# Patient Record
Sex: Female | Born: 1935 | Race: Black or African American | Hispanic: No | Smoking: Never smoker
Health system: Southern US, Community
[De-identification: ages and names within clinical notes are randomized; demographics above are authoritative.]

## PROBLEM LIST (undated history)

## (undated) DIAGNOSIS — E119 Type 2 diabetes mellitus without complications: Secondary | ICD-10-CM

## (undated) DIAGNOSIS — Z9109 Other allergy status, other than to drugs and biological substances: Secondary | ICD-10-CM

## (undated) DIAGNOSIS — I10 Essential (primary) hypertension: Secondary | ICD-10-CM

## (undated) DIAGNOSIS — J45909 Unspecified asthma, uncomplicated: Secondary | ICD-10-CM

## (undated) HISTORY — DX: Unspecified asthma, uncomplicated: J45.909

## (undated) HISTORY — DX: Type 2 diabetes mellitus without complications: E11.9

## (undated) HISTORY — PX: APPENDECTOMY: SHX54

## (undated) HISTORY — PX: TUBAL LIGATION: SHX77

## (undated) HISTORY — DX: Essential (primary) hypertension: I10

## (undated) HISTORY — DX: Other allergy status, other than to drugs and biological substances: Z91.09

---

## 2004-09-16 ENCOUNTER — Ambulatory Visit: Payer: Self-pay | Admitting: Internal Medicine

## 2004-10-09 ENCOUNTER — Ambulatory Visit: Payer: Self-pay | Admitting: Internal Medicine

## 2004-11-09 ENCOUNTER — Ambulatory Visit: Payer: Self-pay | Admitting: Internal Medicine

## 2005-01-21 ENCOUNTER — Ambulatory Visit: Payer: Self-pay | Admitting: Internal Medicine

## 2005-04-16 ENCOUNTER — Ambulatory Visit: Payer: Self-pay | Admitting: Internal Medicine

## 2005-05-11 ENCOUNTER — Ambulatory Visit: Payer: Self-pay | Admitting: Internal Medicine

## 2005-06-11 ENCOUNTER — Ambulatory Visit: Payer: Self-pay | Admitting: Internal Medicine

## 2005-07-11 ENCOUNTER — Ambulatory Visit: Payer: Self-pay | Admitting: Internal Medicine

## 2005-08-11 ENCOUNTER — Ambulatory Visit: Payer: Self-pay | Admitting: Internal Medicine

## 2005-09-11 ENCOUNTER — Ambulatory Visit: Payer: Self-pay | Admitting: Internal Medicine

## 2005-10-09 ENCOUNTER — Ambulatory Visit: Payer: Self-pay | Admitting: Internal Medicine

## 2005-11-09 ENCOUNTER — Ambulatory Visit: Payer: Self-pay | Admitting: Internal Medicine

## 2006-01-21 ENCOUNTER — Ambulatory Visit: Payer: Self-pay | Admitting: Internal Medicine

## 2006-04-22 ENCOUNTER — Ambulatory Visit: Payer: Self-pay | Admitting: Internal Medicine

## 2006-12-15 ENCOUNTER — Ambulatory Visit: Payer: Self-pay | Admitting: Internal Medicine

## 2007-01-27 ENCOUNTER — Ambulatory Visit: Payer: Self-pay | Admitting: Ophthalmology

## 2007-02-02 ENCOUNTER — Ambulatory Visit: Payer: Self-pay | Admitting: Ophthalmology

## 2007-06-24 ENCOUNTER — Ambulatory Visit: Payer: Self-pay | Admitting: Internal Medicine

## 2007-10-13 ENCOUNTER — Other Ambulatory Visit: Payer: Self-pay

## 2007-10-13 ENCOUNTER — Inpatient Hospital Stay: Payer: Self-pay | Admitting: Orthopaedic Surgery

## 2007-10-18 ENCOUNTER — Encounter: Payer: Self-pay | Admitting: Internal Medicine

## 2007-11-10 ENCOUNTER — Encounter: Payer: Self-pay | Admitting: Internal Medicine

## 2008-02-17 ENCOUNTER — Encounter: Payer: Self-pay | Admitting: Internal Medicine

## 2008-03-06 ENCOUNTER — Ambulatory Visit: Payer: Self-pay | Admitting: Vascular Surgery

## 2008-03-11 ENCOUNTER — Encounter: Payer: Self-pay | Admitting: Internal Medicine

## 2008-04-11 ENCOUNTER — Encounter: Payer: Self-pay | Admitting: Internal Medicine

## 2008-07-04 ENCOUNTER — Ambulatory Visit: Payer: Self-pay | Admitting: Internal Medicine

## 2008-10-09 ENCOUNTER — Ambulatory Visit: Payer: Self-pay | Admitting: Internal Medicine

## 2008-11-03 ENCOUNTER — Ambulatory Visit: Payer: Self-pay | Admitting: Internal Medicine

## 2008-11-09 ENCOUNTER — Ambulatory Visit: Payer: Self-pay | Admitting: Internal Medicine

## 2009-08-08 ENCOUNTER — Ambulatory Visit: Payer: Self-pay | Admitting: Internal Medicine

## 2010-03-21 ENCOUNTER — Emergency Department: Payer: Self-pay | Admitting: Emergency Medicine

## 2010-05-07 ENCOUNTER — Ambulatory Visit: Payer: Self-pay | Admitting: Internal Medicine

## 2010-09-06 ENCOUNTER — Ambulatory Visit: Payer: Self-pay | Admitting: Internal Medicine

## 2011-06-17 ENCOUNTER — Ambulatory Visit: Payer: Self-pay | Admitting: Internal Medicine

## 2011-10-23 ENCOUNTER — Ambulatory Visit: Payer: Self-pay | Admitting: Internal Medicine

## 2012-06-11 ENCOUNTER — Ambulatory Visit (INDEPENDENT_AMBULATORY_CARE_PROVIDER_SITE_OTHER): Payer: Medicare Other | Admitting: Internal Medicine

## 2012-06-11 ENCOUNTER — Encounter: Payer: Self-pay | Admitting: Internal Medicine

## 2012-06-11 VITALS — BP 101/62 | HR 68 | Temp 98.1°F | Ht <= 58 in | Wt 106.0 lb

## 2012-06-11 DIAGNOSIS — M79609 Pain in unspecified limb: Secondary | ICD-10-CM

## 2012-06-11 DIAGNOSIS — R269 Unspecified abnormalities of gait and mobility: Secondary | ICD-10-CM

## 2012-06-11 DIAGNOSIS — M79606 Pain in leg, unspecified: Secondary | ICD-10-CM

## 2012-06-11 DIAGNOSIS — R2681 Unsteadiness on feet: Secondary | ICD-10-CM

## 2012-06-11 DIAGNOSIS — E538 Deficiency of other specified B group vitamins: Secondary | ICD-10-CM

## 2012-06-11 DIAGNOSIS — I10 Essential (primary) hypertension: Secondary | ICD-10-CM | POA: Insufficient documentation

## 2012-06-11 DIAGNOSIS — D649 Anemia, unspecified: Secondary | ICD-10-CM

## 2012-06-11 DIAGNOSIS — E039 Hypothyroidism, unspecified: Secondary | ICD-10-CM

## 2012-06-11 DIAGNOSIS — E7800 Pure hypercholesterolemia, unspecified: Secondary | ICD-10-CM | POA: Insufficient documentation

## 2012-06-11 DIAGNOSIS — E1151 Type 2 diabetes mellitus with diabetic peripheral angiopathy without gangrene: Secondary | ICD-10-CM | POA: Insufficient documentation

## 2012-06-11 DIAGNOSIS — E119 Type 2 diabetes mellitus without complications: Secondary | ICD-10-CM

## 2012-06-11 LAB — CBC WITH DIFFERENTIAL/PLATELET
Basophils Absolute: 0 K/uL (ref 0.0–0.1)
Basophils Relative: 0.4 % (ref 0.0–3.0)
Eosinophils Absolute: 0.1 K/uL (ref 0.0–0.7)
Eosinophils Relative: 3.8 % (ref 0.0–5.0)
HCT: 33.8 % — ABNORMAL LOW (ref 36.0–46.0)
Hemoglobin: 10.7 g/dL — ABNORMAL LOW (ref 12.0–15.0)
Lymphocytes Relative: 30.9 % (ref 12.0–46.0)
Lymphs Abs: 1.2 K/uL (ref 0.7–4.0)
MCHC: 31.8 g/dL (ref 30.0–36.0)
MCV: 96.2 fl (ref 78.0–100.0)
Monocytes Absolute: 0.4 K/uL (ref 0.1–1.0)
Monocytes Relative: 9.4 % (ref 3.0–12.0)
Neutro Abs: 2.1 K/uL (ref 1.4–7.7)
Neutrophils Relative %: 55.5 % (ref 43.0–77.0)
Platelets: 186 K/uL (ref 150.0–400.0)
RBC: 3.51 Mil/uL — ABNORMAL LOW (ref 3.87–5.11)
RDW: 15.9 % — ABNORMAL HIGH (ref 11.5–14.6)
WBC: 3.9 K/uL — ABNORMAL LOW (ref 4.5–10.5)

## 2012-06-11 LAB — HEPATIC FUNCTION PANEL
Bilirubin, Direct: 0.1 mg/dL (ref 0.0–0.3)
Total Bilirubin: 0.4 mg/dL (ref 0.3–1.2)

## 2012-06-11 LAB — LIPID PANEL
HDL: 77.1 mg/dL (ref >39.00)
LDL Cholesterol: 104 mg/dL — ABNORMAL HIGH (ref 0–99)
Total CHOL/HDL Ratio: 3
Triglycerides: 97 mg/dL (ref 0.0–149.0)
VLDL: 19.4 mg/dL (ref 0.0–40.0)

## 2012-06-11 LAB — BASIC METABOLIC PANEL
BUN: 26 mg/dL — ABNORMAL HIGH (ref 6–23)
Creatinine, Ser: 0.9 mg/dL (ref 0.4–1.2)
GFR: 80.39 mL/min (ref >60.00)

## 2012-06-11 NOTE — Patient Instructions (Signed)
It was nice seeing you today.  I am going to schedule an appt for you to see Dr Lucky Cowboy and to go to physical therapy.  We will also check labs.  We will notify you of the results once they become available.

## 2012-06-12 ENCOUNTER — Other Ambulatory Visit: Payer: Self-pay | Admitting: Internal Medicine

## 2012-06-12 ENCOUNTER — Encounter: Payer: Self-pay | Admitting: Internal Medicine

## 2012-06-12 DIAGNOSIS — D72819 Decreased white blood cell count, unspecified: Secondary | ICD-10-CM

## 2012-06-12 DIAGNOSIS — D649 Anemia, unspecified: Secondary | ICD-10-CM

## 2012-06-12 NOTE — Progress Notes (Signed)
  Subjective:    Patient ID: Tara [name], female    DOB: [birth year], 78 y.o.   MRN: [id number]  HPI 78 year old female with past history of hypertension, diabetes and anemia who comes in today for a scheduled follow up.  She states she has been doing relatively well.  She has been having problems with increased fatigue in her legs.  Legs feel tired/weak - if on them for a while.  No numbness or tingling.  Also reports an unsteady gait.  Feels her breathing is stable.  No chest pain or tightness. No nausea or vomiting.  Sugars doing well.    Past Medical History  Diagnosis Date  . Asthma   . Diabetes mellitus   . Environmental allergies   . Hypertension     Review of Systems Patient denies any headache, lightheadedness or dizziness.  No increased sinus or allergy symptoms.   No chest pain, tightness or palpitations.  No increased shortness of breath, cough or congestion.  No nausea or vomiting.  No abdominal pain or cramping.  No bowel change, such as diarrhea, constipation, BRBPR or melana.  No urine change.        Objective:   Physical Exam Filed Vitals:   06/11/12 0819  BP: 101/62  Pulse: 68  Temp: 98.1 F (36.7 C)   Blood pressure recheck:  55/29  78 year old female in no acute distress.   HEENT:  Nares - clear.  OP- without lesions or erythema.  NECK:  Supple, nontender.  No audible bruit.   HEART:  Appears to be regular. LUNGS:  Without crackles or wheezing audible.  Respirations even and unlabored.   RADIAL PULSE:  Equal bilaterally.  ABDOMEN:  Soft, nontender.  No audible abdominal bruit.   EXTREMITIES:  No increased edema to be present.  Decreased sensation to pinprick feet (bilaterally) extending up to the mid lower leg.                     Assessment & Plan:  UNSTEADY GAIT.  Probably multifactorial.  With decreased sensation to pinprick c/w neuropathy.  Will refer to physical therapy for evaluation and treatment.

## 2012-06-12 NOTE — Assessment & Plan Note (Signed)
Blood pressure has been under good control.  Same meds.  Check met b.

## 2012-06-12 NOTE — Assessment & Plan Note (Signed)
Has been worked up by hematology.  Desires not to return to the [facility].  Check cbc today to confirm stable.

## 2012-06-12 NOTE — Assessment & Plan Note (Signed)
Low cholesterol diet.  Check lipid profile today.

## 2012-06-12 NOTE — Assessment & Plan Note (Signed)
No problems with lows.  Same meds.  Check met b and a1c.

## 2012-06-12 NOTE — Assessment & Plan Note (Signed)
B12 injection given today.  Continue monthly B12 injections.

## 2012-06-12 NOTE — Assessment & Plan Note (Signed)
Continue thyroid replacement.  Check tsh.

## 2012-06-14 ENCOUNTER — Encounter: Payer: Self-pay | Admitting: *Deleted

## 2012-06-14 NOTE — Progress Notes (Signed)
Result letter mailed to patient

## 2012-07-21 ENCOUNTER — Ambulatory Visit: Payer: Medicare Other

## 2012-07-27 ENCOUNTER — Telehealth: Payer: Self-pay | Admitting: Internal Medicine

## 2012-07-28 NOTE — Telephone Encounter (Signed)
Schedule for B12 

## 2012-08-16 ENCOUNTER — Ambulatory Visit (INDEPENDENT_AMBULATORY_CARE_PROVIDER_SITE_OTHER): Payer: Medicare Other | Admitting: Internal Medicine

## 2012-08-16 DIAGNOSIS — E538 Deficiency of other specified B group vitamins: Secondary | ICD-10-CM

## 2012-08-16 MED ORDER — CYANOCOBALAMIN 1000 MCG/ML IJ SOLN
1000.0000 ug | Freq: Once | INTRAMUSCULAR | Status: AC
  Administered 2012-08-16: 1000 ug via INTRAMUSCULAR

## 2012-08-18 NOTE — Progress Notes (Signed)
  Subjective:    Patient ID: Tara [name], female    DOB: 08-30-1917, 79 y.o.   MRN: [id number]  HPI Pt here for b12 only - was not seen for provider visit.   Review of Systems     Objective:   Physical Exam        Assessment & Plan:

## 2012-08-27 ENCOUNTER — Ambulatory Visit (INDEPENDENT_AMBULATORY_CARE_PROVIDER_SITE_OTHER): Payer: Medicare Other | Admitting: Internal Medicine

## 2012-08-27 ENCOUNTER — Encounter: Payer: Self-pay | Admitting: Internal Medicine

## 2012-08-27 VITALS — BP 162/60 | HR 72 | Temp 98.3°F | Ht <= 58 in | Wt 105.2 lb

## 2012-08-27 DIAGNOSIS — D649 Anemia, unspecified: Secondary | ICD-10-CM

## 2012-08-27 DIAGNOSIS — E119 Type 2 diabetes mellitus without complications: Secondary | ICD-10-CM

## 2012-08-27 DIAGNOSIS — I739 Peripheral vascular disease, unspecified: Secondary | ICD-10-CM

## 2012-08-27 DIAGNOSIS — E538 Deficiency of other specified B group vitamins: Secondary | ICD-10-CM

## 2012-08-27 DIAGNOSIS — I10 Essential (primary) hypertension: Secondary | ICD-10-CM

## 2012-08-27 DIAGNOSIS — E039 Hypothyroidism, unspecified: Secondary | ICD-10-CM

## 2012-08-27 DIAGNOSIS — E7800 Pure hypercholesterolemia, unspecified: Secondary | ICD-10-CM

## 2012-08-27 MED ORDER — METOPROLOL SUCCINATE ER 25 MG PO TB24: 25.0000 mg | ORAL_TABLET | Freq: Every day | ORAL | Status: DC

## 2012-08-27 MED ORDER — FLUTICASONE PROPIONATE 50 MCG/ACT NA SUSP: 2.0000 | Freq: Every day | NASAL | Status: DC

## 2012-08-29 ENCOUNTER — Encounter: Payer: Self-pay | Admitting: Internal Medicine

## 2012-08-29 DIAGNOSIS — I739 Peripheral vascular disease, unspecified: Secondary | ICD-10-CM | POA: Insufficient documentation

## 2012-08-29 NOTE — Assessment & Plan Note (Signed)
Low cholesterol diet.  Check lipid panel.

## 2012-08-29 NOTE — Assessment & Plan Note (Signed)
On synthroid

## 2012-08-29 NOTE — Assessment & Plan Note (Signed)
Blood pressure as outlined.  Same meds.  Follow metabolic panel.

## 2012-08-29 NOTE — Assessment & Plan Note (Signed)
Follow cbc.  Has been stable.

## 2012-08-29 NOTE — Assessment & Plan Note (Signed)
Sugars as outlined.  Check met b and a1c.  Follow.

## 2012-08-29 NOTE — Assessment & Plan Note (Signed)
Continue B12 injections.   

## 2012-08-29 NOTE — Progress Notes (Signed)
Subjective:    Patient ID: Tara [name], female    DOB: 08/26/1917, 79 y.o.   MRN: [id number]  HPI 79 year old female with past history of hypertension, hypercholesterolemia, diabetes mellitus and anemia. She comes in today for a scheduled follow up.  Dong relatively well.  Still having issues with her legs.  Feels as if they are going to give way.  Some discomfort.  Seeing vascular surgery.  Planning for follow up 09/02/12.  States her blood sugar has been averaging 100-130.  Breathing stable.  No significant allergy or sinus symptoms.  Bowels stable.    Past Medical History  Diagnosis Date  . Asthma   . Diabetes mellitus   . Environmental allergies   . Hypertension     Current Outpatient Prescriptions on File Prior to Visit  Medication Sig Dispense Refill  . acetaminophen (TYLENOL) 325 MG tablet Take 650 mg by mouth every 6 (six) hours as needed.      Marland Kitchen amLODipine (NORVASC) 5 MG tablet Take 5 mg by mouth daily.      Marland Kitchen aspirin 81 MG chewable tablet Chew 81 mg by mouth daily.      . Calcium Carbonate-Vitamin D (CALTRATE 600+D PO) Take by mouth.      . Cholecalciferol (D3 SUPER STRENGTH) 2000 UNITS CAPS Take by mouth.      . fexofenadine (ALLEGRA) 180 MG tablet Take 180 mg by mouth daily.      . Fluticasone-Salmeterol (ADVAIR DISKUS) 250-50 MCG/DOSE AEPB Inhale 1 puff into the lungs every 12 (twelve) hours.      . insulin glargine (LANTUS) 100 UNIT/ML injection Inject 6 Units into the skin daily.      Marland Kitchen levothyroxine (SYNTHROID, LEVOTHROID) 50 MCG tablet Take 50 mcg by mouth daily.      . metoprolol succinate (TOPROL-XL) 25 MG 24 hr tablet Take 1 tablet (25 mg total) by mouth daily.  30 tablet  5  . pioglitazone (ACTOS) 45 MG tablet Take 45 mg by mouth daily.      . fluticasone (FLONASE) 50 MCG/ACT nasal spray Place 2 sprays into the nose daily.  16 g  4    Review of Systems Patient denies any headache, lightheadedness or dizziness.  No significant sinus or allergy symptoms.  No chest  pain, tightness or palpitations.  No increased shortness of breath, cough or congestion.  Breathing stable.  Using her inhalers regularly.  No nausea or vomiting.  No abdominal pain or cramping.  No bowel change, such as diarrhea, constipation, BRBPR or melana.  No urine change.   Leg symptoms as outlined.  We discussed using a cane or walker.       Objective:   Physical Exam Filed Vitals:   08/27/12 1103  BP: 162/60  Pulse: 72  Temp: 98.3 F (36.8 C)   Blood pressure recheck:  52/2  79 year old female in no acute distress.   HEENT:  Nares - clear.  OP- without lesions or erythema.  NECK:  Supple, nontender.  No audible bruit.   HEART:  Appears to be regular. LUNGS:  Without crackles or wheezing audible.  Respirations even and unlabored.   RADIAL PULSE:  Equal bilaterally.  ABDOMEN:  Soft, nontender.  No audible abdominal bruit.   EXTREMITIES:  No increased edema to be present.  Diminished/absent pulse - DP.                  Assessment & Plan:  HEALTH MAINTENANCE.  Obtain records from outside.  Review.  Keep up to date.

## 2012-08-29 NOTE — Assessment & Plan Note (Signed)
Planning to see vascular surgery in follow up on 09/02/12.

## 2012-09-14 ENCOUNTER — Other Ambulatory Visit: Payer: Self-pay | Admitting: *Deleted

## 2012-09-14 MED ORDER — LEVOTHYROXINE SODIUM 50 MCG PO TABS: 50.0000 ug | ORAL_TABLET | Freq: Every day | ORAL | Status: DC

## 2012-10-11 ENCOUNTER — Ambulatory Visit: Payer: Medicare Other | Admitting: Internal Medicine

## 2012-10-15 ENCOUNTER — Ambulatory Visit: Payer: Medicare Other | Admitting: Internal Medicine

## 2012-10-19 ENCOUNTER — Encounter: Payer: Self-pay | Admitting: Internal Medicine

## 2012-10-19 ENCOUNTER — Ambulatory Visit (INDEPENDENT_AMBULATORY_CARE_PROVIDER_SITE_OTHER): Payer: Medicare Other | Admitting: Internal Medicine

## 2012-10-19 VITALS — BP 140/60 | HR 74 | Temp 97.9°F | Ht <= 58 in | Wt 106.5 lb

## 2012-10-19 DIAGNOSIS — I10 Essential (primary) hypertension: Secondary | ICD-10-CM

## 2012-10-19 DIAGNOSIS — E538 Deficiency of other specified B group vitamins: Secondary | ICD-10-CM

## 2012-10-19 DIAGNOSIS — E039 Hypothyroidism, unspecified: Secondary | ICD-10-CM

## 2012-10-19 DIAGNOSIS — E7800 Pure hypercholesterolemia, unspecified: Secondary | ICD-10-CM

## 2012-10-19 LAB — BASIC METABOLIC PANEL
BUN: 26 mg/dL — ABNORMAL HIGH (ref 6–23)
CO2: 27 mEq/L (ref 19–32)
Chloride: 106 mEq/L (ref 96–112)
Creatinine, Ser: 0.9 mg/dL (ref 0.4–1.2)
Glucose, Bld: 119 mg/dL — ABNORMAL HIGH (ref 70–99)

## 2012-10-19 LAB — FERRITIN: Ferritin: 100.9 ng/mL (ref 10.0–291.0)

## 2012-10-19 LAB — CBC WITH DIFFERENTIAL/PLATELET
Basophils Absolute: 0 10*3/uL (ref 0.0–0.1)
Eosinophils Absolute: 0.2 10*3/uL (ref 0.0–0.7)
Lymphocytes Relative: 42.2 % (ref 12.0–46.0)
MCHC: 33.3 g/dL (ref 30.0–36.0)
Neutrophils Relative %: 44.7 % (ref 43.0–77.0)
RDW: 16.4 % — ABNORMAL HIGH (ref 11.5–14.6)

## 2012-10-19 LAB — HEPATIC FUNCTION PANEL
ALT: 16 U/L (ref 0–35)
Bilirubin, Direct: 0 mg/dL (ref 0.0–0.3)
Total Protein: 7.1 g/dL (ref 6.0–8.3)

## 2012-10-19 LAB — LIPID PANEL
Cholesterol: 205 mg/dL — ABNORMAL HIGH (ref 0–200)
Total CHOL/HDL Ratio: 2

## 2012-10-19 LAB — HEMOGLOBIN A1C: Hgb A1c MFr Bld: 7.6 % — ABNORMAL HIGH (ref 4.6–6.5)

## 2012-10-19 LAB — LDL CHOLESTEROL, DIRECT: Direct LDL: 87.3 mg/dL

## 2012-11-03 ENCOUNTER — Ambulatory Visit (INDEPENDENT_AMBULATORY_CARE_PROVIDER_SITE_OTHER): Payer: Medicare Other | Admitting: *Deleted

## 2012-11-03 DIAGNOSIS — D649 Anemia, unspecified: Secondary | ICD-10-CM

## 2012-11-03 MED ORDER — CYANOCOBALAMIN 1000 MCG/ML IJ SOLN
1000.0000 ug | Freq: Once | INTRAMUSCULAR | Status: AC
  Administered 2012-11-03: 1000 ug via INTRAMUSCULAR

## 2012-11-15 ENCOUNTER — Other Ambulatory Visit: Payer: Self-pay | Admitting: *Deleted

## 2012-11-16 MED ORDER — PIOGLITAZONE HCL 45 MG PO TABS: 45.0000 mg | ORAL_TABLET | Freq: Every day | ORAL | Status: DC

## 2012-11-16 NOTE — Telephone Encounter (Signed)
Rx sent in to pharmacy. 

## 2012-11-23 ENCOUNTER — Other Ambulatory Visit: Payer: Self-pay | Admitting: *Deleted

## 2012-11-23 MED ORDER — INSULIN GLARGINE 100 UNIT/ML ~~LOC~~ SOLN: 6.0000 [IU] | Freq: Every day | SUBCUTANEOUS | Status: DC

## 2012-11-23 MED ORDER — AMLODIPINE BESYLATE 5 MG PO TABS: 5.0000 mg | ORAL_TABLET | Freq: Every day | ORAL | Status: DC

## 2012-11-26 ENCOUNTER — Other Ambulatory Visit: Payer: Self-pay | Admitting: Internal Medicine

## 2012-11-26 MED ORDER — AMLODIPINE BESYLATE 5 MG PO TABS: 5.0000 mg | ORAL_TABLET | Freq: Two times a day (BID) | ORAL | Status: DC

## 2012-11-26 NOTE — Progress Notes (Signed)
Sent in rx for clarification on amlodipine 5mg  bid #60 with 5 refills.

## 2012-11-28 ENCOUNTER — Encounter: Payer: Self-pay | Admitting: Internal Medicine

## 2012-11-28 NOTE — Assessment & Plan Note (Signed)
Follow cbc.  Has been stable.  Check iron studies.  She declines any further w/up or evaluation.

## 2012-11-28 NOTE — Assessment & Plan Note (Signed)
On synthroid.  Follow tsh.   

## 2012-11-28 NOTE — Progress Notes (Signed)
  Subjective:    Patient ID: Tara [name], female    DOB: 23-Sep-1917, 79 y.o.   MRN: [id number]  HPI 79 year old female with past history of hypertension, hypercholesterolemia, diabetes mellitus and anemia. She comes in today for a scheduled follow up.  Dong relatively well.  Seeing vascular surgery for her legs.  Stable.   States her blood sugar has been averaging 100-130 in the am.  Does not check in the pm.  Breathing stable.  No significant allergy or sinus symptoms.  Bowels stable.     Past Medical History  Diagnosis Date  . Asthma   . Diabetes mellitus   . Environmental allergies   . Hypertension     Current Outpatient Prescriptions on File Prior to Visit  Medication Sig Dispense Refill  . acetaminophen (TYLENOL) 325 MG tablet Take 650 mg by mouth every 6 (six) hours as needed.      Marland Kitchen aspirin 81 MG chewable tablet Chew 81 mg by mouth daily.      . Calcium Carbonate-Vitamin D (CALTRATE 600+D PO) Take by mouth.      . Cholecalciferol (D3 SUPER STRENGTH) 2000 UNITS CAPS Take by mouth.      . fexofenadine (ALLEGRA) 180 MG tablet Take 180 mg by mouth daily.      . fluticasone (FLONASE) 50 MCG/ACT nasal spray Place 2 sprays into the nose daily.  16 g  4  . Fluticasone-Salmeterol (ADVAIR DISKUS) 250-50 MCG/DOSE AEPB Inhale 1 puff into the lungs every 12 (twelve) hours.      Marland Kitchen levothyroxine (SYNTHROID, LEVOTHROID) 50 MCG tablet Take 1 tablet (50 mcg total) by mouth daily.  30 tablet  5  . metoprolol succinate (TOPROL-XL) 25 MG 24 hr tablet Take 1 tablet (25 mg total) by mouth daily.  30 tablet  5   No current facility-administered medications on file prior to visit.    Review of Systems Patient denies any headache, lightheadedness or dizziness.  No significant sinus or allergy symptoms.  No chest pain, tightness or palpitations.  No increased shortness of breath, cough or congestion.  Breathing stable.  Using her inhalers regularly.  No nausea or vomiting.  No abdominal pain or cramping.   No bowel change, such as diarrhea, constipation, BRBPR or melana.  No urine change.          Objective:   Physical Exam  Filed Vitals:   10/19/12 0922  BP: 140/60  Pulse: 74  Temp: 97.9 F (27.10 C)   79 year old female in no acute distress.   HEENT:  Nares - clear.  OP- without lesions or erythema.  NECK:  Supple, nontender.  No audible bruit.   HEART:  Appears to be regular. LUNGS:  Without crackles or wheezing audible.  Respirations even and unlabored.   RADIAL PULSE:  Equal bilaterally.  ABDOMEN:  Soft, nontender.  No audible abdominal bruit.   EXTREMITIES:  No increased edema to be present.  Diminished/absent pulse - DP.                  Assessment & Plan:  HEALTH MAINTENANCE.  Obtain records from outside.  Review.  Keep up to date.  Schedule a physical next visit.

## 2012-11-28 NOTE — Assessment & Plan Note (Signed)
Low cholesterol diet.  Check lipid panel.

## 2012-11-28 NOTE — Assessment & Plan Note (Signed)
Blood pressure as outlined.  Same meds.  Follow metabolic panel.

## 2012-11-28 NOTE — Assessment & Plan Note (Signed)
Sugars as outlined.  Check met b and a1c.  Follow.

## 2012-11-28 NOTE — Assessment & Plan Note (Signed)
Being followed by vascular surgery.  Stable.

## 2012-11-28 NOTE — Assessment & Plan Note (Signed)
Continue B12 injections.   

## 2012-12-06 ENCOUNTER — Other Ambulatory Visit: Payer: Self-pay | Admitting: Internal Medicine

## 2012-12-06 ENCOUNTER — Encounter: Payer: Self-pay | Admitting: Internal Medicine

## 2012-12-06 MED ORDER — GLUCOSE BLOOD VI STRP: ORAL_STRIP | Status: DC

## 2012-12-06 NOTE — Progress Notes (Signed)
Opened in error

## 2012-12-06 NOTE — Progress Notes (Signed)
Refilled glucose test strips #50 with 12 refills.

## 2012-12-07 ENCOUNTER — Telehealth: Payer: Self-pay | Admitting: *Deleted

## 2012-12-07 NOTE — Telephone Encounter (Signed)
RX refill was sent via fax this morning for Accu-check test strips

## 2012-12-07 NOTE — Telephone Encounter (Signed)
Refill Request  Accu-Chek Aviva Plus Tes  #50   As directed

## 2012-12-08 ENCOUNTER — Ambulatory Visit (INDEPENDENT_AMBULATORY_CARE_PROVIDER_SITE_OTHER): Payer: Medicare Other | Admitting: *Deleted

## 2012-12-08 DIAGNOSIS — E538 Deficiency of other specified B group vitamins: Secondary | ICD-10-CM

## 2012-12-08 MED ORDER — CYANOCOBALAMIN 1000 MCG/ML IJ SOLN
1000.0000 ug | Freq: Once | INTRAMUSCULAR | Status: AC
  Administered 2012-12-08: 1000 ug via INTRAMUSCULAR

## 2012-12-30 ENCOUNTER — Ambulatory Visit (INDEPENDENT_AMBULATORY_CARE_PROVIDER_SITE_OTHER): Payer: Medicare Other | Admitting: *Deleted

## 2012-12-30 DIAGNOSIS — E538 Deficiency of other specified B group vitamins: Secondary | ICD-10-CM

## 2012-12-30 MED ORDER — CYANOCOBALAMIN 1000 MCG/ML IJ SOLN
1000.0000 ug | Freq: Once | INTRAMUSCULAR | Status: AC
  Administered 2012-12-30: 1000 ug via INTRAMUSCULAR

## 2013-01-25 ENCOUNTER — Encounter: Payer: Self-pay | Admitting: Internal Medicine

## 2013-01-25 ENCOUNTER — Ambulatory Visit (INDEPENDENT_AMBULATORY_CARE_PROVIDER_SITE_OTHER): Payer: Medicare Other | Admitting: Internal Medicine

## 2013-01-25 VITALS — BP 110/50 | HR 68 | Temp 98.3°F | Ht <= 58 in | Wt 101.0 lb

## 2013-01-25 DIAGNOSIS — D649 Anemia, unspecified: Secondary | ICD-10-CM

## 2013-01-25 DIAGNOSIS — I739 Peripheral vascular disease, unspecified: Secondary | ICD-10-CM

## 2013-01-25 DIAGNOSIS — E119 Type 2 diabetes mellitus without complications: Secondary | ICD-10-CM

## 2013-01-25 DIAGNOSIS — R238 Other skin changes: Secondary | ICD-10-CM

## 2013-01-25 DIAGNOSIS — I10 Essential (primary) hypertension: Secondary | ICD-10-CM

## 2013-01-25 LAB — CBC WITH DIFFERENTIAL/PLATELET
Basophils Relative: 0.3 % (ref 0.0–3.0)
Eosinophils Relative: 2.9 % (ref 0.0–5.0)
Lymphocytes Relative: 27.8 % (ref 12.0–46.0)
MCV: 95.3 fl (ref 78.0–100.0)
Monocytes Absolute: 0.3 10*3/uL (ref 0.1–1.0)
Monocytes Relative: 7.1 % (ref 3.0–12.0)
Neutrophils Relative %: 61.9 % (ref 43.0–77.0)
Platelets: 172 10*3/uL (ref 150.0–400.0)
RBC: 3.49 Mil/uL — ABNORMAL LOW (ref 3.87–5.11)
WBC: 4.4 10*3/uL — ABNORMAL LOW (ref 4.5–10.5)

## 2013-01-25 NOTE — Assessment & Plan Note (Signed)
Blood pressure as outlined.  Same meds.  Follow metabolic panel.

## 2013-01-25 NOTE — Assessment & Plan Note (Signed)
Being followed by vascular surgery.  Stable.

## 2013-01-25 NOTE — Assessment & Plan Note (Signed)
Recheck cbc.  With the easing bruising, will check cbc, PT and PTT.

## 2013-01-25 NOTE — Progress Notes (Signed)
Subjective:    Patient ID: Tara [name], female    DOB: 05-31-1918, 79 y.o.   MRN: [id number]  Arm Injury   79 year old female with past history of hypertension, hypercholesterolemia, diabetes mellitus and anemia. She comes in today as a work in with concerns regarding some nodules and bruising she noted on her arms.   States she noticed (on her right arm) some isolated areas of fullness and then bruising with residual nodule.  No pain.  No know injury or trauma.  She noticed this am, an isolated raised area on her left forearm.  Seeing vascular surgery for her legs.  Stable.   States her blood sugar has been doing well.   Does not check in the pm.  Breathing stable.  No significant allergy or sinus symptoms.    Past Medical History  Diagnosis Date  . Asthma   . Diabetes mellitus   . Environmental allergies   . Hypertension     Current Outpatient Prescriptions on File Prior to Visit  Medication Sig Dispense Refill  . acetaminophen (TYLENOL) 325 MG tablet Take 650 mg by mouth every 6 (six) hours as needed.      Marland Kitchen amLODipine (NORVASC) 5 MG tablet Take 1 tablet (5 mg total) by mouth 2 (two) times daily.  60 tablet  5  . aspirin 81 MG chewable tablet Chew 81 mg by mouth daily.      . Calcium Carbonate-Vitamin D (CALTRATE 600+D PO) Take by mouth.      . Cholecalciferol (D3 SUPER STRENGTH) 2000 UNITS CAPS Take by mouth.      . fexofenadine (ALLEGRA) 180 MG tablet Take 180 mg by mouth daily.      . fluticasone (FLONASE) 50 MCG/ACT nasal spray Place 2 sprays into the nose daily.  16 g  4  . Fluticasone-Salmeterol (ADVAIR DISKUS) 250-50 MCG/DOSE AEPB Inhale 1 puff into the lungs every 12 (twelve) hours.      Marland Kitchen glucose blood test strip ACCU-CHEK AVIVA PLUS Check blood sugar q day Dx 250.02  50 each  12  . insulin glargine (LANTUS) 100 UNIT/ML injection Inject 0.06 mLs (6 Units total) into the skin daily.  10 mL  5  . levothyroxine (SYNTHROID, LEVOTHROID) 50 MCG tablet Take 1 tablet (50 mcg total)  by mouth daily.  30 tablet  5  . metoprolol succinate (TOPROL-XL) 25 MG 24 hr tablet Take 1 tablet (25 mg total) by mouth daily.  30 tablet  5  . pioglitazone (ACTOS) 45 MG tablet Take 1 tablet (45 mg total) by mouth daily.  30 tablet  5   No current facility-administered medications on file prior to visit.    Review of Systems Patient denies any headache, lightheadedness or dizziness.  No significant sinus or allergy symptoms.  No chest pain, tightness or palpitations.  No increased shortness of breath, cough or congestion.  Breathing stable.  No known injury or trauma to her arms.  No pain.  States the lesions on her right arm looked like the raised ?varicosity on the left - before the bruising.        Objective:   Physical Exam  Filed Vitals:   01/25/13 1043  BP: 110/50  Pulse: 68  Temp: 98.3 F (42.72 C)   79 year old female in no acute distress.   NECK:  Supple, nontender.     HEART:  Appears to be regular. LUNGS:  Without crackles or wheezing audible.  Respirations even and unlabored.   RADIAL  PULSE:  Equal bilaterally.   EXTREMITIES:  Right forearm -  Two raised bruised lesions (question of hematoma).  Question of varicosity - left fore arm.  Non tender.  No bruising - left.                  Assessment & Plan:  QUESTION OF VARICOSITY.  Exam as outlined.  No pain.  Will have vascular surgery evaluate.

## 2013-01-25 NOTE — Assessment & Plan Note (Signed)
Sugars as outlined.  Follow met b and a1c.  

## 2013-02-07 ENCOUNTER — Ambulatory Visit (INDEPENDENT_AMBULATORY_CARE_PROVIDER_SITE_OTHER): Payer: Medicare Other | Admitting: *Deleted

## 2013-02-07 DIAGNOSIS — E538 Deficiency of other specified B group vitamins: Secondary | ICD-10-CM

## 2013-02-07 MED ORDER — CYANOCOBALAMIN 1000 MCG/ML IJ SOLN
1000.0000 ug | Freq: Once | INTRAMUSCULAR | Status: AC
  Administered 2013-02-07: 1000 ug via INTRAMUSCULAR

## 2013-03-08 ENCOUNTER — Ambulatory Visit (INDEPENDENT_AMBULATORY_CARE_PROVIDER_SITE_OTHER): Payer: Medicare Other | Admitting: *Deleted

## 2013-03-08 DIAGNOSIS — E538 Deficiency of other specified B group vitamins: Secondary | ICD-10-CM

## 2013-03-08 MED ORDER — CYANOCOBALAMIN 1000 MCG/ML IJ SOLN
1000.0000 ug | Freq: Once | INTRAMUSCULAR | Status: AC
  Administered 2013-03-08: 1000 ug via INTRAMUSCULAR

## 2013-03-21 ENCOUNTER — Other Ambulatory Visit: Payer: Self-pay | Admitting: *Deleted

## 2013-03-21 MED ORDER — LEVOTHYROXINE SODIUM 50 MCG PO TABS: 50.0000 ug | ORAL_TABLET | Freq: Every day | ORAL | Status: DC

## 2013-03-23 ENCOUNTER — Telehealth: Payer: Self-pay | Admitting: *Deleted

## 2013-03-23 MED ORDER — GLUCOSE BLOOD VI STRP: ORAL_STRIP | Status: DC

## 2013-03-23 NOTE — Telephone Encounter (Signed)
done

## 2013-03-23 NOTE — Telephone Encounter (Signed)
Refill request  Accu-check Aviva plus test  Use daily as directed

## 2013-03-29 ENCOUNTER — Ambulatory Visit (INDEPENDENT_AMBULATORY_CARE_PROVIDER_SITE_OTHER): Payer: Medicare Other | Admitting: Internal Medicine

## 2013-03-29 ENCOUNTER — Encounter: Payer: Self-pay | Admitting: Internal Medicine

## 2013-03-29 VITALS — BP 110/70 | HR 68 | Temp 97.7°F | Ht <= 58 in | Wt 105.5 lb

## 2013-03-29 DIAGNOSIS — E039 Hypothyroidism, unspecified: Secondary | ICD-10-CM

## 2013-03-29 DIAGNOSIS — E119 Type 2 diabetes mellitus without complications: Secondary | ICD-10-CM

## 2013-03-29 DIAGNOSIS — D649 Anemia, unspecified: Secondary | ICD-10-CM

## 2013-03-29 DIAGNOSIS — E538 Deficiency of other specified B group vitamins: Secondary | ICD-10-CM

## 2013-03-29 DIAGNOSIS — E7800 Pure hypercholesterolemia, unspecified: Secondary | ICD-10-CM

## 2013-03-29 DIAGNOSIS — I739 Peripheral vascular disease, unspecified: Secondary | ICD-10-CM

## 2013-03-29 DIAGNOSIS — I10 Essential (primary) hypertension: Secondary | ICD-10-CM

## 2013-03-31 ENCOUNTER — Encounter: Payer: Self-pay | Admitting: Internal Medicine

## 2013-03-31 NOTE — Assessment & Plan Note (Signed)
Follow cbc.  

## 2013-03-31 NOTE — Assessment & Plan Note (Signed)
On synthroid.  Follow tsh.   

## 2013-03-31 NOTE — Progress Notes (Signed)
Subjective:    Patient ID: Tara [name], female    DOB: 04-21-1918, 79 y.o.   MRN: [id number]  HPI 79 year old female with past history of hypertension, hypercholesterolemia, diabetes mellitus and anemia. She comes in today to follow up on these issues as well as for a complete physical exam. Doing relatively well.  Seeing vascular surgery for her legs.  We discussed the possibility of another procedure.  States her blood sugar has been averaging 80s-130s in the am.  Does not check in the pm.  Breathing stable.  No significant allergy or sinus symptoms.  Bowels stable.     Past Medical History  Diagnosis Date  . Asthma   . Diabetes mellitus   . Environmental allergies   . Hypertension     Current Outpatient Prescriptions on File Prior to Visit  Medication Sig Dispense Refill  . acetaminophen (TYLENOL) 325 MG tablet Take 650 mg by mouth every 6 (six) hours as needed.      Marland Kitchen amLODipine (NORVASC) 5 MG tablet Take 1 tablet (5 mg total) by mouth 2 (two) times daily.  60 tablet  5  . aspirin 81 MG chewable tablet Chew 81 mg by mouth daily.      . Calcium Carbonate-Vitamin D (CALTRATE 600+D PO) Take by mouth.      . Cholecalciferol (D3 SUPER STRENGTH) 2000 UNITS CAPS Take by mouth.      . fexofenadine (ALLEGRA) 180 MG tablet Take 180 mg by mouth daily.      . fluticasone (FLONASE) 50 MCG/ACT nasal spray Place 2 sprays into the nose daily.  16 g  4  . glucose blood test strip ACCU-CHEK AVIVA PLUS-Check blood sugar q day (Dx 250.02)  50 each  12  . insulin glargine (LANTUS) 100 UNIT/ML injection Inject 0.06 mLs (6 Units total) into the skin daily.  10 mL  5  . levothyroxine (SYNTHROID, LEVOTHROID) 50 MCG tablet Take 1 tablet (50 mcg total) by mouth daily.  30 tablet  5  . metoprolol succinate (TOPROL-XL) 25 MG 24 hr tablet Take 1 tablet (25 mg total) by mouth daily.  30 tablet  5  . pioglitazone (ACTOS) 45 MG tablet Take 1 tablet (45 mg total) by mouth daily.  30 tablet  5   No current  facility-administered medications on file prior to visit.    Review of Systems Patient denies any headache, lightheadedness or dizziness.  No significant sinus or allergy symptoms.  No chest pain, tightness or palpitations.  No increased shortness of breath, cough or congestion.  Breathing stable.  Using her inhalers now as needed.  No nausea or vomiting.  No abdominal pain or cramping.  No bowel change, such as diarrhea, constipation, BRBPR or melana.  No urine change.   Sugars attached.         Objective:   Physical Exam  Filed Vitals:   03/29/13 1614  BP: 110/70  Pulse: 68  Temp: 97.7 F (36.5 C)   Blood pressure recheck:  31/63  79 year old female in no acute distress.   HEENT:  Nares- clear.  Oropharynx - without lesions. NECK:  Supple.  Nontender.  No audible bruit.  HEART:  Appears to be regular. LUNGS:  No crackles or wheezing audible.  Respirations even and unlabored.  RADIAL PULSE:  Equal bilaterally.    BREASTS:  No nipple discharge or nipple retraction present.  Could not appreciate any distinct nodules or axillary adenopathy.  ABDOMEN:  Soft, nontender.  Bowel sounds  present and normal.  No audible abdominal bruit.  GU: not performed.    EXTREMITIES:  No increased edema present.  DP pulses palpable and equal bilaterally.   FEET:  Without lesions.                  Assessment & Plan:  HEALTH MAINTENANCE.  Physical today.  Declines mammograms.  Desires no further testing.

## 2013-03-31 NOTE — Assessment & Plan Note (Signed)
Blood pressure as outlined.  Same meds.  Follow metabolic panel.

## 2013-03-31 NOTE — Assessment & Plan Note (Signed)
Continue B12 injections.   

## 2013-03-31 NOTE — Assessment & Plan Note (Signed)
Being followed by vascular surgery.  Discussed possibility of upcoming surgery/procedure.  Vascular is wanting to do another intervention.  Follow.  She will let me know what she decides.

## 2013-03-31 NOTE — Assessment & Plan Note (Signed)
Low cholesterol diet.  Follow lipid panel.    

## 2013-03-31 NOTE — Assessment & Plan Note (Signed)
Sugars as outlined.  Follow met b and a1c.  

## 2013-04-12 ENCOUNTER — Ambulatory Visit (INDEPENDENT_AMBULATORY_CARE_PROVIDER_SITE_OTHER): Payer: Medicare Other | Admitting: *Deleted

## 2013-04-12 DIAGNOSIS — E538 Deficiency of other specified B group vitamins: Secondary | ICD-10-CM

## 2013-04-12 MED ORDER — CYANOCOBALAMIN 1000 MCG/ML IJ SOLN
1000.0000 ug | Freq: Once | INTRAMUSCULAR | Status: AC
  Administered 2013-04-12: 1000 ug via INTRAMUSCULAR

## 2013-05-02 ENCOUNTER — Emergency Department: Payer: Self-pay | Admitting: Emergency Medicine

## 2013-05-05 ENCOUNTER — Telehealth: Payer: Self-pay | Admitting: *Deleted

## 2013-05-05 NOTE — Telephone Encounter (Signed)
If pt has had a hip fracture, I do want her to be followed by ortho (even if no surgery warranted).  Did they give her anything for pain in the ER.  I would recommend tylenol ES 2 tablets tid for now.  If agreeable for referral, let me know and I will place order.

## 2013-05-05 NOTE — Telephone Encounter (Signed)
Pt called to report that she fell recently and broke her hip (Records requested). She wanted to know if she could get some medication for pain. She was told at [facility] that she just needs to stay off of it & rest.

## 2013-05-06 ENCOUNTER — Telehealth: Payer: Self-pay | Admitting: Internal Medicine

## 2013-05-06 NOTE — Telephone Encounter (Signed)
Pt called back to let me know that the ER did not give her any pain meds. I instructed her to Tylenol ES 2 tab TID. Pt also willing to proceed with Ortho referral. (Copy of records placed in Ambers basket)

## 2013-05-06 NOTE — Telephone Encounter (Signed)
Fractured pelvis, ER visit Monday.  Asking if she can get any pain medication.  States was not prescribed any medication from [facility].

## 2013-05-06 NOTE — Telephone Encounter (Signed)
Patient daughter and patient are aware of the apt.

## 2013-05-06 NOTE — Telephone Encounter (Signed)
Patient has an apt with [facility] on 05/09/13 @ 130 pm. Arriving @ 115.

## 2013-05-06 NOTE — Telephone Encounter (Signed)
See other message

## 2013-05-07 ENCOUNTER — Emergency Department: Payer: Self-pay | Admitting: Emergency Medicine

## 2013-05-07 LAB — CBC
HCT: 34.1 % — ABNORMAL LOW (ref 35.0–47.0)
HGB: 11.8 g/dL — ABNORMAL LOW (ref 12.0–16.0)
MCH: 32.2 pg (ref 26.0–34.0)
MCHC: 34.5 g/dL (ref 32.0–36.0)
MCV: 93 fL (ref 80–100)
RDW: 15.9 % — ABNORMAL HIGH (ref 11.5–14.5)

## 2013-05-07 LAB — COMPREHENSIVE METABOLIC PANEL
Albumin: 3.2 g/dL — ABNORMAL LOW (ref 3.4–5.0)
Alkaline Phosphatase: 45 U/L — ABNORMAL LOW (ref 50–136)
BUN: 35 mg/dL — ABNORMAL HIGH (ref 7–18)
Bilirubin,Total: 0.2 mg/dL (ref 0.2–1.0)
Calcium, Total: 9.8 mg/dL (ref 8.5–10.1)
Chloride: 108 mmol/L — ABNORMAL HIGH (ref 98–107)
Co2: 27 mmol/L (ref 21–32)
EGFR (African American): 52 — ABNORMAL LOW
Glucose: 140 mg/dL — ABNORMAL HIGH (ref 65–99)
Osmolality: 290 (ref 275–301)
SGOT(AST): 25 U/L (ref 15–37)
SGPT (ALT): 19 U/L (ref 12–78)
Total Protein: 7.8 g/dL (ref 6.4–8.2)

## 2013-05-12 ENCOUNTER — Ambulatory Visit: Payer: Self-pay | Admitting: Internal Medicine

## 2013-05-12 ENCOUNTER — Ambulatory Visit (INDEPENDENT_AMBULATORY_CARE_PROVIDER_SITE_OTHER): Payer: Medicare Other | Admitting: Internal Medicine

## 2013-05-12 ENCOUNTER — Encounter: Payer: Self-pay | Admitting: Internal Medicine

## 2013-05-12 ENCOUNTER — Telehealth: Payer: Self-pay | Admitting: *Deleted

## 2013-05-12 ENCOUNTER — Ambulatory Visit (INDEPENDENT_AMBULATORY_CARE_PROVIDER_SITE_OTHER): Payer: Medicare Other | Admitting: *Deleted

## 2013-05-12 VITALS — BP 110/70 | HR 72 | Temp 97.8°F | Ht <= 58 in | Wt 106.0 lb

## 2013-05-12 DIAGNOSIS — Z23 Encounter for immunization: Secondary | ICD-10-CM

## 2013-05-12 DIAGNOSIS — I739 Peripheral vascular disease, unspecified: Secondary | ICD-10-CM

## 2013-05-12 DIAGNOSIS — E119 Type 2 diabetes mellitus without complications: Secondary | ICD-10-CM

## 2013-05-12 DIAGNOSIS — E538 Deficiency of other specified B group vitamins: Secondary | ICD-10-CM

## 2013-05-12 DIAGNOSIS — R600 Localized edema: Secondary | ICD-10-CM

## 2013-05-12 DIAGNOSIS — R609 Edema, unspecified: Secondary | ICD-10-CM

## 2013-05-12 DIAGNOSIS — I10 Essential (primary) hypertension: Secondary | ICD-10-CM

## 2013-05-12 MED ORDER — CYANOCOBALAMIN 1000 MCG/ML IJ SOLN
1000.0000 ug | Freq: Once | INTRAMUSCULAR | Status: AC
  Administered 2013-05-12: 1000 ug via INTRAMUSCULAR

## 2013-05-12 NOTE — Telephone Encounter (Signed)
Pt's daughter notified of ultrasound negative.  Elevate legs and compression hose as discussed at appt.

## 2013-05-12 NOTE — Telephone Encounter (Signed)
Ultrasound: Negative for DVT of the left leg (pt was released)

## 2013-05-15 ENCOUNTER — Encounter: Payer: Self-pay | Admitting: Internal Medicine

## 2013-05-15 DIAGNOSIS — R600 Localized edema: Secondary | ICD-10-CM | POA: Insufficient documentation

## 2013-05-15 NOTE — Progress Notes (Signed)
Subjective:    Patient ID: Tara [name], female    DOB: Mar 22, 1918, 79 y.o.   MRN: [id number]  HPI 79 year old female with past history of hypertension, hypercholesterolemia, diabetes mellitus and anemia. She comes in today as a work in.   States that approximately 10 days ago she fell.  Hit her head and subsequently found to have a pelvic fracture.  Had head scan - negative.  Has seen ortho.  Using a walker.  Is some better.  Seeing vascular surgery for her legs.   Brought in recorded sugar readings and am sugars averaging 85-130.  Does not check in the pm.  Breathing stable.  No significant allergy or sinus symptoms.  Bowels stable.  She does report some lower extremity swelling.  Left leg worse than right.     Past Medical History  Diagnosis Date  . Asthma   . Diabetes mellitus   . Environmental allergies   . Hypertension     Current Outpatient Prescriptions on File Prior to Visit  Medication Sig Dispense Refill  . acetaminophen (TYLENOL) 325 MG tablet Take 650 mg by mouth every 6 (six) hours as needed.      Marland Kitchen amLODipine (NORVASC) 5 MG tablet Take 1 tablet (5 mg total) by mouth 2 (two) times daily.  60 tablet  5  . aspirin 81 MG chewable tablet Chew 81 mg by mouth daily.      . Calcium Carbonate-Vitamin D (CALTRATE 600+D PO) Take by mouth.      . Cholecalciferol (D3 SUPER STRENGTH) 2000 UNITS CAPS Take by mouth.      . fexofenadine (ALLEGRA) 180 MG tablet Take 180 mg by mouth daily.      . fluticasone (FLONASE) 50 MCG/ACT nasal spray Place 2 sprays into the nose daily.  16 g  4  . glucose blood test strip ACCU-CHEK AVIVA PLUS-Check blood sugar q day (Dx 250.02)  50 each  12  . insulin glargine (LANTUS) 100 UNIT/ML injection Inject 0.06 mLs (6 Units total) into the skin daily.  10 mL  5  . levothyroxine (SYNTHROID, LEVOTHROID) 50 MCG tablet Take 1 tablet (50 mcg total) by mouth daily.  30 tablet  5  . metoprolol succinate (TOPROL-XL) 25 MG 24 hr tablet Take 1 tablet (25 mg total) by  mouth daily.  30 tablet  5  . pioglitazone (ACTOS) 45 MG tablet Take 1 tablet (45 mg total) by mouth daily.  30 tablet  5   No current facility-administered medications on file prior to visit.    Review of Systems Patient denies any headache, lightheadedness or dizziness.  No significant sinus or allergy symptoms.  No chest pain, tightness or palpitations.  No increased shortness of breath, cough or congestion.  Breathing stable.  Using her inhalers now as needed.  No nausea or vomiting.  No abdominal pain or cramping.  No bowel change, such as diarrhea, constipation, BRBPR or melana.  No urine change.   Sugars attached.         Objective:   Physical Exam  Filed Vitals:   05/12/13 1442  BP: 110/70  Pulse: 72  Temp: 97.8 F (38.72 C)   79 year old female in no acute distress.  NECK:  Supple.  Nontender.  No audible bruit.  HEART:  Appears to be regular. LUNGS:  No crackles or wheezing audible.  Respirations even and unlabored.  RADIAL PULSE:  Equal bilaterally.   ABDOMEN:  Soft, nontender.  Bowel sounds present and normal.  No audible abdominal bruit.    EXTREMITIES:  Increased pedal and lower extremity swelling - left greater than right.   No increased erythema and warmth.    FEET:  Without lesions.                  Assessment & Plan:  HEALTH MAINTENANCE.  Physical last visit.  Declines mammograms.  Desires no further testing.

## 2013-05-15 NOTE — Assessment & Plan Note (Signed)
Being followed by vascular surgery.  Follow.

## 2013-05-15 NOTE — Assessment & Plan Note (Signed)
Lower extremity edema.  Left greater than right.  Check lower extremity ultrasound.  Confirm no DVT.  If negative, will need leg elevation and compression hose.  Follow.

## 2013-05-15 NOTE — Assessment & Plan Note (Signed)
Blood pressure as outlined.  Same meds.  Follow metabolic panel.

## 2013-05-15 NOTE — Assessment & Plan Note (Signed)
Sugars as outlined.  Follow met b and a1c.  

## 2013-05-17 ENCOUNTER — Other Ambulatory Visit: Payer: Self-pay | Admitting: *Deleted

## 2013-05-17 MED ORDER — METOPROLOL SUCCINATE ER 25 MG PO TB24: 25.0000 mg | ORAL_TABLET | Freq: Every day | ORAL | Status: DC

## 2013-05-23 ENCOUNTER — Other Ambulatory Visit: Payer: Self-pay | Admitting: *Deleted

## 2013-05-23 MED ORDER — AMLODIPINE BESYLATE 5 MG PO TABS: 5.0000 mg | ORAL_TABLET | Freq: Two times a day (BID) | ORAL | Status: DC

## 2013-05-29 ENCOUNTER — Other Ambulatory Visit: Payer: Self-pay | Admitting: Internal Medicine

## 2013-05-29 MED ORDER — AMLODIPINE BESYLATE 5 MG PO TABS: 5.0000 mg | ORAL_TABLET | Freq: Two times a day (BID) | ORAL | Status: DC

## 2013-05-29 NOTE — Progress Notes (Signed)
Refilled amlodipine #60 with 5 refills.

## 2013-06-15 ENCOUNTER — Ambulatory Visit: Payer: Medicare Other

## 2013-06-27 ENCOUNTER — Ambulatory Visit (INDEPENDENT_AMBULATORY_CARE_PROVIDER_SITE_OTHER): Payer: Medicare Other | Admitting: *Deleted

## 2013-06-27 DIAGNOSIS — E538 Deficiency of other specified B group vitamins: Secondary | ICD-10-CM

## 2013-06-27 MED ORDER — CYANOCOBALAMIN 1000 MCG/ML IJ SOLN
1000.0000 ug | Freq: Once | INTRAMUSCULAR | Status: AC
  Administered 2013-06-27: 1000 ug via INTRAMUSCULAR

## 2013-07-27 ENCOUNTER — Telehealth: Payer: Self-pay | Admitting: Internal Medicine

## 2013-07-27 ENCOUNTER — Telehealth: Payer: Self-pay | Admitting: *Deleted

## 2013-07-27 ENCOUNTER — Other Ambulatory Visit: Payer: Self-pay | Admitting: Internal Medicine

## 2013-07-27 DIAGNOSIS — M79673 Pain in unspecified foot: Secondary | ICD-10-CM

## 2013-07-27 NOTE — Telephone Encounter (Signed)
To avoid two visits, since increased pain and injury - I would recommend podiatry evaluation.  If agreeable, let me know and I will see if amber can get her an appt today.

## 2013-07-27 NOTE — Telephone Encounter (Signed)
Pt injured foot.  Increased pain.  Concern over fracture.  Would like to see if podiatry can see her today.  Daughter can bring her today.  Will place order for referral.

## 2013-07-27 NOTE — Progress Notes (Signed)
Order placed for podiatry referral.   

## 2013-07-27 NOTE — Telephone Encounter (Signed)
Pt seeing [place] @ 215

## 2013-07-27 NOTE — Telephone Encounter (Signed)
Injured her foot on Monday after she fell. In a lot of pain. Wants to be sure that she did not break her toe. Granddaughter states that she has an appt on Friday but wanted to know if she could be soon earlier? She did not go to the ER & she has not seen anyone about her toe. Please advise

## 2013-07-27 NOTE — Telephone Encounter (Signed)
Noted  

## 2013-07-27 NOTE — Telephone Encounter (Signed)
Pt would like to proceed with podiatry referral & hopefully be seen today

## 2013-07-27 NOTE — Telephone Encounter (Signed)
Spoke with granddaughter & informed her that she needs to see Podiatry for evaluation. She will let Kayley know & call us back to let us know if she would like to proceed.

## 2013-07-29 ENCOUNTER — Encounter: Payer: Self-pay | Admitting: Internal Medicine

## 2013-07-29 ENCOUNTER — Ambulatory Visit (INDEPENDENT_AMBULATORY_CARE_PROVIDER_SITE_OTHER)
Admission: RE | Admit: 2013-07-29 | Discharge: 2013-07-29 | Disposition: A | Discharge status: Home / Self Care | Payer: Medicare Other | Source: Ambulatory Visit | Attending: Internal Medicine | Admitting: Internal Medicine

## 2013-07-29 ENCOUNTER — Ambulatory Visit (INDEPENDENT_AMBULATORY_CARE_PROVIDER_SITE_OTHER): Payer: Medicare Other | Admitting: Internal Medicine

## 2013-07-29 VITALS — BP 130/60 | HR 88 | Temp 99.1°F | Ht <= 58 in

## 2013-07-29 DIAGNOSIS — R269 Unspecified abnormalities of gait and mobility: Secondary | ICD-10-CM

## 2013-07-29 DIAGNOSIS — M79672 Pain in left foot: Secondary | ICD-10-CM

## 2013-07-29 DIAGNOSIS — I739 Peripheral vascular disease, unspecified: Secondary | ICD-10-CM

## 2013-07-29 DIAGNOSIS — E119 Type 2 diabetes mellitus without complications: Secondary | ICD-10-CM

## 2013-07-29 DIAGNOSIS — I10 Essential (primary) hypertension: Secondary | ICD-10-CM

## 2013-07-29 DIAGNOSIS — E039 Hypothyroidism, unspecified: Secondary | ICD-10-CM

## 2013-07-29 DIAGNOSIS — D649 Anemia, unspecified: Secondary | ICD-10-CM

## 2013-07-29 DIAGNOSIS — R2681 Unsteadiness on feet: Secondary | ICD-10-CM

## 2013-07-29 DIAGNOSIS — M549 Dorsalgia, unspecified: Secondary | ICD-10-CM

## 2013-07-29 DIAGNOSIS — R609 Edema, unspecified: Secondary | ICD-10-CM

## 2013-07-29 DIAGNOSIS — E538 Deficiency of other specified B group vitamins: Secondary | ICD-10-CM

## 2013-07-29 DIAGNOSIS — R600 Localized edema: Secondary | ICD-10-CM

## 2013-07-29 DIAGNOSIS — M79609 Pain in unspecified limb: Secondary | ICD-10-CM

## 2013-07-29 DIAGNOSIS — E7800 Pure hypercholesterolemia, unspecified: Secondary | ICD-10-CM

## 2013-07-29 LAB — URINALYSIS, ROUTINE W REFLEX MICROSCOPIC
Bilirubin Urine: NEGATIVE
Ketones, ur: NEGATIVE
Nitrite: NEGATIVE
Specific Gravity, Urine: 1.025 (ref 1.000–1.030)
Total Protein, Urine: 100 — AB
Urobilinogen, UA: 0.2 (ref 0.0–1.0)
pH: 5.5 (ref 5.0–8.0)

## 2013-07-29 LAB — HEPATIC FUNCTION PANEL
AST: 24 U/L (ref 0–37)
Albumin: 3.6 g/dL (ref 3.5–5.2)
Alkaline Phosphatase: 40 U/L (ref 39–117)
Total Protein: 6.7 g/dL (ref 6.0–8.3)

## 2013-07-29 LAB — BASIC METABOLIC PANEL
BUN: 25 mg/dL — ABNORMAL HIGH (ref 6–23)
CO2: 27 mEq/L (ref 19–32)
Calcium: 8.9 mg/dL (ref 8.4–10.5)
Creatinine, Ser: 0.8 mg/dL (ref 0.4–1.2)
GFR: 84.78 mL/min (ref >60.00)
Glucose, Bld: 255 mg/dL — ABNORMAL HIGH (ref 70–99)

## 2013-07-29 LAB — CBC WITH DIFFERENTIAL/PLATELET
Basophils Absolute: 0 10*3/uL (ref 0.0–0.1)
Basophils Relative: 0.4 % (ref 0.0–3.0)
Eosinophils Relative: 0.9 % (ref 0.0–5.0)
HCT: 32.8 % — ABNORMAL LOW (ref 36.0–46.0)
Hemoglobin: 11 g/dL — ABNORMAL LOW (ref 12.0–15.0)
Lymphs Abs: 1.6 10*3/uL (ref 0.7–4.0)
MCV: 92.9 fl (ref 78.0–100.0)
Monocytes Absolute: 0.5 10*3/uL (ref 0.1–1.0)
Monocytes Relative: 6.1 % (ref 3.0–12.0)
RBC: 3.53 Mil/uL — ABNORMAL LOW (ref 3.87–5.11)
WBC: 7.7 10*3/uL (ref 4.5–10.5)

## 2013-07-29 LAB — LIPID PANEL
HDL: 95.8 mg/dL (ref >39.00)
Total CHOL/HDL Ratio: 2

## 2013-07-29 LAB — MICROALBUMIN / CREATININE URINE RATIO: Microalb, Ur: 115 mg/dL — ABNORMAL HIGH (ref 0.0–1.9)

## 2013-07-29 LAB — LDL CHOLESTEROL, DIRECT: Direct LDL: 107.1 mg/dL

## 2013-07-29 MED ORDER — CYANOCOBALAMIN 1000 MCG/ML IJ SOLN
1000.0000 ug | Freq: Once | INTRAMUSCULAR | Status: AC
  Administered 2013-07-29: 1000 ug via INTRAMUSCULAR

## 2013-07-29 NOTE — Progress Notes (Signed)
Pre-visit discussion using our clinic review tool. No additional management support is needed unless otherwise documented below in the visit note.  

## 2013-07-31 ENCOUNTER — Encounter: Payer: Self-pay | Admitting: Internal Medicine

## 2013-07-31 DIAGNOSIS — M79672 Pain in left foot: Secondary | ICD-10-CM | POA: Insufficient documentation

## 2013-07-31 DIAGNOSIS — R2681 Unsteadiness on feet: Secondary | ICD-10-CM | POA: Insufficient documentation

## 2013-07-31 LAB — CULTURE, URINE COMPREHENSIVE
Colony Count: NO GROWTH
Organism ID, Bacteria: NO GROWTH

## 2013-07-31 NOTE — Assessment & Plan Note (Signed)
On synthroid.  Follow tsh.   

## 2013-07-31 NOTE — Assessment & Plan Note (Signed)
Follow met b and a1c.  

## 2013-07-31 NOTE — Progress Notes (Signed)
Subjective:    Patient ID: Tara [name], female    DOB: [birth year], 79 y.o.   MRN: [id number]  HPI 79 year old female with past history of hypertension, hypercholesterolemia, diabetes mellitus and anemia. She comes in today for a scheduled follow up.  Recently diagnosed with a pelvic fracture.  Saw ortho.  Using a walker.  Is better.  Seeing vascular surgery for her legs.   Brought in recorded sugar readings.  AM sugars have been controlled.  Does not check in the pm.  Breathing stable.  No significant allergy or sinus symptoms.  Bowels stable.  She does report some lower extremity swelling.  Left leg worse than right.  She has injured her left foot.  Saw podiatry two days ago.  Xray negative.  Swelling has improved.  Wearing a post op shoe.  She is having problems with her legs.  Feel as if they are going to give way at times.  Gait is unsteady.     Past Medical History  Diagnosis Date  . Asthma   . Diabetes mellitus   . Environmental allergies   . Hypertension     Current Outpatient Prescriptions on File Prior to Visit  Medication Sig Dispense Refill  . acetaminophen (TYLENOL) 325 MG tablet Take 650 mg by mouth every 6 (six) hours as needed.      Marland Kitchen amLODipine (NORVASC) 5 MG tablet Take 1 tablet (5 mg total) by mouth 2 (two) times daily.  60 tablet  5  . aspirin 81 MG chewable tablet Chew 81 mg by mouth daily.      . Calcium Carbonate-Vitamin D (CALTRATE 600+D PO) Take by mouth.      . Cholecalciferol (D3 SUPER STRENGTH) 2000 UNITS CAPS Take by mouth.      . famciclovir (FAMVIR) 500 MG tablet Take 500 mg by mouth daily.      . fexofenadine (ALLEGRA) 180 MG tablet Take 180 mg by mouth daily.      . fluticasone (FLONASE) 50 MCG/ACT nasal spray Place 2 sprays into the nose daily.  16 g  4  . glucose blood test strip ACCU-CHEK AVIVA PLUS-Check blood sugar q day (Dx 250.02)  50 each  12  . HYDROcodone-acetaminophen (NORCO/VICODIN) 5-325 MG per tablet Take 1 tablet by mouth every 6 (six) hours  as needed for pain.      Marland Kitchen insulin glargine (LANTUS) 100 UNIT/ML injection Inject 0.06 mLs (6 Units total) into the skin daily.  10 mL  5  . levothyroxine (SYNTHROID, LEVOTHROID) 50 MCG tablet Take 1 tablet (50 mcg total) by mouth daily.  30 tablet  5  . metoprolol succinate (TOPROL-XL) 25 MG 24 hr tablet Take 1 tablet (25 mg total) by mouth daily.  30 tablet  5  . pioglitazone (ACTOS) 45 MG tablet Take 1 tablet (45 mg total) by mouth daily.  30 tablet  5   No current facility-administered medications on file prior to visit.    Review of Systems Patient denies any headache, lightheadedness or dizziness.  No significant sinus or allergy symptoms.  No chest pain, tightness or palpitations.  No increased shortness of breath, cough or congestion.  Breathing stable.  Using her inhalers now as needed.  No nausea or vomiting.  No abdominal pain or cramping.  No bowel change, such as diarrhea, constipation, BRBPR or melana.  No urine change.  Gait as outlined.  Foot pain.  Saw podiatry.  Swelling improved from yesterday.  Still swollen.  Objective:   Physical Exam  Filed Vitals:   07/29/13 1408  BP: 130/60  Pulse: 88  Temp: 99.1 F (63.11 C)   79 year old female in no acute distress.  HEENT:  Nares clear.  Oropharynx - without lesions.   NECK:  Supple.  Nontender.  No audible bruit.  HEART:  Appears to be regular. LUNGS:  No crackles or wheezing audible.  Respirations even and unlabored.  RADIAL PULSE:  Equal bilaterally.   ABDOMEN:  Soft, nontender.  Bowel sounds present and normal.  No audible abdominal bruit.    EXTREMITIES:  Increased pedal and lower extremity swelling - left greater than right.   No increased erythema and warmth.    FEET:  Increased pain left foot.                   Assessment & Plan:  HEALTH MAINTENANCE.  Declines mammograms.  Desires no further testing.

## 2013-07-31 NOTE — Assessment & Plan Note (Signed)
Low cholesterol diet.  Follow lipid panel.    

## 2013-07-31 NOTE — Assessment & Plan Note (Addendum)
Difficulty ambulating at times.  Has a walker.  Unsteady gait.  Refer to physical therapy for gait training and strengthening (core strengthening and leg strengthening).  Check back xray.

## 2013-07-31 NOTE — Assessment & Plan Note (Signed)
Being followed by vascular surgery.  Follow.

## 2013-07-31 NOTE — Assessment & Plan Note (Signed)
Pain s/p injury.  Saw podiatry.  Had xray.  No fracture.  Wearing post op shoe.  Follow.

## 2013-07-31 NOTE — Assessment & Plan Note (Signed)
Continue B12 injections.   

## 2013-07-31 NOTE — Assessment & Plan Note (Signed)
Lower extremity edema.  Left greater than right.  Continues to follow up with vascular surgery.

## 2013-07-31 NOTE — Assessment & Plan Note (Signed)
Follow cbc.  

## 2013-07-31 NOTE — Assessment & Plan Note (Signed)
Blood pressure as outlined.  Same meds.  Follow metabolic panel.

## 2013-08-31 ENCOUNTER — Ambulatory Visit (INDEPENDENT_AMBULATORY_CARE_PROVIDER_SITE_OTHER): Payer: Medicare Other | Admitting: *Deleted

## 2013-08-31 DIAGNOSIS — E538 Deficiency of other specified B group vitamins: Secondary | ICD-10-CM

## 2013-08-31 MED ORDER — CYANOCOBALAMIN 1000 MCG/ML IJ SOLN
1000.0000 ug | Freq: Once | INTRAMUSCULAR | Status: AC
  Administered 2013-08-31: 1000 ug via INTRAMUSCULAR

## 2013-09-19 ENCOUNTER — Other Ambulatory Visit: Payer: Self-pay | Admitting: Internal Medicine

## 2013-09-20 ENCOUNTER — Encounter: Payer: Self-pay | Admitting: Internal Medicine

## 2013-09-20 ENCOUNTER — Ambulatory Visit (INDEPENDENT_AMBULATORY_CARE_PROVIDER_SITE_OTHER): Payer: Medicare Other | Admitting: Internal Medicine

## 2013-09-20 VITALS — BP 110/70 | HR 70 | Temp 97.5°F | Ht <= 58 in | Wt 110.0 lb

## 2013-09-20 DIAGNOSIS — M79672 Pain in left foot: Secondary | ICD-10-CM

## 2013-09-20 DIAGNOSIS — R609 Edema, unspecified: Secondary | ICD-10-CM

## 2013-09-20 DIAGNOSIS — I739 Peripheral vascular disease, unspecified: Secondary | ICD-10-CM

## 2013-09-20 DIAGNOSIS — E538 Deficiency of other specified B group vitamins: Secondary | ICD-10-CM

## 2013-09-20 DIAGNOSIS — E119 Type 2 diabetes mellitus without complications: Secondary | ICD-10-CM

## 2013-09-20 DIAGNOSIS — E039 Hypothyroidism, unspecified: Secondary | ICD-10-CM

## 2013-09-20 DIAGNOSIS — D649 Anemia, unspecified: Secondary | ICD-10-CM

## 2013-09-20 DIAGNOSIS — R600 Localized edema: Secondary | ICD-10-CM

## 2013-09-20 DIAGNOSIS — E7800 Pure hypercholesterolemia, unspecified: Secondary | ICD-10-CM

## 2013-09-20 DIAGNOSIS — I10 Essential (primary) hypertension: Secondary | ICD-10-CM

## 2013-09-20 DIAGNOSIS — M79609 Pain in unspecified limb: Secondary | ICD-10-CM

## 2013-09-20 MED ORDER — PIOGLITAZONE HCL 30 MG PO TABS: 30.0000 mg | ORAL_TABLET | Freq: Every day | ORAL | Status: DC

## 2013-09-20 NOTE — Progress Notes (Signed)
Pre-visit discussion using our clinic review tool. No additional management support is needed unless otherwise documented below in the visit note.  

## 2013-09-20 NOTE — Progress Notes (Signed)
Subjective:    Patient ID: Tara [name], female    DOB: Mar 10, 1918, 80 y.o.   MRN: [id number]  HPI 80 year old female with past history of hypertension, hypercholesterolemia, diabetes mellitus and anemia. She comes in today for a scheduled follow up.  Recently diagnosed with a pelvic fracture.  Saw ortho.  Was using a walker.  Is better now.  Seeing vascular surgery for her legs.   Brought in recorded sugar readings.  Sugars appear to be under reasonable control.   Breathing stable.  No significant allergy or sinus symptoms.  Bowels stable.   Swelling has improved.  She is having problems with her legs.  Stable.      Past Medical History  Diagnosis Date  . Asthma   . Diabetes mellitus   . Environmental allergies   . Hypertension     Current Outpatient Prescriptions on File Prior to Visit  Medication Sig Dispense Refill  . acetaminophen (TYLENOL) 325 MG tablet Take 650 mg by mouth every 6 (six) hours as needed.      Marland Kitchen amLODipine (NORVASC) 5 MG tablet Take 1 tablet (5 mg total) by mouth 2 (two) times daily.  60 tablet  5  . aspirin 81 MG chewable tablet Chew 81 mg by mouth daily.      . Calcium Carbonate-Vitamin D (CALTRATE 600+D PO) Take by mouth.      . Cholecalciferol (D3 SUPER STRENGTH) 2000 UNITS CAPS Take by mouth.      . famciclovir (FAMVIR) 500 MG tablet Take 500 mg by mouth daily.      . fexofenadine (ALLEGRA) 180 MG tablet Take 180 mg by mouth daily.      . fluticasone (FLONASE) 50 MCG/ACT nasal spray Place 2 sprays into the nose daily.  16 g  4  . glucose blood test strip ACCU-CHEK AVIVA PLUS-Check blood sugar q day (Dx 250.02)  50 each  12  . HYDROcodone-acetaminophen (NORCO/VICODIN) 5-325 MG per tablet Take 1 tablet by mouth every 6 (six) hours as needed for pain.      Marland Kitchen insulin glargine (LANTUS) 100 UNIT/ML injection Inject 0.06 mLs (6 Units total) into the skin daily.  10 mL  5  . levothyroxine (SYNTHROID, LEVOTHROID) 50 MCG tablet Take 1 tablet (50 mcg total) by mouth daily.   30 tablet  5  . metoprolol succinate (TOPROL-XL) 25 MG 24 hr tablet Take 1 tablet (25 mg total) by mouth daily.  30 tablet  5  . pioglitazone (ACTOS) 45 MG tablet TAKE ONE (1) TABLET EACH DAY  30 tablet  5   No current facility-administered medications on file prior to visit.    Review of Systems Patient denies any headache, lightheadedness or dizziness.  No significant sinus or allergy symptoms.  No chest pain, tightness or palpitations.  No increased shortness of breath, cough or congestion.  Breathing stable.  Using her inhalers now as needed.  No nausea or vomiting.  No abdominal pain or cramping.  No bowel change, such as diarrhea, constipation, BRBPR or melana.  No urine change.  Gait as outlined.  Foot pain.  Saw podiatry.  Swelling improved from yesterday.  Still swollen some but better.           Objective:   Physical Exam  Filed Vitals:   09/20/13 1405  BP: 110/70  Pulse: 70  Temp: 97.5 F (35.32 C)   80 year old female in no acute distress.  HEENT:  Nares clear.  Oropharynx - without lesions.  NECK:  Supple.  Nontender.  No audible bruit.  HEART:  Appears to be regular. LUNGS:  No crackles or wheezing audible.  Respirations even and unlabored.  RADIAL PULSE:  Equal bilaterally.   ABDOMEN:  Soft, nontender.  Bowel sounds present and normal.  No audible abdominal bruit.    EXTREMITIES:  Increased pedal and lower extremity swelling - left greater than right.   No increased erythema and warmth.    FEET:  Minimal pedal edema.                   Assessment & Plan:  HEALTH MAINTENANCE.  Declines mammograms.  Desires no further testing.

## 2013-09-23 ENCOUNTER — Other Ambulatory Visit: Payer: Self-pay | Admitting: Internal Medicine

## 2013-09-25 ENCOUNTER — Encounter: Payer: Self-pay | Admitting: Internal Medicine

## 2013-09-25 NOTE — Assessment & Plan Note (Signed)
Being followed by vascular surgery.  Follow.

## 2013-09-25 NOTE — Assessment & Plan Note (Signed)
Follow cbc.  

## 2013-09-25 NOTE — Assessment & Plan Note (Signed)
Follow met b and a1c.  Decrease Actos to 64m q day.  Follow.  Urinalysis 07/29/13 - no red blood cells.

## 2013-09-25 NOTE — Assessment & Plan Note (Signed)
On synthroid.  Follow tsh.   

## 2013-09-25 NOTE — Assessment & Plan Note (Signed)
Lower extremity edema.  Left greater than right.  Continues to follow up with vascular surgery.  Stable.

## 2013-09-25 NOTE — Assessment & Plan Note (Signed)
Blood pressure as outlined.  Same meds.  Follow metabolic panel.

## 2013-09-25 NOTE — Assessment & Plan Note (Signed)
Continue B12 injections.   

## 2013-09-25 NOTE — Assessment & Plan Note (Signed)
Low cholesterol diet.  Follow lipid panel.    

## 2013-09-25 NOTE — Assessment & Plan Note (Signed)
Pain s/p injury.  Saw podiatry.  Had xray.  No fracture.  Better.

## 2013-10-12 ENCOUNTER — Telehealth: Payer: Self-pay | Admitting: Internal Medicine

## 2013-10-12 ENCOUNTER — Emergency Department: Payer: Self-pay | Admitting: Emergency Medicine

## 2013-10-12 LAB — URINALYSIS, COMPLETE
Bacteria: NONE SEEN
Bilirubin,UR: NEGATIVE
Blood: NEGATIVE
Glucose,UR: NEGATIVE mg/dL (ref 0–75)
Ketone: NEGATIVE
Nitrite: NEGATIVE
Ph: 5 (ref 4.5–8.0)
Protein: 30
Specific Gravity: 1.014 (ref 1.003–1.030)
Squamous Epithelial: 1
WBC UR: 22 /HPF (ref 0–5)

## 2013-10-12 NOTE — Telephone Encounter (Signed)
With frequent urination and back pain - needs appt asap.  I am not here this pm - can someone see her?

## 2013-10-12 NOTE — Telephone Encounter (Signed)
Noted.   Please request records.  Also let her know to keep [place] posted.  Thanks.

## 2013-10-12 NOTE — Telephone Encounter (Signed)
There were no appointments with anyone today

## 2013-10-12 NOTE — Telephone Encounter (Signed)
Records will be requested tomorrow once they are ready

## 2013-10-12 NOTE — Telephone Encounter (Signed)
Please advise if you can work her in somewhere

## 2013-10-12 NOTE — Telephone Encounter (Signed)
Pt notified to let us know if her sx's do not resolve with treatment

## 2013-10-12 NOTE — Telephone Encounter (Signed)
The patient is having frequent urination and back pain . She is wanting an appointment with Dr. Nicki Reaper.

## 2013-10-12 NOTE — Telephone Encounter (Signed)
Pt was seen and treated in the ER this morning

## 2013-10-13 ENCOUNTER — Ambulatory Visit: Payer: Medicare Other | Admitting: Internal Medicine

## 2013-10-14 LAB — URINE CULTURE

## 2013-10-17 ENCOUNTER — Telehealth: Payer: Self-pay | Admitting: Internal Medicine

## 2013-10-17 NOTE — Telephone Encounter (Signed)
Please advise 

## 2013-10-17 NOTE — Telephone Encounter (Signed)
Pt.notified

## 2013-10-17 NOTE — Telephone Encounter (Signed)
The patient went to the ER on 3.4.15 for her back . They told her that she had a kidney infection. She is wanting an appointment with Dr. Nicki Reaper ,because he back continues to hurt.

## 2013-10-17 NOTE — Telephone Encounter (Signed)
Please have her come in tomorrow at 11:00 (block 30 minutes).

## 2013-10-18 ENCOUNTER — Ambulatory Visit (INDEPENDENT_AMBULATORY_CARE_PROVIDER_SITE_OTHER): Payer: Medicare Other | Admitting: Internal Medicine

## 2013-10-18 ENCOUNTER — Encounter: Payer: Self-pay | Admitting: Internal Medicine

## 2013-10-18 VITALS — BP 140/60 | HR 68 | Temp 98.8°F | Ht <= 58 in | Wt 105.8 lb

## 2013-10-18 DIAGNOSIS — E119 Type 2 diabetes mellitus without complications: Secondary | ICD-10-CM

## 2013-10-18 DIAGNOSIS — M549 Dorsalgia, unspecified: Secondary | ICD-10-CM

## 2013-10-18 DIAGNOSIS — D649 Anemia, unspecified: Secondary | ICD-10-CM

## 2013-10-18 DIAGNOSIS — I10 Essential (primary) hypertension: Secondary | ICD-10-CM

## 2013-10-18 DIAGNOSIS — M79605 Pain in left leg: Secondary | ICD-10-CM

## 2013-10-18 DIAGNOSIS — I739 Peripheral vascular disease, unspecified: Secondary | ICD-10-CM

## 2013-10-18 DIAGNOSIS — R109 Unspecified abdominal pain: Secondary | ICD-10-CM

## 2013-10-18 DIAGNOSIS — R1032 Left lower quadrant pain: Secondary | ICD-10-CM

## 2013-10-18 DIAGNOSIS — M79609 Pain in unspecified limb: Secondary | ICD-10-CM

## 2013-10-18 LAB — BASIC METABOLIC PANEL
BUN: 21 mg/dL (ref 6–23)
CO2: 29 meq/L (ref 19–32)
CREATININE: 0.9 mg/dL (ref 0.4–1.2)
Calcium: 9.6 mg/dL (ref 8.4–10.5)
Chloride: 105 mEq/L (ref 96–112)
GFR: 80.15 mL/min (ref >60.00)
Glucose, Bld: 197 mg/dL — ABNORMAL HIGH (ref 70–99)
Potassium: 4.2 mEq/L (ref 3.5–5.1)
Sodium: 141 mEq/L (ref 135–145)

## 2013-10-18 LAB — HEMOGLOBIN A1C: HEMOGLOBIN A1C: 7.4 % — AB (ref 4.6–6.5)

## 2013-10-18 LAB — HEPATIC FUNCTION PANEL
ALBUMIN: 3.6 g/dL (ref 3.5–5.2)
ALK PHOS: 38 U/L — AB (ref 39–117)
ALT: 18 U/L (ref 0–35)
AST: 23 U/L (ref 0–37)
Bilirubin, Direct: 0.1 mg/dL (ref 0.0–0.3)
TOTAL PROTEIN: 7.3 g/dL (ref 6.0–8.3)
Total Bilirubin: 0.4 mg/dL (ref 0.3–1.2)

## 2013-10-18 LAB — CBC WITH DIFFERENTIAL/PLATELET
Basophils Absolute: 0 10*3/uL (ref 0.0–0.1)
Basophils Relative: 0 % (ref 0.0–3.0)
EOS PCT: 4.2 % (ref 0.0–5.0)
Eosinophils Absolute: 0.2 10*3/uL (ref 0.0–0.7)
HEMATOCRIT: 35.4 % — AB (ref 36.0–46.0)
HEMOGLOBIN: 11.6 g/dL — AB (ref 12.0–15.0)
LYMPHS ABS: 1.7 10*3/uL (ref 0.7–4.0)
Lymphocytes Relative: 30.4 % (ref 12.0–46.0)
MCHC: 32.8 g/dL (ref 30.0–36.0)
MCV: 93.3 fl (ref 78.0–100.0)
Monocytes Absolute: 0.6 10*3/uL (ref 0.1–1.0)
Monocytes Relative: 11.3 % (ref 3.0–12.0)
NEUTROS ABS: 3 10*3/uL (ref 1.4–7.7)
Neutrophils Relative %: 54.1 % (ref 43.0–77.0)
Platelets: 195 10*3/uL (ref 150.0–400.0)
RBC: 3.79 Mil/uL — AB (ref 3.87–5.11)
RDW: 16.7 % — ABNORMAL HIGH (ref 11.5–14.6)
WBC: 5.5 10*3/uL (ref 4.5–10.5)

## 2013-10-18 LAB — FERRITIN: Ferritin: 122.1 ng/mL (ref 10.0–291.0)

## 2013-10-18 NOTE — Progress Notes (Signed)
Pre-visit discussion using our clinic review tool. No additional management support is needed unless otherwise documented below in the visit note.  

## 2013-10-19 ENCOUNTER — Encounter: Payer: Self-pay | Admitting: *Deleted

## 2013-10-19 ENCOUNTER — Emergency Department: Payer: Self-pay | Admitting: Emergency Medicine

## 2013-10-19 ENCOUNTER — Encounter: Payer: Self-pay | Admitting: Internal Medicine

## 2013-10-19 DIAGNOSIS — M549 Dorsalgia, unspecified: Secondary | ICD-10-CM | POA: Insufficient documentation

## 2013-10-19 DIAGNOSIS — R109 Unspecified abdominal pain: Secondary | ICD-10-CM | POA: Insufficient documentation

## 2013-10-19 LAB — COMPREHENSIVE METABOLIC PANEL
AST: 22 U/L (ref 15–37)
Albumin: 3.1 g/dL — ABNORMAL LOW (ref 3.4–5.0)
Alkaline Phosphatase: 40 U/L — ABNORMAL LOW
Anion Gap: 6 — ABNORMAL LOW (ref 7–16)
BILIRUBIN TOTAL: 0.3 mg/dL (ref 0.2–1.0)
BUN: 24 mg/dL — AB (ref 7–18)
CALCIUM: 9.5 mg/dL (ref 8.5–10.1)
Chloride: 107 mmol/L (ref 98–107)
Co2: 29 mmol/L (ref 21–32)
Creatinine: 0.8 mg/dL (ref 0.60–1.30)
EGFR (African American): >60
EGFR (Non-African Amer.): >60
Glucose: 120 mg/dL — ABNORMAL HIGH (ref 65–99)
Osmolality: 288 (ref 275–301)
Potassium: 3.6 mmol/L (ref 3.5–5.1)
SGPT (ALT): 21 U/L (ref 12–78)
Sodium: 142 mmol/L (ref 136–145)
Total Protein: 7.3 g/dL (ref 6.4–8.2)

## 2013-10-19 LAB — URINALYSIS, COMPLETE
BACTERIA: NONE SEEN
Bilirubin,UR: NEGATIVE
GLUCOSE, UR: NEGATIVE mg/dL (ref 0–75)
KETONE: NEGATIVE
Nitrite: NEGATIVE
Ph: 5 (ref 4.5–8.0)
Protein: 100
RBC,UR: 2 /HPF (ref 0–5)
Specific Gravity: 1.018 (ref 1.003–1.030)
Squamous Epithelial: <1

## 2013-10-19 LAB — CBC WITH DIFFERENTIAL/PLATELET
BASOS PCT: 2.2 %
Basophil #: 0.1 10*3/uL (ref 0.0–0.1)
EOS PCT: 4 %
Eosinophil #: 0.2 10*3/uL (ref 0.0–0.7)
HCT: 34.9 % — AB (ref 35.0–47.0)
HGB: 11.6 g/dL — AB (ref 12.0–16.0)
LYMPHS PCT: 42.3 %
Lymphocyte #: 1.9 10*3/uL (ref 1.0–3.6)
MCH: 31.1 pg (ref 26.0–34.0)
MCHC: 33.2 g/dL (ref 32.0–36.0)
MCV: 94 fL (ref 80–100)
MONOS PCT: 8.9 %
Monocyte #: 0.4 x10 3/mm (ref 0.2–0.9)
NEUTROS ABS: 1.9 10*3/uL (ref 1.4–6.5)
NEUTROS PCT: 42.6 %
PLATELETS: 190 10*3/uL (ref 150–440)
RBC: 3.72 10*6/uL — ABNORMAL LOW (ref 3.80–5.20)
RDW: 15.8 % — ABNORMAL HIGH (ref 11.5–14.5)
WBC: 4.5 10*3/uL (ref 3.6–11.0)

## 2013-10-19 NOTE — Assessment & Plan Note (Signed)
LLQ pain.  Increased.  Tenderness on exam.  Obtain CT abdomen/pelvis.  Further w/up pending.

## 2013-10-19 NOTE — Assessment & Plan Note (Signed)
Being followed by vascular surgery.  Follow.

## 2013-10-19 NOTE — Assessment & Plan Note (Signed)
Persistent.  Some pain radiating down left leg.  Previous xray with lumbar degenerating changes.  Given this acute increase in pain and symptoms and exam as outlined, will check MRI L-Spine.  Take tylenol scheduled.  Tylenol works for her.  Follow.

## 2013-10-19 NOTE — Assessment & Plan Note (Signed)
Blood pressure as outlined.  Same meds.  Follow metabolic panel.

## 2013-10-19 NOTE — Progress Notes (Signed)
Subjective:    Patient ID: Tara [name], female    DOB: [birth year], 80 y.o.   MRN: [id number]  Back Pain Associated symptoms include leg pain.  Leg Pain   Urinary Frequency  Associated symptoms include frequency.  80 year old female with past history of hypertension, hypercholesterolemia, diabetes mellitus and anemia. She comes in today as a work in with concerns regarding increased lower back pain.  She also reports some increased LLQ pain.  Has had some persistent back pain.  Worsened recently.  No known injury or trauma.  No nausea or vomiting.   Recently diagnosed with a pelvic fracture.  Saw ortho.  Was using a walker.  Is better now.  Seeing vascular surgery for her legs.   Pain increased.  Went to the ER 6 days ago.  Diagnosed with UTI.  Preliminary culture negative.  Was treated with cipro. Has not helped.  Increased lower back pain.  Some pain down into the left leg.  She also has the left lower quadrant pain. No bowel change.  No nausea now or vomiting.  Taking pain medication.  Limits herself.  Tylenol helps.  Takes infrequently.      Past Medical History  Diagnosis Date  . Asthma   . Diabetes mellitus   . Environmental allergies   . Hypertension     Current Outpatient Prescriptions on File Prior to Visit  Medication Sig Dispense Refill  . acetaminophen (TYLENOL) 325 MG tablet Take 650 mg by mouth every 6 (six) hours as needed.      Marland Kitchen amLODipine (NORVASC) 5 MG tablet Take 1 tablet (5 mg total) by mouth 2 (two) times daily.  60 tablet  5  . aspirin 81 MG chewable tablet Chew 81 mg by mouth daily.      . Calcium Carbonate-Vitamin D (CALTRATE 600+D PO) Take by mouth.      . Cholecalciferol (D3 SUPER STRENGTH) 2000 UNITS CAPS Take by mouth.      . fexofenadine (ALLEGRA) 180 MG tablet Take 180 mg by mouth daily.      . fluticasone (FLONASE) 50 MCG/ACT nasal spray Place 2 sprays into the nose daily.  16 g  4  . glucose blood test strip ACCU-CHEK AVIVA PLUS-Check blood sugar q day  (Dx 250.02)  50 each  12  . insulin glargine (LANTUS) 100 UNIT/ML injection Inject 0.06 mLs (6 Units total) into the skin daily.  10 mL  5  . metoprolol succinate (TOPROL-XL) 25 MG 24 hr tablet Take 1 tablet (25 mg total) by mouth daily.  30 tablet  5  . pioglitazone (ACTOS) 30 MG tablet Take 1 tablet (30 mg total) by mouth daily.  30 tablet  5  . SYNTHROID 50 MCG tablet TAKE ONE (1) TABLET EACH DAY  30 tablet  11  . HYDROcodone-acetaminophen (NORCO/VICODIN) 5-325 MG per tablet Take 1 tablet by mouth every 6 (six) hours as needed for pain.       No current facility-administered medications on file prior to visit.    Review of Systems  Genitourinary: Positive for frequency.  Musculoskeletal: Positive for back pain.  Patient denies any headache, lightheadedness or dizziness.  No significant sinus or allergy symptoms.  No chest pain, tightness or palpitations.  No increased shortness of breath, cough or congestion.  Breathing stable.  Using her inhalers now as needed.  No nausea or vomiting.  LLQ pain as outlined.  No bowel change, such as diarrhea, constipation, BRBPR or melana.  Left lower back pain  as outlined.  Pain going down the left leg.  No injury or trauma.           Objective:   Physical Exam  Filed Vitals:   10/18/13 1104  BP: 140/60  Pulse: 68  Temp: 98.8 F (65.39 C)   80 year old female in no acute distress.    NECK:  Supple.  Nontender.  No audible bruit.  HEART:  Appears to be regular. LUNGS:  No crackles or wheezing audible.  Respirations even and unlabored.  RADIAL PULSE:  Equal bilaterally.   ABDOMEN:  Soft.  Increased tenderness to palpation LLQ.  No rebound or guarding.   Bowel sounds present and normal.  No audible abdominal bruit.    EXTREMITIES:  Increased pedal and lower extremity swelling - left greater than right.   No increased erythema and warmth.    FEET:  Minimal pedal edema.   MSK:  Increased pain lower back with going from seated to standing position.   Some increased pain with walking.  No increased pain with lying.  Minimal lower back discomfort with straight leg raise.                   Assessment & Plan:  HEALTH MAINTENANCE.  Declines mammograms.  Desires no further testing.

## 2013-10-21 ENCOUNTER — Other Ambulatory Visit: Payer: Self-pay | Admitting: *Deleted

## 2013-10-21 NOTE — Telephone Encounter (Signed)
Please advise on Actos dosage

## 2013-10-21 NOTE — Telephone Encounter (Signed)
Pharmacy Note:  Pt said this medication was being changed to a lower dose

## 2013-10-22 MED ORDER — PIOGLITAZONE HCL 30 MG PO TABS: 30.0000 mg | ORAL_TABLET | Freq: Every day | ORAL | Status: DC

## 2013-10-22 NOTE — Telephone Encounter (Signed)
Refilled actos 30mg  #30 with 5 refills.  (decreased actos from 45mg  to 30mg )

## 2013-10-27 ENCOUNTER — Telehealth: Payer: Self-pay | Admitting: Internal Medicine

## 2013-10-27 NOTE — Telephone Encounter (Signed)
Per Mickel Baas @ [facility]-she spoke with the PA and she states that she is okay to proceed with MRI tomorrow.

## 2013-10-27 NOTE — Telephone Encounter (Signed)
Noted  

## 2013-10-27 NOTE — Telephone Encounter (Signed)
LMTCB on nurses line

## 2013-10-27 NOTE — Telephone Encounter (Signed)
Please call [facility] (Dr Lucky Cowboy or Dr Delana Meyer) - they have seen her and did lower extremity procedure on her artery.  I need to make sure with them that she is ok for MRI.  No contraindication.  She is scheduled tomorrow for MRI.   Thanks.  Let me know either way.

## 2013-10-28 ENCOUNTER — Ambulatory Visit: Payer: Self-pay | Admitting: Internal Medicine

## 2013-10-31 ENCOUNTER — Telehealth: Payer: Self-pay | Admitting: *Deleted

## 2013-10-31 NOTE — Telephone Encounter (Signed)
Pt is still having a lot of back pain & Tylenol is not relieving the pain. Would like to know what else she can do or can we call something in to [employer]. She also had a MRI done on Friday. I have requested the report & will place on your desk.

## 2013-11-01 ENCOUNTER — Telehealth: Payer: Self-pay | Admitting: Internal Medicine

## 2013-11-01 ENCOUNTER — Other Ambulatory Visit: Payer: Self-pay | Admitting: Internal Medicine

## 2013-11-01 DIAGNOSIS — M549 Dorsalgia, unspecified: Secondary | ICD-10-CM

## 2013-11-01 DIAGNOSIS — M79606 Pain in leg, unspecified: Secondary | ICD-10-CM

## 2013-11-01 NOTE — Telephone Encounter (Signed)
Called pt 10/31/13 (1720). Discussed her mri results.  Discussed my desire for referral to Dr Sharlet Salina for further pain control and question of need for possible injection.  She was in agreement.  Will place order for referral.  Will continue tylenol for now.

## 2013-11-01 NOTE — Telephone Encounter (Signed)
MRI revealed lumbar spondylosis and degenerative disc disease, causing moderate impingement at L3-L4 and L5-S1 and mild impingement at L1-2 and L4-5.  Marrow heterogeneity is present.  Lumbar scoliosis.  Pt notified of results.  Agreed to referral to Dr Sharlet Salina for pain control and question of need for injection.  See other phone message.

## 2013-11-04 ENCOUNTER — Other Ambulatory Visit: Payer: Self-pay | Admitting: Internal Medicine

## 2013-11-08 ENCOUNTER — Ambulatory Visit (INDEPENDENT_AMBULATORY_CARE_PROVIDER_SITE_OTHER): Payer: Medicare Other | Admitting: *Deleted

## 2013-11-08 ENCOUNTER — Telehealth: Payer: Self-pay | Admitting: Internal Medicine

## 2013-11-08 DIAGNOSIS — M25552 Pain in left hip: Secondary | ICD-10-CM

## 2013-11-08 DIAGNOSIS — E538 Deficiency of other specified B group vitamins: Secondary | ICD-10-CM

## 2013-11-08 DIAGNOSIS — M549 Dorsalgia, unspecified: Secondary | ICD-10-CM

## 2013-11-08 DIAGNOSIS — M79605 Pain in left leg: Secondary | ICD-10-CM

## 2013-11-08 MED ORDER — CYANOCOBALAMIN 1000 MCG/ML IJ SOLN
1000.0000 ug | Freq: Once | INTRAMUSCULAR | Status: AC
  Administered 2013-11-08: 1000 ug via INTRAMUSCULAR

## 2013-11-08 NOTE — Telephone Encounter (Signed)
Pt seeing ortho on Friday

## 2013-11-08 NOTE — Telephone Encounter (Signed)
Noted  

## 2013-11-08 NOTE — Telephone Encounter (Signed)
Spoke to pt at length regarding her current situation and pain.  We again discussed her MRI results.  She has an appointment with Dr Sharlet Salina on 11/28/13.  I also reviewed her recent ER records including her CT scan.  Informed pt of results - including left femoral head AVN.  Needs referral to ortho.  She agrees.  Order placed for referral.  Will need further w/up for the dilated pancreatic duct.  Need to get pain under better control first.  Eating and drinking well.  No nausea or vomiting.  Bowels better.  Discussed pain medication.  Discussed starting hydrocodone.  She feels tylenol is doing better now and wants to hold on further medication at this time.

## 2013-11-22 ENCOUNTER — Encounter: Payer: Self-pay | Admitting: Internal Medicine

## 2013-12-09 ENCOUNTER — Ambulatory Visit (INDEPENDENT_AMBULATORY_CARE_PROVIDER_SITE_OTHER): Payer: Medicare Other | Admitting: *Deleted

## 2013-12-09 DIAGNOSIS — E538 Deficiency of other specified B group vitamins: Secondary | ICD-10-CM

## 2013-12-09 MED ORDER — CYANOCOBALAMIN 1000 MCG/ML IJ SOLN
1000.0000 ug | Freq: Once | INTRAMUSCULAR | Status: AC
  Administered 2013-12-09: 1000 ug via INTRAMUSCULAR

## 2013-12-22 ENCOUNTER — Other Ambulatory Visit (INDEPENDENT_AMBULATORY_CARE_PROVIDER_SITE_OTHER): Payer: Medicare Other

## 2013-12-22 DIAGNOSIS — E7800 Pure hypercholesterolemia, unspecified: Secondary | ICD-10-CM

## 2013-12-22 LAB — LIPID PANEL
CHOLESTEROL: 239 mg/dL — AB (ref 0–200)
HDL: 99.8 mg/dL (ref >39.00)
LDL Cholesterol: 117 mg/dL — ABNORMAL HIGH (ref 0–99)
Total CHOL/HDL Ratio: 2
Triglycerides: 111 mg/dL (ref 0.0–149.0)
VLDL: 22.2 mg/dL (ref 0.0–40.0)

## 2013-12-23 ENCOUNTER — Other Ambulatory Visit: Payer: Self-pay | Admitting: Internal Medicine

## 2013-12-27 ENCOUNTER — Ambulatory Visit: Payer: Medicare Other | Admitting: Internal Medicine

## 2013-12-29 ENCOUNTER — Encounter: Payer: Self-pay | Admitting: Internal Medicine

## 2013-12-29 ENCOUNTER — Ambulatory Visit: Payer: Self-pay | Admitting: Internal Medicine

## 2013-12-29 ENCOUNTER — Ambulatory Visit (INDEPENDENT_AMBULATORY_CARE_PROVIDER_SITE_OTHER): Payer: Medicare Other | Admitting: Internal Medicine

## 2013-12-29 VITALS — BP 142/52 | HR 67 | Temp 98.8°F | Resp 16 | Ht <= 58 in | Wt 106.6 lb

## 2013-12-29 DIAGNOSIS — R609 Edema, unspecified: Secondary | ICD-10-CM

## 2013-12-29 DIAGNOSIS — E039 Hypothyroidism, unspecified: Secondary | ICD-10-CM

## 2013-12-29 DIAGNOSIS — M549 Dorsalgia, unspecified: Secondary | ICD-10-CM

## 2013-12-29 DIAGNOSIS — R600 Localized edema: Secondary | ICD-10-CM

## 2013-12-29 DIAGNOSIS — I739 Peripheral vascular disease, unspecified: Secondary | ICD-10-CM

## 2013-12-29 DIAGNOSIS — E7800 Pure hypercholesterolemia, unspecified: Secondary | ICD-10-CM

## 2013-12-29 DIAGNOSIS — E538 Deficiency of other specified B group vitamins: Secondary | ICD-10-CM

## 2013-12-29 DIAGNOSIS — D649 Anemia, unspecified: Secondary | ICD-10-CM

## 2013-12-29 DIAGNOSIS — I10 Essential (primary) hypertension: Secondary | ICD-10-CM

## 2013-12-29 DIAGNOSIS — M79605 Pain in left leg: Secondary | ICD-10-CM

## 2013-12-29 DIAGNOSIS — E119 Type 2 diabetes mellitus without complications: Secondary | ICD-10-CM

## 2013-12-29 DIAGNOSIS — M79609 Pain in unspecified limb: Secondary | ICD-10-CM

## 2013-12-29 NOTE — Progress Notes (Signed)
Pre visit review using our clinic review tool, if applicable. No additional management support is needed unless otherwise documented below in the visit note. 

## 2013-12-30 ENCOUNTER — Telehealth: Payer: Self-pay | Admitting: *Deleted

## 2013-12-30 ENCOUNTER — Other Ambulatory Visit: Payer: Self-pay | Admitting: Internal Medicine

## 2013-12-30 ENCOUNTER — Ambulatory Visit: Payer: Self-pay | Admitting: Internal Medicine

## 2013-12-30 DIAGNOSIS — M7989 Other specified soft tissue disorders: Secondary | ICD-10-CM

## 2013-12-30 NOTE — Progress Notes (Signed)
Order placed for lower extremity ultrasound.

## 2013-12-30 NOTE — Telephone Encounter (Signed)
Please notify pt that her ultrasound revealed no clot.  Does appear to have hematoma - knee (we discussed this at appt).  This will take time to reabsorb.  Let us know if any problems.

## 2013-12-30 NOTE — Telephone Encounter (Signed)
Tara [name] called with a call report: Negative for DVT-? Hematoma on knee. Pt was released

## 2013-12-30 NOTE — Telephone Encounter (Signed)
Pt.notified

## 2014-01-02 ENCOUNTER — Encounter: Payer: Self-pay | Admitting: Internal Medicine

## 2014-01-02 DIAGNOSIS — M79605 Pain in left leg: Secondary | ICD-10-CM | POA: Insufficient documentation

## 2014-01-02 NOTE — Assessment & Plan Note (Signed)
Low cholesterol diet.  Follow lipid panel.    

## 2014-01-02 NOTE — Assessment & Plan Note (Signed)
Follow met b and a1c.  Sugars as outlined.  Urinalysis 07/29/13 - no red blood cells.

## 2014-01-02 NOTE — Assessment & Plan Note (Signed)
Continue B12 injections.   

## 2014-01-02 NOTE — Assessment & Plan Note (Signed)
Follow cbc.  

## 2014-01-02 NOTE — Assessment & Plan Note (Signed)
Increased pain s/p fall.  Will check xray.  Obtain lower extremity duplex as outlined.  Have vascular surgery evaluate.

## 2014-01-02 NOTE — Assessment & Plan Note (Signed)
On synthroid.  Follow tsh.   

## 2014-01-02 NOTE — Progress Notes (Signed)
Subjective:    Patient ID: Tara [name], female    DOB: 10/06/1917, 80 y.o.   MRN: [id number]  HPI 80 year old female with past history of hypertension, hypercholesterolemia, diabetes mellitus and anemia. She comes in today for a scheduled follow up.  Recently diagnosed with a pelvic fracture.  Saw ortho.  Was using a walker.  Better.  then developed increased back, hip and leg pain.  See previous note for details.  Had MRI L-S spine and CT abdomen/pelvis.  See reports for details.  MRI revealed significant lumbar spondylosis and DDD.  Saw ortho.  Had f/u xrays hip.  No fracture or other acute abnormality noted per report.  Seeing Dr Mack Guise.  Seeing vascular surgery for her legs.  She comes in today stating that she fell several days ago.  Left leg got caught under her.  Has had increased pain and swelling in her legs - left worse - since the fall.  Increased pain with bending of the left leg.  Taking tramadol.  Helps.  Sugars have been doing well.  Brought in no recorded sugar readings.  States am sugars averaging 90-130s.   Breathing stable.  No significant allergy or sinus symptoms.  Bowels stable.       Past Medical History  Diagnosis Date  . Asthma   . Diabetes mellitus   . Environmental allergies   . Hypertension     Current Outpatient Prescriptions on File Prior to Visit  Medication Sig Dispense Refill  . acetaminophen (TYLENOL) 325 MG tablet Take 650 mg by mouth every 6 (six) hours as needed.      Marland Kitchen amLODipine (NORVASC) 5 MG tablet Take 1 tablet (5 mg total) by mouth 2 (two) times daily.  60 tablet  5  . aspirin 81 MG chewable tablet Chew 81 mg by mouth daily.      . Calcium Carbonate-Vitamin D (CALTRATE 600+D PO) Take by mouth.      . Cholecalciferol (D3 SUPER STRENGTH) 2000 UNITS CAPS Take by mouth.      . fexofenadine (ALLEGRA) 180 MG tablet Take 180 mg by mouth daily.      . fluticasone (FLONASE) 50 MCG/ACT nasal spray Place 2 sprays into both nostrils daily.  16 g  5  .  glucose blood test strip ACCU-CHEK AVIVA PLUS-Check blood sugar q day (Dx 250.02)  50 each  12  . HYDROcodone-acetaminophen (NORCO/VICODIN) 5-325 MG per tablet Take 1 tablet by mouth every 6 (six) hours as needed for pain.      Marland Kitchen LANTUS 100 UNIT/ML injection INJECT 6 UNITS INTO SKIN DAILY  10 mL  3  . metoprolol succinate (TOPROL-XL) 25 MG 24 hr tablet TAKE ONE (1) TABLET EACH DAY  30 tablet  5  . pioglitazone (ACTOS) 30 MG tablet Take 1 tablet (30 mg total) by mouth daily.  30 tablet  5  . SYNTHROID 50 MCG tablet TAKE ONE (1) TABLET EACH DAY  30 tablet  11   No current facility-administered medications on file prior to visit.    Review of Systems Patient denies any headache, lightheadedness or dizziness.  No significant sinus or allergy symptoms.  No chest pain, tightness or palpitations.  No increased shortness of breath, cough or congestion.  Breathing stable.  Using her inhalers now as needed.  No nausea or vomiting.  No abdominal pain or cramping.  No bowel change, such as diarrhea, constipation, BRBPR or melana.  No urine change.  Increased pain and swelling left leg -  s/p fall - as outlined.            Objective:   Physical Exam  Filed Vitals:   12/29/13 1036  BP: 142/52  Pulse: 67  Temp: 98.8 F (37.1 C)  Resp: 28   80 year old female in no acute distress.   NECK:  Supple.  Nontender.  No audible bruit.  HEART:  Appears to be regular. LUNGS:  No crackles or wheezing audible.  Respirations even and unlabored.  RADIAL PULSE:  Equal bilaterally.   ABDOMEN:  Soft, nontender.  Bowel sounds present and normal.  No audible abdominal bruit.    EXTREMITIES:  Increased pedal and lower extremity swelling - left greater than right.   No increased erythema and warmth.  Hematoma - posterior knee.  Increased pain and fullness with flexion of the left knee.  Increased pain with weight bearing.   FEET:  Minimal pedal edema.                   Assessment & Plan:  HEALTH MAINTENANCE.   Declines mammograms.  Desires no further testing.

## 2014-01-02 NOTE — Assessment & Plan Note (Signed)
Lower extremity edema.  Left greater than right.  Has worsened since the fall.  Has the hematoma - posterior knee.  Will check lower extremity duplex.  Also have vascular surgery evaluate.

## 2014-01-02 NOTE — Assessment & Plan Note (Signed)
Blood pressure as outlined.  Same meds.  Follow metabolic panel.

## 2014-01-02 NOTE — Assessment & Plan Note (Signed)
Being followed by vascular surgery.  Given the increased pain and swelling, will have vascular surgery evaluate.

## 2014-01-02 NOTE — Assessment & Plan Note (Signed)
Improved.  S/p injection.  Seeing Dr Sharlet Salina.  Has tramadol.

## 2014-01-27 ENCOUNTER — Encounter: Payer: Self-pay | Admitting: Internal Medicine

## 2014-02-06 ENCOUNTER — Other Ambulatory Visit: Payer: Self-pay | Admitting: Internal Medicine

## 2014-02-08 ENCOUNTER — Ambulatory Visit (INDEPENDENT_AMBULATORY_CARE_PROVIDER_SITE_OTHER): Payer: Medicare Other | Admitting: *Deleted

## 2014-02-08 DIAGNOSIS — E538 Deficiency of other specified B group vitamins: Secondary | ICD-10-CM

## 2014-02-08 MED ORDER — CYANOCOBALAMIN 1000 MCG/ML IJ SOLN
1000.0000 ug | Freq: Once | INTRAMUSCULAR | Status: AC
  Administered 2014-02-08: 1000 ug via INTRAMUSCULAR

## 2014-02-17 ENCOUNTER — Encounter: Payer: Self-pay | Admitting: Internal Medicine

## 2014-03-17 ENCOUNTER — Ambulatory Visit (INDEPENDENT_AMBULATORY_CARE_PROVIDER_SITE_OTHER): Payer: Medicare Other | Admitting: Internal Medicine

## 2014-03-17 ENCOUNTER — Encounter: Payer: Self-pay | Admitting: Internal Medicine

## 2014-03-17 VITALS — BP 138/60 | HR 59 | Temp 97.8°F | Resp 16 | Ht <= 58 in | Wt 107.8 lb

## 2014-03-17 DIAGNOSIS — E1159 Type 2 diabetes mellitus with other circulatory complications: Secondary | ICD-10-CM

## 2014-03-17 DIAGNOSIS — E7800 Pure hypercholesterolemia, unspecified: Secondary | ICD-10-CM

## 2014-03-17 DIAGNOSIS — R609 Edema, unspecified: Secondary | ICD-10-CM

## 2014-03-17 DIAGNOSIS — M545 Low back pain, unspecified: Secondary | ICD-10-CM

## 2014-03-17 DIAGNOSIS — I10 Essential (primary) hypertension: Secondary | ICD-10-CM

## 2014-03-17 DIAGNOSIS — R600 Localized edema: Secondary | ICD-10-CM

## 2014-03-17 DIAGNOSIS — I739 Peripheral vascular disease, unspecified: Secondary | ICD-10-CM

## 2014-03-17 DIAGNOSIS — E039 Hypothyroidism, unspecified: Secondary | ICD-10-CM

## 2014-03-17 DIAGNOSIS — D649 Anemia, unspecified: Secondary | ICD-10-CM

## 2014-03-17 DIAGNOSIS — E538 Deficiency of other specified B group vitamins: Secondary | ICD-10-CM

## 2014-03-17 MED ORDER — PIOGLITAZONE HCL 15 MG PO TABS: 15.0000 mg | ORAL_TABLET | Freq: Every day | ORAL | Status: DC

## 2014-03-17 MED ORDER — CYANOCOBALAMIN 1000 MCG/ML IJ SOLN
1000.0000 ug | Freq: Once | INTRAMUSCULAR | Status: AC
  Administered 2014-03-17: 1000 ug via INTRAMUSCULAR

## 2014-03-17 NOTE — Progress Notes (Signed)
Pre-visit discussion using our clinic review tool. No additional management support is needed unless otherwise documented below in the visit note.  

## 2014-03-19 ENCOUNTER — Encounter: Payer: Self-pay | Admitting: Internal Medicine

## 2014-03-19 NOTE — Assessment & Plan Note (Signed)
Blood pressure as outlined.  Same meds.  Follow metabolic panel.

## 2014-03-19 NOTE — Assessment & Plan Note (Signed)
Being followed by vascular surgery.

## 2014-03-19 NOTE — Assessment & Plan Note (Signed)
On synthroid.  Follow tsh.   

## 2014-03-19 NOTE — Assessment & Plan Note (Signed)
Low cholesterol diet.  Follow lipid panel.    

## 2014-03-19 NOTE — Assessment & Plan Note (Signed)
Follow met b and a1c.  Sugars as outlined.  Urinalysis 07/29/13 - no red blood cells.  Will decrease actos to 41m q day.  May help swelling.

## 2014-03-19 NOTE — Assessment & Plan Note (Signed)
Lower extremity edema.  Left greater than right. Has seen vascular surgery.  S/p intervention.  Discussed support hose and leg elevation.  Decrease actos.  Explained if persistent, would need referral back to vascular surgery.

## 2014-03-19 NOTE — Assessment & Plan Note (Signed)
Continue B12 injections.   

## 2014-03-19 NOTE — Assessment & Plan Note (Signed)
Follow cbc.  

## 2014-03-19 NOTE — Assessment & Plan Note (Signed)
Improved.  S/p injection.  Seeing Dr Sharlet Salina.  Has tramadol.

## 2014-03-19 NOTE — Progress Notes (Signed)
Subjective:    Patient ID: Tara [name], female    DOB: 08-13-1917, 80 y.o.   MRN: [id number]  Diabetes  80 year old female with past history of hypertension, hypercholesterolemia, diabetes mellitus and anemia. She comes in today for a scheduled follow up.  Recently diagnosed with a pelvic fracture.  Saw ortho.  Was using a walker.  Better.  Then developed increased back, hip and leg pain.  See previous note for details.  Had MRI L-S spine and CT abdomen/pelvis.  See reports for details.  MRI revealed significant lumbar spondylosis and DDD.  Saw ortho.  Had f/u xrays hip.  No fracture or other acute abnormality noted per report.  Seeing Dr Mack Guise.  Seeing vascular surgery for her legs.  Taking tramadol.  Helps.  Sugars have been doing well.  Sugar readings attached.  AM sugars averaging 80-120.    Breathing stable.  No significant allergy or sinus symptoms.  Bowels stable.  Still with some leg pain and swelling.  Overall stable.       Past Medical History  Diagnosis Date  . Asthma   . Diabetes mellitus   . Environmental allergies   . Hypertension     Current Outpatient Prescriptions on File Prior to Visit  Medication Sig Dispense Refill  . acetaminophen (TYLENOL) 325 MG tablet Take 650 mg by mouth every 6 (six) hours as needed.      Marland Kitchen amLODipine (NORVASC) 5 MG tablet Take 1 tablet (5 mg total) by mouth 2 (two) times daily.  60 tablet  5  . aspirin 81 MG chewable tablet Chew 81 mg by mouth daily.      . Calcium Carbonate-Vitamin D (CALTRATE 600+D PO) Take by mouth.      . Cholecalciferol (D3 SUPER STRENGTH) 2000 UNITS CAPS Take by mouth.      . fexofenadine (ALLEGRA) 180 MG tablet Take 180 mg by mouth daily.      . fluticasone (FLONASE) 50 MCG/ACT nasal spray Place 2 sprays into both nostrils daily.  16 g  5  . glucose blood test strip ACCU-CHEK AVIVA PLUS-Check blood sugar q day (Dx 250.02)  50 each  12  . HYDROcodone-acetaminophen (NORCO/VICODIN) 5-325 MG per tablet Take 1 tablet by  mouth every 6 (six) hours as needed for pain.      Marland Kitchen LANTUS 100 UNIT/ML injection INJECT 6 UNITS INTO THE SKIN DAILY  10 mL  3  . metoprolol succinate (TOPROL-XL) 25 MG 24 hr tablet TAKE ONE (1) TABLET EACH DAY  30 tablet  5  . SYNTHROID 50 MCG tablet TAKE ONE (1) TABLET EACH DAY  30 tablet  11   No current facility-administered medications on file prior to visit.    Review of Systems Patient denies any headache, lightheadedness or dizziness.  No significant sinus or allergy symptoms.  No chest pain, tightness or palpitations.  No increased shortness of breath, cough or congestion.  Breathing stable.  Using her inhalers now as needed.  No nausea or vomiting.  No abdominal pain or cramping.  No bowel change, such as diarrhea, constipation, BRBPR or melana.  No urine change.  Increased pain and swelling in legs.             Objective:   Physical Exam  Filed Vitals:   03/17/14 1523  BP: 138/60  Pulse: 59  Temp: 97.8 F (36.6 C)  Resp: 67   80 year old female in no acute distress.   NECK:  Supple.  Nontender.  No audible bruit.  HEART:  Appears to be regular. LUNGS:  No crackles or wheezing audible.  Respirations even and unlabored.  RADIAL PULSE:  Equal bilaterally.   ABDOMEN:  Soft, nontender.  Bowel sounds present and normal.  No audible abdominal bruit.    EXTREMITIES:  Increased pedal and lower extremity swelling.  Could not appreciate DP pulses.  No increased erythema.  FEET:  Minimal pedal edema.                   Assessment & Plan:  HEALTH MAINTENANCE.  Declines mammograms.  Desires no further testing.

## 2014-04-11 ENCOUNTER — Other Ambulatory Visit: Payer: Self-pay | Admitting: Internal Medicine

## 2014-04-11 ENCOUNTER — Telehealth: Payer: Self-pay | Admitting: *Deleted

## 2014-04-11 MED ORDER — GLUCOSE BLOOD VI STRP: ORAL_STRIP | Status: DC

## 2014-04-11 NOTE — Telephone Encounter (Signed)
Fax received from pharmacy, needing diagnosis code and testing frequency with Rx. Rx sent to pharmacy by escript

## 2014-04-26 ENCOUNTER — Ambulatory Visit (INDEPENDENT_AMBULATORY_CARE_PROVIDER_SITE_OTHER): Payer: Medicare Other | Admitting: *Deleted

## 2014-04-26 DIAGNOSIS — E538 Deficiency of other specified B group vitamins: Secondary | ICD-10-CM

## 2014-04-26 DIAGNOSIS — Z23 Encounter for immunization: Secondary | ICD-10-CM

## 2014-04-26 MED ORDER — CYANOCOBALAMIN 1000 MCG/ML IJ SOLN
1000.0000 ug | Freq: Once | INTRAMUSCULAR | Status: AC
  Administered 2014-04-26: 1000 ug via INTRAMUSCULAR

## 2014-05-12 ENCOUNTER — Other Ambulatory Visit: Payer: Self-pay | Admitting: Internal Medicine

## 2014-05-30 ENCOUNTER — Ambulatory Visit (INDEPENDENT_AMBULATORY_CARE_PROVIDER_SITE_OTHER): Payer: Medicare Other | Admitting: *Deleted

## 2014-05-30 DIAGNOSIS — E538 Deficiency of other specified B group vitamins: Secondary | ICD-10-CM

## 2014-05-30 MED ORDER — CYANOCOBALAMIN 1000 MCG/ML IJ SOLN
1000.0000 ug | Freq: Once | INTRAMUSCULAR | Status: AC
  Administered 2014-05-30: 1000 ug via INTRAMUSCULAR

## 2014-06-13 ENCOUNTER — Other Ambulatory Visit: Payer: Self-pay | Admitting: Internal Medicine

## 2014-06-21 ENCOUNTER — Other Ambulatory Visit (INDEPENDENT_AMBULATORY_CARE_PROVIDER_SITE_OTHER): Payer: Medicare Other

## 2014-06-21 DIAGNOSIS — D649 Anemia, unspecified: Secondary | ICD-10-CM

## 2014-06-21 DIAGNOSIS — E1159 Type 2 diabetes mellitus with other circulatory complications: Secondary | ICD-10-CM

## 2014-06-21 LAB — CBC WITH DIFFERENTIAL/PLATELET
BASOS ABS: 0 10*3/uL (ref 0.0–0.1)
Basophils Relative: 0.4 % (ref 0.0–3.0)
EOS ABS: 0.2 10*3/uL (ref 0.0–0.7)
Eosinophils Relative: 3.1 % (ref 0.0–5.0)
HCT: 33.5 % — ABNORMAL LOW (ref 36.0–46.0)
HEMOGLOBIN: 11.1 g/dL — AB (ref 12.0–15.0)
LYMPHS PCT: 43.8 % (ref 12.0–46.0)
Lymphs Abs: 2.3 10*3/uL (ref 0.7–4.0)
MCHC: 33.1 g/dL (ref 30.0–36.0)
MCV: 92.8 fl (ref 78.0–100.0)
Monocytes Absolute: 0.3 10*3/uL (ref 0.1–1.0)
Monocytes Relative: 6.3 % (ref 3.0–12.0)
NEUTROS ABS: 2.4 10*3/uL (ref 1.4–7.7)
NEUTROS PCT: 46.4 % (ref 43.0–77.0)
Platelets: 202 10*3/uL (ref 150.0–400.0)
RBC: 3.61 Mil/uL — AB (ref 3.87–5.11)
RDW: 16.1 % — AB (ref 11.5–15.5)
WBC: 5.2 10*3/uL (ref 4.0–10.5)

## 2014-06-21 LAB — LIPID PANEL
CHOLESTEROL: 209 mg/dL — AB (ref 0–200)
HDL: 77 mg/dL (ref >39.00)
LDL Cholesterol: 111 mg/dL — ABNORMAL HIGH (ref 0–99)
NonHDL: 132
TRIGLYCERIDES: 105 mg/dL (ref 0.0–149.0)
Total CHOL/HDL Ratio: 3
VLDL: 21 mg/dL (ref 0.0–40.0)

## 2014-06-21 LAB — HEMOGLOBIN A1C: Hgb A1c MFr Bld: 7.2 % — ABNORMAL HIGH (ref 4.6–6.5)

## 2014-06-21 LAB — BASIC METABOLIC PANEL
BUN: 27 mg/dL — AB (ref 6–23)
CO2: 33 mEq/L — ABNORMAL HIGH (ref 19–32)
Calcium: 9.1 mg/dL (ref 8.4–10.5)
Chloride: 108 mEq/L (ref 96–112)
Creatinine, Ser: 0.8 mg/dL (ref 0.4–1.2)
GFR: 87.09 mL/min (ref >60.00)
GLUCOSE: 107 mg/dL — AB (ref 70–99)
Potassium: 3.7 mEq/L (ref 3.5–5.1)
Sodium: 141 mEq/L (ref 135–145)

## 2014-06-21 LAB — HEPATIC FUNCTION PANEL
ALT: 16 U/L (ref 0–35)
AST: 25 U/L (ref 0–37)
Albumin: 2.8 g/dL — ABNORMAL LOW (ref 3.5–5.2)
Alkaline Phosphatase: 32 U/L — ABNORMAL LOW (ref 39–117)
BILIRUBIN TOTAL: 0.4 mg/dL (ref 0.2–1.2)
Bilirubin, Direct: 0 mg/dL (ref 0.0–0.3)
TOTAL PROTEIN: 6.6 g/dL (ref 6.0–8.3)

## 2014-06-21 LAB — FERRITIN: Ferritin: 116.9 ng/mL (ref 10.0–291.0)

## 2014-06-23 ENCOUNTER — Ambulatory Visit (INDEPENDENT_AMBULATORY_CARE_PROVIDER_SITE_OTHER): Payer: Medicare Other | Admitting: Internal Medicine

## 2014-06-23 ENCOUNTER — Encounter: Payer: Self-pay | Admitting: Internal Medicine

## 2014-06-23 VITALS — BP 158/68 | HR 63 | Temp 97.8°F | Ht <= 58 in | Wt 105.8 lb

## 2014-06-23 DIAGNOSIS — M79605 Pain in left leg: Secondary | ICD-10-CM

## 2014-06-23 DIAGNOSIS — I10 Essential (primary) hypertension: Secondary | ICD-10-CM

## 2014-06-23 DIAGNOSIS — E039 Hypothyroidism, unspecified: Secondary | ICD-10-CM

## 2014-06-23 DIAGNOSIS — M545 Low back pain: Secondary | ICD-10-CM

## 2014-06-23 DIAGNOSIS — I739 Peripheral vascular disease, unspecified: Secondary | ICD-10-CM

## 2014-06-23 DIAGNOSIS — E538 Deficiency of other specified B group vitamins: Secondary | ICD-10-CM

## 2014-06-23 DIAGNOSIS — E7800 Pure hypercholesterolemia, unspecified: Secondary | ICD-10-CM

## 2014-06-23 DIAGNOSIS — R29898 Other symptoms and signs involving the musculoskeletal system: Secondary | ICD-10-CM

## 2014-06-23 DIAGNOSIS — D649 Anemia, unspecified: Secondary | ICD-10-CM

## 2014-06-23 DIAGNOSIS — E1159 Type 2 diabetes mellitus with other circulatory complications: Secondary | ICD-10-CM

## 2014-06-23 NOTE — Progress Notes (Signed)
Pre visit review using our clinic review tool, if applicable. No additional management support is needed unless otherwise documented below in the visit note. 

## 2014-06-25 ENCOUNTER — Encounter: Payer: Self-pay | Admitting: Internal Medicine

## 2014-06-25 DIAGNOSIS — R29898 Other symptoms and signs involving the musculoskeletal system: Secondary | ICD-10-CM | POA: Insufficient documentation

## 2014-06-25 NOTE — Progress Notes (Signed)
Subjective:    Patient ID: Tara [name], female    DOB: 1917/10/29, 80 y.o.   MRN: [id number]  HPI 80 year old female with past history of hypertension, hypercholesterolemia, diabetes mellitus and anemia. She comes in today for a scheduled follow up.  She is accompanied by her daughter.  History obtained from both of them.  Has been having increased back, hip and leg pain.  See previous note for details.  Had MRI L-S spine and CT abdomen/pelvis.  See reports for details.  MRI revealed significant lumbar spondylosis and DDD.  Saw ortho.  Had f/u xrays hip.  No fracture or other acute abnormality noted per report.  Saw Dr Mack Guise.  Seeing vascular surgery for her legs.  Most recently has been seeing Dr Sharlet Salina.  Had a previous injection.  Helped some.  Now with increased pain and difficulty getting up and ambulating.  Weakness in her legs.  We discussed therapy (home physical therapy).   Sugars have been doing well.  Brought in no recorded sugar readings.  Breathing stable.  No significant allergy or sinus symptoms.  Bowels stable.       Past Medical History  Diagnosis Date  . Asthma   . Diabetes mellitus   . Environmental allergies   . Hypertension     Current Outpatient Prescriptions on File Prior to Visit  Medication Sig Dispense Refill  . acetaminophen (TYLENOL) 325 MG tablet Take 650 mg by mouth every 6 (six) hours as needed.    Marland Kitchen amLODipine (NORVASC) 5 MG tablet TAKE ONE TABLET TWICE DAILY 60 tablet 5  . aspirin 81 MG chewable tablet Chew 81 mg by mouth daily.    . Calcium Carbonate-Vitamin D (CALTRATE 600+D PO) Take by mouth.    . Cholecalciferol (D3 SUPER STRENGTH) 2000 UNITS CAPS Take by mouth.    . fexofenadine (ALLEGRA) 180 MG tablet Take 180 mg by mouth daily.    . fluticasone (FLONASE) 50 MCG/ACT nasal spray Place 2 sprays into both nostrils daily. 16 g 5  . glucose blood (ACCU-CHEK AVIVA PLUS) test strip Check sugar daily Dx. 250.00 100 each 2  . HYDROcodone-acetaminophen  (NORCO/VICODIN) 5-325 MG per tablet Take 1 tablet by mouth every 6 (six) hours as needed for pain.    Marland Kitchen LANTUS 100 UNIT/ML injection INJECT 6 UNITS INTO THE SKIN DAILY 10 mL 3  . metoprolol succinate (TOPROL-XL) 25 MG 24 hr tablet TAKE ONE (1) TABLET EACH DAY 30 tablet 5  . pioglitazone (ACTOS) 15 MG tablet Take 1 tablet (15 mg total) by mouth daily. 30 tablet 3  . SYNTHROID 50 MCG tablet TAKE ONE (1) TABLET EACH DAY 30 tablet 11   No current facility-administered medications on file prior to visit.    Review of Systems Patient denies any headache, lightheadedness or dizziness.  No significant sinus or allergy symptoms.  No chest pain, tightness or palpitations.  No increased shortness of breath, cough or congestion.  Breathing stable.  Using her inhalers now as needed.  No nausea or vomiting.  No abdominal pain or cramping.  No bowel change, such as diarrhea, constipation, BRBPR or melana.  No urine change.  Increased pain in her back and legs.  See above.  Weakness.              Objective:   Physical Exam  Filed Vitals:   06/23/14 1400  BP: 158/68  Pulse: 63  Temp: 97.8 F (36.6 C)   Blood pressure recheck:  158/62  80  year old female in no acute distress.  HEENT:  Nares clear.  Oropharynx without lesions.    NECK:  Supple.  Nontender.  No audible bruit.  HEART:  Appears to be regular. LUNGS:  No crackles or wheezing audible.  Respirations even and unlabored.  RADIAL PULSE:  Equal bilaterally.   ABDOMEN:  Soft, nontender.  Bowel sounds present and normal.  No audible abdominal bruit.    EXTREMITIES:  No increased edema present.     FEET:  No lesions.                    Assessment & Plan:  1. Essential hypertension Blood pressure as outlined.    2. Peripheral vascular disease Seeing vascular surgery.  Stable.    3. B12 deficiency Continue B12 injections.    4. Type 2 diabetes mellitus with other circulatory complications Sugars doing well.  Low carb diet.  Follow.   Lab Results  Component Value Date   HGBA1C 7.2* 06/21/2014   5. Hypothyroidism, unspecified hypothyroidism type On synthroid.  Follow tsh.   6. Hypercholesteremia Low cholesterol diet and exercise.  Follow.  Lab Results  Component Value Date   CHOL 209* 06/21/2014   HDL 77.00 06/21/2014   LDLCALC 111* 06/21/2014   LDLDIRECT 107.1 07/29/2013   TRIG 105.0 06/21/2014   CHOLHDL 3 06/21/2014   7. Anemia, unspecified anemia type Follow cbc.  Continue B12 injections.    8. Low back pain without sciatica, unspecified back pain laterality Increased pain recently.  Work up as outlined above.  MRI as outlined.  Refer back to Dr Sharlet Salina for evaluation and possible repeat injection.    9. Leg pain, left See above.    10. Weakness of both lower extremities Have home health physical therapy evaluate and treat, given more difficulty walking and getting up out of a chair, etc.    HEALTH MAINTENANCE.  Declines mammograms.  Desires no further testing.

## 2014-07-06 ENCOUNTER — Emergency Department: Payer: Self-pay | Admitting: Emergency Medicine

## 2014-07-06 LAB — COMPREHENSIVE METABOLIC PANEL
ALT: 23 U/L
Albumin: 3.2 g/dL — ABNORMAL LOW (ref 3.4–5.0)
Alkaline Phosphatase: 39 U/L — ABNORMAL LOW
Anion Gap: 5 — ABNORMAL LOW (ref 7–16)
BUN: 27 mg/dL — AB (ref 7–18)
Bilirubin,Total: 0.3 mg/dL (ref 0.2–1.0)
Calcium, Total: 9.1 mg/dL (ref 8.5–10.1)
Chloride: 107 mmol/L (ref 98–107)
Co2: 29 mmol/L (ref 21–32)
Creatinine: 0.79 mg/dL (ref 0.60–1.30)
EGFR (Non-African Amer.): >60
Glucose: 126 mg/dL — ABNORMAL HIGH (ref 65–99)
OSMOLALITY: 288 (ref 275–301)
POTASSIUM: 3.7 mmol/L (ref 3.5–5.1)
SGOT(AST): 24 U/L (ref 15–37)
Sodium: 141 mmol/L (ref 136–145)
Total Protein: 6.9 g/dL (ref 6.4–8.2)

## 2014-07-06 LAB — CBC WITH DIFFERENTIAL/PLATELET
Basophil #: 0.1 10*3/uL (ref 0.0–0.1)
Basophil %: 2 %
Eosinophil #: 0.3 10*3/uL (ref 0.0–0.7)
Eosinophil %: 5.2 %
HCT: 35.7 % (ref 35.0–47.0)
HGB: 11.6 g/dL — ABNORMAL LOW (ref 12.0–16.0)
Lymphocyte #: 1.4 10*3/uL (ref 1.0–3.6)
Lymphocyte %: 27 %
MCH: 31 pg (ref 26.0–34.0)
MCHC: 32.6 g/dL (ref 32.0–36.0)
MCV: 95 fL (ref 80–100)
Monocyte #: 0.4 x10 3/mm (ref 0.2–0.9)
Monocyte %: 8.1 %
Neutrophil #: 3.1 10*3/uL (ref 1.4–6.5)
Neutrophil %: 57.7 %
PLATELETS: 201 10*3/uL (ref 150–440)
RBC: 3.75 10*6/uL — ABNORMAL LOW (ref 3.80–5.20)
RDW: 15.4 % — ABNORMAL HIGH (ref 11.5–14.5)
WBC: 5.3 10*3/uL (ref 3.6–11.0)

## 2014-07-06 LAB — URINALYSIS, COMPLETE
BLOOD: NEGATIVE
Bacteria: NONE SEEN
Bilirubin,UR: NEGATIVE
GLUCOSE, UR: NEGATIVE mg/dL (ref 0–75)
KETONE: NEGATIVE
Leukocyte Esterase: NEGATIVE
NITRITE: NEGATIVE
Ph: 6 (ref 4.5–8.0)
Protein: 30
RBC,UR: 1 /HPF (ref 0–5)
SPECIFIC GRAVITY: 1.009 (ref 1.003–1.030)
SQUAMOUS EPITHELIAL: NONE SEEN

## 2014-07-06 LAB — TROPONIN I
Troponin-I: 0.05 ng/mL
Troponin-I: 0.06 ng/mL — ABNORMAL HIGH

## 2014-07-28 DIAGNOSIS — J449 Chronic obstructive pulmonary disease, unspecified: Secondary | ICD-10-CM

## 2014-07-28 DIAGNOSIS — M4306 Spondylolysis, lumbar region: Secondary | ICD-10-CM

## 2014-07-28 DIAGNOSIS — M545 Low back pain: Secondary | ICD-10-CM

## 2014-07-28 DIAGNOSIS — M79605 Pain in left leg: Secondary | ICD-10-CM

## 2014-08-01 ENCOUNTER — Ambulatory Visit (INDEPENDENT_AMBULATORY_CARE_PROVIDER_SITE_OTHER): Payer: Medicare Other | Admitting: Internal Medicine

## 2014-08-01 ENCOUNTER — Encounter: Payer: Self-pay | Admitting: Internal Medicine

## 2014-08-01 VITALS — BP 128/60 | HR 62 | Temp 98.5°F | Wt 104.5 lb

## 2014-08-01 DIAGNOSIS — R42 Dizziness and giddiness: Secondary | ICD-10-CM

## 2014-08-01 DIAGNOSIS — I739 Peripheral vascular disease, unspecified: Secondary | ICD-10-CM

## 2014-08-01 DIAGNOSIS — Z0000 Encounter for general adult medical examination without abnormal findings: Secondary | ICD-10-CM

## 2014-08-01 DIAGNOSIS — I10 Essential (primary) hypertension: Secondary | ICD-10-CM

## 2014-08-01 DIAGNOSIS — D649 Anemia, unspecified: Secondary | ICD-10-CM

## 2014-08-01 DIAGNOSIS — E538 Deficiency of other specified B group vitamins: Secondary | ICD-10-CM

## 2014-08-01 DIAGNOSIS — E1159 Type 2 diabetes mellitus with other circulatory complications: Secondary | ICD-10-CM

## 2014-08-01 DIAGNOSIS — E7800 Pure hypercholesterolemia, unspecified: Secondary | ICD-10-CM

## 2014-08-01 MED ORDER — CYANOCOBALAMIN 1000 MCG/ML IJ SOLN
1000.0000 ug | Freq: Once | INTRAMUSCULAR | Status: AC
  Administered 2014-08-01: 1000 ug via INTRAMUSCULAR

## 2014-08-01 NOTE — Progress Notes (Signed)
Pre visit review using our clinic review tool, if applicable. No additional management support is needed unless otherwise documented below in the visit note. 

## 2014-08-07 ENCOUNTER — Encounter: Payer: Self-pay | Admitting: Internal Medicine

## 2014-08-07 DIAGNOSIS — R42 Dizziness and giddiness: Secondary | ICD-10-CM | POA: Insufficient documentation

## 2014-08-07 NOTE — Progress Notes (Signed)
Subjective:    Patient ID: Tara [name], female    DOB: 11-23-1917, 80 y.o.   MRN: [id number]  HPI The patient is here for annual Medicare wellness examination and management of other chronic and acute problems.   The risk factors are reflected in the social history.  She denies any tobacco use.  No alcohol or illicit drug use.    The roster of [name] physicians providing medical care to patient -  Dr Sharlet Salina Dr [facility]  Activities of daily living:  The patient is 100% independent in [name] ADLs: dressing, toileting, feeding as well as independent mobility.  Uses a shower chair to shower.  Does report some problems climbing a flight of stairs.    Home safety : The patient has smoke detectors in the home. She wears seatbelt.  There are no firearms at home. There is no violence in the home.   There is no risks for hepatitis, STDs or HIV. There is no   history of blood transfusion. They have no travel history to infectious disease endemic areas of the world.  She sees an eye doctor.  She admits to slight hearing difficulty with regard to whispered voices.  Has to ask people to speak up or repeat themselves.   She does not  have excessive sun exposure. Discussed the need for sun protection: hats, long sleeves and use of sunscreen if there is significant sun exposure.    Diet: the importance of a healthy diet is discussed. She tries to watch what she eats.    The benefits of regular aerobic exercise were discussed. She tries to stay active.    Depression screen: there are no signs or vegative symptoms of depression- irritability, change in appetite, anhedonia, sadness/tearfullness.  States she gets frustrated when she is not able to do things that she used to be able to do.    Cognitive assessment: the patient manages [name] her financial and personal affairs and is actively engaged. She could relate day,date,year and events; recalled 2/3 objects at 3 minutes; performed clock-face test  normally.  Unable to calculate backwards.   The following portions of the patient's history were reviewed and updated as appropriate: allergies, current medications, past family history, past medical history,  past surgical history, past social history  and problem list.  Visual acuity was not assessed per patient preference since she has regular follow up with her ophthalmologist. Hearing and body mass index were reviewed.   During the course of the visit the patient was educated and counseled about appropriate screening and preventive services including : fall prevention , diabetes screening, nutrition counseling, colorectal cancer screening, and recommended immunizations.     Review of Systems Patient reports that prior to Thanksgiving, she had problems with dizziness.  On Thanksgiving day, she was in the ER with dizziness and elevated blood pressure.  In ER, diagnosed with inner ear (?)/vertigo.  Was given (what sounds like) meclizine.  Was referred to Dr Chancy Milroy.  Had stress test that was ok.  Amlodipine was stopped and she was placed on enalapril.  Daughter reports blood pressure has been better.  States dizziness has resolved.  Feels better.  Prior to this episode, she had seen ENT and found to have fluid in left ear.  Had tube placed.   No chest pain, tightness or palpatations. No increased shortness of breath, cough or congestion.  No nausea or vomiting.  No abdominal pain or cramping.  No bowel change, such as diarrhea, constipation,  BRBPR or melana.  No urine change.   States sugars doing ok.       Objective:   Physical Exam Filed Vitals:   08/01/14 1222  BP: 128/60  Pulse: 62  Temp: 98.5 F (36.9 C)   Blood pressure recheck:  40-95/1  80 year old female in no acute distress.   HEENT:  Nares- clear.  Oropharynx - without lesions.  TMs without erythema.   NECK:  Supple.  Nontender.  No audible bruit.  HEART:  Appears to be regular. LUNGS:  No crackles or wheezing audible.   Respirations even and unlabored.  RADIAL PULSE:  Equal bilaterally. ABDOMEN:  Soft, nontender.  Bowel sounds present and normal.  No audible abdominal bruit.  EXTREMITIES:  No increased edema present.           Assessment & Plan:  This is a routine wellness examination for this patient.  I reviewed [name] health maintenance protocols including mammography, colonoscopy, bone density.  Needed referrals placed. Age and diagnosis appropriate screening labs were ordered. She declines mammogram.   Her immunization history was reviewed.  Her current medications and allergies were reviewed and refills of her chronic medications were ordered if needed.  The plan for yearly health maintenance was discussed.     MEDICARE ATTESTATION I have personally reviewed:  The patient's medical and social history. The use of alcohol, tobacco and illicit drugs. The current medications and supplements. The patient's function ability including ADLs, fall risks, home safety risks, cognitive, and hearing and visual impairment.   Diet and physical activities. Evaluation for depression and mood disorders.    The patient's weight, height, BMI have been recorded in the chart.  I have made referrals, counseled and provided education to the patient based on review of the above.   Other medical problems addressed:   1. B12 deficiency - cyanocobalamin ((VITAMIN B-12)) injection 1,000 mcg; Inject 1 mL (1,000 mcg total) into the muscle once.  2. Essential hypertension Blood pressure elevated recently and on my check today.  Amlodipine was stopped.  She is now on enalapril.  Did not have reaction when started on enalapril recently.  We had listed as a possible reaction.  (was taken along with amoxicillin).  Continue same medication regimen.  Follow pressures.  Follow met b.  Has f/u with Dr Chancy Milroy.    3. Peripheral vascular disease Followed by vascular surgery.   4. Type 2 diabetes mellitus with other circulatory  complications States sugars doing relatively well.  Follow.  Follow met b and a1c.  Lab Results  Component Value Date   HGBA1C 7.2* 06/21/2014   5. Hypercholesteremia Low cholesterol diet.  Follow lipid panel.   Lab Results  Component Value Date   CHOL 209* 06/21/2014   HDL 77.00 06/21/2014   LDLCALC 111* 06/21/2014   LDLDIRECT 107.1 07/29/2013   TRIG 105.0 06/21/2014   CHOLHDL 3 06/21/2014   6. Anemia, unspecified anemia type Hgb just checked 06/21/14 - 11.1.  Follow.  Stable.    7. Dizziness Acute episode as outlined.  Unclear etiology.  Stress test negative.  On meclizine.  Still taking twice a day.  Better.  Will decrease meclizine.  Had the previous problems with fluid - ear - requiring tube.  Better now.  Discussed further w/up.  Will follow.  Will notify me or be reevaluated if symptoms persist.

## 2014-08-16 ENCOUNTER — Telehealth: Payer: Self-pay | Admitting: Internal Medicine

## 2014-08-16 DIAGNOSIS — R42 Dizziness and giddiness: Secondary | ICD-10-CM

## 2014-08-16 NOTE — Telephone Encounter (Signed)
[name] notified and  verbalized understanding. Advised [facility] would call with appointment. States pt has seen Dr. Richardson Landry before, added to referral note

## 2014-08-16 NOTE — Telephone Encounter (Signed)
Order placed for ENT referral.   Also, yes I would like for her to continue her enalapril.  (she had the dizziness episode last time before starting enalapril).   I do not think the blood pressure medication is causing the dizziness.

## 2014-08-16 NOTE — Telephone Encounter (Signed)
Tara [name] stopped by to inform Dr. Nicki Reaper that Tara [name] is having dizzy episodes again. Please advise/msn

## 2014-08-16 NOTE — Telephone Encounter (Signed)
If these episodes are the same as she had previously, then I would recommend referral to ENT for evaluation.  (was recently seen in ER for dizziness an dx with vertigo.  Had negative cardiac w/up including stress test.  By the time I saw her dizziness had resolve and she desired no further w/up).  So, if this is similar and movement worsens, etc, I recommend ENT evaluation.  Also confirm blood pressure ok.  Let me know and I will place order for urgent referral (if agreeable).

## 2014-08-16 NOTE — Telephone Encounter (Signed)
See note

## 2014-08-16 NOTE — Telephone Encounter (Signed)
Spoke to Tara [name],states dizziness is the same as previous, states worse with movement.  Pt was afraid to take her enalapril last night because she was told it may cause dizziness. BP this morning was 136/70. BP now is 174/84 HR 67. Notified on ENT referral if still advised. Pt wants to know if it os ok for her to take her BP medication today.

## 2014-08-21 DIAGNOSIS — R42 Dizziness and giddiness: Secondary | ICD-10-CM | POA: Diagnosis not present

## 2014-08-21 DIAGNOSIS — H6982 Other specified disorders of Eustachian tube, left ear: Secondary | ICD-10-CM | POA: Diagnosis not present

## 2014-08-24 DIAGNOSIS — I340 Nonrheumatic mitral (valve) insufficiency: Secondary | ICD-10-CM | POA: Diagnosis not present

## 2014-08-24 DIAGNOSIS — E785 Hyperlipidemia, unspecified: Secondary | ICD-10-CM | POA: Diagnosis not present

## 2014-08-24 DIAGNOSIS — I10 Essential (primary) hypertension: Secondary | ICD-10-CM | POA: Diagnosis not present

## 2014-09-12 ENCOUNTER — Encounter: Payer: Self-pay | Admitting: Internal Medicine

## 2014-09-12 ENCOUNTER — Ambulatory Visit (INDEPENDENT_AMBULATORY_CARE_PROVIDER_SITE_OTHER): Payer: Medicare Other | Admitting: Internal Medicine

## 2014-09-12 VITALS — BP 140/60 | HR 78 | Temp 98.3°F | Ht <= 58 in | Wt 101.8 lb

## 2014-09-12 DIAGNOSIS — D649 Anemia, unspecified: Secondary | ICD-10-CM | POA: Diagnosis not present

## 2014-09-12 DIAGNOSIS — E1159 Type 2 diabetes mellitus with other circulatory complications: Secondary | ICD-10-CM

## 2014-09-12 DIAGNOSIS — I10 Essential (primary) hypertension: Secondary | ICD-10-CM

## 2014-09-12 DIAGNOSIS — E7800 Pure hypercholesterolemia, unspecified: Secondary | ICD-10-CM

## 2014-09-12 DIAGNOSIS — R42 Dizziness and giddiness: Secondary | ICD-10-CM | POA: Diagnosis not present

## 2014-09-12 DIAGNOSIS — I739 Peripheral vascular disease, unspecified: Secondary | ICD-10-CM | POA: Diagnosis not present

## 2014-09-12 DIAGNOSIS — Z23 Encounter for immunization: Secondary | ICD-10-CM

## 2014-09-12 DIAGNOSIS — E039 Hypothyroidism, unspecified: Secondary | ICD-10-CM

## 2014-09-12 DIAGNOSIS — R2681 Unsteadiness on feet: Secondary | ICD-10-CM

## 2014-09-12 LAB — BASIC METABOLIC PANEL
BUN: 29 mg/dL — AB (ref 6–23)
CHLORIDE: 107 meq/L (ref 96–112)
CO2: 31 mEq/L (ref 19–32)
CREATININE: 0.91 mg/dL (ref 0.40–1.20)
Calcium: 9.5 mg/dL (ref 8.4–10.5)
GFR: 73.94 mL/min (ref >60.00)
Glucose, Bld: 177 mg/dL — ABNORMAL HIGH (ref 70–99)
Potassium: 4.6 mEq/L (ref 3.5–5.1)
SODIUM: 141 meq/L (ref 135–145)

## 2014-09-12 LAB — CBC WITH DIFFERENTIAL/PLATELET
BASOS PCT: 0.8 % (ref 0.0–3.0)
Basophils Absolute: 0 10*3/uL (ref 0.0–0.1)
EOS ABS: 0.1 10*3/uL (ref 0.0–0.7)
EOS PCT: 2.9 % (ref 0.0–5.0)
HCT: 35 % — ABNORMAL LOW (ref 36.0–46.0)
Hemoglobin: 11.8 g/dL — ABNORMAL LOW (ref 12.0–15.0)
LYMPHS PCT: 49.9 % — AB (ref 12.0–46.0)
Lymphs Abs: 2.5 10*3/uL (ref 0.7–4.0)
MCHC: 33.6 g/dL (ref 30.0–36.0)
MCV: 91.7 fl (ref 78.0–100.0)
MONOS PCT: 8.4 % (ref 3.0–12.0)
Monocytes Absolute: 0.4 10*3/uL (ref 0.1–1.0)
Neutro Abs: 1.9 10*3/uL (ref 1.4–7.7)
Neutrophils Relative %: 38 % — ABNORMAL LOW (ref 43.0–77.0)
Platelets: 187 10*3/uL (ref 150.0–400.0)
RBC: 3.81 Mil/uL — AB (ref 3.87–5.11)
RDW: 15.5 % (ref 11.5–15.5)
WBC: 5 10*3/uL (ref 4.0–10.5)

## 2014-09-12 LAB — FERRITIN: Ferritin: 119 ng/mL (ref 10.0–291.0)

## 2014-09-12 NOTE — Progress Notes (Signed)
Pre visit review using our clinic review tool, if applicable. No additional management support is needed unless otherwise documented below in the visit note. 

## 2014-09-12 NOTE — Progress Notes (Signed)
Patient ID: Tara [name], female   DOB: 1918/01/09, 81 y.o.   MRN: [id number]   Subjective:    Patient ID: Tara [name], female    DOB: Dec 26, 1917, 81 y.o.   MRN: [id number]  HPI  Patient here for a scheduled follow up.  Has a history of hypertension and diabetes.  Has been having issues recently with dizziness.  She notices this in the morning.  Occurs when she has been lying down and then sits up.  She feels she is going to go "over".  Has to sit for a period and then symptoms improve.  Once she is moving around, appears to be better.  Still has a funny sensation in her head.  No increased sinus congestion.  No vision change.  No chest pain or tightness.  Just saw cardiology.  He changed her bp medication.  On hydralazine now.  States blood pressure has been averaging no more than [id number] systolic readings.   Saw ENT  Tube doing well.  Did not feel dizziness of ENT origin.     Past Medical History  Diagnosis Date  . Asthma   . Diabetes mellitus   . Environmental allergies   . Hypertension     Current Outpatient Prescriptions on File Prior to Visit  Medication Sig Dispense Refill  . acetaminophen (TYLENOL) 325 MG tablet Take 650 mg by mouth every 6 (six) hours as needed.    Marland Kitchen aspirin 81 MG chewable tablet Chew 81 mg by mouth daily.    . Calcium Carbonate-Vitamin D (CALTRATE 600+D PO) Take by mouth.    . Cholecalciferol (D3 SUPER STRENGTH) 2000 UNITS CAPS Take by mouth.    . fexofenadine (ALLEGRA) 180 MG tablet Take 180 mg by mouth daily.    . fluticasone (FLONASE) 50 MCG/ACT nasal spray Place 2 sprays into both nostrils daily. 16 g 5  . glucose blood (ACCU-CHEK AVIVA PLUS) test strip Check sugar daily Dx. 250.00 100 each 2  . LANTUS 100 UNIT/ML injection INJECT 6 UNITS INTO THE SKIN DAILY 10 mL 3  . meclizine (ANTIVERT) 12.5 MG tablet Take 12.5 mg by mouth 2 (two) times daily as needed for dizziness.    . metoprolol succinate (TOPROL-XL) 25 MG 24 hr tablet TAKE ONE (1) TABLET EACH DAY 30  tablet 5  . pioglitazone (ACTOS) 15 MG tablet Take 1 tablet (15 mg total) by mouth daily. 30 tablet 3  . SYNTHROID 50 MCG tablet TAKE ONE (1) TABLET EACH DAY 30 tablet 11   No current facility-administered medications on file prior to visit.    Review of Systems  Constitutional: Positive for fatigue. Negative for appetite change and unexpected weight change.  HENT: Negative for congestion, ear pain and sinus pressure.   Eyes: Negative for visual disturbance.  Respiratory: Negative for cough, chest tightness and shortness of breath.   Cardiovascular: Negative for chest pain, palpitations and leg swelling.  Gastrointestinal: Negative for nausea, vomiting, abdominal pain and diarrhea.  Musculoskeletal: Positive for gait problem (some unstediness with gait.  ). Negative for joint swelling.  Skin: Negative for color change and rash.  Neurological: Positive for dizziness (in am when awakens and sits up. ) and light-headedness. Negative for syncope and headaches (head feels funny. ).  Psychiatric/Behavioral: Negative for confusion. The patient is not nervous/anxious.        Objective:    Physical Exam  Constitutional: She is oriented to person, place, and time. She appears well-developed and well-nourished. No distress.  HENT:  Nose:  Nose normal.  Mouth/Throat: Oropharynx is clear and moist.  Neck: Neck supple. No thyromegaly present.  Cardiovascular: Normal rate and regular rhythm.   Pulmonary/Chest: Breath sounds normal. No respiratory distress. She has no wheezes.  Abdominal: Soft. Bowel sounds are normal. There is no tenderness.  Musculoskeletal: She exhibits no edema or tenderness.  Lymphadenopathy:    She has no cervical adenopathy.  Neurological: She is alert and oriented to person, place, and time.  Skin: No rash noted. No erythema.    BP 140/60 mmHg  Pulse 78  Temp(Src) 98.3 F (36.8 C) (Oral)  Ht 4\' 10"  (1.473 m)  Wt 101 lb 12 oz (46.153 kg)  BMI 21.27 kg/m2  SpO2  97% Wt Readings from Last 3 Encounters:  09/12/14 101 lb 12 oz (46.153 kg)  08/01/14 104 lb 8 oz (47.401 kg)  06/23/14 105 lb 12 oz (47.968 kg)     Lab Results  Component Value Date   WBC 5.0 09/12/2014   HGB 11.8* 09/12/2014   HCT 35.0* 09/12/2014   PLT 187.0 09/12/2014   GLUCOSE 177* 09/12/2014   CHOL 209* 06/21/2014   TRIG 105.0 06/21/2014   HDL 77.00 06/21/2014   LDLDIRECT 107.1 07/29/2013   LDLCALC 111* 06/21/2014   ALT 16 06/21/2014   AST 25 06/21/2014   NA 141 09/12/2014   K 4.6 09/12/2014   CL 107 09/12/2014   CREATININE 0.91 09/12/2014   BUN 29* 09/12/2014   CO2 31 09/12/2014   TSH 1.81 07/29/2013   INR 1.0 01/25/2013   HGBA1C 7.2* 06/21/2014   MICROALBUR 115.0 Repeated and verified X2.* 07/29/2013       Assessment & Plan:   Problem List Items Addressed This Visit    Anemia    Recheck cbc today to confirm stable.        Relevant Orders   CBC with Differential/Platelet (Completed)   Ferritin (Completed)   Diabetes mellitus    Sugars have been doing well.  Follow.        Dizziness - Primary    Persistent - as outlined.  Has seen ENT.  Felt no evidence of BPPV on hallpike exam.  Tube in ear looked good.  Cardiology w/up unrevealing.  Blood pressure medication changed.  On hydralazine now.  Still seeing cardiology.  Not orthostatic on exam.  Appears to be worse in am (occurs in am when first gets up).  Head feels funny.  Given persistent symptoms, refer to ENT for further evaluation and treatment.        Relevant Orders   Ambulatory referral to Neurology   Hypercholesteremia    Low cholesterol diet and exercise.  Follow cholesterol panel.        Relevant Medications   hydrALAZINE (APRESOLINE) 50 MG tablet   Hypertension    Blood pressure as outlined.  On hydralazine now.  Follow.  Continues to follow with cardiology.        Relevant Medications   hydrALAZINE (APRESOLINE) 50 MG tablet   Other Relevant Orders   Basic metabolic panel (Completed)    Hypothyroidism    Follow tsh.        Peripheral vascular disease    Being followed by vascular surgery.  Stable.        Relevant Medications   hydrALAZINE (APRESOLINE) 50 MG tablet   Unsteady gait    Has a walker.  Has been to physical therapy.  Refer to neurology for further evaluation.        Relevant Orders  Ambulatory referral to Neurology    Other Visit Diagnoses    Need for prophylactic vaccination against Streptococcus pneumoniae (pneumococcus)        Relevant Orders    Pneumococcal conjugate vaccine 13-valent (Completed)      I spent 25 minutes with the patient and more than 50% of the time was spent in consultation regarding the above.     Einar Pheasant, MD

## 2014-09-13 ENCOUNTER — Encounter: Payer: Self-pay | Admitting: *Deleted

## 2014-09-16 ENCOUNTER — Encounter: Payer: Self-pay | Admitting: Internal Medicine

## 2014-09-16 NOTE — Assessment & Plan Note (Signed)
Blood pressure as outlined.  On hydralazine now.  Follow.  Continues to follow with cardiology.

## 2014-09-16 NOTE — Assessment & Plan Note (Signed)
Low cholesterol diet and exercise.  Follow cholesterol panel.

## 2014-09-16 NOTE — Assessment & Plan Note (Signed)
Persistent - as outlined.  Has seen ENT.  Felt no evidence of BPPV on hallpike exam.  Tube in ear looked good.  Cardiology w/up unrevealing.  Blood pressure medication changed.  On hydralazine now.  Still seeing cardiology.  Not orthostatic on exam.  Appears to be worse in am (occurs in am when first gets up).  Head feels funny.  Given persistent symptoms, refer to ENT for further evaluation and treatment.

## 2014-09-16 NOTE — Assessment & Plan Note (Signed)
Sugars have been doing well.  Follow.   

## 2014-09-16 NOTE — Assessment & Plan Note (Signed)
Follow tsh.  

## 2014-09-16 NOTE — Assessment & Plan Note (Signed)
Recheck cbc today to confirm stable.  

## 2014-09-16 NOTE — Assessment & Plan Note (Signed)
Has a walker.  Has been to physical therapy.  Refer to neurology for further evaluation.

## 2014-09-16 NOTE — Assessment & Plan Note (Signed)
Being followed by vascular surgery.  Stable.

## 2014-09-21 ENCOUNTER — Other Ambulatory Visit: Payer: Self-pay | Admitting: Internal Medicine

## 2014-09-26 ENCOUNTER — Other Ambulatory Visit: Payer: Self-pay | Admitting: Internal Medicine

## 2014-09-29 DIAGNOSIS — R42 Dizziness and giddiness: Secondary | ICD-10-CM | POA: Diagnosis not present

## 2014-09-29 DIAGNOSIS — R2689 Other abnormalities of gait and mobility: Secondary | ICD-10-CM | POA: Diagnosis not present

## 2014-10-06 ENCOUNTER — Encounter: Payer: Self-pay | Admitting: Neurology

## 2014-10-06 DIAGNOSIS — R42 Dizziness and giddiness: Secondary | ICD-10-CM | POA: Diagnosis not present

## 2014-10-10 ENCOUNTER — Encounter
Admit: 2014-10-10 | Disposition: A | Discharge status: Home / Self Care | Payer: Self-pay | Attending: Neurology | Admitting: Neurology

## 2014-10-18 DIAGNOSIS — R262 Difficulty in walking, not elsewhere classified: Secondary | ICD-10-CM | POA: Diagnosis not present

## 2014-10-18 DIAGNOSIS — R2681 Unsteadiness on feet: Secondary | ICD-10-CM | POA: Diagnosis not present

## 2014-10-18 DIAGNOSIS — M6281 Muscle weakness (generalized): Secondary | ICD-10-CM | POA: Diagnosis not present

## 2014-10-23 DIAGNOSIS — R262 Difficulty in walking, not elsewhere classified: Secondary | ICD-10-CM | POA: Diagnosis not present

## 2014-10-23 DIAGNOSIS — M6281 Muscle weakness (generalized): Secondary | ICD-10-CM | POA: Diagnosis not present

## 2014-10-23 DIAGNOSIS — R2681 Unsteadiness on feet: Secondary | ICD-10-CM | POA: Diagnosis not present

## 2014-10-24 ENCOUNTER — Other Ambulatory Visit: Payer: Self-pay | Admitting: Internal Medicine

## 2014-10-25 DIAGNOSIS — R2681 Unsteadiness on feet: Secondary | ICD-10-CM | POA: Diagnosis not present

## 2014-10-25 DIAGNOSIS — M6281 Muscle weakness (generalized): Secondary | ICD-10-CM | POA: Diagnosis not present

## 2014-10-25 DIAGNOSIS — R262 Difficulty in walking, not elsewhere classified: Secondary | ICD-10-CM | POA: Diagnosis not present

## 2014-10-30 DIAGNOSIS — R262 Difficulty in walking, not elsewhere classified: Secondary | ICD-10-CM | POA: Diagnosis not present

## 2014-10-30 DIAGNOSIS — M6281 Muscle weakness (generalized): Secondary | ICD-10-CM | POA: Diagnosis not present

## 2014-10-30 DIAGNOSIS — R2681 Unsteadiness on feet: Secondary | ICD-10-CM | POA: Diagnosis not present

## 2014-11-01 DIAGNOSIS — M6281 Muscle weakness (generalized): Secondary | ICD-10-CM | POA: Diagnosis not present

## 2014-11-01 DIAGNOSIS — R262 Difficulty in walking, not elsewhere classified: Secondary | ICD-10-CM | POA: Diagnosis not present

## 2014-11-01 DIAGNOSIS — R2681 Unsteadiness on feet: Secondary | ICD-10-CM | POA: Diagnosis not present

## 2014-11-06 DIAGNOSIS — M6281 Muscle weakness (generalized): Secondary | ICD-10-CM | POA: Diagnosis not present

## 2014-11-06 DIAGNOSIS — R262 Difficulty in walking, not elsewhere classified: Secondary | ICD-10-CM | POA: Diagnosis not present

## 2014-11-06 DIAGNOSIS — R2681 Unsteadiness on feet: Secondary | ICD-10-CM | POA: Diagnosis not present

## 2014-11-07 ENCOUNTER — Ambulatory Visit (INDEPENDENT_AMBULATORY_CARE_PROVIDER_SITE_OTHER): Payer: Medicare Other | Admitting: Internal Medicine

## 2014-11-07 ENCOUNTER — Telehealth: Payer: Self-pay | Admitting: Internal Medicine

## 2014-11-07 ENCOUNTER — Encounter: Payer: Self-pay | Admitting: Internal Medicine

## 2014-11-07 VITALS — BP 170/60 | HR 68 | Temp 98.3°F | Ht <= 58 in | Wt 104.2 lb

## 2014-11-07 DIAGNOSIS — R600 Localized edema: Secondary | ICD-10-CM | POA: Diagnosis not present

## 2014-11-07 DIAGNOSIS — R42 Dizziness and giddiness: Secondary | ICD-10-CM

## 2014-11-07 DIAGNOSIS — E538 Deficiency of other specified B group vitamins: Secondary | ICD-10-CM | POA: Diagnosis not present

## 2014-11-07 DIAGNOSIS — I10 Essential (primary) hypertension: Secondary | ICD-10-CM

## 2014-11-07 MED ORDER — AMLODIPINE BESYLATE 5 MG PO TABS: 5.0000 mg | ORAL_TABLET | Freq: Every day | ORAL | Status: DC

## 2014-11-07 MED ORDER — CYANOCOBALAMIN 1000 MCG/ML IJ SOLN
1000.0000 ug | Freq: Once | INTRAMUSCULAR | Status: AC
  Administered 2014-11-07: 1000 ug via INTRAMUSCULAR

## 2014-11-07 NOTE — Telephone Encounter (Signed)
Please call pt.  Since pressure over 200, is she having any other acute symptoms.  Have they rechecked her pressure.  Has she seen cardiology recently and have they made any changes recently in her blood pressure medication.  If any acute symptoms, let me know otherwise have her come on in to be worked in between pts for her high blood pressure.

## 2014-11-07 NOTE — Telephone Encounter (Signed)
Records received

## 2014-11-07 NOTE — Telephone Encounter (Signed)
Tara [name] - Please let me know if there is any additional information I need to know about her. Thanks

## 2014-11-07 NOTE — Progress Notes (Signed)
Pre visit review using our clinic review tool, if applicable. No additional management support is needed unless otherwise documented below in the visit note. 

## 2014-11-07 NOTE — Telephone Encounter (Signed)
Spoke with patient & she is going to get here as soon as her daughter get pick her up. She hasn't checked her BP anymore since she spoke with Triage. And pt reported that she felt swimmy headed yesterday but not today. Denies dizziness, headache, blurred vision, etc. Records also requested from Dr. Humphrey Rolls (Cardiology).

## 2014-11-07 NOTE — Telephone Encounter (Signed)
Thanks.  Can close unless you wanted to hold until records received.

## 2014-11-07 NOTE — Telephone Encounter (Signed)
[facility] Patient Name: Tara [name] DOB: August 05, 1918 Initial Comment Caller states her BP is 203/71. 200/80. Nurse Assessment Nurse: Erlene Quan, RN, Manuela Schwartz Date/Time Eilene Ghazi Time): 11/07/2014 11:04:42 AM Confirm and document reason for call. If symptomatic, describe symptoms. ---Caller states her BP is 203/71 in L arm and 200/80 in the R arm - states she has had some weakness but nothing that is not normal for her - states she went for her therapy and they would not see her because her BP was high - she is due to go back tomorrow and is afraid if she does not get it down they will not see her again or at least talk to Dr Nicki Reaper about it Has the patient traveled out of the country within the last 30 days? ---Not Applicable Does the patient require triage? ---Yes Related visit to physician within the last 2 weeks? ---No Does the PT have any chronic conditions? (i.e. diabetes, asthma, etc.) ---Yes List chronic conditions. ---high blood pressure diabetic Guidelines Guideline Title Affirmed Question Affirmed Notes High Blood Pressure BP # 160/100 Final Disposition User See PCP When Office is Open (within 3 days) Erlene Quan, RN, Manuela Schwartz Comments appointment scheduled with Ronette Deter at 3:00 pm this afternoon - advised pt I knew she wanted to see Dr Nicki Reaper but she had no opening and we really needed to get her seen before her next therapy - she agreed - states she has an appointment with Dr Nicki Reaper on April 11th - that was checked and advised her that her appointment is on April 14th not the 11th and that she needs to keep that appointment - she verbalized understanding - her dgt will bring her in at 3 today

## 2014-11-07 NOTE — Telephone Encounter (Signed)
FYI: Seeing Dr. Gilford Rile today a 3pm

## 2014-11-08 ENCOUNTER — Encounter: Payer: Self-pay | Admitting: Internal Medicine

## 2014-11-08 LAB — BASIC METABOLIC PANEL
BUN: 32 mg/dL — AB (ref 6–23)
CO2: 31 mEq/L (ref 19–32)
Calcium: 9.7 mg/dL (ref 8.4–10.5)
Chloride: 105 mEq/L (ref 96–112)
Creatinine, Ser: 1.01 mg/dL (ref 0.40–1.20)
GFR: 65.54 mL/min (ref >60.00)
GLUCOSE: 137 mg/dL — AB (ref 70–99)
Potassium: 4.3 mEq/L (ref 3.5–5.1)
Sodium: 140 mEq/L (ref 135–145)

## 2014-11-08 NOTE — Assessment & Plan Note (Signed)
B12 injection given today. 

## 2014-11-08 NOTE — Progress Notes (Signed)
Patient ID: Tara [name], female   DOB: [birth year], 81 y.o.   MRN: [id number]   Subjective:    Patient ID: Tara [name], female    DOB: [birth year], 81 y.o.   MRN: [id number]  HPI  Patient here as a work in with concerns regarding elevated blood pressure.  States her blood pressure has been elevated recently.  States some readings are averaging 99991111 systolic readings.  Was not able to do therapy today secondary to elevated blood pressure.  States systolic bp this am was [id number].  Had some mild light headedness yesterday.  No dizziness today.  Actually states that her dizziness/light headedness is better.  No headache.  No chest pain or tightness.  No sob.  Breathing stable.    Past Medical History  Diagnosis Date  . Asthma   . Diabetes mellitus   . Environmental allergies   . Hypertension     Current Outpatient Prescriptions on File Prior to Visit  Medication Sig Dispense Refill  . acetaminophen (TYLENOL) 325 MG tablet Take 650 mg by mouth every 6 (six) hours as needed.    . Calcium Carbonate-Vitamin D (CALTRATE 600+D PO) Take by mouth.    . Cholecalciferol (D3 SUPER STRENGTH) 2000 UNITS CAPS Take by mouth.    . fexofenadine (ALLEGRA) 180 MG tablet Take 180 mg by mouth daily.    . fluticasone (FLONASE) 50 MCG/ACT nasal spray Place 2 sprays into both nostrils daily. 16 g 5  . glucose blood (ACCU-CHEK AVIVA PLUS) test strip Check sugar daily Dx. 250.00 100 each 2  . hydrALAZINE (APRESOLINE) 50 MG tablet Take 50 mg by mouth 2 (two) times daily.    Marland Kitchen LANTUS 100 UNIT/ML injection INJECT 6 UNITS INTO THE SKIN DAILY 10 mL 3  . LANTUS SOLOSTAR 100 UNIT/ML Solostar Pen INJECT 6 UNITS UNDER THE SKIN ONCE DAILY 3 mL 5  . metoprolol succinate (TOPROL-XL) 25 MG 24 hr tablet TAKE ONE (1) TABLET EACH DAY 30 tablet 5  . pioglitazone (ACTOS) 15 MG tablet TAKE ONE (1) TABLET EACH DAY 30 tablet 5  . SYNTHROID 50 MCG tablet TAKE ONE (1) TABLET BY MOUTH EVERY DAY 30 tablet 0   No current facility-administered  medications on file prior to visit.    Review of Systems  Constitutional: Negative for appetite change and unexpected weight change.  HENT: Negative for congestion and sinus pressure.        Minimal allergy symptoms.   Eyes: Negative for visual disturbance.  Respiratory: Negative for cough, chest tightness and shortness of breath.   Cardiovascular: Negative for chest pain, palpitations and leg swelling.  Gastrointestinal: Negative for nausea, vomiting and abdominal pain.  Neurological: Positive for light-headedness (none now.  noticed some mild light headedness yesterday.  ). Negative for dizziness (dizziness better.  ) and headaches.       Objective:     Blood pressure recheck:  172/68  Physical Exam  Constitutional: She appears well-developed and well-nourished. No distress.  HENT:  Nose: Nose normal.  Mouth/Throat: Oropharynx is clear and moist.  Neck: Neck supple. No thyromegaly present.  Cardiovascular: Normal rate and regular rhythm.   Pulmonary/Chest: Breath sounds normal. No respiratory distress. She has no wheezes.  Abdominal: Soft. Bowel sounds are normal. There is no tenderness.  Musculoskeletal: She exhibits no edema or tenderness.  Lymphadenopathy:    She has no cervical adenopathy.  Skin: No rash noted. No erythema.    BP 170/60 mmHg  Pulse 68  Temp(Src) 98.3 F (36.8 C) (Oral)  Ht 4\' 10"  (1.473 m)  Wt 104 lb 4 oz (47.287 kg)  BMI 21.79 kg/m2  SpO2 97% Wt Readings from Last 3 Encounters:  11/07/14 104 lb 4 oz (47.287 kg)  09/12/14 101 lb 12 oz (46.153 kg)  08/01/14 104 lb 8 oz (47.401 kg)     Lab Results  Component Value Date   WBC 5.0 09/12/2014   HGB 11.8* 09/12/2014   HCT 35.0* 09/12/2014   PLT 187.0 09/12/2014   GLUCOSE 177* 09/12/2014   CHOL 209* 06/21/2014   TRIG 105.0 06/21/2014   HDL 77.00 06/21/2014   LDLDIRECT 107.1 07/29/2013   LDLCALC 111* 06/21/2014   ALT 16 06/21/2014   AST 25 06/21/2014   NA 141 09/12/2014   K 4.6 09/12/2014    CL 107 09/12/2014   CREATININE 0.91 09/12/2014   BUN 29* 09/12/2014   CO2 31 09/12/2014   TSH 1.81 07/29/2013   INR 1.0 01/25/2013   HGBA1C 7.2* 06/21/2014   MICROALBUR 115.0 Repeated and verified X2.* 07/29/2013       Assessment & Plan:   Problem List Items Addressed This Visit    B12 deficiency    B12 injection given today.       Relevant Medications   cyanocobalamin ((VITAMIN B-12)) injection 1,000 mcg (Completed)   Dizziness    Had some minimal dizziness yesterday.  Overall she feels this has improved.  Desires no further testing at this point.  Follow.  Treat blood pressure.  Get her back in soon to reassess.        Hypertension - Primary    Increased blood pressure.  Elevated more recently.  Currently with no headache or dizziness.  Discussed further w/up.  Will start amlodipine 5mg  q day.  Has previously taken this (for years) and tolerated.  Continue hydralazine.  Avoid diuretics.  Check metabolic panel.  Get her back in soon to reassess.  Have her monitor her blood pressure.        Relevant Medications   aspirin 81 MG tablet   amLODIpine (NORVASC) tablet   Other Relevant Orders   Basic metabolic panel   Lower extremity edema    Resolved.          I spent 25 minutes with the patient and more than 50% of the time was spent in consultation regarding the above.     Einar Pheasant, MD

## 2014-11-08 NOTE — Assessment & Plan Note (Signed)
Increased blood pressure.  Elevated more recently.  Currently with no headache or dizziness.  Discussed further w/up.  Will start amlodipine 5mg  q day.  Has previously taken this (for years) and tolerated.  Continue hydralazine.  Avoid diuretics.  Check metabolic panel.  Get her back in soon to reassess.  Have her monitor her blood pressure.

## 2014-11-08 NOTE — Assessment & Plan Note (Addendum)
Had some minimal dizziness yesterday.  Overall she feels this has improved.  Desires no further testing at this point.  Follow.  Treat blood pressure.  Get her back in soon to reassess.

## 2014-11-08 NOTE — Assessment & Plan Note (Signed)
Resolved

## 2014-11-09 ENCOUNTER — Encounter: Payer: Self-pay | Admitting: *Deleted

## 2014-11-10 ENCOUNTER — Encounter
Admit: 2014-11-10 | Disposition: A | Discharge status: Home / Self Care | Payer: Self-pay | Attending: Neurology | Admitting: Neurology

## 2014-11-13 DIAGNOSIS — R262 Difficulty in walking, not elsewhere classified: Secondary | ICD-10-CM | POA: Diagnosis not present

## 2014-11-13 DIAGNOSIS — M6281 Muscle weakness (generalized): Secondary | ICD-10-CM | POA: Diagnosis not present

## 2014-11-13 DIAGNOSIS — R42 Dizziness and giddiness: Secondary | ICD-10-CM | POA: Diagnosis not present

## 2014-11-13 DIAGNOSIS — R2681 Unsteadiness on feet: Secondary | ICD-10-CM | POA: Diagnosis not present

## 2014-11-15 DIAGNOSIS — R42 Dizziness and giddiness: Secondary | ICD-10-CM | POA: Diagnosis not present

## 2014-11-15 DIAGNOSIS — M6281 Muscle weakness (generalized): Secondary | ICD-10-CM | POA: Diagnosis not present

## 2014-11-15 DIAGNOSIS — R2681 Unsteadiness on feet: Secondary | ICD-10-CM | POA: Diagnosis not present

## 2014-11-15 DIAGNOSIS — R262 Difficulty in walking, not elsewhere classified: Secondary | ICD-10-CM | POA: Diagnosis not present

## 2014-11-16 ENCOUNTER — Other Ambulatory Visit: Payer: Self-pay | Admitting: Internal Medicine

## 2014-11-20 DIAGNOSIS — M6281 Muscle weakness (generalized): Secondary | ICD-10-CM | POA: Diagnosis not present

## 2014-11-20 DIAGNOSIS — R42 Dizziness and giddiness: Secondary | ICD-10-CM | POA: Diagnosis not present

## 2014-11-20 DIAGNOSIS — R262 Difficulty in walking, not elsewhere classified: Secondary | ICD-10-CM | POA: Diagnosis not present

## 2014-11-20 DIAGNOSIS — R2681 Unsteadiness on feet: Secondary | ICD-10-CM | POA: Diagnosis not present

## 2014-11-22 DIAGNOSIS — M6281 Muscle weakness (generalized): Secondary | ICD-10-CM | POA: Diagnosis not present

## 2014-11-22 DIAGNOSIS — R2681 Unsteadiness on feet: Secondary | ICD-10-CM | POA: Diagnosis not present

## 2014-11-22 DIAGNOSIS — R262 Difficulty in walking, not elsewhere classified: Secondary | ICD-10-CM | POA: Diagnosis not present

## 2014-11-22 DIAGNOSIS — R42 Dizziness and giddiness: Secondary | ICD-10-CM | POA: Diagnosis not present

## 2014-11-23 ENCOUNTER — Ambulatory Visit (INDEPENDENT_AMBULATORY_CARE_PROVIDER_SITE_OTHER): Payer: Medicare Other | Admitting: Internal Medicine

## 2014-11-23 ENCOUNTER — Encounter: Payer: Self-pay | Admitting: Internal Medicine

## 2014-11-23 VITALS — BP 150/60 | HR 66 | Temp 98.5°F | Ht <= 58 in | Wt 102.4 lb

## 2014-11-23 DIAGNOSIS — E7800 Pure hypercholesterolemia, unspecified: Secondary | ICD-10-CM

## 2014-11-23 DIAGNOSIS — I10 Essential (primary) hypertension: Secondary | ICD-10-CM | POA: Diagnosis not present

## 2014-11-23 DIAGNOSIS — R600 Localized edema: Secondary | ICD-10-CM

## 2014-11-23 DIAGNOSIS — R42 Dizziness and giddiness: Secondary | ICD-10-CM

## 2014-11-23 DIAGNOSIS — D649 Anemia, unspecified: Secondary | ICD-10-CM

## 2014-11-23 DIAGNOSIS — E1159 Type 2 diabetes mellitus with other circulatory complications: Secondary | ICD-10-CM

## 2014-11-23 DIAGNOSIS — R2681 Unsteadiness on feet: Secondary | ICD-10-CM

## 2014-11-23 NOTE — Progress Notes (Signed)
Patient ID: Tara [name], female   DOB: 15-Aug-1917, 81 y.o.   MRN: [id number]   Subjective:    Patient ID: Tara [name], female    DOB: April 01, 1918, 81 y.o.   MRN: [id number]  HPI  Patient here for a scheduled follow up.  Here to f/u on her blood pressure.  Appears to be doing better.  Taking her medication and tolerating.  Dizziness and light headedness is better.  Does not feel off balance.  No headache.  No chest pain or tightness.  Breathing stable.     Past Medical History  Diagnosis Date  . Asthma   . Diabetes mellitus   . Environmental allergies   . Hypertension     Current Outpatient Prescriptions on File Prior to Visit  Medication Sig Dispense Refill  . acetaminophen (TYLENOL) 325 MG tablet Take 650 mg by mouth every 6 (six) hours as needed.    Marland Kitchen amLODipine (NORVASC) 5 MG tablet Take 1 tablet (5 mg total) by mouth daily. 30 tablet 1  . aspirin 81 MG tablet Take 81 mg by mouth daily.    . Calcium Carbonate-Vitamin D (CALTRATE 600+D PO) Take by mouth.    . Cholecalciferol (D3 SUPER STRENGTH) 2000 UNITS CAPS Take by mouth.    . fexofenadine (ALLEGRA) 180 MG tablet Take 180 mg by mouth daily.    . fluticasone (FLONASE) 50 MCG/ACT nasal spray Place 2 sprays into both nostrils daily. 16 g 5  . glucose blood (ACCU-CHEK AVIVA PLUS) test strip Check sugar daily Dx. 250.00 100 each 2  . hydrALAZINE (APRESOLINE) 50 MG tablet Take 50 mg by mouth 2 (two) times daily.    Marland Kitchen LANTUS 100 UNIT/ML injection INJECT 6 UNITS INTO THE SKIN DAILY 10 mL 3  . LANTUS SOLOSTAR 100 UNIT/ML Solostar Pen INJECT 6 UNITS UNDER THE SKIN ONCE DAILY 3 mL 5  . metoprolol succinate (TOPROL-XL) 25 MG 24 hr tablet TAKE ONE (1) TABLET EACH DAY 30 tablet 5  . pioglitazone (ACTOS) 15 MG tablet TAKE ONE (1) TABLET EACH DAY 30 tablet 5   No current facility-administered medications on file prior to visit.    Review of Systems  Constitutional: Negative for appetite change and unexpected weight change.  HENT:  Negative for congestion and sinus pressure.   Respiratory: Negative for cough, chest tightness and shortness of breath.   Cardiovascular: Negative for chest pain, palpitations and leg swelling.  Gastrointestinal: Negative for nausea, vomiting, abdominal pain and diarrhea.  Neurological: Negative for dizziness, light-headedness (improved. ) and headaches.       Does not feel unsteady now.    Psychiatric/Behavioral: Negative for dysphoric mood and agitation.       Objective:    Physical Exam  BP 150/60 mmHg  Pulse 66  Temp(Src) 98.5 F (36.9 C) (Oral)  Ht 4\' 10"  (1.473 m)  Wt 102 lb 6 oz (46.437 kg)  BMI 21.40 kg/m2  SpO2 97% Wt Readings from Last 3 Encounters:  11/23/14 102 lb 6 oz (46.437 kg)  11/07/14 104 lb 4 oz (47.287 kg)  09/12/14 101 lb 12 oz (46.153 kg)     Lab Results  Component Value Date   WBC 5.0 09/12/2014   HGB 11.8* 09/12/2014   HCT 35.0* 09/12/2014   PLT 187.0 09/12/2014   GLUCOSE 137* 11/07/2014   CHOL 209* 06/21/2014   TRIG 105.0 06/21/2014   HDL 77.00 06/21/2014   LDLDIRECT 107.1 07/29/2013   LDLCALC 111* 06/21/2014   ALT 16 06/21/2014  AST 25 06/21/2014   NA 140 11/07/2014   K 4.3 11/07/2014   CL 105 11/07/2014   CREATININE 1.01 11/07/2014   BUN 32* 11/07/2014   CO2 31 11/07/2014   TSH 1.81 07/29/2013   INR 1.0 01/25/2013   HGBA1C 7.2* 06/21/2014   MICROALBUR 115.0 Repeated and verified X2.* 07/29/2013       Assessment & Plan:   Problem List Items Addressed This Visit    Anemia    Hgb stable.  Follow cbc.        Diabetes mellitus    Sugars have been ok.  Follow sugars.  Want to avoid low blood sugars.        Dizziness    Not an issue now.  Follow.       Hypercholesteremia    Low cholesterol diet.  Will follow.       Hypertension - Primary    Blood pressure as outlined.  Doing better.  Same medication regimen.  Follow metabolic panel.        Lower extremity edema    No significant swelling noted.        Unsteady  gait    Improved.          I spent 25 minutes with the patient and more than 50% of the time was spent in consultation regarding the above.     Einar Pheasant, MD

## 2014-11-23 NOTE — Progress Notes (Signed)
Pre visit review using our clinic review tool, if applicable. No additional management support is needed unless otherwise documented below in the visit note. 

## 2014-11-24 ENCOUNTER — Other Ambulatory Visit: Payer: Self-pay | Admitting: Internal Medicine

## 2014-11-27 DIAGNOSIS — R2681 Unsteadiness on feet: Secondary | ICD-10-CM | POA: Diagnosis not present

## 2014-11-27 DIAGNOSIS — M6281 Muscle weakness (generalized): Secondary | ICD-10-CM | POA: Diagnosis not present

## 2014-11-27 DIAGNOSIS — R42 Dizziness and giddiness: Secondary | ICD-10-CM | POA: Diagnosis not present

## 2014-11-27 DIAGNOSIS — R262 Difficulty in walking, not elsewhere classified: Secondary | ICD-10-CM | POA: Diagnosis not present

## 2014-11-29 DIAGNOSIS — M6281 Muscle weakness (generalized): Secondary | ICD-10-CM | POA: Diagnosis not present

## 2014-11-29 DIAGNOSIS — R262 Difficulty in walking, not elsewhere classified: Secondary | ICD-10-CM | POA: Diagnosis not present

## 2014-11-29 DIAGNOSIS — R42 Dizziness and giddiness: Secondary | ICD-10-CM | POA: Diagnosis not present

## 2014-11-29 DIAGNOSIS — R2681 Unsteadiness on feet: Secondary | ICD-10-CM | POA: Diagnosis not present

## 2014-11-30 ENCOUNTER — Encounter: Payer: Self-pay | Admitting: Internal Medicine

## 2014-11-30 NOTE — Assessment & Plan Note (Signed)
Hgb stable.  Follow cbc.

## 2014-11-30 NOTE — Assessment & Plan Note (Signed)
Not an issue now.  Follow.    

## 2014-11-30 NOTE — Assessment & Plan Note (Signed)
Blood pressure as outlined.  Doing better.  Same medication regimen.  Follow metabolic panel.

## 2014-11-30 NOTE — Assessment & Plan Note (Signed)
Low cholesterol diet.  Will follow.

## 2014-11-30 NOTE — Assessment & Plan Note (Signed)
Sugars have been ok.  Follow sugars.  Want to avoid low blood sugars.

## 2014-11-30 NOTE — Assessment & Plan Note (Signed)
No significant swelling noted.

## 2014-11-30 NOTE — Assessment & Plan Note (Signed)
Improved

## 2015-01-24 ENCOUNTER — Encounter: Payer: Self-pay | Admitting: *Deleted

## 2015-01-24 DIAGNOSIS — H35373 Puckering of macula, bilateral: Secondary | ICD-10-CM | POA: Diagnosis not present

## 2015-01-24 LAB — HM DIABETES EYE EXAM

## 2015-02-02 ENCOUNTER — Ambulatory Visit (INDEPENDENT_AMBULATORY_CARE_PROVIDER_SITE_OTHER)
Admission: RE | Admit: 2015-02-02 | Discharge: 2015-02-02 | Disposition: A | Discharge status: Home / Self Care | Payer: Medicare Other | Source: Ambulatory Visit | Attending: Family Medicine | Admitting: Family Medicine

## 2015-02-02 ENCOUNTER — Encounter: Payer: Self-pay | Admitting: Family Medicine

## 2015-02-02 ENCOUNTER — Ambulatory Visit (INDEPENDENT_AMBULATORY_CARE_PROVIDER_SITE_OTHER): Payer: Medicare Other | Admitting: Family Medicine

## 2015-02-02 VITALS — BP 180/69 | HR 90 | Temp 98.7°F | Wt 99.2 lb

## 2015-02-02 DIAGNOSIS — M542 Cervicalgia: Secondary | ICD-10-CM

## 2015-02-02 DIAGNOSIS — M5032 Other cervical disc degeneration, mid-cervical region: Secondary | ICD-10-CM | POA: Diagnosis not present

## 2015-02-02 MED ORDER — CYCLOBENZAPRINE HCL 5 MG PO TABS: 5.0000 mg | ORAL_TABLET | Freq: Two times a day (BID) | ORAL | Status: DC | PRN

## 2015-02-02 MED ORDER — MELOXICAM 15 MG PO TABS: 15.0000 mg | ORAL_TABLET | Freq: Every day | ORAL | Status: DC

## 2015-02-02 NOTE — Progress Notes (Signed)
   Subjective:    Patient ID: Tara [name], female    DOB: 10-Dec-1917, 81 y.o.   MRN: [id number]  HPI   81 year old female pt of Dr. Bary Leriche with history of HTN, DM, Anemia, low thyroid, PVD presents with new onset pain in left neck and left upper shoulder. Constant pain. Pain worse  with moving head. No pain radiating to hand, no numbness, no weakness. Pain started 2 days ago.  No new fall. Only proceeding symptoms cold symptoms, runny nose.     No history of neck issues, no hx of neck or back surgeries.  She does have some chronic low back pain off and on.  No osteoporosis, no bone density seen in chart though. On calcium  Sleeps in recliner during day per daughter with neck sloped to side.  Used tylenol for pain and arthritis patch.     Review of Systems  Constitutional: Negative for fever and fatigue.  HENT: Negative for ear pain.   Eyes: Negative for pain.  Respiratory: Negative for chest tightness and shortness of breath.   Cardiovascular: Negative for chest pain, palpitations and leg swelling.  Gastrointestinal: Negative for abdominal pain.  Genitourinary: Negative for dysuria.       Objective:   Physical Exam  Constitutional: Vital signs are normal. She appears well-developed and well-nourished. She is cooperative.  Non-toxic appearance. She does not appear ill. No distress.  Elderly female in NAD  HENT:  Head: Normocephalic.  Right Ear: Hearing, tympanic membrane, external ear and ear canal normal. Tympanic membrane is not erythematous, not retracted and not bulging.  Left Ear: Hearing, tympanic membrane, external ear and ear canal normal. Tympanic membrane is not erythematous, not retracted and not bulging.  Nose: No mucosal edema or rhinorrhea. Right sinus exhibits no maxillary sinus tenderness and no frontal sinus tenderness. Left sinus exhibits no maxillary sinus tenderness and no frontal sinus tenderness.  Mouth/Throat: Uvula is midline, oropharynx is clear and  moist and mucous membranes are normal.  Eyes: Conjunctivae, EOM and lids are normal. Pupils are equal, round, and reactive to light. Lids are everted and swept, no foreign bodies found.  Neck: Trachea normal. Spinous process tenderness and muscular tenderness present. Carotid bruit is not present. No rigidity. Decreased range of motion present. No thyroid mass and no thyromegaly present.  Neg spurling's  Cardiovascular: Normal rate, regular rhythm, S1 normal, S2 normal, normal heart sounds, intact distal pulses and normal pulses.  Exam reveals no gallop and no friction rub.   No murmur heard. Pulmonary/Chest: Effort normal and breath sounds normal. No tachypnea. No respiratory distress. She has no decreased breath sounds. She has no wheezes. She has no rhonchi. She has no rales.  Abdominal: Soft. Normal appearance and bowel sounds are normal. There is no tenderness.  Musculoskeletal:       Cervical back: She exhibits decreased range of motion, tenderness and bony tenderness. She exhibits no swelling.  Neurological: She is alert. She has normal strength. Gait normal.  Skin: Skin is warm, dry and intact. No rash noted.  Psychiatric: Her speech is normal and behavior is normal. Judgment and thought content normal. Her mood appears not anxious. Cognition and memory are normal. She does not exhibit a depressed mood.          Assessment & Plan:

## 2015-02-02 NOTE — Assessment & Plan Note (Signed)
Given age, focal ttp and ? Osteoporosis, will eval with X-ray for fracture.  Most likely MSK strain and torticollis.  No clear sign of radiculopathy or shoulder pathology.  Will treat with NSAID and low dose muscle relaxant.  Start heat, massage and home stretching. Follow up if not improving in 2 weeks.

## 2015-02-02 NOTE — Patient Instructions (Addendum)
Start meloxicam daily for pain and inflammation.  Can use muscle relaxant at night.  Apply heat to neck.  Gentle massage.  Start gentle range of motion exercises.  We will call with X-ray results.  Follow up if not improving in 2 weeks.

## 2015-02-02 NOTE — Progress Notes (Signed)
Pre visit review using our clinic review tool, if applicable. No additional management support is needed unless otherwise documented below in the visit note. 

## 2015-03-05 ENCOUNTER — Encounter: Payer: Self-pay | Admitting: Internal Medicine

## 2015-03-05 ENCOUNTER — Ambulatory Visit (INDEPENDENT_AMBULATORY_CARE_PROVIDER_SITE_OTHER): Payer: Medicare Other | Admitting: Internal Medicine

## 2015-03-05 VITALS — BP 158/60 | HR 67 | Temp 98.5°F | Resp 14 | Ht <= 58 in | Wt 97.2 lb

## 2015-03-05 DIAGNOSIS — E538 Deficiency of other specified B group vitamins: Secondary | ICD-10-CM

## 2015-03-05 DIAGNOSIS — D649 Anemia, unspecified: Secondary | ICD-10-CM

## 2015-03-05 DIAGNOSIS — I10 Essential (primary) hypertension: Secondary | ICD-10-CM

## 2015-03-05 DIAGNOSIS — E1159 Type 2 diabetes mellitus with other circulatory complications: Secondary | ICD-10-CM

## 2015-03-05 DIAGNOSIS — R634 Abnormal weight loss: Secondary | ICD-10-CM | POA: Diagnosis not present

## 2015-03-05 DIAGNOSIS — E039 Hypothyroidism, unspecified: Secondary | ICD-10-CM

## 2015-03-05 DIAGNOSIS — E7800 Pure hypercholesterolemia, unspecified: Secondary | ICD-10-CM

## 2015-03-05 DIAGNOSIS — M542 Cervicalgia: Secondary | ICD-10-CM

## 2015-03-05 DIAGNOSIS — I739 Peripheral vascular disease, unspecified: Secondary | ICD-10-CM

## 2015-03-05 NOTE — Progress Notes (Signed)
Patient ID: Tara [name], female   DOB: [birth year], 81 y.o.   MRN: [id number]   Subjective:    Patient ID: Tara [name], female    DOB: 20-Apr-1918, 81 y.o.   MRN: [id number]  HPI  Patient here for a scheduled follow up.  She is accompanied by her daughter.  History obtained from both of them.  She has noticed some neck discomfort.  When she looks to the left - pulling sensation - posterior neck.  She was evaluated at [facility].  See their note for details.  Was given flexeril.  States this medication made her feel funny.  Off not.  Neck did get better.  Has flared up again recently.  Daughter informs me that she would fall asleep in a chair and have her neck in a strained position.  They just got her a lift chair.  Better posture with this.  Tylenol helps when she takes it.  Rarely takes.  Reports some decreased appetite.  Blood sugar doing well.  114 this am.  No low blood sugars.  Breathing stable.  She if off the hydralazine, secondary to confusion with refills.     Past Medical History  Diagnosis Date  . Asthma   . Diabetes mellitus   . Environmental allergies   . Hypertension     Outpatient Encounter Prescriptions as of 03/05/2015  Medication Sig  . acetaminophen (TYLENOL) 325 MG tablet Take 650 mg by mouth every 6 (six) hours as needed.  Marland Kitchen amLODipine (NORVASC) 5 MG tablet Take 1 tablet (5 mg total) by mouth daily.  Marland Kitchen aspirin 81 MG tablet Take 81 mg by mouth daily.  . Calcium Carbonate-Vitamin D (CALTRATE 600+D PO) Take by mouth.  . Cholecalciferol (D3 SUPER STRENGTH) 2000 UNITS CAPS Take by mouth.  . cyclobenzaprine (FLEXERIL) 5 MG tablet Take 1 tablet (5 mg total) by mouth 3 times/day as needed-between meals & bedtime for muscle spasms.  . fexofenadine (ALLEGRA) 180 MG tablet Take 180 mg by mouth daily.  . fluticasone (FLONASE) 50 MCG/ACT nasal spray Place 2 sprays into both nostrils daily.  Marland Kitchen glucose blood (ACCU-CHEK AVIVA PLUS) test strip Check sugar daily Dx. 250.00  .  hydrALAZINE (APRESOLINE) 50 MG tablet Take 50 mg by mouth 2 (two) times daily.  Marland Kitchen LANTUS 100 UNIT/ML injection INJECT 6 UNITS INTO THE SKIN DAILY  . LANTUS SOLOSTAR 100 UNIT/ML Solostar Pen INJECT 6 UNITS UNDER THE SKIN ONCE DAILY  . meloxicam (MOBIC) 15 MG tablet Take 1 tablet (15 mg total) by mouth daily.  . metoprolol succinate (TOPROL-XL) 25 MG 24 hr tablet TAKE ONE (1) TABLET EACH DAY  . pioglitazone (ACTOS) 15 MG tablet TAKE ONE (1) TABLET EACH DAY  . SYNTHROID 50 MCG tablet TAKE ONE (1) TABLET EACH DAY   No facility-administered encounter medications on file as of 03/05/2015.    Review of Systems  Constitutional: Positive for appetite change (some decreased appetite.  ).       Some weight loss.   HENT: Negative for congestion and sinus pressure.   Respiratory: Negative for cough, chest tightness and shortness of breath.   Cardiovascular: Negative for chest pain, palpitations and leg swelling.  Gastrointestinal: Negative for nausea, vomiting, abdominal pain and diarrhea.  Musculoskeletal: Positive for neck pain (neck discomfort as outlined.  ).  Skin: Negative for color change and rash.  Neurological: Negative for dizziness, light-headedness and headaches.  Psychiatric/Behavioral: Negative for dysphoric mood and agitation.       Objective:  Blood pressure recheck:  152/68  Physical Exam  Constitutional: No distress.  HENT:  Nose: Nose normal.  Mouth/Throat: Oropharynx is clear and moist.  Neck: Neck supple. No thyromegaly present.  Cardiovascular: Normal rate and regular rhythm.   Pulmonary/Chest: Breath sounds normal. No respiratory distress. She has no wheezes.  Abdominal: Soft. Bowel sounds are normal. There is no tenderness.  Musculoskeletal: She exhibits no edema or tenderness.  Increased discomfort with rotation of her head to the left.   Lymphadenopathy:    She has no cervical adenopathy.  Skin: No rash noted. No erythema.  Psychiatric: She has a normal mood  and affect. Her behavior is normal.    BP 158/60 mmHg  Pulse 67  Temp(Src) 98.5 F (36.9 C) (Oral)  Resp 14  Ht 4\' 10"  (1.473 m)  Wt 97 lb 3.2 oz (44.09 kg)  BMI 20.32 kg/m2  SpO2 97% Wt Readings from Last 3 Encounters:  03/05/15 97 lb 3.2 oz (44.09 kg)  02/02/15 99 lb 4 oz (45.02 kg)  11/23/14 102 lb 6 oz (46.437 kg)     Lab Results  Component Value Date   WBC 6.2 03/05/2015   HGB 10.3* 03/05/2015   HCT 30.9* 03/05/2015   PLT 261.0 03/05/2015   GLUCOSE 77 03/05/2015   CHOL 209* 06/21/2014   TRIG 105.0 06/21/2014   HDL 77.00 06/21/2014   LDLDIRECT 107.1 07/29/2013   LDLCALC 111* 06/21/2014   ALT 12 03/05/2015   AST 20 03/05/2015   NA 145 03/05/2015   K 4.0 03/05/2015   CL 106 03/05/2015   CREATININE 0.82 03/05/2015   BUN 24* 03/05/2015   CO2 29 03/05/2015   TSH 1.13 03/05/2015   INR 1.0 01/25/2013   HGBA1C 6.6* 03/05/2015   MICROALBUR 115.0 Repeated and verified X2.* 07/29/2013    Dg Cervical Spine Complete  02/02/2015   CLINICAL DATA:  Acute neck pain without reported injury.  EXAM: CERVICAL SPINE  4+ VIEWS  COMPARISON:  None.  FINDINGS: Only the first 6 cervical vertebra are completely visualized on this study. Swimmer's projection could not be obtained and therefore C7 is incompletely visualized. Grade 1 anterolisthesis of C4-5 is noted most likely due to posterior facet joint degenerative change. Degenerative disc disease is also noted at C3-4, C5-6 C6-7 with anterior osteophyte formation.  IMPRESSION: Grade 1 anterolisthesis of C4-5 is noted most likely due to posterior facet joint hypertrophy. No definite fracture is noted, although C7 is incompletely visualized as a swimmer's projection could not be obtained. If there is concern for traumatic injury, CT scan of the cervical spine would be recommended for further evaluation.   Electronically Signed   By: Marijo Conception, M.D.   On: 02/02/2015 12:45       Assessment & Plan:   Problem List Items Addressed This  Visit    Anemia    hgb has been stable.  Follow cbc.       Relevant Orders   CBC with Differential/Platelet (Completed)   Ferritin (Completed)   B12 deficiency    Continue B12 injections.       Diabetes mellitus    Sugars dong ok per her report.  No low blood sugars.  Follow metabolic panel and [id number].       Relevant Orders   Hemoglobin A1c (Completed)   Hypercholesteremia    Follow lipid profile.        Hypertension - Primary    Blood pressure is elevated today.  She is off hydralazine.  On amlodipine.  Spoke to Dr Marella Bile office.  Her refills have been sent in for hydralazine.  Pt notified.  Will restart today.  Follow metabolic panel.        Relevant Orders   Basic metabolic panel (Completed)   Hypothyroidism    On thyroid replacement.  Follow tsh.       Loss of weight    Check cbc, metabolic panel and thyroid test - to confirm no metabolic etiology.  Encouraged increased po intake.        Relevant Orders   TSH (Completed)   Hepatic function panel (Completed)   Neck pain    Neck pain as outlined.  Instructed to take tylenol as directed and scheduled.  Follow.        Peripheral vascular disease    Followed by vascular surgery.         I spent 25 minutes with the patient and more than 50% of the time was spent in consultation regarding the above.     Einar Pheasant, MD

## 2015-03-05 NOTE — Progress Notes (Signed)
Pre visit review using our clinic review tool, if applicable. No additional management support is needed unless otherwise documented below in the visit note. 

## 2015-03-06 LAB — HEPATIC FUNCTION PANEL
ALBUMIN: 3.4 g/dL — AB (ref 3.5–5.2)
ALK PHOS: 35 U/L — AB (ref 39–117)
ALT: 12 U/L (ref 0–35)
AST: 20 U/L (ref 0–37)
BILIRUBIN TOTAL: 0.3 mg/dL (ref 0.2–1.2)
Bilirubin, Direct: 0.1 mg/dL (ref 0.0–0.3)
Total Protein: 6.9 g/dL (ref 6.0–8.3)

## 2015-03-06 LAB — CBC WITH DIFFERENTIAL/PLATELET
BASOS ABS: 0 10*3/uL (ref 0.0–0.1)
Basophils Relative: 0 % (ref 0.0–3.0)
EOS PCT: 1.4 % (ref 0.0–5.0)
Eosinophils Absolute: 0.1 10*3/uL (ref 0.0–0.7)
HCT: 30.9 % — ABNORMAL LOW (ref 36.0–46.0)
HEMOGLOBIN: 10.3 g/dL — AB (ref 12.0–15.0)
LYMPHS ABS: 1.7 10*3/uL (ref 0.7–4.0)
Lymphocytes Relative: 27.6 % (ref 12.0–46.0)
MCHC: 33.4 g/dL (ref 30.0–36.0)
MCV: 90.8 fl (ref 78.0–100.0)
MONO ABS: 1.1 10*3/uL — AB (ref 0.1–1.0)
Monocytes Relative: 18.2 % — ABNORMAL HIGH (ref 3.0–12.0)
Neutro Abs: 3.3 10*3/uL (ref 1.4–7.7)
Neutrophils Relative %: 52.8 % (ref 43.0–77.0)
PLATELETS: 261 10*3/uL (ref 150.0–400.0)
RBC: 3.4 Mil/uL — ABNORMAL LOW (ref 3.87–5.11)
RDW: 16.3 % — ABNORMAL HIGH (ref 11.5–15.5)
WBC: 6.2 10*3/uL (ref 4.0–10.5)

## 2015-03-06 LAB — BASIC METABOLIC PANEL
BUN: 24 mg/dL — AB (ref 6–23)
CO2: 29 meq/L (ref 19–32)
Calcium: 9.6 mg/dL (ref 8.4–10.5)
Chloride: 106 mEq/L (ref 96–112)
Creatinine, Ser: 0.82 mg/dL (ref 0.40–1.20)
GFR: 83.3 mL/min (ref >60.00)
GLUCOSE: 77 mg/dL (ref 70–99)
Potassium: 4 mEq/L (ref 3.5–5.1)
SODIUM: 145 meq/L (ref 135–145)

## 2015-03-06 LAB — FERRITIN: Ferritin: 140.5 ng/mL (ref 10.0–291.0)

## 2015-03-06 LAB — HEMOGLOBIN A1C: HEMOGLOBIN A1C: 6.6 % — AB (ref 4.6–6.5)

## 2015-03-06 LAB — TSH: TSH: 1.13 u[IU]/mL (ref 0.35–4.50)

## 2015-03-07 ENCOUNTER — Encounter: Payer: Self-pay | Admitting: Internal Medicine

## 2015-03-07 DIAGNOSIS — M542 Cervicalgia: Secondary | ICD-10-CM | POA: Insufficient documentation

## 2015-03-07 NOTE — Assessment & Plan Note (Signed)
On thyroid replacement.  Follow tsh.  

## 2015-03-07 NOTE — Assessment & Plan Note (Signed)
Sugars dong ok per her report.  No low blood sugars.  Follow metabolic panel and [id number].

## 2015-03-07 NOTE — Assessment & Plan Note (Signed)
Follow lipid profile.

## 2015-03-07 NOTE — Assessment & Plan Note (Signed)
Neck pain as outlined.  Instructed to take tylenol as directed and scheduled.  Follow.

## 2015-03-07 NOTE — Assessment & Plan Note (Signed)
Blood pressure is elevated today.  She is off hydralazine.  On amlodipine.  Spoke to Dr Marella Bile office.  Her refills have been sent in for hydralazine.  Pt notified.  Will restart today.  Follow metabolic panel.

## 2015-03-07 NOTE — Assessment & Plan Note (Signed)
Check cbc, metabolic panel and thyroid test - to confirm no metabolic etiology.  Encouraged increased po intake.

## 2015-03-07 NOTE — Assessment & Plan Note (Signed)
hgb has been stable.  Follow cbc.  

## 2015-03-07 NOTE — Assessment & Plan Note (Signed)
Continue B12 injections.   

## 2015-03-07 NOTE — Assessment & Plan Note (Signed)
Followed by vascular surgery. 

## 2015-03-08 ENCOUNTER — Other Ambulatory Visit: Payer: Self-pay | Admitting: Internal Medicine

## 2015-03-08 DIAGNOSIS — D649 Anemia, unspecified: Secondary | ICD-10-CM

## 2015-03-08 NOTE — Progress Notes (Signed)
Orders placed for f/u labs.  

## 2015-03-15 ENCOUNTER — Other Ambulatory Visit: Payer: Self-pay

## 2015-03-15 ENCOUNTER — Telehealth: Payer: Self-pay | Admitting: *Deleted

## 2015-03-15 MED ORDER — INSULIN GLARGINE 100 UNIT/ML SOLOSTAR PEN: PEN_INJECTOR | SUBCUTANEOUS | Status: DC

## 2015-03-15 NOTE — Telephone Encounter (Signed)
Completed.

## 2015-03-15 NOTE — Telephone Encounter (Signed)
Patient needs a refill of her Sure comfort pen needles. Please advise MD

## 2015-03-16 NOTE — Telephone Encounter (Signed)
Tara [name] called again and hasn't received the RX for her Sure comfort pen needles yet. They are requesting to resend the Rx again. Thanks

## 2015-03-18 ENCOUNTER — Other Ambulatory Visit: Payer: Self-pay | Admitting: Internal Medicine

## 2015-03-22 ENCOUNTER — Other Ambulatory Visit (INDEPENDENT_AMBULATORY_CARE_PROVIDER_SITE_OTHER): Payer: Medicare Other

## 2015-03-22 DIAGNOSIS — E538 Deficiency of other specified B group vitamins: Secondary | ICD-10-CM | POA: Diagnosis not present

## 2015-03-22 DIAGNOSIS — D649 Anemia, unspecified: Secondary | ICD-10-CM

## 2015-03-22 LAB — IBC PANEL
Iron: 69 ug/dL (ref 42–145)
SATURATION RATIOS: 24.2 % (ref 20.0–50.0)
TRANSFERRIN: 204 mg/dL — AB (ref 212.0–360.0)

## 2015-03-22 LAB — VITAMIN B12: VITAMIN B 12: 399 pg/mL (ref 211–911)

## 2015-03-23 LAB — CBC WITH DIFFERENTIAL/PLATELET
BASOS PCT: 0 % (ref 0.0–3.0)
Basophils Absolute: 0 10*3/uL (ref 0.0–0.1)
EOS PCT: 5 % (ref 0.0–5.0)
Eosinophils Absolute: 0.2 10*3/uL (ref 0.0–0.7)
HEMATOCRIT: 28.2 % — AB (ref 36.0–46.0)
HEMOGLOBIN: 9.4 g/dL — AB (ref 12.0–15.0)
LYMPHS ABS: 2.2 10*3/uL (ref 0.7–4.0)
LYMPHS PCT: 44.4 % (ref 12.0–46.0)
MCHC: 33.5 g/dL (ref 30.0–36.0)
MCV: 91.2 fl (ref 78.0–100.0)
MONOS PCT: 11.7 % (ref 3.0–12.0)
Monocytes Absolute: 0.6 10*3/uL (ref 0.1–1.0)
NEUTROS ABS: 1.9 10*3/uL (ref 1.4–7.7)
Neutrophils Relative %: 38.9 % — ABNORMAL LOW (ref 43.0–77.0)
Platelets: 235 10*3/uL (ref 150.0–400.0)
RBC: 3.09 Mil/uL — ABNORMAL LOW (ref 3.87–5.11)
RDW: 17.6 % — ABNORMAL HIGH (ref 11.5–15.5)
WBC: 4.9 10*3/uL (ref 4.0–10.5)

## 2015-03-29 ENCOUNTER — Other Ambulatory Visit: Payer: Self-pay | Admitting: Internal Medicine

## 2015-04-05 ENCOUNTER — Telehealth: Payer: Self-pay | Admitting: *Deleted

## 2015-04-05 ENCOUNTER — Other Ambulatory Visit (INDEPENDENT_AMBULATORY_CARE_PROVIDER_SITE_OTHER): Payer: Medicare Other

## 2015-04-05 DIAGNOSIS — D649 Anemia, unspecified: Secondary | ICD-10-CM

## 2015-04-05 LAB — CBC WITH DIFFERENTIAL/PLATELET
BASOS PCT: 0.2 % (ref 0.0–3.0)
Basophils Absolute: 0 10*3/uL (ref 0.0–0.1)
EOS ABS: 0.3 10*3/uL (ref 0.0–0.7)
EOS PCT: 5.5 % — AB (ref 0.0–5.0)
HEMATOCRIT: 30.1 % — AB (ref 36.0–46.0)
HEMOGLOBIN: 9.8 g/dL — AB (ref 12.0–15.0)
LYMPHS PCT: 35.2 % (ref 12.0–46.0)
Lymphs Abs: 1.7 10*3/uL (ref 0.7–4.0)
MCHC: 32.6 g/dL (ref 30.0–36.0)
MCV: 92.8 fl (ref 78.0–100.0)
MONO ABS: 0.3 10*3/uL (ref 0.1–1.0)
Monocytes Relative: 7.1 % (ref 3.0–12.0)
Neutro Abs: 2.6 10*3/uL (ref 1.4–7.7)
Neutrophils Relative %: 52 % (ref 43.0–77.0)
Platelets: 183 10*3/uL (ref 150.0–400.0)
RBC: 3.25 Mil/uL — AB (ref 3.87–5.11)
RDW: 18.9 % — ABNORMAL HIGH (ref 11.5–15.5)
WBC: 4.9 10*3/uL (ref 4.0–10.5)

## 2015-04-05 NOTE — Telephone Encounter (Signed)
Labs and dx?  

## 2015-04-05 NOTE — Telephone Encounter (Signed)
Order placed for lab 

## 2015-04-06 ENCOUNTER — Encounter: Payer: Self-pay | Admitting: *Deleted

## 2015-04-09 ENCOUNTER — Other Ambulatory Visit: Payer: Self-pay | Admitting: Internal Medicine

## 2015-04-10 ENCOUNTER — Ambulatory Visit (INDEPENDENT_AMBULATORY_CARE_PROVIDER_SITE_OTHER): Payer: Medicare Other | Admitting: Internal Medicine

## 2015-04-10 ENCOUNTER — Encounter: Payer: Self-pay | Admitting: Internal Medicine

## 2015-04-10 VITALS — BP 110/60 | HR 67 | Temp 98.6°F | Ht <= 58 in | Wt 97.2 lb

## 2015-04-10 DIAGNOSIS — I739 Peripheral vascular disease, unspecified: Secondary | ICD-10-CM

## 2015-04-10 DIAGNOSIS — Z23 Encounter for immunization: Secondary | ICD-10-CM

## 2015-04-10 DIAGNOSIS — R634 Abnormal weight loss: Secondary | ICD-10-CM

## 2015-04-10 DIAGNOSIS — E538 Deficiency of other specified B group vitamins: Secondary | ICD-10-CM | POA: Diagnosis not present

## 2015-04-10 DIAGNOSIS — E7800 Pure hypercholesterolemia, unspecified: Secondary | ICD-10-CM

## 2015-04-10 DIAGNOSIS — E1159 Type 2 diabetes mellitus with other circulatory complications: Secondary | ICD-10-CM | POA: Diagnosis not present

## 2015-04-10 DIAGNOSIS — E039 Hypothyroidism, unspecified: Secondary | ICD-10-CM

## 2015-04-10 DIAGNOSIS — I10 Essential (primary) hypertension: Secondary | ICD-10-CM | POA: Diagnosis not present

## 2015-04-10 DIAGNOSIS — D649 Anemia, unspecified: Secondary | ICD-10-CM

## 2015-04-10 NOTE — Progress Notes (Signed)
Patient ID: Tara [name], female   DOB: May 16, 1918, 81 y.o.   MRN: [id number]   Subjective:    Patient ID: Tara [name], female    DOB: 1918-02-23, 81 y.o.   MRN: [id number]  HPI  Patient here for a scheduled follow up.  She is accompanied by her daughter.  History obtained from both of them.  She is eating.  No nausea or vomiting.  Bowels stable.  Here to f/u regarding her blood pressure.  No headache reported.  No dizziness.  No chest pain or tightness.  Breathing stable.  Overall feels things are stable.    Past Medical History  Diagnosis Date  . Asthma   . Diabetes mellitus   . Environmental allergies   . Hypertension    Past Surgical History  Procedure Laterality Date  . Appendectomy    . Tubal ligation     Family History  Problem Relation Age of Onset  . Hypertension Mother   . Cancer Brother     lung (question)  . Cancer Sister     questionable type   Social History   Social History  . Marital Status: Widowed    Spouse Name: N/A  . Number of Children: N/A  . Years of Education: N/A   Social History Main Topics  . Smoking status: Never Smoker   . Smokeless tobacco: Never Used  . Alcohol Use: No  . Drug Use: No  . Sexual Activity: Not Asked   Other Topics Concern  . None   Social History Narrative    Outpatient Encounter Prescriptions as of 04/10/2015  Medication Sig  . ACCU-CHEK AVIVA PLUS test strip CHECK SUGAR ONCE DAILY  . acetaminophen (TYLENOL) 325 MG tablet Take 650 mg by mouth every 6 (six) hours as needed.  Marland Kitchen amLODipine (NORVASC) 5 MG tablet Take 1 tablet (5 mg total) by mouth daily.  Marland Kitchen aspirin 81 MG tablet Take 81 mg by mouth daily.  . Calcium Carbonate-Vitamin D (CALTRATE 600+D PO) Take by mouth.  . Cholecalciferol (D3 SUPER STRENGTH) 2000 UNITS CAPS Take by mouth.  . fexofenadine (ALLEGRA) 180 MG tablet Take 180 mg by mouth daily.  . fluticasone (FLONASE) 50 MCG/ACT nasal spray Place 2 sprays into both nostrils daily.  . hydrALAZINE  (APRESOLINE) 50 MG tablet Take 50 mg by mouth 2 (two) times daily.  . Insulin Glargine (LANTUS SOLOSTAR) 100 UNIT/ML Solostar Pen INJECT 6 UNITS UNDER THE SKIN ONCE DAILY  . LANTUS 100 UNIT/ML injection INJECT 6 UNITS INTO THE SKIN DAILY  . metoprolol succinate (TOPROL-XL) 25 MG 24 hr tablet TAKE ONE (1) TABLET EACH DAY  . Multiple Vitamin (MULTIVITAMIN) tablet Take 1 tablet by mouth daily.  . pioglitazone (ACTOS) 15 MG tablet TAKE ONE (1) TABLET EACH DAY  . SURE COMFORT PEN NEEDLES 31G X 8 MM MISC AS DIRECTED  . SYNTHROID 50 MCG tablet TAKE ONE (1) TABLET EACH DAY  . [DISCONTINUED] cyclobenzaprine (FLEXERIL) 5 MG tablet Take 1 tablet (5 mg total) by mouth 3 times/day as needed-between meals & bedtime for muscle spasms.  . [DISCONTINUED] meloxicam (MOBIC) 15 MG tablet Take 1 tablet (15 mg total) by mouth daily.   No facility-administered encounter medications on file as of 04/10/2015.    Review of Systems  Constitutional: Negative for appetite change.       Weight stable from last check.   HENT: Negative for congestion and sinus pressure.   Eyes: Negative for pain and discharge.  Respiratory: Negative for cough, chest  tightness and shortness of breath.   Cardiovascular: Negative for chest pain, palpitations and leg swelling.  Gastrointestinal: Negative for nausea, vomiting, abdominal pain and diarrhea.  Genitourinary: Negative for dysuria and difficulty urinating.  Skin: Negative for color change and rash.  Neurological: Negative for dizziness, light-headedness and headaches.  Psychiatric/Behavioral: Negative for dysphoric mood and agitation.       Objective:     Blood pressure rechecked by me:  144/68  Physical Exam  Constitutional: She appears well-developed and well-nourished. No distress.  HENT:  Nose: Nose normal.  Mouth/Throat: Oropharynx is clear and moist.  Eyes: Conjunctivae are normal. Right eye exhibits no discharge. Left eye exhibits no discharge.  Neck: Neck supple.  No thyromegaly present.  Cardiovascular: Normal rate and regular rhythm.   Pulmonary/Chest: Breath sounds normal. No respiratory distress. She has no wheezes.  Abdominal: Soft. Bowel sounds are normal. There is no tenderness.  Musculoskeletal: She exhibits no edema or tenderness.  Lymphadenopathy:    She has no cervical adenopathy.  Skin: No rash noted. No erythema.  Psychiatric: She has a normal mood and affect. Her behavior is normal.    BP 110/60 mmHg  Pulse 67  Temp(Src) 98.6 F (37 C) (Oral)  Ht _0  (1.473 m)  Wt 97 lb 4 oz (44.112 kg)  BMI 20.33 kg/m2  SpO2 96% Wt Readings from Last 3 Encounters:  04/10/15 97 lb 4 oz (44.112 kg)  03/05/15 97 lb 3.2 oz (44.09 kg)  02/02/15 99 lb 4 oz (45.02 kg)     Lab Results  Component Value Date   WBC 4.9 04/05/2015   HGB 9.8* 04/05/2015   HCT 30.1* 04/05/2015   PLT 183.0 04/05/2015   GLUCOSE 77 03/05/2015   CHOL 209* 06/21/2014   TRIG 105.0 06/21/2014   HDL 77.00 06/21/2014   LDLDIRECT 107.1 07/29/2013   LDLCALC 111* 06/21/2014   ALT 12 03/05/2015   AST 20 03/05/2015   NA 145 03/05/2015   K 4.0 03/05/2015   CL 106 03/05/2015   CREATININE 0.82 03/05/2015   BUN 24* 03/05/2015   CO2 29 03/05/2015   TSH 1.13 03/05/2015   INR 1.0 01/25/2013   HGBA1C 6.6* 03/05/2015   MICROALBUR 115.0 Repeated and verified X2.* 07/29/2013    Dg Cervical Spine Complete  02/02/2015   CLINICAL DATA:  Acute neck pain without reported injury.  EXAM: CERVICAL SPINE  4+ VIEWS  COMPARISON:  None.  FINDINGS: Only the first 6 cervical vertebra are completely visualized on this study. Swimmer's projection could not be obtained and therefore C7 is incompletely visualized. Grade 1 anterolisthesis of C4-5 is noted most likely due to posterior facet joint degenerative change. Degenerative disc disease is also noted at C3-4, C5-6 C6-7 with anterior osteophyte formation.  IMPRESSION: Grade 1 anterolisthesis of C4-5 is noted most likely due to posterior facet  joint hypertrophy. No definite fracture is noted, although C7 is incompletely visualized as a swimmer's projection could not be obtained. If there is concern for traumatic injury, CT scan of the cervical spine would be recommended for further evaluation.   Electronically Signed   By: Marijo Conception, M.D.   On: 02/02/2015 12:45       Assessment & Plan:   Problem List Items Addressed This Visit    Anemia    hgb improved slightly recently.  On multivitamin with iron.  Follow cbc and ferritin.       B12 deficiency    Continue B12 injections.  Diabetes mellitus    Sugars are under good control per her report.  No low blood sugars.  Follow met b and a1c.   Lab Results  Component Value Date   HGBA1C 6.6* 03/05/2015        Hypercholesteremia    Follow lipid panel.        Hypertension    Back on hydralazine.  Taking amlodipine.  Pressures better.  Follow.        Hypothyroidism    On thyroid replacement.  Follow tsh.       Loss of weight    Weight has stabilized from the last check.  Follow.       Peripheral vascular disease    Followed by vascular surgery.         Other Visit Diagnoses    Encounter for immunization    -  Primary        Einar Pheasant, MD

## 2015-04-10 NOTE — Progress Notes (Signed)
Pre-visit discussion using our clinic review tool. No additional management support is needed unless otherwise documented below in the visit note.  

## 2015-04-16 ENCOUNTER — Encounter: Payer: Self-pay | Admitting: Internal Medicine

## 2015-04-16 NOTE — Assessment & Plan Note (Signed)
Followed by vascular surgery. 

## 2015-04-16 NOTE — Assessment & Plan Note (Signed)
hgb improved slightly recently.  On multivitamin with iron.  Follow cbc and ferritin.

## 2015-04-16 NOTE — Assessment & Plan Note (Signed)
Continue B12 injections.   

## 2015-04-16 NOTE — Assessment & Plan Note (Signed)
Sugars are under good control per her report.  No low blood sugars.  Follow met b and a1c.   Lab Results  Component Value Date   HGBA1C 6.6* 03/05/2015

## 2015-04-16 NOTE — Assessment & Plan Note (Signed)
Back on hydralazine.  Taking amlodipine.  Pressures better.  Follow.

## 2015-04-16 NOTE — Assessment & Plan Note (Signed)
On thyroid replacement.  Follow tsh.  

## 2015-04-16 NOTE — Assessment & Plan Note (Signed)
Follow lipid panel.   

## 2015-04-16 NOTE — Assessment & Plan Note (Signed)
Weight has stabilized from the last check.  Follow.

## 2015-05-14 ENCOUNTER — Other Ambulatory Visit: Payer: Self-pay | Admitting: Internal Medicine

## 2015-05-15 ENCOUNTER — Other Ambulatory Visit: Payer: Self-pay | Admitting: *Deleted

## 2015-05-15 MED ORDER — INSULIN PEN NEEDLE 31G X 8 MM MISC: Status: DC

## 2015-05-23 ENCOUNTER — Ambulatory Visit (INDEPENDENT_AMBULATORY_CARE_PROVIDER_SITE_OTHER): Payer: Medicare Other | Admitting: Internal Medicine

## 2015-05-23 ENCOUNTER — Encounter: Payer: Self-pay | Admitting: Internal Medicine

## 2015-05-23 VITALS — BP 126/50 | HR 66 | Temp 98.3°F | Resp 17 | Ht <= 58 in | Wt 101.2 lb

## 2015-05-23 DIAGNOSIS — M545 Low back pain: Secondary | ICD-10-CM

## 2015-05-23 DIAGNOSIS — E7800 Pure hypercholesterolemia, unspecified: Secondary | ICD-10-CM

## 2015-05-23 DIAGNOSIS — E538 Deficiency of other specified B group vitamins: Secondary | ICD-10-CM

## 2015-05-23 DIAGNOSIS — R2681 Unsteadiness on feet: Secondary | ICD-10-CM

## 2015-05-23 DIAGNOSIS — I10 Essential (primary) hypertension: Secondary | ICD-10-CM | POA: Diagnosis not present

## 2015-05-23 DIAGNOSIS — R350 Frequency of micturition: Secondary | ICD-10-CM

## 2015-05-23 DIAGNOSIS — M542 Cervicalgia: Secondary | ICD-10-CM

## 2015-05-23 DIAGNOSIS — I739 Peripheral vascular disease, unspecified: Secondary | ICD-10-CM | POA: Diagnosis not present

## 2015-05-23 DIAGNOSIS — Z794 Long term (current) use of insulin: Secondary | ICD-10-CM

## 2015-05-23 DIAGNOSIS — E039 Hypothyroidism, unspecified: Secondary | ICD-10-CM

## 2015-05-23 DIAGNOSIS — D649 Anemia, unspecified: Secondary | ICD-10-CM | POA: Diagnosis not present

## 2015-05-23 DIAGNOSIS — E1159 Type 2 diabetes mellitus with other circulatory complications: Secondary | ICD-10-CM

## 2015-05-23 DIAGNOSIS — R634 Abnormal weight loss: Secondary | ICD-10-CM

## 2015-05-23 LAB — CBC WITH DIFFERENTIAL/PLATELET
BASOS ABS: 0 10*3/uL (ref 0.0–0.1)
Basophils Relative: 0 % (ref 0.0–3.0)
Eosinophils Absolute: 0.1 10*3/uL (ref 0.0–0.7)
Eosinophils Relative: 2.4 % (ref 0.0–5.0)
HCT: 30.5 % — ABNORMAL LOW (ref 36.0–46.0)
Hemoglobin: 10 g/dL — ABNORMAL LOW (ref 12.0–15.0)
LYMPHS ABS: 1.9 10*3/uL (ref 0.7–4.0)
LYMPHS PCT: 33 % (ref 12.0–46.0)
MCHC: 32.6 g/dL (ref 30.0–36.0)
MCV: 94.3 fl (ref 78.0–100.0)
MONOS PCT: 9.2 % (ref 3.0–12.0)
Monocytes Absolute: 0.5 10*3/uL (ref 0.1–1.0)
Neutro Abs: 3.1 10*3/uL (ref 1.4–7.7)
Neutrophils Relative %: 55.4 % (ref 43.0–77.0)
Platelets: 183 10*3/uL (ref 150.0–400.0)
RBC: 3.24 Mil/uL — AB (ref 3.87–5.11)
RDW: 16.8 % — ABNORMAL HIGH (ref 11.5–15.5)
WBC: 5.6 10*3/uL (ref 4.0–10.5)

## 2015-05-23 LAB — HEPATIC FUNCTION PANEL
ALT: 13 U/L (ref 0–35)
AST: 24 U/L (ref 0–37)
Albumin: 3.4 g/dL — ABNORMAL LOW (ref 3.5–5.2)
Alkaline Phosphatase: 31 U/L — ABNORMAL LOW (ref 39–117)
BILIRUBIN DIRECT: 0 mg/dL (ref 0.0–0.3)
BILIRUBIN TOTAL: 0.2 mg/dL (ref 0.2–1.2)
Total Protein: 7 g/dL (ref 6.0–8.3)

## 2015-05-23 LAB — BASIC METABOLIC PANEL
BUN: 27 mg/dL — ABNORMAL HIGH (ref 6–23)
CHLORIDE: 107 meq/L (ref 96–112)
CO2: 30 meq/L (ref 19–32)
CREATININE: 1.01 mg/dL (ref 0.40–1.20)
Calcium: 9.6 mg/dL (ref 8.4–10.5)
GFR: 65.46 mL/min (ref >60.00)
Glucose, Bld: 80 mg/dL (ref 70–99)
Potassium: 4 mEq/L (ref 3.5–5.1)
Sodium: 142 mEq/L (ref 135–145)

## 2015-05-23 LAB — FERRITIN: Ferritin: 90.1 ng/mL (ref 10.0–291.0)

## 2015-05-23 NOTE — Progress Notes (Signed)
Pre-visit discussion using our clinic review tool. No additional management support is needed unless otherwise documented below in the visit note.  

## 2015-05-23 NOTE — Progress Notes (Signed)
Patient ID: Tara [name], female   DOB: 1917-10-31, 81 y.o.   MRN: [id number]   Subjective:    Patient ID: Tara [name], female    DOB: January 29, 1918, 81 y.o.   MRN: [id number]  HPI  Patient with past history of diabetes, hypertension, hypercholesterolemia and PVD who comes in today to follow up on these issues.  She is accompanied by her daughter.  History obtained from both of them.  She is eating well.  Weight is up some.  No acid reflux.  No nausea or vomiting.  No bowel change.  No abdominal pain or cramping.  No low sugars.  Brought in no recorded readings.  Some low back and leg discomfort.  Has seen Dr Sharlet Salina and vascular surgery.  Stable.     Past Medical History  Diagnosis Date  . Asthma   . Diabetes mellitus ([place])   . Environmental allergies   . Hypertension    Past Surgical History  Procedure Laterality Date  . Appendectomy    . Tubal ligation     Family History  Problem Relation Age of Onset  . Hypertension Mother   . Cancer Brother     lung (question)  . Cancer Sister     questionable type   Social History   Social History  . Marital Status: Widowed    Spouse Name: N/A  . Number of Children: N/A  . Years of Education: N/A   Social History Main Topics  . Smoking status: Never Smoker   . Smokeless tobacco: Never Used  . Alcohol Use: No  . Drug Use: No  . Sexual Activity: Not Asked   Other Topics Concern  . None   Social History Narrative    Outpatient Encounter Prescriptions as of 05/23/2015  Medication Sig  . ACCU-CHEK AVIVA PLUS test strip CHECK SUGAR ONCE DAILY  . acetaminophen (TYLENOL) 325 MG tablet Take 650 mg by mouth every 6 (six) hours as needed.  Marland Kitchen amLODipine (NORVASC) 5 MG tablet Take 1 tablet (5 mg total) by mouth daily.  Marland Kitchen aspirin 81 MG tablet Take 81 mg by mouth daily.  . Calcium Carbonate-Vitamin D (CALTRATE 600+D PO) Take by mouth.  . Cholecalciferol (D3 SUPER STRENGTH) 2000 UNITS CAPS Take by mouth.  . fexofenadine (ALLEGRA) 180 MG  tablet Take 180 mg by mouth daily.  . fluticasone (FLONASE) 50 MCG/ACT nasal spray Place 2 sprays into both nostrils daily.  . hydrALAZINE (APRESOLINE) 50 MG tablet Take 50 mg by mouth 2 (two) times daily.  . Insulin Glargine (LANTUS SOLOSTAR) 100 UNIT/ML Solostar Pen INJECT 6 UNITS UNDER THE SKIN ONCE DAILY  . Insulin Pen Needle (SURE COMFORT PEN NEEDLES) 31G X 8 MM MISC AS DIRECTED  . LANTUS 100 UNIT/ML injection INJECT 6 UNITS INTO THE SKIN DAILY  . metoprolol succinate (TOPROL-XL) 25 MG 24 hr tablet TAKE ONE (1) TABLET BY MOUTH EVERY DAY  . Multiple Vitamin (MULTIVITAMIN) tablet Take 1 tablet by mouth daily.  . pioglitazone (ACTOS) 15 MG tablet TAKE ONE (1) TABLET EACH DAY  . SYNTHROID 50 MCG tablet TAKE ONE (1) TABLET EACH DAY   No facility-administered encounter medications on file as of 05/23/2015.    Review of Systems  Constitutional: Negative for appetite change.       Weight is up several pounds from the last check.   HENT: Negative for congestion and sinus pressure.   Eyes: Negative for discharge and visual disturbance.  Respiratory: Negative for cough, chest tightness and shortness of  breath.   Cardiovascular: Negative for chest pain and palpitations.  Gastrointestinal: Negative for nausea, vomiting, abdominal pain and diarrhea.  Genitourinary: Negative for dysuria and difficulty urinating.  Musculoskeletal: Positive for back pain.       Leg pain as outlined.   Skin: Negative for color change and rash.  Neurological: Negative for dizziness, light-headedness and headaches.  Psychiatric/Behavioral: Negative for dysphoric mood and agitation.       Objective:    Physical Exam  Constitutional: She appears well-developed and well-nourished. No distress.  HENT:  Nose: Nose normal.  Mouth/Throat: Oropharynx is clear and moist.  Eyes: Conjunctivae are normal. Right eye exhibits no discharge. Left eye exhibits no discharge.  Neck: Neck supple. No thyromegaly present.    Cardiovascular: Normal rate and regular rhythm.   Pulmonary/Chest: Breath sounds normal. No respiratory distress. She has no wheezes.  Abdominal: Soft. Bowel sounds are normal. There is no tenderness.  Musculoskeletal: She exhibits no edema or tenderness.  Lymphadenopathy:    She has no cervical adenopathy.  Skin: No rash noted. No erythema.  Psychiatric: She has a normal mood and affect. Her behavior is normal.    BP 126/50 mmHg  Pulse 66  Temp(Src) 98.3 F (36.8 C) (Oral)  Resp 17  Ht 4' 10"  (1.473 m)  Wt 101 lb 4 oz (45.927 kg)  BMI 21.17 kg/m2  SpO2 97% Wt Readings from Last 3 Encounters:  05/23/15 101 lb 4 oz (45.927 kg)  04/10/15 97 lb 4 oz (44.112 kg)  03/05/15 97 lb 3.2 oz (44.09 kg)     Lab Results  Component Value Date   WBC 5.6 05/23/2015   HGB 10.0* 05/23/2015   HCT 30.5* 05/23/2015   PLT 183.0 05/23/2015   GLUCOSE 80 05/23/2015   CHOL 209* 06/21/2014   TRIG 105.0 06/21/2014   HDL 77.00 06/21/2014   LDLDIRECT 107.1 07/29/2013   LDLCALC 111* 06/21/2014   ALT 13 05/23/2015   AST 24 05/23/2015   NA 142 05/23/2015   K 4.0 05/23/2015   CL 107 05/23/2015   CREATININE 1.01 05/23/2015   BUN 27* 05/23/2015   CO2 30 05/23/2015   TSH 1.13 03/05/2015   INR 1.0 01/25/2013   HGBA1C 6.6* 03/05/2015   MICROALBUR 115.0 Repeated and verified X2.* 07/29/2013    Dg Cervical Spine Complete  02/02/2015  CLINICAL DATA:  Acute neck pain without reported injury. EXAM: CERVICAL SPINE  4+ VIEWS COMPARISON:  None. FINDINGS: Only the first 6 cervical vertebra are completely visualized on this study. Swimmer's projection could not be obtained and therefore C7 is incompletely visualized. Grade 1 anterolisthesis of C4-5 is noted most likely due to posterior facet joint degenerative change. Degenerative disc disease is also noted at C3-4, C5-6 C6-7 with anterior osteophyte formation. IMPRESSION: Grade 1 anterolisthesis of C4-5 is noted most likely due to posterior facet joint  hypertrophy. No definite fracture is noted, although C7 is incompletely visualized as a swimmer's projection could not be obtained. If there is concern for traumatic injury, CT scan of the cervical spine would be recommended for further evaluation. Electronically Signed   By: Marijo Conception, M.D.   On: 02/02/2015 12:45       Assessment & Plan:   Problem List Items Addressed This Visit    Anemia - Primary    Follow cbc and ferritin.        Relevant Orders   CBC with Differential/Platelet (Completed)   Ferritin (Completed)   B12 deficiency    Continue B12  injections.        Back pain    Some low back pain and pain down leg intermittently.  Has seen Dr Sharlet Salina.  S/p injection.  Helped.  Follow.  Stable.        Diabetes mellitus ([place])    Sugars have been under reasonable control.  No low sugars.  Brought in no recorded sugar readings.  Follow met b and a1c.   Lab Results  Component Value Date   HGBA1C 6.6* 03/05/2015        Hypercholesteremia    Low cholesterol diet and exercise.  Follow lipid panel.       Hypertension    Blood pressure under reasonable control.  On amlodipine and hydralazine.  Pressures better.  Follow.  Follow metabolic panel.        Relevant Orders   Hepatic function panel (Completed)   Basic metabolic panel (Completed)   Hypothyroidism    On thyroid replacement.  Follow tsh.        Loss of weight    States eating well.  Weight up several pounds from last check.  Follow.        Neck pain    No significant pain reported today.  Follow.        Peripheral vascular disease ([place])    Followed by vascular surgery.        Unsteady gait    Instructed to use her walker.  Stable.  Follow.        Urinary frequency   Relevant Orders   Urinalysis, Routine w reflex microscopic (not at [facility])   CULTURE, URINE COMPREHENSIVE (Completed)       Einar Pheasant, MD

## 2015-05-24 ENCOUNTER — Encounter: Payer: Self-pay | Admitting: *Deleted

## 2015-05-25 LAB — CULTURE, URINE COMPREHENSIVE
Colony Count: NO GROWTH
Organism ID, Bacteria: NO GROWTH

## 2015-05-27 ENCOUNTER — Encounter: Payer: Self-pay | Admitting: Internal Medicine

## 2015-05-27 NOTE — Assessment & Plan Note (Signed)
Follow cbc and ferritin.  

## 2015-05-27 NOTE — Assessment & Plan Note (Signed)
No significant pain reported today.  Follow.

## 2015-05-27 NOTE — Assessment & Plan Note (Signed)
Low cholesterol diet and exercise.  Follow lipid panel.   

## 2015-05-27 NOTE — Assessment & Plan Note (Signed)
States eating well.  Weight up several pounds from last check.  Follow.

## 2015-05-27 NOTE — Assessment & Plan Note (Signed)
Instructed to use her walker.  Stable.  Follow.

## 2015-05-27 NOTE — Assessment & Plan Note (Signed)
On thyroid replacement.  Follow tsh.  

## 2015-05-27 NOTE — Assessment & Plan Note (Signed)
Blood pressure under reasonable control.  On amlodipine and hydralazine.  Pressures better.  Follow.  Follow metabolic panel.

## 2015-05-27 NOTE — Assessment & Plan Note (Signed)
Followed by vascular surgery. 

## 2015-05-27 NOTE — Assessment & Plan Note (Signed)
Some low back pain and pain down leg intermittently.  Has seen Dr Sharlet Salina.  S/p injection.  Helped.  Follow.  Stable.

## 2015-05-27 NOTE — Assessment & Plan Note (Signed)
Sugars have been under reasonable control.  No low sugars.  Brought in no recorded sugar readings.  Follow met b and a1c.   Lab Results  Component Value Date   HGBA1C 6.6* 03/05/2015

## 2015-05-27 NOTE — Assessment & Plan Note (Signed)
Continue B12 injections.   

## 2015-05-30 ENCOUNTER — Other Ambulatory Visit: Payer: Self-pay | Admitting: Internal Medicine

## 2015-06-06 ENCOUNTER — Encounter: Payer: Self-pay | Admitting: Internal Medicine

## 2015-06-11 ENCOUNTER — Ambulatory Visit: Payer: Self-pay | Admitting: Internal Medicine

## 2015-06-12 ENCOUNTER — Other Ambulatory Visit: Payer: Self-pay | Admitting: Internal Medicine

## 2015-07-02 ENCOUNTER — Other Ambulatory Visit: Payer: Self-pay | Admitting: Internal Medicine

## 2015-08-21 ENCOUNTER — Ambulatory Visit: Payer: Self-pay | Admitting: Internal Medicine

## 2015-09-03 ENCOUNTER — Other Ambulatory Visit: Payer: Self-pay | Admitting: Internal Medicine

## 2015-09-24 ENCOUNTER — Encounter: Payer: Self-pay | Admitting: Internal Medicine

## 2015-09-24 ENCOUNTER — Ambulatory Visit (INDEPENDENT_AMBULATORY_CARE_PROVIDER_SITE_OTHER): Payer: Medicare Other | Admitting: Internal Medicine

## 2015-09-24 VITALS — BP 128/70 | HR 73 | Temp 98.2°F | Resp 18 | Ht <= 58 in | Wt 104.0 lb

## 2015-09-24 DIAGNOSIS — E538 Deficiency of other specified B group vitamins: Secondary | ICD-10-CM

## 2015-09-24 DIAGNOSIS — I10 Essential (primary) hypertension: Secondary | ICD-10-CM

## 2015-09-24 DIAGNOSIS — E039 Hypothyroidism, unspecified: Secondary | ICD-10-CM | POA: Diagnosis not present

## 2015-09-24 DIAGNOSIS — I739 Peripheral vascular disease, unspecified: Secondary | ICD-10-CM

## 2015-09-24 DIAGNOSIS — E1151 Type 2 diabetes mellitus with diabetic peripheral angiopathy without gangrene: Secondary | ICD-10-CM | POA: Diagnosis not present

## 2015-09-24 DIAGNOSIS — D649 Anemia, unspecified: Secondary | ICD-10-CM | POA: Diagnosis not present

## 2015-09-24 DIAGNOSIS — E7800 Pure hypercholesterolemia, unspecified: Secondary | ICD-10-CM | POA: Diagnosis not present

## 2015-09-24 LAB — HEPATIC FUNCTION PANEL
ALBUMIN: 3.6 g/dL (ref 3.5–5.2)
ALK PHOS: 33 U/L — AB (ref 39–117)
ALT: 12 U/L (ref 0–35)
AST: 25 U/L (ref 0–37)
Bilirubin, Direct: 0.1 mg/dL (ref 0.0–0.3)
TOTAL PROTEIN: 7 g/dL (ref 6.0–8.3)
Total Bilirubin: 0.3 mg/dL (ref 0.2–1.2)

## 2015-09-24 LAB — BASIC METABOLIC PANEL
BUN: 26 mg/dL — AB (ref 6–23)
CALCIUM: 9.3 mg/dL (ref 8.4–10.5)
CO2: 30 mEq/L (ref 19–32)
CREATININE: 0.8 mg/dL (ref 0.40–1.20)
Chloride: 105 mEq/L (ref 96–112)
GFR: 85.6 mL/min (ref >60.00)
Glucose, Bld: 121 mg/dL — ABNORMAL HIGH (ref 70–99)
Potassium: 3.9 mEq/L (ref 3.5–5.1)
Sodium: 143 mEq/L (ref 135–145)

## 2015-09-24 LAB — CBC WITH DIFFERENTIAL/PLATELET
HCT: 34.9 % — ABNORMAL LOW (ref 36.0–46.0)
Hemoglobin: 11.1 g/dL — ABNORMAL LOW (ref 12.0–15.0)
MCHC: 31.8 g/dL (ref 30.0–36.0)
MCV: 94.1 fl (ref 78.0–100.0)
Platelets: 220 10*3/uL (ref 150.0–400.0)
RBC: 3.71 Mil/uL — ABNORMAL LOW (ref 3.87–5.11)
RDW: 16.8 % — ABNORMAL HIGH (ref 11.5–15.5)
WBC: 6 10*3/uL (ref 4.0–10.5)

## 2015-09-24 LAB — FERRITIN: FERRITIN: 82 ng/mL (ref 10.0–291.0)

## 2015-09-24 LAB — HEMOGLOBIN A1C: HEMOGLOBIN A1C: 6.3 % (ref 4.6–6.5)

## 2015-09-24 MED ORDER — INSULIN GLARGINE 100 UNIT/ML ~~LOC~~ SOLN: SUBCUTANEOUS | Status: DC

## 2015-09-24 MED ORDER — GLUCOSE BLOOD VI STRP: ORAL_STRIP | Status: DC

## 2015-09-24 MED ORDER — METOPROLOL SUCCINATE ER 25 MG PO TB24: ORAL_TABLET | ORAL | Status: DC

## 2015-09-24 MED ORDER — INSULIN PEN NEEDLE 31G X 8 MM MISC: Status: DC

## 2015-09-24 MED ORDER — SYNTHROID 50 MCG PO TABS: ORAL_TABLET | ORAL | Status: DC

## 2015-09-24 NOTE — Progress Notes (Signed)
Pre-visit discussion using our clinic review tool. No additional management support is needed unless otherwise documented below in the visit note.  

## 2015-09-24 NOTE — Progress Notes (Signed)
Patient ID: Tara [name], female   DOB: [birth year], 82 y.o.   MRN: [id number]   Subjective:    Patient ID: Tara [name], female    DOB: Aug 05, 1918, 82 y.o.   MRN: [id number]  HPI  Patient with past history of PVD, diabetes, hypertension, hypercholesterolemia and hypothyroidism.  She comes in today to follow up on these issues.  She is accompanied by her daughter.  History obtained from both of them.  Her weight is up some.  States she is eating well.  No nausea or vomiting.  No bowel change.  No chest pain or tightness.  Breathing stable.  Bowels stable.  Able to get around her house without difficulty.     Past Medical History  Diagnosis Date  . Asthma   . Diabetes mellitus ([place])   . Environmental allergies   . Hypertension    Past Surgical History  Procedure Laterality Date  . Appendectomy    . Tubal ligation     Family History  Problem Relation Age of Onset  . Hypertension Mother   . Cancer Brother     lung (question)  . Cancer Sister     questionable type   Social History   Social History  . Marital Status: Widowed    Spouse Name: N/A  . Number of Children: N/A  . Years of Education: N/A   Social History Main Topics  . Smoking status: Never Smoker   . Smokeless tobacco: Never Used  . Alcohol Use: No  . Drug Use: No  . Sexual Activity: Not Asked   Other Topics Concern  . None   Social History Narrative    Outpatient Encounter Prescriptions as of 09/24/2015  Medication Sig  . acetaminophen (TYLENOL) 325 MG tablet Take 650 mg by mouth every 6 (six) hours as needed.  Marland Kitchen amLODipine (NORVASC) 5 MG tablet TAKE ONE TABLET TWICE DAILY  . aspirin 81 MG tablet Take 81 mg by mouth daily.  . Calcium Carbonate-Vitamin D (CALTRATE 600+D PO) Take by mouth.  . Cholecalciferol (D3 SUPER STRENGTH) 2000 UNITS CAPS Take by mouth.  . fexofenadine (ALLEGRA) 180 MG tablet Take 180 mg by mouth daily.  . fluticasone (FLONASE) 50 MCG/ACT nasal spray Place 2 sprays into both nostrils  daily.  Marland Kitchen glucose blood (ACCU-CHEK AVIVA PLUS) test strip CHECK SUGAR ONCE DAILY  . hydrALAZINE (APRESOLINE) 50 MG tablet Take 50 mg by mouth 2 (two) times daily.  . insulin glargine (LANTUS) 100 UNIT/ML injection INJECT 6 UNITS INTO THE SKIN DAILY  . Insulin Pen Needle (SURE COMFORT PEN NEEDLES) 31G X 8 MM MISC AS DIRECTED  . metoprolol succinate (TOPROL-XL) 25 MG 24 hr tablet TAKE ONE (1) TABLET BY MOUTH EVERY DAY  . Multiple Vitamin (MULTIVITAMIN) tablet Take 1 tablet by mouth daily.  . pioglitazone (ACTOS) 15 MG tablet TAKE ONE (1) TABLET BY MOUTH EVERY DAY  . SYNTHROID 50 MCG tablet TAKE ONE (1) TABLET EACH DAY  . [DISCONTINUED] ACCU-CHEK AVIVA PLUS test strip CHECK SUGAR ONCE DAILY  . [DISCONTINUED] Insulin Glargine (LANTUS SOLOSTAR) 100 UNIT/ML Solostar Pen INJECT 6 UNITS UNDER THE SKIN ONCE DAILY  . [DISCONTINUED] Insulin Pen Needle (SURE COMFORT PEN NEEDLES) 31G X 8 MM MISC AS DIRECTED  . [DISCONTINUED] LANTUS 100 UNIT/ML injection INJECT 6 UNITS INTO THE SKIN DAILY  . [DISCONTINUED] metoprolol succinate (TOPROL-XL) 25 MG 24 hr tablet TAKE ONE (1) TABLET BY MOUTH EVERY DAY  . [DISCONTINUED] SYNTHROID 50 MCG tablet TAKE ONE (1) TABLET EACH DAY  No facility-administered encounter medications on file as of 09/24/2015.    Review of Systems  Constitutional: Negative for appetite change.       Has gained some weight.  Eating well.   HENT: Negative for congestion and sinus pressure.   Respiratory: Negative for cough, chest tightness and shortness of breath.   Cardiovascular: Negative for chest pain, palpitations and leg swelling.  Gastrointestinal: Negative for nausea, vomiting, abdominal pain and diarrhea.  Genitourinary: Negative for dysuria and difficulty urinating.  Musculoskeletal: Negative for myalgias and joint swelling.  Skin: Negative for color change and rash.  Neurological: Negative for dizziness, light-headedness and headaches.  Psychiatric/Behavioral: Negative for  dysphoric mood and agitation.       Objective:    Physical Exam  Constitutional: She appears well-developed and well-nourished. No distress.  HENT:  Nose: Nose normal.  Mouth/Throat: Oropharynx is clear and moist.  Eyes: Conjunctivae are normal. Right eye exhibits no discharge. Left eye exhibits no discharge.  Neck: Neck supple. No thyromegaly present.  Cardiovascular: Normal rate and regular rhythm.   Pulmonary/Chest: Breath sounds normal. No respiratory distress. She has no wheezes.  Abdominal: Soft. Bowel sounds are normal. There is no tenderness.  Musculoskeletal: She exhibits no edema or tenderness.  Lymphadenopathy:    She has no cervical adenopathy.  Skin: No rash noted. No erythema.  Psychiatric: She has a normal mood and affect. Her behavior is normal.    BP 128/70 mmHg  Pulse 73  Temp(Src) 98.2 F (36.8 C) (Oral)  Resp 18  Ht 4\' 10"  (1.473 m)  Wt 104 lb (47.174 kg)  BMI 21.74 kg/m2  SpO2 97% Wt Readings from Last 3 Encounters:  09/24/15 104 lb (47.174 kg)  05/23/15 101 lb 4 oz (45.927 kg)  04/10/15 97 lb 4 oz (44.112 kg)     Lab Results  Component Value Date   WBC 6.0 09/24/2015   HGB 11.1* 09/24/2015   HCT 34.9* 09/24/2015   PLT 220.0 09/24/2015   GLUCOSE 121* 09/24/2015   CHOL 209* 06/21/2014   TRIG 105.0 06/21/2014   HDL 77.00 06/21/2014   LDLDIRECT 107.1 07/29/2013   LDLCALC 111* 06/21/2014   ALT 12 09/24/2015   AST 25 09/24/2015   NA 143 09/24/2015   K 3.9 09/24/2015   CL 105 09/24/2015   CREATININE 0.80 09/24/2015   BUN 26* 09/24/2015   CO2 30 09/24/2015   TSH 1.13 03/05/2015   INR 1.0 01/25/2013   HGBA1C 6.3 09/24/2015   MICROALBUR 115.0 Repeated and verified X2.* 07/29/2013    Dg Cervical Spine Complete  02/02/2015  CLINICAL DATA:  Acute neck pain without reported injury. EXAM: CERVICAL SPINE  4+ VIEWS COMPARISON:  None. FINDINGS: Only the first 6 cervical vertebra are completely visualized on this study. Swimmer's projection could  not be obtained and therefore C7 is incompletely visualized. Grade 1 anterolisthesis of C4-5 is noted most likely due to posterior facet joint degenerative change. Degenerative disc disease is also noted at C3-4, C5-6 C6-7 with anterior osteophyte formation. IMPRESSION: Grade 1 anterolisthesis of C4-5 is noted most likely due to posterior facet joint hypertrophy. No definite fracture is noted, although C7 is incompletely visualized as a swimmer's projection could not be obtained. If there is concern for traumatic injury, CT scan of the cervical spine would be recommended for further evaluation. Electronically Signed   By: Marijo Conception, M.D.   On: 02/02/2015 12:45       Assessment & Plan:   Problem List Items Addressed This  Visit    Anemia    Last hgb 10.  Recheck cbc and iron studies today.  Continue B12 injections.      Relevant Orders   CBC with Differential/Platelet (Completed)   Ferritin (Completed)   B12 deficiency    Continue B12 injections.        Diabetes mellitus with peripheral vascular disease ([place])    Sugars have been doing well.  Will stop actos.  Follow metabolic panel and [id number].        Relevant Medications   insulin glargine (LANTUS) 100 UNIT/ML injection   metoprolol succinate (TOPROL-XL) 25 MG 24 hr tablet   Other Relevant Orders   Hemoglobin A1c (Completed)   Hypercholesteremia    Low cholesterol diet and exercise.  Follow lipid panel.       Relevant Medications   metoprolol succinate (TOPROL-XL) 25 MG 24 hr tablet   Other Relevant Orders   Lipid panel   Hepatic function panel (Completed)   Hypertension - Primary    Blood pressure has been under reasonable control.  Continue same medication regimen.  Follow pressures.  Follow metabolic panel.        Relevant Medications   metoprolol succinate (TOPROL-XL) 25 MG 24 hr tablet   Other Relevant Orders   Basic metabolic panel (Completed)   Hypothyroidism    On thyroid replacement.  Follow tsh.         Relevant Medications   metoprolol succinate (TOPROL-XL) 25 MG 24 hr tablet   SYNTHROID 50 MCG tablet   Peripheral vascular disease ([facility])    Followed by vascular surgery.  Stable.        Relevant Medications   metoprolol succinate (TOPROL-XL) 25 MG 24 hr tablet       Einar Pheasant, MD

## 2015-09-25 ENCOUNTER — Other Ambulatory Visit: Payer: Self-pay

## 2015-09-25 MED ORDER — INSULIN GLARGINE 100 UNIT/ML SOLOSTAR PEN: PEN_INJECTOR | SUBCUTANEOUS | Status: DC

## 2015-10-03 ENCOUNTER — Telehealth: Payer: Self-pay

## 2015-10-03 DIAGNOSIS — R531 Weakness: Secondary | ICD-10-CM

## 2015-10-03 NOTE — Telephone Encounter (Signed)
Yes , i apologize. Its for diabetic shoes

## 2015-10-03 NOTE — Telephone Encounter (Signed)
The supply store states they don't have a form for you to fill out they need a script and an office note describing pt's last foot exam.

## 2015-10-03 NOTE — Telephone Encounter (Signed)
Pt came in and stated that she needs a DME order for a walker with a seat and orthopedic shoes. The company that the DME Order should be sent to is [employer] [facility] [place], [place] [address] phone : [phone], fax: [phone]. Please advise, thanks

## 2015-10-03 NOTE — Telephone Encounter (Signed)
I wrote the rx for the walker and placed in Toya's box at her desk.  Regarding orthopedic shoes, does she mean diabetic shoes?  Does she see podiatry.  There is usually a form to complete for the shoes.  Medical supply store can forward order form to [place].

## 2015-10-03 NOTE — Telephone Encounter (Signed)
Is this for diabetic shoes?

## 2015-10-08 ENCOUNTER — Encounter: Payer: Self-pay | Admitting: Internal Medicine

## 2015-10-08 NOTE — Assessment & Plan Note (Signed)
Continue B12 injections.   

## 2015-10-08 NOTE — Assessment & Plan Note (Signed)
On thyroid replacement.  Follow tsh.  

## 2015-10-08 NOTE — Assessment & Plan Note (Signed)
Followed by vascular surgery. Stable.   

## 2015-10-08 NOTE — Assessment & Plan Note (Signed)
Low cholesterol diet and exercise.  Follow lipid panel.   

## 2015-10-08 NOTE — Assessment & Plan Note (Signed)
Blood pressure has been under reasonable control.  Continue same medication regimen.  Follow pressures.  Follow metabolic panel.  

## 2015-10-08 NOTE — Assessment & Plan Note (Signed)
Sugars have been doing well.  Will stop actos.  Follow metabolic panel and [id number].

## 2015-10-08 NOTE — Assessment & Plan Note (Signed)
Last hgb 10.  Recheck cbc and iron studies today.  Continue B12 injections.

## 2015-10-11 NOTE — Telephone Encounter (Signed)
I received a Rx for the walker with seat. Please advise on the diabetic shoes. Pt wants them both mailed if possible

## 2015-10-11 NOTE — Telephone Encounter (Signed)
Can evaluate for diabetic shoes at next appt.  There are specific things that have to be documented.

## 2015-10-12 NOTE — Telephone Encounter (Signed)
Left voicemail to notify patient

## 2015-10-19 ENCOUNTER — Ambulatory Visit: Payer: Self-pay | Admitting: Internal Medicine

## 2015-10-19 DIAGNOSIS — R2681 Unsteadiness on feet: Secondary | ICD-10-CM | POA: Diagnosis not present

## 2015-10-19 NOTE — Telephone Encounter (Addendum)
Pt's family came by office today & requested that we fax a Rx to [facility] for a "walker with seat & brakes" (please include a diagnoses). Fax to: [phone]. I have created & Rx & included Dx for leg weakness & unsteady gait

## 2015-10-19 NOTE — Telephone Encounter (Signed)
rx for rolling walker printed.  Please fax.

## 2015-10-19 NOTE — Addendum Note (Signed)
Addended by: Alisa Graff on: 10/19/2015 03:35 PM   Modules accepted: Orders

## 2015-10-19 NOTE — Addendum Note (Signed)
Addended by: Leeanne Rio on: 10/19/2015 03:15 PM   Modules accepted: Orders

## 2015-12-10 DIAGNOSIS — B351 Tinea unguium: Secondary | ICD-10-CM | POA: Diagnosis not present

## 2015-12-10 DIAGNOSIS — E119 Type 2 diabetes mellitus without complications: Secondary | ICD-10-CM | POA: Diagnosis not present

## 2015-12-17 DIAGNOSIS — H26491 Other secondary cataract, right eye: Secondary | ICD-10-CM | POA: Diagnosis not present

## 2015-12-17 LAB — HM DIABETES EYE EXAM

## 2015-12-20 ENCOUNTER — Encounter: Payer: Self-pay | Admitting: Internal Medicine

## 2015-12-24 ENCOUNTER — Ambulatory Visit (INDEPENDENT_AMBULATORY_CARE_PROVIDER_SITE_OTHER): Payer: Medicare Other | Admitting: Internal Medicine

## 2015-12-24 ENCOUNTER — Encounter: Payer: Self-pay | Admitting: Internal Medicine

## 2015-12-24 VITALS — BP 154/72 | HR 73 | Temp 98.5°F | Ht <= 58 in | Wt 97.4 lb

## 2015-12-24 DIAGNOSIS — E039 Hypothyroidism, unspecified: Secondary | ICD-10-CM

## 2015-12-24 DIAGNOSIS — E538 Deficiency of other specified B group vitamins: Secondary | ICD-10-CM

## 2015-12-24 DIAGNOSIS — E7800 Pure hypercholesterolemia, unspecified: Secondary | ICD-10-CM

## 2015-12-24 DIAGNOSIS — D649 Anemia, unspecified: Secondary | ICD-10-CM

## 2015-12-24 DIAGNOSIS — H938X2 Other specified disorders of left ear: Secondary | ICD-10-CM

## 2015-12-24 DIAGNOSIS — E1151 Type 2 diabetes mellitus with diabetic peripheral angiopathy without gangrene: Secondary | ICD-10-CM

## 2015-12-24 DIAGNOSIS — I10 Essential (primary) hypertension: Secondary | ICD-10-CM

## 2015-12-24 DIAGNOSIS — I739 Peripheral vascular disease, unspecified: Secondary | ICD-10-CM

## 2015-12-24 NOTE — Assessment & Plan Note (Signed)
On thyroid replacement.  Follow tsh.  

## 2015-12-24 NOTE — Patient Instructions (Signed)
Increase amlodipine to 5mg twice a day.  

## 2015-12-24 NOTE — Assessment & Plan Note (Signed)
Sugars have been doing well.  actos stopped last visit.  a1c checked 09/2015 - 6.3.  Follow.

## 2015-12-24 NOTE — Assessment & Plan Note (Signed)
Last hgb 11.1.  Follow.

## 2015-12-24 NOTE — Progress Notes (Signed)
Patient ID: Tara [name], female   DOB: [birth year], 82 y.o.   MRN: [id number]   Subjective:    Patient ID: Tara [name], female    DOB: [birth year], 82 y.o.   MRN: [id number]  HPI  Patient here for a scheduled follow up.  She is accompanied by her daughter.  History obtained from both of them.  Weight is up.  States she is eating well.  She does report some concern regarding some left ear fullness.  Had tube placed previously.  Request referral back to ENT.  No pain.  Allergies controlled.  Breathing stable.  No chest pain. No acid reflux.  No abdominal pain or cramping.  Bowels stable.     Past Medical History  Diagnosis Date  . Asthma   . Diabetes mellitus ([place])   . Environmental allergies   . Hypertension    Past Surgical History  Procedure Laterality Date  . Appendectomy    . Tubal ligation     Family History  Problem Relation Age of Onset  . Hypertension Mother   . Cancer Brother     lung (question)  . Cancer Sister     questionable type   Social History   Social History  . Marital Status: Widowed    Spouse Name: N/A  . Number of Children: N/A  . Years of Education: N/A   Social History Main Topics  . Smoking status: Never Smoker   . Smokeless tobacco: Never Used  . Alcohol Use: No  . Drug Use: No  . Sexual Activity: Not Asked   Other Topics Concern  . None   Social History Narrative    Outpatient Encounter Prescriptions as of 12/24/2015  Medication Sig  . acetaminophen (TYLENOL) 325 MG tablet Take 650 mg by mouth every 6 (six) hours as needed.  Marland Kitchen amLODipine (NORVASC) 5 MG tablet TAKE ONE TABLET TWICE DAILY  . aspirin 81 MG tablet Take 81 mg by mouth daily.  . Calcium Carbonate-Vitamin D (CALTRATE 600+D PO) Take by mouth.  . Cholecalciferol (D3 SUPER STRENGTH) 2000 UNITS CAPS Take by mouth.  . fexofenadine (ALLEGRA) 180 MG tablet Take 180 mg by mouth daily.  . fluticasone (FLONASE) 50 MCG/ACT nasal spray Place 2 sprays into both nostrils daily.  Marland Kitchen  glucose blood (ACCU-CHEK AVIVA PLUS) test strip CHECK SUGAR ONCE DAILY  . hydrALAZINE (APRESOLINE) 50 MG tablet Take 50 mg by mouth 2 (two) times daily.  . insulin glargine (LANTUS) 100 UNIT/ML injection INJECT 6 UNITS INTO THE SKIN DAILY  . Insulin Pen Needle (SURE COMFORT PEN NEEDLES) 31G X 8 MM MISC AS DIRECTED  . metoprolol succinate (TOPROL-XL) 25 MG 24 hr tablet TAKE ONE (1) TABLET BY MOUTH EVERY DAY  . SYNTHROID 50 MCG tablet TAKE ONE (1) TABLET EACH DAY  . Insulin Glargine (LANTUS SOLOSTAR) 100 UNIT/ML Solostar Pen INJECT 6 UNITS UNDER THE SKIN ONCE DAILY  . Multiple Vitamin (MULTIVITAMIN) tablet Take 1 tablet by mouth daily. Reported on 12/24/2015  . [DISCONTINUED] pioglitazone (ACTOS) 15 MG tablet TAKE ONE (1) TABLET BY MOUTH EVERY DAY (Patient not taking: Reported on 12/24/2015)   No facility-administered encounter medications on file as of 12/24/2015.    Review of Systems  Constitutional: Negative for appetite change and unexpected weight change.  HENT: Negative for congestion and sinus pressure.        Some left ear fullness.   Respiratory: Negative for cough, chest tightness and shortness of breath.   Cardiovascular: Negative for chest pain, palpitations  and leg swelling.  Gastrointestinal: Negative for nausea, vomiting, abdominal pain and diarrhea.  Genitourinary: Negative for dysuria and difficulty urinating.  Musculoskeletal: Negative for myalgias and joint swelling.  Skin: Negative for color change and rash.  Neurological: Negative for dizziness, light-headedness and headaches.  Psychiatric/Behavioral: Negative for dysphoric mood and agitation.       Objective:    Physical Exam  Constitutional: She appears well-developed and well-nourished. No distress.  HENT:  Nose: Nose normal.  Mouth/Throat: Oropharynx is clear and moist.  Tube appears to be in place left ear.  No erythema.   Neck: Neck supple. No thyromegaly present.  Cardiovascular: Normal rate and regular  rhythm.   Pulmonary/Chest: Breath sounds normal. No respiratory distress. She has no wheezes.  Abdominal: Soft. Bowel sounds are normal. There is no tenderness.  Musculoskeletal: She exhibits no edema or tenderness.  Lymphadenopathy:    She has no cervical adenopathy.  Skin: No rash noted. No erythema.  Psychiatric: She has a normal mood and affect. Her behavior is normal.    BP 154/72 mmHg  Pulse 73  Temp(Src) 98.5 F (36.9 C) (Oral)  Ht 4\' 10"  (1.473 m)  Wt 97 lb 6.4 oz (44.18 kg)  BMI 20.36 kg/m2  SpO2 98% Wt Readings from Last 3 Encounters:  12/24/15 97 lb 6.4 oz (44.18 kg)  09/24/15 104 lb (47.174 kg)  05/23/15 101 lb 4 oz (45.927 kg)     Lab Results  Component Value Date   WBC 6.0 09/24/2015   HGB 11.1* 09/24/2015   HCT 34.9* 09/24/2015   PLT 220.0 09/24/2015   GLUCOSE 121* 09/24/2015   CHOL 209* 06/21/2014   TRIG 105.0 06/21/2014   HDL 77.00 06/21/2014   LDLDIRECT 107.1 07/29/2013   LDLCALC 111* 06/21/2014   ALT 12 09/24/2015   AST 25 09/24/2015   NA 143 09/24/2015   K 3.9 09/24/2015   CL 105 09/24/2015   CREATININE 0.80 09/24/2015   BUN 26* 09/24/2015   CO2 30 09/24/2015   TSH 1.13 03/05/2015   INR 1.0 01/25/2013   HGBA1C 6.3 09/24/2015   MICROALBUR 115.0 Repeated and verified X2.* 07/29/2013    Dg Cervical Spine Complete  02/02/2015  CLINICAL DATA:  Acute neck pain without reported injury. EXAM: CERVICAL SPINE  4+ VIEWS COMPARISON:  None. FINDINGS: Only the first 6 cervical vertebra are completely visualized on this study. Swimmer's projection could not be obtained and therefore C7 is incompletely visualized. Grade 1 anterolisthesis of C4-5 is noted most likely due to posterior facet joint degenerative change. Degenerative disc disease is also noted at C3-4, C5-6 C6-7 with anterior osteophyte formation. IMPRESSION: Grade 1 anterolisthesis of C4-5 is noted most likely due to posterior facet joint hypertrophy. No definite fracture is noted, although C7 is  incompletely visualized as a swimmer's projection could not be obtained. If there is concern for traumatic injury, CT scan of the cervical spine would be recommended for further evaluation. Electronically Signed   By: Marijo Conception, M.D.   On: 02/02/2015 12:45       Assessment & Plan:   Problem List Items Addressed This Visit    Anemia    Last hgb 11.1.  Follow.       B12 deficiency    Continue B12 injections.        Diabetes mellitus with peripheral vascular disease ([place])    Sugars have been doing well.  actos stopped last visit.  a1c checked 09/2015 - 6.3.  Follow.  Hypercholesteremia    Follow lipid panel.  Lipid panel not checked with latest labs.  Not fasting.  Follow.       Hypertension - Primary    Blood pressure as outlined.  Increased on recheck.  She has not been taking the amlodipine twice a day.  Will increased back up to twice a day. Follow for swelling.  Follow pressures.  Follow metabolic panel.        Hypothyroidism    On thyroid replacement.  Follow tsh.       Peripheral vascular disease ([place])    Followed by vascular surgery.        Sensation of fullness in left ear    Saw ENT previously.  Is s/p left tube placement.  Given persistent symptoms, refer back to ENT.       Relevant Orders   Ambulatory referral to ENT       Einar Pheasant, MD

## 2015-12-24 NOTE — Assessment & Plan Note (Signed)
Continue B12 injections.   

## 2015-12-24 NOTE — Assessment & Plan Note (Signed)
Follow lipid panel.  Lipid panel not checked with latest labs.  Not fasting.  Follow.

## 2015-12-24 NOTE — Progress Notes (Signed)
Pre visit review using our clinic review tool, if applicable. No additional management support is needed unless otherwise documented below in the visit note. 

## 2015-12-24 NOTE — Assessment & Plan Note (Signed)
Followed by vascular surgery. 

## 2016-01-06 ENCOUNTER — Encounter: Payer: Self-pay | Admitting: Internal Medicine

## 2016-01-06 DIAGNOSIS — H938X2 Other specified disorders of left ear: Secondary | ICD-10-CM | POA: Insufficient documentation

## 2016-01-06 NOTE — Assessment & Plan Note (Signed)
Saw ENT previously.  Is s/p left tube placement.  Given persistent symptoms, refer back to ENT.

## 2016-01-06 NOTE — Assessment & Plan Note (Signed)
Blood pressure as outlined.  Increased on recheck.  She has not been taking the amlodipine twice a day.  Will increased back up to twice a day. Follow for swelling.  Follow pressures.  Follow metabolic panel.

## 2016-02-01 DIAGNOSIS — H6982 Other specified disorders of Eustachian tube, left ear: Secondary | ICD-10-CM | POA: Diagnosis not present

## 2016-02-15 DIAGNOSIS — H6982 Other specified disorders of Eustachian tube, left ear: Secondary | ICD-10-CM | POA: Diagnosis not present

## 2016-02-26 ENCOUNTER — Other Ambulatory Visit: Payer: Self-pay | Admitting: Internal Medicine

## 2016-02-26 NOTE — Telephone Encounter (Signed)
This is a historical drug, please advise for refill, thanks

## 2016-02-26 NOTE — Telephone Encounter (Signed)
Pt daughter called needing a refill for hydrALAZINE (APRESOLINE) 50 MG tablet.  Pharmacy is [facility], [place]  Call pt daughter @ [phone]. Thank you!

## 2016-02-27 MED ORDER — HYDRALAZINE HCL 50 MG PO TABS: 50.0000 mg | ORAL_TABLET | Freq: Two times a day (BID) | ORAL | Status: DC

## 2016-02-27 NOTE — Telephone Encounter (Signed)
Sent to pharmacy, spoke with patient, she is seeing cardiology, but not all the time, so I refilled per your recommendations. thanks

## 2016-02-27 NOTE — Telephone Encounter (Signed)
This has been prescribed by the cardiologist.  If still seeing, the cardiologist needs to refill.  If not and she is still taking regularly, then ok to refill x 3.

## 2016-03-13 ENCOUNTER — Ambulatory Visit (INDEPENDENT_AMBULATORY_CARE_PROVIDER_SITE_OTHER): Payer: Medicare Other | Admitting: Internal Medicine

## 2016-03-13 ENCOUNTER — Encounter: Payer: Self-pay | Admitting: Internal Medicine

## 2016-03-13 VITALS — BP 100/52 | HR 72 | Temp 98.4°F | Resp 17 | Ht <= 58 in | Wt 98.2 lb

## 2016-03-13 DIAGNOSIS — E1151 Type 2 diabetes mellitus with diabetic peripheral angiopathy without gangrene: Secondary | ICD-10-CM

## 2016-03-13 DIAGNOSIS — D649 Anemia, unspecified: Secondary | ICD-10-CM | POA: Diagnosis not present

## 2016-03-13 DIAGNOSIS — R29898 Other symptoms and signs involving the musculoskeletal system: Secondary | ICD-10-CM

## 2016-03-13 DIAGNOSIS — R634 Abnormal weight loss: Secondary | ICD-10-CM

## 2016-03-13 DIAGNOSIS — I10 Essential (primary) hypertension: Secondary | ICD-10-CM

## 2016-03-13 DIAGNOSIS — E039 Hypothyroidism, unspecified: Secondary | ICD-10-CM | POA: Diagnosis not present

## 2016-03-13 DIAGNOSIS — E7800 Pure hypercholesterolemia, unspecified: Secondary | ICD-10-CM

## 2016-03-13 DIAGNOSIS — I739 Peripheral vascular disease, unspecified: Secondary | ICD-10-CM

## 2016-03-13 DIAGNOSIS — M545 Low back pain: Secondary | ICD-10-CM

## 2016-03-13 DIAGNOSIS — E538 Deficiency of other specified B group vitamins: Secondary | ICD-10-CM

## 2016-03-13 DIAGNOSIS — R2681 Unsteadiness on feet: Secondary | ICD-10-CM

## 2016-03-13 LAB — CBC WITH DIFFERENTIAL/PLATELET
BASOS ABS: 0 10*3/uL (ref 0.0–0.1)
Basophils Relative: 0 % (ref 0.0–3.0)
Eosinophils Absolute: 0.3 10*3/uL (ref 0.0–0.7)
Eosinophils Relative: 5.3 % — ABNORMAL HIGH (ref 0.0–5.0)
HCT: 31.9 % — ABNORMAL LOW (ref 36.0–46.0)
Hemoglobin: 10.6 g/dL — ABNORMAL LOW (ref 12.0–15.0)
LYMPHS PCT: 30.8 % (ref 12.0–46.0)
Lymphs Abs: 1.7 10*3/uL (ref 0.7–4.0)
MCHC: 33.2 g/dL (ref 30.0–36.0)
MCV: 92 fl (ref 78.0–100.0)
MONOS PCT: 5.1 % (ref 3.0–12.0)
Monocytes Absolute: 0.3 10*3/uL (ref 0.1–1.0)
NEUTROS ABS: 3.2 10*3/uL (ref 1.4–7.7)
NEUTROS PCT: 58.8 % (ref 43.0–77.0)
PLATELETS: 226 10*3/uL (ref 150.0–400.0)
RBC: 3.47 Mil/uL — ABNORMAL LOW (ref 3.87–5.11)
RDW: 16.2 % — ABNORMAL HIGH (ref 11.5–15.5)
WBC: 5.4 10*3/uL (ref 4.0–10.5)

## 2016-03-13 LAB — BASIC METABOLIC PANEL
BUN: 30 mg/dL — AB (ref 6–23)
CALCIUM: 9.4 mg/dL (ref 8.4–10.5)
CO2: 29 meq/L (ref 19–32)
Chloride: 109 mEq/L (ref 96–112)
Creatinine, Ser: 0.88 mg/dL (ref 0.40–1.20)
GFR: 76.61 mL/min (ref >60.00)
GLUCOSE: 133 mg/dL — AB (ref 70–99)
POTASSIUM: 4.3 meq/L (ref 3.5–5.1)
SODIUM: 144 meq/L (ref 135–145)

## 2016-03-13 LAB — HEPATIC FUNCTION PANEL
ALK PHOS: 51 U/L (ref 39–117)
ALT: 15 U/L (ref 0–35)
AST: 24 U/L (ref 0–37)
Albumin: 3.5 g/dL (ref 3.5–5.2)
BILIRUBIN DIRECT: 0.1 mg/dL (ref 0.0–0.3)
BILIRUBIN TOTAL: 0.3 mg/dL (ref 0.2–1.2)
TOTAL PROTEIN: 7.4 g/dL (ref 6.0–8.3)

## 2016-03-13 LAB — TSH: TSH: 2.61 u[IU]/mL (ref 0.35–4.50)

## 2016-03-13 LAB — HEMOGLOBIN A1C: Hgb A1c MFr Bld: 6 % (ref 4.6–6.5)

## 2016-03-13 LAB — FERRITIN: Ferritin: 101.3 ng/mL (ref 10.0–291.0)

## 2016-03-13 NOTE — Progress Notes (Signed)
Patient ID: Tara [name], female   DOB: [birth year], 82 y.o.   MRN: [id number]   Subjective:    Patient ID: Tara [name], female    DOB: 1917/12/14, 82 y.o.   MRN: [id number]  HPI  Patient here for a scheduled follow up.  She states she has fallen 2x since her last visit here.  Sates both times she was walking and just felt her legs gave out.  States legs don't feel as strong.  Uses her arms to push herself up out of her chair.  She did not hurt herself when she fell.  She landed on her knee.  No head injury.  States her back feels weak.  No light headedness.  No dizziness.  No chest pain or sob.  No acid reflux.  No abdominal pain or cramping.  Bowels stable.  Sugars doing well.  No low blood sugars.     Past Medical History:  Diagnosis Date  . Asthma   . Diabetes mellitus ([place])   . Environmental allergies   . Hypertension    Past Surgical History:  Procedure Laterality Date  . APPENDECTOMY    . TUBAL LIGATION     Family History  Problem Relation Age of Onset  . Hypertension Mother   . Cancer Brother     lung (question)  . Cancer Sister     questionable type   Social History   Social History  . Marital status: Widowed    Spouse name: N/A  . Number of children: N/A  . Years of education: N/A   Social History Main Topics  . Smoking status: Never Smoker  . Smokeless tobacco: Never Used  . Alcohol use No  . Drug use: No  . Sexual activity: Not Asked   Other Topics Concern  . None   Social History Narrative  . None    Outpatient Encounter Prescriptions as of 03/13/2016  Medication Sig  . acetaminophen (TYLENOL) 325 MG tablet Take 650 mg by mouth every 6 (six) hours as needed.  Marland Kitchen amLODipine (NORVASC) 5 MG tablet TAKE ONE TABLET TWICE DAILY  . aspirin 81 MG tablet Take 81 mg by mouth daily.  . Calcium Carbonate-Vitamin D (CALTRATE 600+D PO) Take by mouth.  . Cholecalciferol (D3 SUPER STRENGTH) 2000 UNITS CAPS Take by mouth.  . fexofenadine (ALLEGRA) 180 MG tablet  Take 180 mg by mouth daily.  . fluticasone (FLONASE) 50 MCG/ACT nasal spray Place 2 sprays into both nostrils daily.  Marland Kitchen glucose blood (ACCU-CHEK AVIVA PLUS) test strip CHECK SUGAR ONCE DAILY  . hydrALAZINE (APRESOLINE) 50 MG tablet Take 1 tablet (50 mg total) by mouth 2 (two) times daily.  . Insulin Glargine (LANTUS SOLOSTAR) 100 UNIT/ML Solostar Pen INJECT 6 UNITS UNDER THE SKIN ONCE DAILY  . insulin glargine (LANTUS) 100 UNIT/ML injection INJECT 6 UNITS INTO THE SKIN DAILY  . Insulin Pen Needle (SURE COMFORT PEN NEEDLES) 31G X 8 MM MISC AS DIRECTED  . metoprolol succinate (TOPROL-XL) 25 MG 24 hr tablet TAKE ONE (1) TABLET BY MOUTH EVERY DAY  . Multiple Vitamin (MULTIVITAMIN) tablet Take 1 tablet by mouth daily. Reported on 12/24/2015  . SYNTHROID 50 MCG tablet TAKE ONE (1) TABLET EACH DAY   No facility-administered encounter medications on file as of 03/13/2016.     Review of Systems  Constitutional: Negative for appetite change.       Weight stable from previous check.  Down from check prior.   HENT: Negative for congestion and sinus pressure.  Respiratory: Negative for cough, chest tightness and shortness of breath.   Cardiovascular: Negative for chest pain, palpitations and leg swelling.  Gastrointestinal: Negative for abdominal pain, diarrhea, nausea and vomiting.  Genitourinary: Negative for difficulty urinating and dysuria.  Musculoskeletal:       Back and legs weaker.  No significant pain in her back.    Skin: Negative for color change and rash.  Neurological: Negative for dizziness, light-headedness and headaches.  Psychiatric/Behavioral: Negative for agitation and dysphoric mood.       Objective:    Physical Exam  Constitutional: She appears well-developed and well-nourished. No distress.  HENT:  Nose: Nose normal.  Mouth/Throat: Oropharynx is clear and moist.  Neck: Neck supple. No thyromegaly present.  Cardiovascular: Normal rate and regular rhythm.     Pulmonary/Chest: Breath sounds normal. No respiratory distress. She has no wheezes.  Abdominal: Soft. Bowel sounds are normal. There is no tenderness.  Musculoskeletal: She exhibits no edema or tenderness.  Lymphadenopathy:    She has no cervical adenopathy.  Skin: No rash noted. No erythema.  Psychiatric: She has a normal mood and affect. Her behavior is normal.    BP (!) 100/52 (BP Location: Right Arm, Patient Position: Sitting, Cuff Size: Normal)   Pulse 72   Temp 98.4 F (36.9 C) (Oral)   Resp 17   Ht 4\' 10"  (1.473 m)   Wt 98 lb 4 oz (44.6 kg)   SpO2 95%   BMI 20.53 kg/m  Wt Readings from Last 3 Encounters:  03/13/16 98 lb 4 oz (44.6 kg)  12/24/15 97 lb 6.4 oz (44.2 kg)  09/24/15 104 lb (47.2 kg)     Lab Results  Component Value Date   WBC 5.4 03/13/2016   HGB 10.6 (L) 03/13/2016   HCT 31.9 (L) 03/13/2016   PLT 226.0 03/13/2016   GLUCOSE 133 (H) 03/13/2016   CHOL 209 (H) 06/21/2014   TRIG 105.0 06/21/2014   HDL 77.00 06/21/2014   LDLDIRECT 107.1 07/29/2013   LDLCALC 111 (H) 06/21/2014   ALT 15 03/13/2016   AST 24 03/13/2016   NA 144 03/13/2016   K 4.3 03/13/2016   CL 109 03/13/2016   CREATININE 0.88 03/13/2016   BUN 30 (H) 03/13/2016   CO2 29 03/13/2016   TSH 2.61 03/13/2016   INR 1.0 01/25/2013   HGBA1C 6.0 03/13/2016   MICROALBUR 115.0 Repeated and verified X2. (H) 07/29/2013       Assessment & Plan:   Problem List Items Addressed This Visit    Anemia    Hgb slightly decreased.  Recheck cbc today.        Relevant Orders   CBC with Differential/Platelet (Completed)   Ferritin (Completed)   B12 deficiency    Continue B12 injections.       Back pain    No significant pain reported today.  Reports more leg and back weakness.  Discussed options.  Will set up home health - physical therapy.        Diabetes mellitus with peripheral vascular disease ([place])    Sugars doing well.  Decreased insulin to 4 units per day with plans to taper off.  Check  metabolic panel and [id number].       Relevant Orders   Hemoglobin A1c (Completed)   Hypercholesteremia   Relevant Orders   Hepatic function panel (Completed)   Hypertension - Primary    Blood pressure on recheck with systolic reading approximately 140.  Continue same medication regimen.  Follow pressures.  Follow metabolic panel.       Relevant Orders   Basic metabolic panel (Completed)   Hypothyroidism    On thyroid replacement.  Follow tsh.       Relevant Orders   TSH (Completed)   Leg weakness    See above.  Has fallen two times recently.  Feels as if legs and back are not as strong.  Refer to home health for physical therapy for strengthening and gait instability.        Relevant Orders   Ambulatory referral to Home Health   Loss of weight    She is eating.  Weight stable from previous check.  Follow.       Peripheral vascular disease ([place])    Followed by vascular surgery.  No calf pain with ambulation.        Unsteady gait    Has a walker.  Has fallen two times since her last visit.  Feeling like her back and legs are not as strong.  Arrange home health physical therapy.       Relevant Orders   Ambulatory referral to [facility]    Other Visit Diagnoses   None.      Einar Pheasant, MD

## 2016-03-13 NOTE — Progress Notes (Signed)
Pre-visit discussion using our clinic review tool. No additional management support is needed unless otherwise documented below in the visit note.  

## 2016-03-16 ENCOUNTER — Encounter: Payer: Self-pay | Admitting: Internal Medicine

## 2016-03-16 NOTE — Assessment & Plan Note (Signed)
She is eating.  Weight stable from previous check.  Follow.

## 2016-03-16 NOTE — Assessment & Plan Note (Signed)
Followed by vascular surgery.  No calf pain with ambulation.

## 2016-03-16 NOTE — Assessment & Plan Note (Signed)
Hgb slightly decreased.  Recheck cbc today.

## 2016-03-16 NOTE — Assessment & Plan Note (Signed)
Continue B12 injections.   

## 2016-03-16 NOTE — Assessment & Plan Note (Signed)
See above.  Has fallen two times recently.  Feels as if legs and back are not as strong.  Refer to home health for physical therapy for strengthening and gait instability.

## 2016-03-16 NOTE — Assessment & Plan Note (Signed)
Blood pressure on recheck with systolic reading approximately 140.  Continue same medication regimen.  Follow pressures.  Follow metabolic panel.

## 2016-03-16 NOTE — Assessment & Plan Note (Signed)
No significant pain reported today.  Reports more leg and back weakness.  Discussed options.  Will set up home health - physical therapy.

## 2016-03-16 NOTE — Assessment & Plan Note (Signed)
Has a walker.  Has fallen two times since her last visit.  Feeling like her back and legs are not as strong.  Arrange home health physical therapy.

## 2016-03-16 NOTE — Assessment & Plan Note (Signed)
Sugars doing well.  Decreased insulin to 4 units per day with plans to taper off.  Check metabolic panel and A999333.

## 2016-03-16 NOTE — Assessment & Plan Note (Signed)
On thyroid replacement.  Follow tsh.  

## 2016-03-25 DIAGNOSIS — R296 Repeated falls: Secondary | ICD-10-CM

## 2016-03-25 DIAGNOSIS — J45909 Unspecified asthma, uncomplicated: Secondary | ICD-10-CM

## 2016-03-25 DIAGNOSIS — R29898 Other symptoms and signs involving the musculoskeletal system: Secondary | ICD-10-CM | POA: Diagnosis not present

## 2016-03-25 DIAGNOSIS — Z9181 History of falling: Secondary | ICD-10-CM

## 2016-03-25 DIAGNOSIS — Z794 Long term (current) use of insulin: Secondary | ICD-10-CM

## 2016-03-25 DIAGNOSIS — I10 Essential (primary) hypertension: Secondary | ICD-10-CM | POA: Diagnosis not present

## 2016-03-25 DIAGNOSIS — R2689 Other abnormalities of gait and mobility: Secondary | ICD-10-CM | POA: Diagnosis not present

## 2016-03-25 DIAGNOSIS — E1151 Type 2 diabetes mellitus with diabetic peripheral angiopathy without gangrene: Secondary | ICD-10-CM | POA: Diagnosis not present

## 2016-03-25 DIAGNOSIS — E538 Deficiency of other specified B group vitamins: Secondary | ICD-10-CM

## 2016-03-25 DIAGNOSIS — R2681 Unsteadiness on feet: Secondary | ICD-10-CM | POA: Diagnosis not present

## 2016-03-25 DIAGNOSIS — D649 Anemia, unspecified: Secondary | ICD-10-CM

## 2016-03-25 DIAGNOSIS — R531 Weakness: Secondary | ICD-10-CM | POA: Diagnosis not present

## 2016-03-27 ENCOUNTER — Other Ambulatory Visit: Payer: Self-pay | Admitting: Internal Medicine

## 2016-03-28 DIAGNOSIS — J45909 Unspecified asthma, uncomplicated: Secondary | ICD-10-CM | POA: Diagnosis not present

## 2016-03-28 DIAGNOSIS — R2689 Other abnormalities of gait and mobility: Secondary | ICD-10-CM | POA: Diagnosis not present

## 2016-03-28 DIAGNOSIS — Z794 Long term (current) use of insulin: Secondary | ICD-10-CM | POA: Diagnosis not present

## 2016-03-28 DIAGNOSIS — D649 Anemia, unspecified: Secondary | ICD-10-CM | POA: Diagnosis not present

## 2016-03-28 DIAGNOSIS — E1151 Type 2 diabetes mellitus with diabetic peripheral angiopathy without gangrene: Secondary | ICD-10-CM | POA: Diagnosis not present

## 2016-03-28 DIAGNOSIS — Z9181 History of falling: Secondary | ICD-10-CM | POA: Diagnosis not present

## 2016-03-28 DIAGNOSIS — I10 Essential (primary) hypertension: Secondary | ICD-10-CM | POA: Diagnosis not present

## 2016-03-28 DIAGNOSIS — E538 Deficiency of other specified B group vitamins: Secondary | ICD-10-CM | POA: Diagnosis not present

## 2016-03-28 DIAGNOSIS — R296 Repeated falls: Secondary | ICD-10-CM | POA: Diagnosis not present

## 2016-03-28 DIAGNOSIS — R531 Weakness: Secondary | ICD-10-CM | POA: Diagnosis not present

## 2016-04-02 DIAGNOSIS — D649 Anemia, unspecified: Secondary | ICD-10-CM | POA: Diagnosis not present

## 2016-04-02 DIAGNOSIS — R2689 Other abnormalities of gait and mobility: Secondary | ICD-10-CM | POA: Diagnosis not present

## 2016-04-02 DIAGNOSIS — I10 Essential (primary) hypertension: Secondary | ICD-10-CM | POA: Diagnosis not present

## 2016-04-02 DIAGNOSIS — E1151 Type 2 diabetes mellitus with diabetic peripheral angiopathy without gangrene: Secondary | ICD-10-CM | POA: Diagnosis not present

## 2016-04-02 DIAGNOSIS — Z794 Long term (current) use of insulin: Secondary | ICD-10-CM | POA: Diagnosis not present

## 2016-04-02 DIAGNOSIS — J45909 Unspecified asthma, uncomplicated: Secondary | ICD-10-CM | POA: Diagnosis not present

## 2016-04-02 DIAGNOSIS — R531 Weakness: Secondary | ICD-10-CM | POA: Diagnosis not present

## 2016-04-02 DIAGNOSIS — Z9181 History of falling: Secondary | ICD-10-CM | POA: Diagnosis not present

## 2016-04-02 DIAGNOSIS — E538 Deficiency of other specified B group vitamins: Secondary | ICD-10-CM | POA: Diagnosis not present

## 2016-04-02 DIAGNOSIS — R296 Repeated falls: Secondary | ICD-10-CM | POA: Diagnosis not present

## 2016-04-04 DIAGNOSIS — D649 Anemia, unspecified: Secondary | ICD-10-CM | POA: Diagnosis not present

## 2016-04-04 DIAGNOSIS — E538 Deficiency of other specified B group vitamins: Secondary | ICD-10-CM | POA: Diagnosis not present

## 2016-04-04 DIAGNOSIS — Z9181 History of falling: Secondary | ICD-10-CM | POA: Diagnosis not present

## 2016-04-04 DIAGNOSIS — R2689 Other abnormalities of gait and mobility: Secondary | ICD-10-CM | POA: Diagnosis not present

## 2016-04-04 DIAGNOSIS — E1151 Type 2 diabetes mellitus with diabetic peripheral angiopathy without gangrene: Secondary | ICD-10-CM | POA: Diagnosis not present

## 2016-04-04 DIAGNOSIS — Z794 Long term (current) use of insulin: Secondary | ICD-10-CM | POA: Diagnosis not present

## 2016-04-04 DIAGNOSIS — J45909 Unspecified asthma, uncomplicated: Secondary | ICD-10-CM | POA: Diagnosis not present

## 2016-04-04 DIAGNOSIS — R531 Weakness: Secondary | ICD-10-CM | POA: Diagnosis not present

## 2016-04-04 DIAGNOSIS — I10 Essential (primary) hypertension: Secondary | ICD-10-CM | POA: Diagnosis not present

## 2016-04-04 DIAGNOSIS — R296 Repeated falls: Secondary | ICD-10-CM | POA: Diagnosis not present

## 2016-04-08 DIAGNOSIS — E538 Deficiency of other specified B group vitamins: Secondary | ICD-10-CM | POA: Diagnosis not present

## 2016-04-08 DIAGNOSIS — R2689 Other abnormalities of gait and mobility: Secondary | ICD-10-CM | POA: Diagnosis not present

## 2016-04-08 DIAGNOSIS — E1151 Type 2 diabetes mellitus with diabetic peripheral angiopathy without gangrene: Secondary | ICD-10-CM | POA: Diagnosis not present

## 2016-04-08 DIAGNOSIS — R531 Weakness: Secondary | ICD-10-CM | POA: Diagnosis not present

## 2016-04-08 DIAGNOSIS — J45909 Unspecified asthma, uncomplicated: Secondary | ICD-10-CM | POA: Diagnosis not present

## 2016-04-08 DIAGNOSIS — D649 Anemia, unspecified: Secondary | ICD-10-CM | POA: Diagnosis not present

## 2016-04-08 DIAGNOSIS — Z9181 History of falling: Secondary | ICD-10-CM | POA: Diagnosis not present

## 2016-04-08 DIAGNOSIS — Z794 Long term (current) use of insulin: Secondary | ICD-10-CM | POA: Diagnosis not present

## 2016-04-08 DIAGNOSIS — R296 Repeated falls: Secondary | ICD-10-CM | POA: Diagnosis not present

## 2016-04-08 DIAGNOSIS — I10 Essential (primary) hypertension: Secondary | ICD-10-CM | POA: Diagnosis not present

## 2016-04-10 DIAGNOSIS — Z794 Long term (current) use of insulin: Secondary | ICD-10-CM | POA: Diagnosis not present

## 2016-04-10 DIAGNOSIS — I10 Essential (primary) hypertension: Secondary | ICD-10-CM | POA: Diagnosis not present

## 2016-04-10 DIAGNOSIS — R531 Weakness: Secondary | ICD-10-CM | POA: Diagnosis not present

## 2016-04-10 DIAGNOSIS — J45909 Unspecified asthma, uncomplicated: Secondary | ICD-10-CM | POA: Diagnosis not present

## 2016-04-10 DIAGNOSIS — E538 Deficiency of other specified B group vitamins: Secondary | ICD-10-CM | POA: Diagnosis not present

## 2016-04-10 DIAGNOSIS — D649 Anemia, unspecified: Secondary | ICD-10-CM | POA: Diagnosis not present

## 2016-04-10 DIAGNOSIS — R296 Repeated falls: Secondary | ICD-10-CM | POA: Diagnosis not present

## 2016-04-10 DIAGNOSIS — E1151 Type 2 diabetes mellitus with diabetic peripheral angiopathy without gangrene: Secondary | ICD-10-CM | POA: Diagnosis not present

## 2016-04-10 DIAGNOSIS — Z9181 History of falling: Secondary | ICD-10-CM | POA: Diagnosis not present

## 2016-04-10 DIAGNOSIS — R2689 Other abnormalities of gait and mobility: Secondary | ICD-10-CM | POA: Diagnosis not present

## 2016-04-16 DIAGNOSIS — J45909 Unspecified asthma, uncomplicated: Secondary | ICD-10-CM | POA: Diagnosis not present

## 2016-04-16 DIAGNOSIS — Z9181 History of falling: Secondary | ICD-10-CM | POA: Diagnosis not present

## 2016-04-16 DIAGNOSIS — R296 Repeated falls: Secondary | ICD-10-CM | POA: Diagnosis not present

## 2016-04-16 DIAGNOSIS — D649 Anemia, unspecified: Secondary | ICD-10-CM | POA: Diagnosis not present

## 2016-04-16 DIAGNOSIS — Z794 Long term (current) use of insulin: Secondary | ICD-10-CM | POA: Diagnosis not present

## 2016-04-16 DIAGNOSIS — E1151 Type 2 diabetes mellitus with diabetic peripheral angiopathy without gangrene: Secondary | ICD-10-CM | POA: Diagnosis not present

## 2016-04-16 DIAGNOSIS — R2689 Other abnormalities of gait and mobility: Secondary | ICD-10-CM | POA: Diagnosis not present

## 2016-04-16 DIAGNOSIS — E538 Deficiency of other specified B group vitamins: Secondary | ICD-10-CM | POA: Diagnosis not present

## 2016-04-16 DIAGNOSIS — R531 Weakness: Secondary | ICD-10-CM | POA: Diagnosis not present

## 2016-04-16 DIAGNOSIS — I10 Essential (primary) hypertension: Secondary | ICD-10-CM | POA: Diagnosis not present

## 2016-04-18 DIAGNOSIS — D649 Anemia, unspecified: Secondary | ICD-10-CM | POA: Diagnosis not present

## 2016-04-18 DIAGNOSIS — I10 Essential (primary) hypertension: Secondary | ICD-10-CM | POA: Diagnosis not present

## 2016-04-18 DIAGNOSIS — J45909 Unspecified asthma, uncomplicated: Secondary | ICD-10-CM | POA: Diagnosis not present

## 2016-04-18 DIAGNOSIS — Z9181 History of falling: Secondary | ICD-10-CM | POA: Diagnosis not present

## 2016-04-18 DIAGNOSIS — E1151 Type 2 diabetes mellitus with diabetic peripheral angiopathy without gangrene: Secondary | ICD-10-CM | POA: Diagnosis not present

## 2016-04-18 DIAGNOSIS — Z794 Long term (current) use of insulin: Secondary | ICD-10-CM | POA: Diagnosis not present

## 2016-04-18 DIAGNOSIS — R296 Repeated falls: Secondary | ICD-10-CM | POA: Diagnosis not present

## 2016-04-18 DIAGNOSIS — R2689 Other abnormalities of gait and mobility: Secondary | ICD-10-CM | POA: Diagnosis not present

## 2016-04-18 DIAGNOSIS — R531 Weakness: Secondary | ICD-10-CM | POA: Diagnosis not present

## 2016-04-18 DIAGNOSIS — E538 Deficiency of other specified B group vitamins: Secondary | ICD-10-CM | POA: Diagnosis not present

## 2016-04-22 DIAGNOSIS — Z794 Long term (current) use of insulin: Secondary | ICD-10-CM | POA: Diagnosis not present

## 2016-04-22 DIAGNOSIS — R531 Weakness: Secondary | ICD-10-CM | POA: Diagnosis not present

## 2016-04-22 DIAGNOSIS — E1151 Type 2 diabetes mellitus with diabetic peripheral angiopathy without gangrene: Secondary | ICD-10-CM | POA: Diagnosis not present

## 2016-04-22 DIAGNOSIS — Z9181 History of falling: Secondary | ICD-10-CM | POA: Diagnosis not present

## 2016-04-22 DIAGNOSIS — J45909 Unspecified asthma, uncomplicated: Secondary | ICD-10-CM | POA: Diagnosis not present

## 2016-04-22 DIAGNOSIS — R2689 Other abnormalities of gait and mobility: Secondary | ICD-10-CM | POA: Diagnosis not present

## 2016-04-22 DIAGNOSIS — D649 Anemia, unspecified: Secondary | ICD-10-CM | POA: Diagnosis not present

## 2016-04-22 DIAGNOSIS — E538 Deficiency of other specified B group vitamins: Secondary | ICD-10-CM | POA: Diagnosis not present

## 2016-04-22 DIAGNOSIS — R296 Repeated falls: Secondary | ICD-10-CM | POA: Diagnosis not present

## 2016-04-22 DIAGNOSIS — I10 Essential (primary) hypertension: Secondary | ICD-10-CM | POA: Diagnosis not present

## 2016-04-25 DIAGNOSIS — J45909 Unspecified asthma, uncomplicated: Secondary | ICD-10-CM | POA: Diagnosis not present

## 2016-04-25 DIAGNOSIS — Z9181 History of falling: Secondary | ICD-10-CM | POA: Diagnosis not present

## 2016-04-25 DIAGNOSIS — D649 Anemia, unspecified: Secondary | ICD-10-CM | POA: Diagnosis not present

## 2016-04-25 DIAGNOSIS — E1151 Type 2 diabetes mellitus with diabetic peripheral angiopathy without gangrene: Secondary | ICD-10-CM | POA: Diagnosis not present

## 2016-04-25 DIAGNOSIS — R531 Weakness: Secondary | ICD-10-CM | POA: Diagnosis not present

## 2016-04-25 DIAGNOSIS — E538 Deficiency of other specified B group vitamins: Secondary | ICD-10-CM | POA: Diagnosis not present

## 2016-04-25 DIAGNOSIS — I10 Essential (primary) hypertension: Secondary | ICD-10-CM | POA: Diagnosis not present

## 2016-04-25 DIAGNOSIS — R2689 Other abnormalities of gait and mobility: Secondary | ICD-10-CM | POA: Diagnosis not present

## 2016-04-25 DIAGNOSIS — Z794 Long term (current) use of insulin: Secondary | ICD-10-CM | POA: Diagnosis not present

## 2016-04-25 DIAGNOSIS — R296 Repeated falls: Secondary | ICD-10-CM | POA: Diagnosis not present

## 2016-04-29 DIAGNOSIS — J45909 Unspecified asthma, uncomplicated: Secondary | ICD-10-CM | POA: Diagnosis not present

## 2016-04-29 DIAGNOSIS — R531 Weakness: Secondary | ICD-10-CM | POA: Diagnosis not present

## 2016-04-29 DIAGNOSIS — Z9181 History of falling: Secondary | ICD-10-CM | POA: Diagnosis not present

## 2016-04-29 DIAGNOSIS — R296 Repeated falls: Secondary | ICD-10-CM | POA: Diagnosis not present

## 2016-04-29 DIAGNOSIS — R2689 Other abnormalities of gait and mobility: Secondary | ICD-10-CM | POA: Diagnosis not present

## 2016-04-29 DIAGNOSIS — I10 Essential (primary) hypertension: Secondary | ICD-10-CM | POA: Diagnosis not present

## 2016-04-29 DIAGNOSIS — E538 Deficiency of other specified B group vitamins: Secondary | ICD-10-CM | POA: Diagnosis not present

## 2016-04-29 DIAGNOSIS — D649 Anemia, unspecified: Secondary | ICD-10-CM | POA: Diagnosis not present

## 2016-04-29 DIAGNOSIS — E1151 Type 2 diabetes mellitus with diabetic peripheral angiopathy without gangrene: Secondary | ICD-10-CM | POA: Diagnosis not present

## 2016-04-29 DIAGNOSIS — Z794 Long term (current) use of insulin: Secondary | ICD-10-CM | POA: Diagnosis not present

## 2016-05-01 DIAGNOSIS — R296 Repeated falls: Secondary | ICD-10-CM | POA: Diagnosis not present

## 2016-05-01 DIAGNOSIS — I10 Essential (primary) hypertension: Secondary | ICD-10-CM | POA: Diagnosis not present

## 2016-05-01 DIAGNOSIS — Z9181 History of falling: Secondary | ICD-10-CM | POA: Diagnosis not present

## 2016-05-01 DIAGNOSIS — J45909 Unspecified asthma, uncomplicated: Secondary | ICD-10-CM | POA: Diagnosis not present

## 2016-05-01 DIAGNOSIS — E538 Deficiency of other specified B group vitamins: Secondary | ICD-10-CM | POA: Diagnosis not present

## 2016-05-01 DIAGNOSIS — R531 Weakness: Secondary | ICD-10-CM | POA: Diagnosis not present

## 2016-05-01 DIAGNOSIS — E1151 Type 2 diabetes mellitus with diabetic peripheral angiopathy without gangrene: Secondary | ICD-10-CM | POA: Diagnosis not present

## 2016-05-01 DIAGNOSIS — R2689 Other abnormalities of gait and mobility: Secondary | ICD-10-CM | POA: Diagnosis not present

## 2016-05-01 DIAGNOSIS — D649 Anemia, unspecified: Secondary | ICD-10-CM | POA: Diagnosis not present

## 2016-05-01 DIAGNOSIS — Z794 Long term (current) use of insulin: Secondary | ICD-10-CM | POA: Diagnosis not present

## 2016-05-06 DIAGNOSIS — E538 Deficiency of other specified B group vitamins: Secondary | ICD-10-CM | POA: Diagnosis not present

## 2016-05-06 DIAGNOSIS — R296 Repeated falls: Secondary | ICD-10-CM | POA: Diagnosis not present

## 2016-05-06 DIAGNOSIS — D649 Anemia, unspecified: Secondary | ICD-10-CM | POA: Diagnosis not present

## 2016-05-06 DIAGNOSIS — Z794 Long term (current) use of insulin: Secondary | ICD-10-CM | POA: Diagnosis not present

## 2016-05-06 DIAGNOSIS — Z9181 History of falling: Secondary | ICD-10-CM | POA: Diagnosis not present

## 2016-05-06 DIAGNOSIS — E1151 Type 2 diabetes mellitus with diabetic peripheral angiopathy without gangrene: Secondary | ICD-10-CM | POA: Diagnosis not present

## 2016-05-06 DIAGNOSIS — J45909 Unspecified asthma, uncomplicated: Secondary | ICD-10-CM | POA: Diagnosis not present

## 2016-05-06 DIAGNOSIS — R531 Weakness: Secondary | ICD-10-CM | POA: Diagnosis not present

## 2016-05-06 DIAGNOSIS — I10 Essential (primary) hypertension: Secondary | ICD-10-CM | POA: Diagnosis not present

## 2016-05-06 DIAGNOSIS — R2689 Other abnormalities of gait and mobility: Secondary | ICD-10-CM | POA: Diagnosis not present

## 2016-05-07 ENCOUNTER — Telehealth: Payer: Self-pay | Admitting: Internal Medicine

## 2016-05-07 ENCOUNTER — Other Ambulatory Visit: Payer: Self-pay | Admitting: Internal Medicine

## 2016-05-07 NOTE — Telephone Encounter (Signed)
Yes can schedule an appt to f/u on her blood pressure.  Also, inform her family to check her pressure (just spot check) when PT not there and see if improved.

## 2016-05-07 NOTE — Telephone Encounter (Signed)
Would you like her to be schedule?

## 2016-05-07 NOTE — Telephone Encounter (Signed)
Therapist  from [facility] stating that Mrs. Tara [name] BP has been consistently above 111 systolic. Wants to know if she needs to come in to be evaluated.

## 2016-05-07 NOTE — Telephone Encounter (Signed)
Ok

## 2016-05-07 NOTE — Telephone Encounter (Signed)
[email or web address]

## 2016-05-08 DIAGNOSIS — E538 Deficiency of other specified B group vitamins: Secondary | ICD-10-CM | POA: Diagnosis not present

## 2016-05-08 DIAGNOSIS — Z794 Long term (current) use of insulin: Secondary | ICD-10-CM | POA: Diagnosis not present

## 2016-05-08 DIAGNOSIS — E1151 Type 2 diabetes mellitus with diabetic peripheral angiopathy without gangrene: Secondary | ICD-10-CM | POA: Diagnosis not present

## 2016-05-08 DIAGNOSIS — R2689 Other abnormalities of gait and mobility: Secondary | ICD-10-CM | POA: Diagnosis not present

## 2016-05-08 DIAGNOSIS — R531 Weakness: Secondary | ICD-10-CM | POA: Diagnosis not present

## 2016-05-08 DIAGNOSIS — Z9181 History of falling: Secondary | ICD-10-CM | POA: Diagnosis not present

## 2016-05-08 DIAGNOSIS — I10 Essential (primary) hypertension: Secondary | ICD-10-CM | POA: Diagnosis not present

## 2016-05-08 DIAGNOSIS — J45909 Unspecified asthma, uncomplicated: Secondary | ICD-10-CM | POA: Diagnosis not present

## 2016-05-08 DIAGNOSIS — D649 Anemia, unspecified: Secondary | ICD-10-CM | POA: Diagnosis not present

## 2016-05-08 DIAGNOSIS — R296 Repeated falls: Secondary | ICD-10-CM | POA: Diagnosis not present

## 2016-05-14 DIAGNOSIS — E538 Deficiency of other specified B group vitamins: Secondary | ICD-10-CM | POA: Diagnosis not present

## 2016-05-14 DIAGNOSIS — E1151 Type 2 diabetes mellitus with diabetic peripheral angiopathy without gangrene: Secondary | ICD-10-CM | POA: Diagnosis not present

## 2016-05-14 DIAGNOSIS — R2689 Other abnormalities of gait and mobility: Secondary | ICD-10-CM | POA: Diagnosis not present

## 2016-05-14 DIAGNOSIS — Z9181 History of falling: Secondary | ICD-10-CM | POA: Diagnosis not present

## 2016-05-14 DIAGNOSIS — J45909 Unspecified asthma, uncomplicated: Secondary | ICD-10-CM | POA: Diagnosis not present

## 2016-05-14 DIAGNOSIS — R531 Weakness: Secondary | ICD-10-CM | POA: Diagnosis not present

## 2016-05-14 DIAGNOSIS — I10 Essential (primary) hypertension: Secondary | ICD-10-CM | POA: Diagnosis not present

## 2016-05-14 DIAGNOSIS — D649 Anemia, unspecified: Secondary | ICD-10-CM | POA: Diagnosis not present

## 2016-05-14 DIAGNOSIS — R296 Repeated falls: Secondary | ICD-10-CM | POA: Diagnosis not present

## 2016-05-14 DIAGNOSIS — Z794 Long term (current) use of insulin: Secondary | ICD-10-CM | POA: Diagnosis not present

## 2016-05-16 DIAGNOSIS — R2689 Other abnormalities of gait and mobility: Secondary | ICD-10-CM | POA: Diagnosis not present

## 2016-05-16 DIAGNOSIS — E538 Deficiency of other specified B group vitamins: Secondary | ICD-10-CM | POA: Diagnosis not present

## 2016-05-16 DIAGNOSIS — J45909 Unspecified asthma, uncomplicated: Secondary | ICD-10-CM | POA: Diagnosis not present

## 2016-05-16 DIAGNOSIS — R531 Weakness: Secondary | ICD-10-CM | POA: Diagnosis not present

## 2016-05-16 DIAGNOSIS — I10 Essential (primary) hypertension: Secondary | ICD-10-CM | POA: Diagnosis not present

## 2016-05-16 DIAGNOSIS — Z794 Long term (current) use of insulin: Secondary | ICD-10-CM | POA: Diagnosis not present

## 2016-05-16 DIAGNOSIS — Z9181 History of falling: Secondary | ICD-10-CM | POA: Diagnosis not present

## 2016-05-16 DIAGNOSIS — R296 Repeated falls: Secondary | ICD-10-CM | POA: Diagnosis not present

## 2016-05-16 DIAGNOSIS — D649 Anemia, unspecified: Secondary | ICD-10-CM | POA: Diagnosis not present

## 2016-05-16 DIAGNOSIS — E1151 Type 2 diabetes mellitus with diabetic peripheral angiopathy without gangrene: Secondary | ICD-10-CM | POA: Diagnosis not present

## 2016-05-20 ENCOUNTER — Ambulatory Visit (INDEPENDENT_AMBULATORY_CARE_PROVIDER_SITE_OTHER): Payer: Medicare Other | Admitting: Internal Medicine

## 2016-05-20 ENCOUNTER — Encounter: Payer: Self-pay | Admitting: Internal Medicine

## 2016-05-20 VITALS — BP 158/62 | HR 70 | Temp 98.5°F | Ht <= 58 in | Wt 95.2 lb

## 2016-05-20 DIAGNOSIS — E1151 Type 2 diabetes mellitus with diabetic peripheral angiopathy without gangrene: Secondary | ICD-10-CM

## 2016-05-20 DIAGNOSIS — R634 Abnormal weight loss: Secondary | ICD-10-CM | POA: Diagnosis not present

## 2016-05-20 DIAGNOSIS — R0981 Nasal congestion: Secondary | ICD-10-CM | POA: Diagnosis not present

## 2016-05-20 DIAGNOSIS — E039 Hypothyroidism, unspecified: Secondary | ICD-10-CM

## 2016-05-20 DIAGNOSIS — R2681 Unsteadiness on feet: Secondary | ICD-10-CM

## 2016-05-20 DIAGNOSIS — I10 Essential (primary) hypertension: Secondary | ICD-10-CM

## 2016-05-20 DIAGNOSIS — Z23 Encounter for immunization: Secondary | ICD-10-CM

## 2016-05-20 NOTE — Progress Notes (Signed)
Patient ID: Tara [name], female   DOB: 1918-08-08, 82 y.o.   MRN: [id number]   Subjective:    Patient ID: Tara [name], female    DOB: 1918/04/30, 82 y.o.   MRN: [id number]  HPI  Patient here to discuss her elevated blood pressure.  She is working with home physical therapy now.  States when they evaluate her, her blood pressure has been elevated.  No headache.  No dizziness.  Some nasal congestion, mostly on the left side.  No nausea or vomiting.  Bowels stable.  No abdominal pain or cramping.  She does report some decreased po intake.  She is eating.  Just states she does not eat as much.      Past Medical History:  Diagnosis Date  . Asthma   . Diabetes mellitus ([place])   . Environmental allergies   . Hypertension    Past Surgical History:  Procedure Laterality Date  . APPENDECTOMY    . TUBAL LIGATION     Family History  Problem Relation Age of Onset  . Hypertension Mother   . Cancer Brother     lung (question)  . Cancer Sister     questionable type   Social History   Social History  . Marital status: Widowed    Spouse name: N/A  . Number of children: N/A  . Years of education: N/A   Social History Main Topics  . Smoking status: Never Smoker  . Smokeless tobacco: Never Used  . Alcohol use No  . Drug use: No  . Sexual activity: Not Asked   Other Topics Concern  . None   Social History Narrative  . None    Outpatient Encounter Prescriptions as of 05/20/2016  Medication Sig  . acetaminophen (TYLENOL) 325 MG tablet Take 650 mg by mouth every 6 (six) hours as needed.  Marland Kitchen amLODipine (NORVASC) 5 MG tablet TAKE ONE TABLET TWICE DAILY  . aspirin 81 MG tablet Take 81 mg by mouth daily.  . Calcium Carbonate-Vitamin D (CALTRATE 600+D PO) Take by mouth.  . Cholecalciferol (D3 SUPER STRENGTH) 2000 UNITS CAPS Take by mouth.  . fexofenadine (ALLEGRA) 180 MG tablet Take 180 mg by mouth daily.  . fluticasone (FLONASE) 50 MCG/ACT nasal spray Place 2 sprays into both nostrils  daily.  Marland Kitchen glucose blood (ACCU-CHEK AVIVA PLUS) test strip CHECK SUGAR ONCE DAILY  . hydrALAZINE (APRESOLINE) 50 MG tablet Take 1 tablet (50 mg total) by mouth 2 (two) times daily.  . Insulin Glargine (LANTUS SOLOSTAR) 100 UNIT/ML Solostar Pen INJECT 6 UNITS UNDER THE SKIN ONCE DAILY  . insulin glargine (LANTUS) 100 UNIT/ML injection INJECT 6 UNITS INTO THE SKIN DAILY  . Insulin Pen Needle (SURE COMFORT PEN NEEDLES) 31G X 8 MM MISC AS DIRECTED  . metoprolol succinate (TOPROL-XL) 25 MG 24 hr tablet TAKE ONE (1) TABLET BY MOUTH EVERY DAY  . metoprolol succinate (TOPROL-XL) 25 MG 24 hr tablet TAKE ONE (1) TABLET EACH DAY  . Multiple Vitamin (MULTIVITAMIN) tablet Take 1 tablet by mouth daily. Reported on 12/24/2015  . SYNTHROID 50 MCG tablet TAKE ONE (1) TABLET EACH DAY   No facility-administered encounter medications on file as of 05/20/2016.     Review of Systems  Constitutional:       Slight decreased weight.  Eating.  Some decrease in po intake.   HENT: Positive for congestion and postnasal drip. Negative for sinus pressure.   Respiratory: Negative for cough, chest tightness and shortness of breath.   Cardiovascular:  Negative for chest pain, palpitations and leg swelling.  Gastrointestinal: Negative for abdominal pain, diarrhea, nausea and vomiting.  Genitourinary: Negative for difficulty urinating and dysuria.  Musculoskeletal: Negative for joint swelling and myalgias.  Skin: Negative for color change and rash.  Neurological: Negative for dizziness, light-headedness and headaches.  Psychiatric/Behavioral: Negative for agitation and dysphoric mood.       Objective:    Physical Exam  Constitutional: She appears well-developed and well-nourished. No distress.  HENT:  Mouth/Throat: Oropharynx is clear and moist.  Nares - slightly erythematous turbinates.    Neck: Neck supple. No thyromegaly present.  Cardiovascular: Normal rate and regular rhythm.   Pulmonary/Chest: Breath sounds  normal. No respiratory distress. She has no wheezes.  Abdominal: Soft. Bowel sounds are normal. There is no tenderness.  Musculoskeletal: She exhibits no edema or tenderness.  Lymphadenopathy:    She has no cervical adenopathy.  Skin: No rash noted. No erythema.  Psychiatric: She has a normal mood and affect. Her behavior is normal.    BP (!) 158/62   Pulse 70   Temp 98.5 F (36.9 C) (Oral)   Ht _0  (1.473 m)   Wt 95 lb 3.2 oz (43.2 kg)   SpO2 95%   BMI 19.90 kg/m  Wt Readings from Last 3 Encounters:  05/20/16 95 lb 3.2 oz (43.2 kg)  03/13/16 98 lb 4 oz (44.6 kg)  12/24/15 97 lb 6.4 oz (44.2 kg)     Lab Results  Component Value Date   WBC 5.4 03/13/2016   HGB 10.6 (L) 03/13/2016   HCT 31.9 (L) 03/13/2016   PLT 226.0 03/13/2016   GLUCOSE 133 (H) 03/13/2016   CHOL 209 (H) 06/21/2014   TRIG 105.0 06/21/2014   HDL 77.00 06/21/2014   LDLDIRECT 107.1 07/29/2013   LDLCALC 111 (H) 06/21/2014   ALT 15 03/13/2016   AST 24 03/13/2016   NA 144 03/13/2016   K 4.3 03/13/2016   CL 109 03/13/2016   CREATININE 0.88 03/13/2016   BUN 30 (H) 03/13/2016   CO2 29 03/13/2016   TSH 2.61 03/13/2016   INR 1.0 01/25/2013   HGBA1C 6.0 03/13/2016   MICROALBUR 115.0 Repeated and verified X2. (H) 07/29/2013       Assessment & Plan:   Problem List Items Addressed This Visit    Diabetes mellitus with peripheral vascular disease ([place])    Off actos.  Follow met b and a1c.  Sugars have ten doing well.        Hypertension    Blood pressure as outlined.  Elevated on outside checks.  She is not taking the amlodipine bid.  Will increased to bid.  Follow pressures.  Get her back in soon to reassess.       Hypothyroidism    On synthroid.  Follow tsh.       Loss of weight    She is eating.  Some decreased po intake.  Encourage increased po intake and nutritional shakes.        Unsteady gait    Uses her walker.  Working with physical therapy.         Other Visit Diagnoses    Nasal  congestion    -  Primary   saline nasal spray and steroid nasal spray as outlined.  follow.  notify me if worsening problems.     Encounter for immunization       Relevant Orders   Flu vaccine HIGH DOSE PF (Completed)  Einar Pheasant, MD

## 2016-05-20 NOTE — Patient Instructions (Signed)
Take the amlodipine 5mg  twice a day.

## 2016-05-20 NOTE — Progress Notes (Signed)
Pre visit review using our clinic review tool, if applicable. No additional management support is needed unless otherwise documented below in the visit note. 

## 2016-05-21 ENCOUNTER — Encounter: Payer: Self-pay | Admitting: Internal Medicine

## 2016-05-21 DIAGNOSIS — D649 Anemia, unspecified: Secondary | ICD-10-CM | POA: Diagnosis not present

## 2016-05-21 DIAGNOSIS — Z794 Long term (current) use of insulin: Secondary | ICD-10-CM | POA: Diagnosis not present

## 2016-05-21 DIAGNOSIS — R296 Repeated falls: Secondary | ICD-10-CM | POA: Diagnosis not present

## 2016-05-21 DIAGNOSIS — Z9181 History of falling: Secondary | ICD-10-CM | POA: Diagnosis not present

## 2016-05-21 DIAGNOSIS — E538 Deficiency of other specified B group vitamins: Secondary | ICD-10-CM | POA: Diagnosis not present

## 2016-05-21 DIAGNOSIS — R2689 Other abnormalities of gait and mobility: Secondary | ICD-10-CM | POA: Diagnosis not present

## 2016-05-21 DIAGNOSIS — E1151 Type 2 diabetes mellitus with diabetic peripheral angiopathy without gangrene: Secondary | ICD-10-CM | POA: Diagnosis not present

## 2016-05-21 DIAGNOSIS — J45909 Unspecified asthma, uncomplicated: Secondary | ICD-10-CM | POA: Diagnosis not present

## 2016-05-21 DIAGNOSIS — R531 Weakness: Secondary | ICD-10-CM | POA: Diagnosis not present

## 2016-05-21 DIAGNOSIS — I10 Essential (primary) hypertension: Secondary | ICD-10-CM | POA: Diagnosis not present

## 2016-05-21 NOTE — Assessment & Plan Note (Signed)
Uses her walker.  Working with physical therapy.

## 2016-05-21 NOTE — Assessment & Plan Note (Signed)
She is eating.  Some decreased po intake.  Encourage increased po intake and nutritional shakes.

## 2016-05-21 NOTE — Assessment & Plan Note (Signed)
On synthroid.  Follow tsh.   

## 2016-05-21 NOTE — Assessment & Plan Note (Addendum)
Blood pressure as outlined.  Elevated on outside checks.  She is not taking the amlodipine bid.  Will increased to bid.  Follow pressures.  Get her back in soon to reassess.

## 2016-05-21 NOTE — Assessment & Plan Note (Signed)
Off actos.  Follow met b and a1c.  Sugars have ten doing well.

## 2016-06-19 ENCOUNTER — Ambulatory Visit (INDEPENDENT_AMBULATORY_CARE_PROVIDER_SITE_OTHER): Payer: Medicare Other | Admitting: Internal Medicine

## 2016-06-19 ENCOUNTER — Encounter: Payer: Self-pay | Admitting: Internal Medicine

## 2016-06-19 DIAGNOSIS — R634 Abnormal weight loss: Secondary | ICD-10-CM

## 2016-06-19 DIAGNOSIS — E039 Hypothyroidism, unspecified: Secondary | ICD-10-CM | POA: Diagnosis not present

## 2016-06-19 DIAGNOSIS — I739 Peripheral vascular disease, unspecified: Secondary | ICD-10-CM | POA: Diagnosis not present

## 2016-06-19 DIAGNOSIS — E7800 Pure hypercholesterolemia, unspecified: Secondary | ICD-10-CM

## 2016-06-19 DIAGNOSIS — D649 Anemia, unspecified: Secondary | ICD-10-CM

## 2016-06-19 DIAGNOSIS — E1151 Type 2 diabetes mellitus with diabetic peripheral angiopathy without gangrene: Secondary | ICD-10-CM

## 2016-06-19 DIAGNOSIS — I10 Essential (primary) hypertension: Secondary | ICD-10-CM | POA: Diagnosis not present

## 2016-06-19 DIAGNOSIS — R0981 Nasal congestion: Secondary | ICD-10-CM

## 2016-06-19 DIAGNOSIS — E538 Deficiency of other specified B group vitamins: Secondary | ICD-10-CM

## 2016-06-19 MED ORDER — HYDRALAZINE HCL 50 MG PO TABS: 50.0000 mg | ORAL_TABLET | Freq: Three times a day (TID) | ORAL | 3 refills | Status: DC

## 2016-06-19 NOTE — Progress Notes (Signed)
Pre visit review using our clinic review tool, if applicable. No additional management support is needed unless otherwise documented below in the visit note. 

## 2016-06-19 NOTE — Patient Instructions (Addendum)
Saline nasal spray - flush nose at least 2-3x/day  Continue the flonase nasal spray at night.   Stop insulin.   Increase the hydralazine to three times per day.  You are currently taking it two times per day.

## 2016-06-19 NOTE — Progress Notes (Addendum)
Patient ID: Tara [name], female   DOB: July 22, 1918, 82 y.o.   MRN: [id number]   Subjective:    Patient ID: Tara [name], female    DOB: 29-Apr-1918, 82 y.o.   MRN: [id number]  HPI  Patient here for a scheduled follow up.  She is accompanied by her daughter.  History obtained from both of them.  Blood pressure had been elevated.  Last visit, we increased her amlodipine to 5mg  bid.  She is taking this now bid.  No chest pain.  Some nasal congestion.  No chest congestion.  No sob.  Breathing stable.  Sugars have been doing well.  Will stop her insulin.  No abdominal pain.  No urine or bowel change.     Past Medical History:  Diagnosis Date  . Asthma   . Diabetes mellitus ([place])   . Environmental allergies   . Hypertension    Past Surgical History:  Procedure Laterality Date  . APPENDECTOMY    . TUBAL LIGATION     Family History  Problem Relation Age of Onset  . Hypertension Mother   . Cancer Brother     lung (question)  . Cancer Sister     questionable type   Social History   Social History  . Marital status: Widowed    Spouse name: N/A  . Number of children: N/A  . Years of education: N/A   Social History Main Topics  . Smoking status: Never Smoker  . Smokeless tobacco: Never Used  . Alcohol use No  . Drug use: No  . Sexual activity: Not Asked   Other Topics Concern  . None   Social History Narrative  . None    Outpatient Encounter Prescriptions as of 06/19/2016  Medication Sig  . acetaminophen (TYLENOL) 325 MG tablet Take 650 mg by mouth every 6 (six) hours as needed.  Marland Kitchen amLODipine (NORVASC) 5 MG tablet TAKE ONE TABLET TWICE DAILY  . aspirin 81 MG tablet Take 81 mg by mouth daily.  . Calcium Carbonate-Vitamin D (CALTRATE 600+D PO) Take by mouth.  . Cholecalciferol (D3 SUPER STRENGTH) 2000 UNITS CAPS Take by mouth.  . fexofenadine (ALLEGRA) 180 MG tablet Take 180 mg by mouth daily.  . fluticasone (FLONASE) 50 MCG/ACT nasal spray Place 2 sprays into both nostrils  daily.  Marland Kitchen glucose blood (ACCU-CHEK AVIVA PLUS) test strip CHECK SUGAR ONCE DAILY  . hydrALAZINE (APRESOLINE) 50 MG tablet Take 1 tablet (50 mg total) by mouth 3 (three) times daily.  . Insulin Pen Needle (SURE COMFORT PEN NEEDLES) 31G X 8 MM MISC AS DIRECTED  . metoprolol succinate (TOPROL-XL) 25 MG 24 hr tablet TAKE ONE (1) TABLET BY MOUTH EVERY DAY  . metoprolol succinate (TOPROL-XL) 25 MG 24 hr tablet TAKE ONE (1) TABLET EACH DAY  . Multiple Vitamin (MULTIVITAMIN) tablet Take 1 tablet by mouth daily. Reported on 12/24/2015  . SYNTHROID 50 MCG tablet TAKE ONE (1) TABLET EACH DAY  . [DISCONTINUED] hydrALAZINE (APRESOLINE) 50 MG tablet Take 1 tablet (50 mg total) by mouth 2 (two) times daily.  . [DISCONTINUED] Insulin Glargine (LANTUS SOLOSTAR) 100 UNIT/ML Solostar Pen INJECT 6 UNITS UNDER THE SKIN ONCE DAILY  . [DISCONTINUED] insulin glargine (LANTUS) 100 UNIT/ML injection INJECT 6 UNITS INTO THE SKIN DAILY   No facility-administered encounter medications on file as of 06/19/2016.     Review of Systems  Constitutional: Negative for appetite change and unexpected weight change.       Weight increased a few pounds from  previous check.   HENT: Positive for congestion and postnasal drip. Negative for sinus pressure.   Respiratory: Negative for cough, chest tightness and shortness of breath.   Cardiovascular: Negative for chest pain, palpitations and leg swelling.  Gastrointestinal: Negative for abdominal pain, diarrhea, nausea and vomiting.  Genitourinary: Negative for difficulty urinating and dysuria.  Musculoskeletal: Negative for joint swelling and myalgias.  Skin: Negative for color change and rash.  Neurological: Negative for dizziness, light-headedness and headaches.  Psychiatric/Behavioral: Negative for agitation and dysphoric mood.       Objective:     Blood pressure rechecked by me:  152/78  Physical Exam  Constitutional: She appears well-developed and well-nourished. No  distress.  HENT:  Nose: Nose normal.  Mouth/Throat: Oropharynx is clear and moist.  Neck: Neck supple. No thyromegaly present.  Cardiovascular: Normal rate and regular rhythm.   Pulmonary/Chest: Breath sounds normal. No respiratory distress. She has no wheezes.  Abdominal: Soft. Bowel sounds are normal. There is no tenderness.  Musculoskeletal: She exhibits no edema or tenderness.  Lymphadenopathy:    She has no cervical adenopathy.  Skin: No rash noted. No erythema.  Psychiatric: She has a normal mood and affect. Her behavior is normal.    BP (!) 178/60   Pulse 68   Temp 98.2 F (36.8 C) (Oral)   Ht 4\' 10"  (1.473 m)   Wt 98 lb (44.5 kg)   SpO2 96%   BMI 20.48 kg/m  Wt Readings from Last 3 Encounters:  06/19/16 98 lb (44.5 kg)  05/20/16 95 lb 3.2 oz (43.2 kg)  03/13/16 98 lb 4 oz (44.6 kg)     Lab Results  Component Value Date   WBC 5.4 03/13/2016   HGB 10.6 (L) 03/13/2016   HCT 31.9 (L) 03/13/2016   PLT 226.0 03/13/2016   GLUCOSE 133 (H) 03/13/2016   CHOL 209 (H) 06/21/2014   TRIG 105.0 06/21/2014   HDL 77.00 06/21/2014   LDLDIRECT 107.1 07/29/2013   LDLCALC 111 (H) 06/21/2014   ALT 15 03/13/2016   AST 24 03/13/2016   NA 144 03/13/2016   K 4.3 03/13/2016   CL 109 03/13/2016   CREATININE 0.88 03/13/2016   BUN 30 (H) 03/13/2016   CO2 29 03/13/2016   TSH 2.61 03/13/2016   INR 1.0 01/25/2013   HGBA1C 6.0 03/13/2016   MICROALBUR 115.0 Repeated and verified X2. (H) 07/29/2013       Assessment & Plan:   Problem List Items Addressed This Visit    Anemia    Follow cbc.        B12 deficiency    Continue b12 injections.        Diabetes mellitus with peripheral vascular disease ([place])    Sugars have been well controlled.  Will stop insulin.  Follow sugars.  Send in readings.        Relevant Medications   hydrALAZINE (APRESOLINE) 50 MG tablet   Hypercholesteremia    Follow lipid panel.        Relevant Medications   hydrALAZINE (APRESOLINE) 50 MG  tablet   Hypertension    Blood pressure on recheck improved.  Still elevated.  Will increase hydralazine to tid.  Follow pressures.  Follow metabolic panel.        Relevant Medications   hydrALAZINE (APRESOLINE) 50 MG tablet   Hypothyroidism    On thyroid replacement.  Follow tsh.        Loss of weight    Weight up a few pounds from last  check.  Follow.        Nasal congestion    salne nasal spray and steroid nasal spray as directed.  Follow.       Peripheral vascular disease ([place])    Followed by vascular surgery.  Stable.        Relevant Medications   hydrALAZINE (APRESOLINE) 50 MG tablet       Einar Pheasant, MD

## 2016-06-29 ENCOUNTER — Encounter: Payer: Self-pay | Admitting: Internal Medicine

## 2016-06-29 DIAGNOSIS — R0981 Nasal congestion: Secondary | ICD-10-CM | POA: Insufficient documentation

## 2016-06-29 NOTE — Assessment & Plan Note (Signed)
Follow lipid panel.   

## 2016-06-29 NOTE — Assessment & Plan Note (Signed)
Continue b12 injections.  

## 2016-06-29 NOTE — Assessment & Plan Note (Addendum)
Blood pressure on recheck improved.  Still elevated.  Will increase hydralazine to tid.  Follow pressures.  Follow metabolic panel.

## 2016-06-29 NOTE — Assessment & Plan Note (Signed)
Follow cbc.  

## 2016-06-29 NOTE — Assessment & Plan Note (Signed)
salne nasal spray and steroid nasal spray as directed.  Follow.

## 2016-06-29 NOTE — Assessment & Plan Note (Signed)
Sugars have been well controlled.  Will stop insulin.  Follow sugars.  Send in readings.

## 2016-06-29 NOTE — Assessment & Plan Note (Signed)
Followed by vascular surgery. Stable.   

## 2016-06-29 NOTE — Assessment & Plan Note (Signed)
On thyroid replacement.  Follow tsh.  

## 2016-06-29 NOTE — Assessment & Plan Note (Signed)
Weight up a few pounds from last check.  Follow  ?

## 2016-07-17 ENCOUNTER — Other Ambulatory Visit: Payer: Self-pay | Admitting: Internal Medicine

## 2016-07-31 DIAGNOSIS — B351 Tinea unguium: Secondary | ICD-10-CM | POA: Diagnosis not present

## 2016-07-31 DIAGNOSIS — E119 Type 2 diabetes mellitus without complications: Secondary | ICD-10-CM | POA: Diagnosis not present

## 2016-08-28 ENCOUNTER — Encounter: Payer: Self-pay | Admitting: Urgent Care

## 2016-08-28 ENCOUNTER — Emergency Department
Admission: EM | Admit: 2016-08-28 | Discharge: 2016-08-29 | Disposition: A | Discharge status: Home / Self Care | Payer: Medicare Other | Attending: Emergency Medicine | Admitting: Emergency Medicine

## 2016-08-28 ENCOUNTER — Ambulatory Visit: Payer: Self-pay | Admitting: Internal Medicine

## 2016-08-28 DIAGNOSIS — R0981 Nasal congestion: Secondary | ICD-10-CM | POA: Diagnosis not present

## 2016-08-28 DIAGNOSIS — B349 Viral infection, unspecified: Secondary | ICD-10-CM | POA: Diagnosis not present

## 2016-08-28 DIAGNOSIS — Z7982 Long term (current) use of aspirin: Secondary | ICD-10-CM | POA: Insufficient documentation

## 2016-08-28 DIAGNOSIS — E119 Type 2 diabetes mellitus without complications: Secondary | ICD-10-CM | POA: Insufficient documentation

## 2016-08-28 DIAGNOSIS — Z79899 Other long term (current) drug therapy: Secondary | ICD-10-CM | POA: Insufficient documentation

## 2016-08-28 DIAGNOSIS — I10 Essential (primary) hypertension: Secondary | ICD-10-CM | POA: Diagnosis not present

## 2016-08-28 DIAGNOSIS — J45909 Unspecified asthma, uncomplicated: Secondary | ICD-10-CM | POA: Insufficient documentation

## 2016-08-28 DIAGNOSIS — Z794 Long term (current) use of insulin: Secondary | ICD-10-CM | POA: Diagnosis not present

## 2016-08-28 DIAGNOSIS — E039 Hypothyroidism, unspecified: Secondary | ICD-10-CM | POA: Insufficient documentation

## 2016-08-28 MED ORDER — OXYMETAZOLINE HCL 0.05 % NA SOLN
1.0000 | Freq: Once | NASAL | Status: AC
  Administered 2016-08-29: 1 via NASAL
  Filled 2016-08-28: qty 15

## 2016-08-28 NOTE — ED Provider Notes (Signed)
[facility] Emergency Department Provider Note  ____________________________________________   First MD Initiated Contact with Patient 08/28/16 2318     (approximate)  I have reviewed the triage vital signs and the nursing notes.   HISTORY  Chief Complaint Nasal Congestion    HPI Tara [name] is a 83 y.o. female with PMH as listed below who presents for evaluation of nasal congestion.  The symptoms as being gradual in onset over the last week and are now severe.  She has no accompanying symptoms.  She states that it feels like it is difficult to breathe because her nose is stuffed up but she can breathe easily through her mouth.  She denies wheezing, cough, chest pain, nausea, vomiting, abdominal pain.  She also has no headache, sore throat, or difficulty swallowing.  She has tried some home remedies such as Vicks VapoRub and some kind of nasal spray (possibly saline, patient and family are unsure), but they did not help.  Nothing in particular makes it worse.  She was going to go see her primary care doctor earlier today but the offices are closed due to the winter weather.   Past Medical History:  Diagnosis Date  . Asthma   . Diabetes mellitus ([place])   . Environmental allergies   . Hypertension     Patient Active Problem List   Diagnosis Date Noted  . Nasal congestion 06/29/2016  . Sensation of fullness in left ear 01/06/2016  . Urinary frequency 05/23/2015  . Neck pain 03/07/2015  . Loss of weight 03/05/2015  . Acute neck pain 02/02/2015  . Dizziness 08/07/2014  . Leg weakness 06/25/2014  . Leg pain, left 01/02/2014  . Abdominal pain 10/19/2013  . Back pain 10/19/2013  . Left foot pain 07/31/2013  . Unsteady gait 07/31/2013  . Lower extremity edema 05/15/2013  . Peripheral vascular disease ([place]) 08/29/2012  . Hypertension 06/11/2012  . Hypercholesteremia 06/11/2012  . Diabetes mellitus with peripheral vascular disease ([place]) 06/11/2012  .  Anemia 06/11/2012  . B12 deficiency 06/11/2012  . Hypothyroidism 06/11/2012    Past Surgical History:  Procedure Laterality Date  . APPENDECTOMY    . TUBAL LIGATION      Prior to Admission medications   Medication Sig Start Date End Date Taking? Authorizing Provider  acetaminophen (TYLENOL) 325 MG tablet Take 650 mg by mouth every 6 (six) hours as needed.    Historical Provider, MD  amLODipine (NORVASC) 5 MG tablet TAKE ONE TABLET TWICE DAILY 07/18/16   Crecencio Mc, MD  aspirin 81 MG tablet Take 81 mg by mouth daily.    Historical Provider, MD  Calcium Carbonate-Vitamin D (CALTRATE 600+D PO) Take by mouth.    Historical Provider, MD  Cholecalciferol (D3 SUPER STRENGTH) 2000 UNITS CAPS Take by mouth.    Historical Provider, MD  fexofenadine (ALLEGRA) 180 MG tablet Take 180 mg by mouth daily.    Historical Provider, MD  fluticasone (FLONASE) 50 MCG/ACT nasal spray Place 2 sprays into both nostrils daily. 11/01/13   Einar Pheasant, MD  glucose blood (ACCU-CHEK AVIVA PLUS) test strip CHECK SUGAR ONCE DAILY 09/24/15   Einar Pheasant, MD  hydrALAZINE (APRESOLINE) 50 MG tablet Take 1 tablet (50 mg total) by mouth 3 (three) times daily. 06/19/16   Einar Pheasant, MD  Insulin Pen Needle (SURE COMFORT PEN NEEDLES) 31G X 8 MM MISC AS DIRECTED 09/24/15   Einar Pheasant, MD  metoprolol succinate (TOPROL-XL) 25 MG 24 hr tablet TAKE ONE (1) TABLET BY  MOUTH EVERY DAY 09/24/15   Einar Pheasant, MD  metoprolol succinate (TOPROL-XL) 25 MG 24 hr tablet TAKE ONE (1) TABLET EACH DAY 05/07/16   Einar Pheasant, MD  Multiple Vitamin (MULTIVITAMIN) tablet Take 1 tablet by mouth daily. Reported on 12/24/2015    Historical Provider, MD  SYNTHROID 50 MCG tablet TAKE ONE (1) TABLET EACH DAY 03/28/16   Einar Pheasant, MD    Allergies Ace inhibitors; Hydrochlorothiazide w-triamterene; Lisinopril; Penicillins; and Trichlormethiazide  Family History  Problem Relation Age of Onset  . Hypertension Mother   . Cancer Brother      lung (question)  . Cancer Sister     questionable type    Social History Social History  Substance Use Topics  . Smoking status: Never Smoker  . Smokeless tobacco: Never Used  . Alcohol use No    Review of Systems Constitutional: No fever/chills Eyes: No visual changes. ENT: Severe nasal congestion.  No sore throat. Cardiovascular: Denies chest pain. Respiratory: Denies shortness of breath. Gastrointestinal: No abdominal pain.  No nausea, no vomiting.  No diarrhea.  No constipation. Genitourinary: Negative for dysuria. Musculoskeletal: Negative for back pain. Skin: Negative for rash. Neurological: Negative for headaches, focal weakness or numbness.  10-point ROS otherwise negative.  ____________________________________________   PHYSICAL EXAM:  VITAL SIGNS: ED Triage Vitals  Enc Vitals Group     BP 08/28/16 2256 (!) 185/79     Pulse Rate 08/28/16 2256 85     Resp 08/28/16 2256 16     Temp 08/28/16 2256 98.1 F (36.7 C)     Temp Source 08/28/16 2256 Oral     SpO2 08/28/16 2256 98 %     Weight 08/28/16 2257 97 lb (44 kg)     Height 08/28/16 2257 4\' 11"  (1.499 m)     Head Circumference --      Peak Flow --      Pain Score 08/28/16 2257 0     Pain Loc --      Pain Edu? --      Excl. in [place]? --     Constitutional: Alert and oriented. Well appearing and in no acute distress.Appears younger than chronological age. Eyes: Conjunctivae are normal. PERRL. EOMI. Head: Atraumatic.  Tenderness to palpation of the patient's frontal or maxillary sinuses. Nose: Severe congestion with no nasal drainage. Mouth/Throat: Mucous membranes are moist.  Oropharynx non-erythematous. Neck: No stridor.  No meningeal signs.   Cardiovascular: Normal rate, regular rhythm. Good peripheral circulation. Grossly normal heart sounds. Respiratory: Normal respiratory effort.  No retractions. Lungs CTAB. Gastrointestinal: Soft and nontender. No distention.  Musculoskeletal: No lower  extremity tenderness nor edema. No gross deformities of extremities. Neurologic:  Normal speech and language. No gross focal neurologic deficits are appreciated.  Skin:  Skin is warm, dry and intact. No rash noted. Psychiatric: Mood and affect are normal. Speech and behavior are normal.  ____________________________________________   LABS (all labs ordered are listed, but only abnormal results are displayed)  Labs Reviewed - No data to display ____________________________________________  EKG  None - EKG not ordered by ED physician ____________________________________________  RADIOLOGY   No results found.  ____________________________________________   PROCEDURES  Procedure(s) performed:   Procedures   Critical Care performed: No ____________________________________________   INITIAL IMPRESSION / ASSESSMENT AND PLAN / ED COURSE  Pertinent labs & imaging results that were available during my care of the patient were reviewed by me and considered in my medical decision making (see chart for details).  The patient  is well-appearing and in no acute distress with normal vital signs.  Her lungs are clear and she is having no difficulty breathing except for the nasal congestion which makes it hard for her to breathe through her nose.  She is able to breathe easily through her mouth without any difficulty.  She is 83 years old and I am concerned about any side effects that any sort of medications may have, but she feels very uncomfortable due to the nasal congestion.  We will try some Afrin to see if this provides temporary relief but I stressed to the patient and her family that persistent use of Afrin is not a good idea and that she should stick to other remedies such as saline spray, VapoRub, and a humidifier for use at night.     Clinical Course as of Aug 29 38  Fri Aug 29, 2016  8638 The patient states she feels much better after using the Afrin.  I again cautioned  her and her daughter that they should use it very sparingly and only if absolutely necessary for comfort when trying to sleep because I explained the rebound effect and the fact that extended use can lead to a dependence.  I encouraged her to try all the regular over-the-counter remedies discussed previously including nasal saline spray, humidifier, and Vicks vapor rub.  She will follow up with her PCP at the next available opportunity.  [CF]    Clinical Course User Index [CF] Hinda Kehr, MD    ____________________________________________  FINAL CLINICAL IMPRESSION(S) / ED DIAGNOSES  Final diagnoses:  Nasal congestion  Viral syndrome     MEDICATIONS GIVEN DURING THIS VISIT:  Medications  oxymetazoline (AFRIN) 0.05 % nasal spray 1 spray (1 spray Each Nare Given 08/29/16 0006)     NEW OUTPATIENT MEDICATIONS STARTED DURING THIS VISIT:  New Prescriptions   No medications on file    Modified Medications   No medications on file    Discontinued Medications   No medications on file     Note:  This document was prepared using Dragon voice recognition software and may include unintentional dictation errors.    Hinda Kehr, MD 08/29/16 0040

## 2016-08-28 NOTE — ED Notes (Signed)
Pt denies any body aches, fever, or body aches.  She says she is unable to breathe through her nose and frequently mentions having dry mouth.  Pt has no other complain ts or symptoms at this time.

## 2016-08-28 NOTE — ED Notes (Signed)
ED Provider at bedside. 

## 2016-08-28 NOTE — ED Triage Notes (Signed)
Patient presents with c/o nasal congestion that has been worsening over the course of the last week. NOS reported.

## 2016-08-29 NOTE — Discharge Instructions (Signed)
We suspect that your nasal congestion is due to a viral illness.  Please also use the oxymetazoline (Afrin) nasal spray if absolutely necessary because continued use can lead to a rebound congestion which is like being dependent on the medicine.  Please try using over-the-counter medications/treatments such as Vicks Vaporub, normal saline nasal spray, and a humidifier in your room at night.  Follow-up with your regular doctor at the next available opportunity.    Return to the emergency department if you develop new or worsening symptoms that concern you.

## 2016-09-03 ENCOUNTER — Encounter: Payer: Self-pay | Admitting: Family Medicine

## 2016-09-03 ENCOUNTER — Telehealth: Payer: Self-pay | Admitting: *Deleted

## 2016-09-03 ENCOUNTER — Telehealth: Payer: Self-pay | Admitting: Internal Medicine

## 2016-09-03 ENCOUNTER — Ambulatory Visit (INDEPENDENT_AMBULATORY_CARE_PROVIDER_SITE_OTHER): Payer: Medicare Other | Admitting: Family Medicine

## 2016-09-03 DIAGNOSIS — J3489 Other specified disorders of nose and nasal sinuses: Secondary | ICD-10-CM | POA: Insufficient documentation

## 2016-09-03 MED ORDER — FLUTICASONE PROPIONATE 50 MCG/ACT NA SUSP: 2.0000 | Freq: Every day | NASAL | 6 refills | Status: DC

## 2016-09-03 NOTE — Progress Notes (Signed)
   Subjective:  Patient ID: Tara [name], female    DOB: 02-Nov-1917  Age: 83 y.o. MRN: [id number]  CC: Nasal congestion  HPI:  83 year old female presents with complaints of nasal congestion.  Nasal congestion  Ongoing issue.  She is unsure of how long it has been going on. She saw her primary for it on 10/10.  It has recently been worsening. She was seen in the emergency department for it on 1/18.  She was given Afrin in the ER with improvement. She states that she has only used it a few times since her ER visit. However, the 15 mL bottle is empty.  She states that her symptoms have been worsening. She is unable to breathe out of her nose. She feels quite congested.  She reports associated shortness of breath when trying to breathe out of her nose. She is having to mouth breathe given the congestion.  No associated fever, chills, or other respiratory symptoms.  Her symptoms are severe per her report.  No other complaints or issues at this time.  Social Hx   Social History   Social History  . Marital status: Widowed    Spouse name: N/A  . Number of children: N/A  . Years of education: N/A   Social History Main Topics  . Smoking status: Never Smoker  . Smokeless tobacco: Never Used  . Alcohol use No  . Drug use: No  . Sexual activity: Not Asked   Other Topics Concern  . None   Social History Narrative  . None   Review of Systems  HENT: Positive for congestion.   Respiratory: Positive for shortness of breath.    Objective:  BP (!) 162/67   Pulse 75   Temp 98.5 F (36.9 C) (Oral)   Wt 93 lb 9.6 oz (42.5 kg)   SpO2 99%   BMI 18.90 kg/m   BP/Weight 09/03/2016 08/29/2016 11/17/1446  Systolic BP 185 631 -  Diastolic BP 67 79 -  Wt. (Lbs) 93.6 - 97  BMI 18.9 - 19.59   Physical Exam  Constitutional: She appears well-developed. No distress.  HENT:  Head: Normocephalic and atraumatic.  Nose - complete obstruction noted bilaterally.   Cardiovascular:  Normal rate.   Pulmonary/Chest: Effort normal and breath sounds normal.  Neurological: She is alert.  Vitals reviewed.  Assessment & Plan:   Problem List Items Addressed This Visit    Nasal obstruction    Nasal congestion now worsening. Nasal obstruction noted on exam bilaterally. This is likely worsened due to use of Afrin. Placing back on Flonase. We'll need to see ENT if persists.        Meds ordered this encounter  Medications  . fluticasone (FLONASE) 50 MCG/ACT nasal spray    Sig: Place 2 sprays into both nostrils daily.    Dispense:  16 g    Refill:  6    Follow-up: PRN  [place]

## 2016-09-03 NOTE — Assessment & Plan Note (Signed)
Nasal congestion now worsening. Nasal obstruction noted on exam bilaterally. This is likely worsened due to use of Afrin. Placing back on Flonase. We'll need to see ENT if persists.

## 2016-09-03 NOTE — Telephone Encounter (Signed)
Scheduled with Dr. Ledell Peoples today at 11.30.

## 2016-09-03 NOTE — Telephone Encounter (Signed)
Pt's daughter reported that pt is having SOB, possibly from congestion but not sure.  Pt transferred to [facility] [phone]

## 2016-09-03 NOTE — Telephone Encounter (Signed)
Dr. Nicki Reaper patient. Does she want to work her in today?

## 2016-09-03 NOTE — Telephone Encounter (Signed)
Patient Name: Tara [name]  DOB: 08-Sep-1917    Initial Comment Caller states, shortness of breath and seen in ED for congestion. Verified    Nurse Assessment  Nurse: Raphael Gibney, RN, Vanita Ingles Date/Time (Eastern Time): 09/03/2016 10:04:53 AM  Confirm and document reason for call. If symptomatic, describe symptoms. ---Caller states her mother was seen in the ER for congestion last Wednesday or Thursday. She is having some SOB due to nasal congestion. Has been using nasal spray which is not helping the congestion.  Does the patient have any new or worsening symptoms? ---Yes  Will a triage be completed? ---Yes  Related visit to physician within the last 2 weeks? ---Yes  Does the PT have any chronic conditions? (i.e. diabetes, asthma, etc.) ---Yes  List chronic conditions. ---diabetes  Is this a behavioral health or substance abuse call? ---No     Guidelines    Guideline Title Affirmed Question Affirmed Notes       Final Disposition User        Comments  answered urgent call and no one answered the phone. Attempted to return call using primary number and left message. Will call secondary number.  called secondary number and spoke to pt who asked me to call [name]. Attempted to call her back and left message.  office called while I was on the phone and daughter placed me on hold and when she came back ; daughter states that pt has appt at 11:30 today. Did not finish triage.

## 2016-09-03 NOTE — Progress Notes (Signed)
Pre visit review using our clinic review tool, if applicable. No additional management support is needed unless otherwise documented below in the visit note. 

## 2016-09-03 NOTE — Patient Instructions (Signed)
Flonase daily.  If persists, we will send her to ENT (please let us know if she fails to improve).  Take care  Dr. Lacinda Axon

## 2016-09-03 NOTE — Telephone Encounter (Signed)
Looks like she is being seen now by Dr Lacinda Axon.

## 2016-09-03 NOTE — Telephone Encounter (Signed)
Patient daughter stated she is just stopped in her head she was seen on 08/28/16  for nasal congestion given saline nasal spray and Flonase. Patient is not any better, patient is not coughing and had clear X-ray on 08/28/16. Patient will take appointment with another provider but only available is  11.30.

## 2016-09-10 ENCOUNTER — Ambulatory Visit (INDEPENDENT_AMBULATORY_CARE_PROVIDER_SITE_OTHER): Payer: Medicare Other | Admitting: Internal Medicine

## 2016-09-10 ENCOUNTER — Encounter: Payer: Self-pay | Admitting: Internal Medicine

## 2016-09-10 DIAGNOSIS — I10 Essential (primary) hypertension: Secondary | ICD-10-CM

## 2016-09-10 DIAGNOSIS — J3489 Other specified disorders of nose and nasal sinuses: Secondary | ICD-10-CM | POA: Diagnosis not present

## 2016-09-10 DIAGNOSIS — E1151 Type 2 diabetes mellitus with diabetic peripheral angiopathy without gangrene: Secondary | ICD-10-CM

## 2016-09-10 NOTE — Progress Notes (Signed)
Patient ID: Tara [name], female   DOB: 07/19/1918, 83 y.o.   MRN: [id number]   Subjective:    Patient ID: Tara [name], female    DOB: 10-26-1917, 83 y.o.   MRN: [id number]  HPI  Patient here for follow up.  Was seen in ER 08/28/16 with increased nasal congestion.  Instructed to use afrin.  Reevaluated 09/03/16 by Dr Lacinda Axon.  Instructed to use flonase.  She reports still having some increased nasal congestion.  No chest congestion.  No sob.  Trouble breathing because of nasal congestion.  No chest pain.  No acid reflux.  Eating and drinking.  No nausea or vomiting.     Past Medical History:  Diagnosis Date  . Asthma   . Diabetes mellitus ([place])   . Environmental allergies   . Hypertension    Past Surgical History:  Procedure Laterality Date  . APPENDECTOMY    . TUBAL LIGATION     Family History  Problem Relation Age of Onset  . Hypertension Mother   . Cancer Brother     lung (question)  . Cancer Sister     questionable type   Social History   Social History  . Marital status: Widowed    Spouse name: N/A  . Number of children: N/A  . Years of education: N/A   Social History Main Topics  . Smoking status: Never Smoker  . Smokeless tobacco: Never Used  . Alcohol use No  . Drug use: No  . Sexual activity: Not Asked   Other Topics Concern  . None   Social History Narrative  . None    Outpatient Encounter Prescriptions as of 09/10/2016  Medication Sig  . acetaminophen (TYLENOL) 325 MG tablet Take 650 mg by mouth every 6 (six) hours as needed.  Marland Kitchen amLODipine (NORVASC) 5 MG tablet TAKE ONE TABLET TWICE DAILY  . aspirin 81 MG tablet Take 81 mg by mouth daily.  . Calcium Carbonate-Vitamin D (CALTRATE 600+D PO) Take by mouth.  . Cholecalciferol (D3 SUPER STRENGTH) 2000 UNITS CAPS Take by mouth.  . fexofenadine (ALLEGRA) 180 MG tablet Take 180 mg by mouth daily.  . fluticasone (FLONASE) 50 MCG/ACT nasal spray Place 2 sprays into both nostrils daily.  Marland Kitchen glucose blood  (ACCU-CHEK AVIVA PLUS) test strip CHECK SUGAR ONCE DAILY  . hydrALAZINE (APRESOLINE) 50 MG tablet Take 1 tablet (50 mg total) by mouth 3 (three) times daily.  . Insulin Pen Needle (SURE COMFORT PEN NEEDLES) 31G X 8 MM MISC AS DIRECTED  . metoprolol succinate (TOPROL-XL) 25 MG 24 hr tablet TAKE ONE (1) TABLET BY MOUTH EVERY DAY  . metoprolol succinate (TOPROL-XL) 25 MG 24 hr tablet TAKE ONE (1) TABLET EACH DAY  . Multiple Vitamin (MULTIVITAMIN) tablet Take 1 tablet by mouth daily. Reported on 12/24/2015  . SYNTHROID 50 MCG tablet TAKE ONE (1) TABLET EACH DAY   No facility-administered encounter medications on file as of 09/10/2016.     Review of Systems  Constitutional: Negative for appetite change and unexpected weight change.  HENT: Positive for congestion. Negative for sinus pressure.   Respiratory: Negative for cough, chest tightness and shortness of breath.   Cardiovascular: Negative for chest pain, palpitations and leg swelling.  Gastrointestinal: Negative for diarrhea, nausea and vomiting.  Musculoskeletal: Negative for myalgias.  Skin: Negative for color change and rash.  Neurological: Negative for dizziness, light-headedness and headaches.  Psychiatric/Behavioral: Negative for agitation and dysphoric mood.       Objective:  Physical Exam  Constitutional: She appears well-developed and well-nourished. No distress.  HENT:  Nose: Nose normal.  Mouth/Throat: Oropharynx is clear and moist.  Nares - erythematous turbinates.  No significant sinus tenderness to palpation.    Neck: Neck supple. No thyromegaly present.  Cardiovascular: Normal rate and regular rhythm.   Pulmonary/Chest: Breath sounds normal. No respiratory distress. She has no wheezes.  Abdominal: Soft. Bowel sounds are normal. There is no tenderness.  Musculoskeletal: She exhibits no edema or tenderness.  Lymphadenopathy:    She has no cervical adenopathy.  Skin: No rash noted. No erythema.  Psychiatric: She has  a normal mood and affect. Her behavior is normal.    BP 120/60 (BP Location: Left Arm, Patient Position: Sitting, Cuff Size: Large)   Pulse 69   Temp 98.6 F (37 C) (Oral)   Resp 18   Ht 4' 11"  (1.499 m)   Wt 93 lb 12.8 oz (42.5 kg)   SpO2 98%   BMI 18.95 kg/m  Wt Readings from Last 3 Encounters:  09/10/16 93 lb 12.8 oz (42.5 kg)  09/03/16 93 lb 9.6 oz (42.5 kg)  08/28/16 97 lb (44 kg)     Lab Results  Component Value Date   WBC 5.4 03/13/2016   HGB 10.6 (L) 03/13/2016   HCT 31.9 (L) 03/13/2016   PLT 226.0 03/13/2016   GLUCOSE 133 (H) 03/13/2016   CHOL 209 (H) 06/21/2014   TRIG 105.0 06/21/2014   HDL 77.00 06/21/2014   LDLDIRECT 107.1 07/29/2013   LDLCALC 111 (H) 06/21/2014   ALT 15 03/13/2016   AST 24 03/13/2016   NA 144 03/13/2016   K 4.3 03/13/2016   CL 109 03/13/2016   CREATININE 0.88 03/13/2016   BUN 30 (H) 03/13/2016   CO2 29 03/13/2016   TSH 2.61 03/13/2016   INR 1.0 01/25/2013   HGBA1C 6.0 03/13/2016   MICROALBUR 115.0 Repeated and verified X2. (H) 07/29/2013       Assessment & Plan:   Problem List Items Addressed This Visit    Diabetes mellitus with peripheral vascular disease ([place])    No low sugars.  Follow met b and a1c.        Hypertension    Blood pressure under good control.  Continue same medication regimen.  Follow pressures.  Follow metabolic panel.        Nasal obstruction    Increased nasal congestion.  Lungs clear.  Saline nasal spray and nasacort nasal spray as directed.  Hold allegra temporarily.  Follow.  Will need ENT evaluation if persistent.            Einar Pheasant, MD

## 2016-09-10 NOTE — Progress Notes (Signed)
Pre-visit discussion using our clinic review tool. No additional management support is needed unless otherwise documented below in the visit note.  

## 2016-09-10 NOTE — Patient Instructions (Signed)
Stop flonase.    Start nasacort nasal spray - 2 sprays each nostril one time per day.  Do this in the evening.   Saline nasal spray - flush nose at least 2-3x/day  Robitussin twice a day as needed.    Hold allegra (fexofenadine) temporarily.

## 2016-09-14 ENCOUNTER — Encounter: Payer: Self-pay | Admitting: Internal Medicine

## 2016-09-14 MED ORDER — AMLODIPINE BESYLATE 5 MG PO TABS: 5.0000 mg | ORAL_TABLET | Freq: Two times a day (BID) | ORAL | 3 refills | Status: DC

## 2016-09-14 NOTE — Assessment & Plan Note (Signed)
Blood pressure under good control.  Continue same medication regimen.  Follow pressures.  Follow metabolic panel.   

## 2016-09-14 NOTE — Assessment & Plan Note (Signed)
No low sugars.  Follow met b and a1c.

## 2016-09-14 NOTE — Assessment & Plan Note (Addendum)
Increased nasal congestion.  Lungs clear.  Saline nasal spray and nasacort nasal spray as directed.  Hold allegra temporarily.  Follow.  Will need ENT evaluation if persistent.

## 2016-10-30 ENCOUNTER — Other Ambulatory Visit: Payer: Self-pay | Admitting: Internal Medicine

## 2016-11-06 ENCOUNTER — Ambulatory Visit (INDEPENDENT_AMBULATORY_CARE_PROVIDER_SITE_OTHER): Payer: Medicare Other

## 2016-11-06 VITALS — BP 142/62 | HR 70 | Temp 98.2°F | Resp 12 | Ht <= 58 in | Wt 91.0 lb

## 2016-11-06 DIAGNOSIS — Z0000 Encounter for general adult medical examination without abnormal findings: Secondary | ICD-10-CM | POA: Diagnosis not present

## 2016-11-06 NOTE — Patient Instructions (Addendum)
  Tara [name] , Thank you for taking time to come for your Medicare Wellness Visit. I appreciate your ongoing commitment to your health goals. Please review the following plan we discussed and let me know if I can assist you in the future.   Follow up with Dr. Nicki Reaper as needed.    Have a great day!  Tara [name]!  These are the goals we discussed: Goals    . Increase water intake          Stay hydrated and drink plenty of fluids/water       This is a list of the screening recommended for you and due dates:  Health Maintenance  Topic Date Due  . Complete foot exam   02/11/1930  . Tetanus Vaccine  02/12/1939  . DEXA scan (bone density measurement)  02/11/1985  . Pneumonia vaccines (2 of 2 - PPSV23) 09/13/2015  . Hemoglobin A1C  09/13/2016  . Eye exam for diabetics  12/16/2016  . Flu Shot  Completed

## 2016-11-06 NOTE — Progress Notes (Signed)
Subjective:   Tara [name] is a 83 y.o. female who presents for an Initial Medicare Annual Wellness Visit.  Review of Systems    No ROS.  Medicare Wellness Visit.  Cardiac Risk Factors include: advanced age (>94men, >10 women);hypertension;diabetes mellitus     Objective:    Today's Vitals   11/06/16 1513  BP: (!) 142/62  Pulse: 70  Resp: 12  Temp: 98.2 F (36.8 C)  TempSrc: Oral  SpO2: 97%  Weight: 91 lb (41.3 kg)  Height: 4' 9.5" (1.461 m)   Body mass index is 19.35 kg/m.   Current Medications (verified) Outpatient Encounter Prescriptions as of 11/06/2016  Medication Sig  . acetaminophen (TYLENOL) 325 MG tablet Take 650 mg by mouth every 6 (six) hours as needed.  Marland Kitchen amLODipine (NORVASC) 5 MG tablet Take 1 tablet (5 mg total) by mouth 2 (two) times daily.  Marland Kitchen aspirin 81 MG tablet Take 81 mg by mouth daily.  . Calcium Carbonate-Vitamin D (CALTRATE 600+D PO) Take by mouth.  . Cholecalciferol (D3 SUPER STRENGTH) 2000 UNITS CAPS Take by mouth.  . fexofenadine (ALLEGRA) 180 MG tablet Take 180 mg by mouth daily.  . fluticasone (FLONASE) 50 MCG/ACT nasal spray Place 2 sprays into both nostrils daily.  Marland Kitchen glucose blood (ACCU-CHEK AVIVA PLUS) test strip CHECK SUGAR ONCE DAILY  . hydrALAZINE (APRESOLINE) 50 MG tablet TAKE ONE (1) TABLET THREE (3) TIMES EACH DAY  . Insulin Pen Needle (SURE COMFORT PEN NEEDLES) 31G X 8 MM MISC AS DIRECTED  . metoprolol succinate (TOPROL-XL) 25 MG 24 hr tablet TAKE ONE (1) TABLET BY MOUTH EVERY DAY  . metoprolol succinate (TOPROL-XL) 25 MG 24 hr tablet TAKE ONE (1) TABLET EACH DAY  . Multiple Vitamin (MULTIVITAMIN) tablet Take 1 tablet by mouth daily. Reported on 12/24/2015  . SYNTHROID 50 MCG tablet TAKE ONE (1) TABLET EACH DAY   No facility-administered encounter medications on file as of 11/06/2016.     Allergies (verified) Ace inhibitors; Hydrochlorothiazide w-triamterene; Lisinopril; Penicillins; and Trichlormethiazide   History: Past  Medical History:  Diagnosis Date  . Asthma   . Diabetes mellitus ([place])   . Environmental allergies   . Hypertension    Past Surgical History:  Procedure Laterality Date  . APPENDECTOMY    . TUBAL LIGATION     Family History  Problem Relation Age of Onset  . Hypertension Mother   . Cancer Brother     lung (question)  . Cancer Sister     questionable type   Social History   Occupational History  . Not on file.   Social History Main Topics  . Smoking status: Never Smoker  . Smokeless tobacco: Never Used  . Alcohol use No  . Drug use: No  . Sexual activity: No    Tobacco Counseling Counseling given: Not Answered   Activities of Daily Living In your present state of health, do you have any difficulty performing the following activities: 11/06/2016  Hearing? Y  Vision? N  Difficulty concentrating or making decisions? N  Walking or climbing stairs? Y  Dressing or bathing? N  Doing errands, shopping? Y  Preparing Food and eating ? N  Using the Toilet? N  In the past six months, have you accidently leaked urine? Y  Do you have problems with loss of bowel control? N  Managing your Medications? N  Managing your Finances? N  Housekeeping or managing your Housekeeping? N  Some recent data might be hidden    Immunizations and  Health Maintenance Immunization History  Administered Date(s) Administered  . Influenza, High Dose Seasonal PF 05/20/2016  . Influenza,inj,Quad PF,36+ Mos 05/12/2013, 04/26/2014, 04/10/2015  . Pneumococcal Conjugate-13 09/12/2014   Health Maintenance Due  Topic Date Due  . FOOT EXAM  02/11/1930  . DEXA SCAN  02/11/1985  . PNA vac Low Risk Adult (2 of 2 - PPSV23) 09/13/2015  . HEMOGLOBIN A1C  09/13/2016    Patient Care Team: Einar Pheasant, MD as PCP - General (Internal Medicine)  Indicate any recent Medical Services you may have received from other than [facility] providers in the past year (date may be approximate).     Assessment:   This  is a routine wellness examination for Tara [name]. The goal of the wellness visit is to assist the patient how to close the gaps in care and create a preventative care plan for the patient.   Taking calcium VIT D3 as appropriate/Osteoporosis risk reviewed.  Medications reviewed; taking without issues or barriers.  Safety issues reviewed; smoke detectors in the home. No firearms in the home.  Wears seatbelts when driving or riding with others. Patient does wear sunscreen or protective clothing when in direct sunlight. No violence in the home.  Patient is alert, normal appearance, oriented to person/place/and time. Correctly identified the president of the [place], recall of 3/3 words, and performing simple calculations.  Patient displays appropriate judgement and can read correct time from watch face.  No new identified risk were noted.  No failures at ADL's or IADL's. Walker in use when ambulating.  BMI- discussed the importance of a healthy diet, water intake and exercise. Educational material provided.   HTN- followed by PCP.  Dental- every six months.  Daily fluid intake: 3 sodas, half cup of water. Encouraged to increase water intake.  Eye- Visual acuity not assessed per patient preference since they have regular follow up with the ophthalmologist.  Wears corrective lenses.  Sleep patterns- Sleeps 5 hours at night and naps throughout the day.  Wakes feeling rested.  TDAP vaccine deferred per patient preference.  Follow up with insurance.  Educational material provided.  Health maintenance gaps- closed.  Patient Concerns: None at this time. Follow up with PCP as needed.  Hearing/Vision screen Hearing Screening Comments: Hearing aid, bilateral Vision Screening Comments: Followed by [facility] Wears corrective lenses Last OV 01/2016 Visual acuity not assessed per patient preference since they have regular follow up with the ophthalmologist  Dietary issues and exercise  activities discussed: Current Exercise Habits: Home exercise routine, Type of exercise: walking, Time (Minutes): 10, Frequency (Times/Week): 5, Weekly Exercise (Minutes/Week): 50, Intensity: Mild  Goals    . Increase water intake          Stay hydrated and drink plenty of fluids/water      Depression Screen PHQ 2/9 Scores 11/06/2016 05/20/2016 06/23/2014 01/25/2013 11/28/2012 06/11/2012  PHQ - 2 Score 0 0 0 0 0 0    Fall Risk Fall Risk  11/06/2016 06/19/2016 05/20/2016 06/23/2014 05/12/2013  Falls in the past year? No Yes No Yes Yes  Number falls in past yr: - 1 - 2 or more 2 or more  Injury with Fall? - No - - -  Risk Factor Category  - - - - High Fall Risk  Risk for fall due to : - - - - Impaired balance/gait    Cognitive Function:     6CIT Screen 11/06/2016  What Year? 0 points  What month? 0 points  What time? 0 points  Count back from 20 0 points  Months in reverse 0 points  Repeat phrase 0 points  Total Score 0    Screening Tests Health Maintenance  Topic Date Due  . FOOT EXAM  02/11/1930  . DEXA SCAN  02/11/1985  . PNA vac Low Risk Adult (2 of 2 - PPSV23) 09/13/2015  . HEMOGLOBIN A1C  09/13/2016  . TETANUS/TDAP  11/08/2017 (Originally 02/12/1939)  . OPHTHALMOLOGY EXAM  12/16/2016  . INFLUENZA VACCINE  Completed      Plan:   End of life planning; Advanced aging; Advanced directives discussed.  No HCPOA/Living Will.  Additional information declined at this time.  Medicare Attestation I have personally reviewed: The patient's medical and social history Their use of alcohol, tobacco or illicit drugs Their current medications and supplements The patient's functional ability including ADLs,fall risks, home safety risks, cognitive, and hearing and visual impairment Diet and physical activities Evidence for depression   The patient's weight, height, BMI, and visual acuity have been recorded in the chart.  I have made referrals and provided education to the patient  based on review of the above and I have provided the patient with a written personalized care plan for preventive services.    During the course of the visit, Tara [name] was educated and counseled about the following appropriate screening and preventive services:   Vaccines to include Pneumoccal, Influenza, Hepatitis B, Td, Zostavax, HCV  Colorectal cancer screening-aged out/UTD  Bone density screening-discussed  Diabetes-followed by PCP  Glaucoma-annual eye exam   Mammography/PAP-aged out/UTD  Nutrition counseling  Patient Instructions (the written plan) were given to the patient.    Varney Biles, LPN   9/92/[id number]    Reviewed above information.  Agree with plan.  Dr Nicki Reaper

## 2016-11-21 ENCOUNTER — Other Ambulatory Visit: Payer: Self-pay | Admitting: Internal Medicine

## 2016-11-27 ENCOUNTER — Ambulatory Visit (INDEPENDENT_AMBULATORY_CARE_PROVIDER_SITE_OTHER): Payer: Medicare Other | Admitting: Internal Medicine

## 2016-11-27 ENCOUNTER — Encounter: Payer: Self-pay | Admitting: Internal Medicine

## 2016-11-27 VITALS — BP 136/60 | HR 69 | Temp 98.1°F | Resp 12 | Ht <= 58 in | Wt 92.0 lb

## 2016-11-27 DIAGNOSIS — I10 Essential (primary) hypertension: Secondary | ICD-10-CM

## 2016-11-27 DIAGNOSIS — E7800 Pure hypercholesterolemia, unspecified: Secondary | ICD-10-CM | POA: Diagnosis not present

## 2016-11-27 DIAGNOSIS — E039 Hypothyroidism, unspecified: Secondary | ICD-10-CM

## 2016-11-27 DIAGNOSIS — D649 Anemia, unspecified: Secondary | ICD-10-CM | POA: Diagnosis not present

## 2016-11-27 DIAGNOSIS — J3489 Other specified disorders of nose and nasal sinuses: Secondary | ICD-10-CM

## 2016-11-27 DIAGNOSIS — E2839 Other primary ovarian failure: Secondary | ICD-10-CM | POA: Diagnosis not present

## 2016-11-27 DIAGNOSIS — E1151 Type 2 diabetes mellitus with diabetic peripheral angiopathy without gangrene: Secondary | ICD-10-CM

## 2016-11-27 DIAGNOSIS — I739 Peripheral vascular disease, unspecified: Secondary | ICD-10-CM

## 2016-11-27 DIAGNOSIS — Z9109 Other allergy status, other than to drugs and biological substances: Secondary | ICD-10-CM

## 2016-11-27 MED ORDER — SYNTHROID 50 MCG PO TABS: ORAL_TABLET | ORAL | 2 refills | Status: DC

## 2016-11-27 MED ORDER — AMLODIPINE BESYLATE 5 MG PO TABS: 5.0000 mg | ORAL_TABLET | Freq: Two times a day (BID) | ORAL | 5 refills | Status: DC

## 2016-11-27 MED ORDER — HYDRALAZINE HCL 50 MG PO TABS: ORAL_TABLET | ORAL | 2 refills | Status: DC

## 2016-11-27 NOTE — Patient Instructions (Signed)
Zantac (ranitidine) 150mg  - take 30 minutes before evening meal.

## 2016-11-27 NOTE — Progress Notes (Signed)
Pre-visit discussion using our clinic review tool. No additional management support is needed unless otherwise documented below in the visit note.  

## 2016-11-27 NOTE — Progress Notes (Signed)
Patient ID: Tara [name], female   DOB: Dec 23, 1917, 83 y.o.   MRN: [id number]   Subjective:    Patient ID: Tara [name], female    DOB: [birth year], 83 y.o.   MRN: [id number]  HPI  Patient here for a scheduled follow up.  She reports she is doing relatively well.  She is accompanied by her daughter.  History obtained from both of them.  She is still having issues with feeling like her nose is stopped up.  She is using her nasal sprays.  No significant improvement.  No chest congestion.  No increased sob.  Just feels she cannot get air in through her nose.  No chest pain.  Eating.  Tries to stay active.  No acid reflux.  No abdominal pain.  Bowels moving.     Past Medical History:  Diagnosis Date  . Asthma   . Diabetes mellitus ([place])   . Environmental allergies   . Hypertension    Past Surgical History:  Procedure Laterality Date  . APPENDECTOMY    . TUBAL LIGATION     Family History  Problem Relation Age of Onset  . Hypertension Mother   . Cancer Brother     lung (question)  . Cancer Sister     questionable type   Social History   Social History  . Marital status: Widowed    Spouse name: N/A  . Number of children: N/A  . Years of education: N/A   Social History Main Topics  . Smoking status: Never Smoker  . Smokeless tobacco: Never Used  . Alcohol use No  . Drug use: No  . Sexual activity: No   Other Topics Concern  . None   Social History Narrative  . None    Outpatient Encounter Prescriptions as of 11/27/2016  Medication Sig  . acetaminophen (TYLENOL) 325 MG tablet Take 650 mg by mouth every 6 (six) hours as needed.  Marland Kitchen amLODipine (NORVASC) 5 MG tablet Take 1 tablet (5 mg total) by mouth 2 (two) times daily.  Marland Kitchen aspirin 81 MG tablet Take 81 mg by mouth daily.  . Calcium Carbonate-Vitamin D (CALTRATE 600+D PO) Take by mouth.  . Cholecalciferol (D3 SUPER STRENGTH) 2000 UNITS CAPS Take by mouth.  . fexofenadine (ALLEGRA) 180 MG tablet Take 180 mg by mouth daily.    . fluticasone (FLONASE) 50 MCG/ACT nasal spray Place 2 sprays into both nostrils daily.  Marland Kitchen glucose blood (ACCU-CHEK AVIVA PLUS) test strip CHECK SUGAR ONCE DAILY  . hydrALAZINE (APRESOLINE) 50 MG tablet TAKE ONE (1) TABLET THREE (3) TIMES EACH DAY  . metoprolol succinate (TOPROL-XL) 25 MG 24 hr tablet TAKE ONE (1) TABLET EACH DAY  . Multiple Vitamin (MULTIVITAMIN) tablet Take 1 tablet by mouth daily. Reported on 12/24/2015  . SYNTHROID 50 MCG tablet TAKE ONE (1) TABLET EACH DAY  . [DISCONTINUED] amLODipine (NORVASC) 5 MG tablet Take 1 tablet (5 mg total) by mouth 2 (two) times daily.  . [DISCONTINUED] hydrALAZINE (APRESOLINE) 50 MG tablet TAKE ONE (1) TABLET THREE (3) TIMES EACH DAY  . [DISCONTINUED] Insulin Pen Needle (SURE COMFORT PEN NEEDLES) 31G X 8 MM MISC AS DIRECTED  . [DISCONTINUED] SYNTHROID 50 MCG tablet TAKE ONE (1) TABLET EACH DAY   No facility-administered encounter medications on file as of 11/27/2016.     Review of Systems  Constitutional: Negative for appetite change and unexpected weight change.  HENT: Positive for congestion. Negative for sinus pressure.   Respiratory: Negative for cough, chest tightness and  shortness of breath.   Cardiovascular: Negative for chest pain, palpitations and leg swelling.  Gastrointestinal: Negative for abdominal pain, diarrhea, nausea and vomiting.  Genitourinary: Negative for difficulty urinating and dysuria.  Musculoskeletal: Negative for joint swelling and myalgias.  Skin: Negative for color change and rash.  Neurological: Negative for dizziness, light-headedness and headaches.  Psychiatric/Behavioral: Negative for agitation and dysphoric mood.       Objective:    Physical Exam  Constitutional: She appears well-developed and well-nourished. No distress.  HENT:  Mouth/Throat: Oropharynx is clear and moist.  Nares - erythematous turbinates.  Narrowed nasal passage.    Neck: Neck supple. No thyromegaly present.  Cardiovascular:  Normal rate and regular rhythm.   Pulmonary/Chest: Breath sounds normal. No respiratory distress. She has no wheezes.  Abdominal: Soft. Bowel sounds are normal. There is no tenderness.  Musculoskeletal: She exhibits no edema or tenderness.  Lymphadenopathy:    She has no cervical adenopathy.  Skin: No rash noted. No erythema.  Psychiatric: She has a normal mood and affect. Her behavior is normal.    BP 136/60 (BP Location: Left Arm, Patient Position: Sitting, Cuff Size: Normal)   Pulse 69   Temp 98.1 F (36.7 C) (Oral)   Resp 12   Ht _0  (1.473 m)   Wt 92 lb (41.7 kg)   SpO2 97%   BMI 19.23 kg/m  Wt Readings from Last 3 Encounters:  11/27/16 92 lb (41.7 kg)  11/06/16 91 lb (41.3 kg)  09/10/16 93 lb 12.8 oz (42.5 kg)     Lab Results  Component Value Date   WBC 5.4 03/13/2016   HGB 10.6 (L) 03/13/2016   HCT 31.9 (L) 03/13/2016   PLT 226.0 03/13/2016   GLUCOSE 133 (H) 03/13/2016   CHOL 209 (H) 06/21/2014   TRIG 105.0 06/21/2014   HDL 77.00 06/21/2014   LDLDIRECT 107.1 07/29/2013   LDLCALC 111 (H) 06/21/2014   ALT 15 03/13/2016   AST 24 03/13/2016   NA 144 03/13/2016   K 4.3 03/13/2016   CL 109 03/13/2016   CREATININE 0.88 03/13/2016   BUN 30 (H) 03/13/2016   CO2 29 03/13/2016   TSH 2.61 03/13/2016   INR 1.0 01/25/2013   HGBA1C 6.0 03/13/2016   MICROALBUR 115.0 Repeated and verified X2. (H) 07/29/2013       Assessment & Plan:   Problem List Items Addressed This Visit    Anemia    Follow cbc.       Diabetes mellitus with peripheral vascular disease ([facility])    No low sugars.  Follow met b and a1c.        Relevant Medications   amLODipine (NORVASC) 5 MG tablet   hydrALAZINE (APRESOLINE) 50 MG tablet   Hypercholesteremia    Follow lipid panel.  Given age, hold on starting a statin medication.        Relevant Medications   amLODipine (NORVASC) 5 MG tablet   hydrALAZINE (APRESOLINE) 50 MG tablet   Hypertension    Blood pressure under good control.   Continue same medication regimen.  Follow pressures.  Follow metabolic panel.        Relevant Medications   amLODipine (NORVASC) 5 MG tablet   hydrALAZINE (APRESOLINE) 50 MG tablet   Hypothyroidism    On thyroid replacement.  Follow tsh.        Relevant Medications   SYNTHROID 50 MCG tablet   Nasal obstruction    Using saline nasal spray and nasacort as directed.  Will  have ent evaluate.  Pt in agreement.  Will start zantac to confirm no acid reflux contributing to any of her symptoms.        Relevant Orders   Ambulatory referral to ENT   Peripheral vascular disease ([place])    Followed by vascular surgery.  Stable.        Relevant Medications   amLODipine (NORVASC) 5 MG tablet   hydrALAZINE (APRESOLINE) 50 MG tablet    Other Visit Diagnoses    Estrogen deficiency    -  Primary   Relevant Orders   DG Bone Density   Environmental allergies       Relevant Orders   Ambulatory referral to ENT       Einar Pheasant, MD

## 2016-12-12 DIAGNOSIS — H6502 Acute serous otitis media, left ear: Secondary | ICD-10-CM | POA: Diagnosis not present

## 2016-12-12 DIAGNOSIS — H6122 Impacted cerumen, left ear: Secondary | ICD-10-CM | POA: Diagnosis not present

## 2016-12-12 DIAGNOSIS — H6982 Other specified disorders of Eustachian tube, left ear: Secondary | ICD-10-CM | POA: Diagnosis not present

## 2016-12-12 DIAGNOSIS — J301 Allergic rhinitis due to pollen: Secondary | ICD-10-CM | POA: Diagnosis not present

## 2016-12-13 ENCOUNTER — Encounter: Payer: Self-pay | Admitting: Internal Medicine

## 2016-12-13 NOTE — Assessment & Plan Note (Signed)
Follow cbc.  

## 2016-12-13 NOTE — Assessment & Plan Note (Signed)
On thyroid replacement.  Follow tsh.  

## 2016-12-13 NOTE — Assessment & Plan Note (Addendum)
Using saline nasal spray and nasacort as directed.  Will have ent evaluate.  Pt in agreement.  Will start zantac to confirm no acid reflux contributing to any of her symptoms.

## 2016-12-13 NOTE — Assessment & Plan Note (Signed)
Blood pressure under good control.  Continue same medication regimen.  Follow pressures.  Follow metabolic panel.   

## 2016-12-13 NOTE — Assessment & Plan Note (Signed)
Follow lipid panel.  Given age, hold on starting a statin medication.

## 2016-12-13 NOTE — Assessment & Plan Note (Signed)
No low sugars.  Follow met b and a1c.   

## 2016-12-13 NOTE — Assessment & Plan Note (Signed)
Followed by vascular surgery. Stable.   

## 2017-01-07 ENCOUNTER — Ambulatory Visit
Admission: RE | Admit: 2017-01-07 | Discharge: 2017-01-07 | Disposition: A | Discharge status: Home / Self Care | Payer: Medicare Other | Source: Ambulatory Visit | Attending: Internal Medicine | Admitting: Internal Medicine

## 2017-01-07 DIAGNOSIS — E2839 Other primary ovarian failure: Secondary | ICD-10-CM | POA: Diagnosis not present

## 2017-01-07 DIAGNOSIS — Z13820 Encounter for screening for osteoporosis: Secondary | ICD-10-CM | POA: Insufficient documentation

## 2017-01-07 DIAGNOSIS — M810 Age-related osteoporosis without current pathological fracture: Secondary | ICD-10-CM | POA: Insufficient documentation

## 2017-01-08 ENCOUNTER — Encounter: Payer: Self-pay | Admitting: *Deleted

## 2017-01-09 DIAGNOSIS — J301 Allergic rhinitis due to pollen: Secondary | ICD-10-CM | POA: Diagnosis not present

## 2017-01-09 DIAGNOSIS — H6522 Chronic serous otitis media, left ear: Secondary | ICD-10-CM | POA: Diagnosis not present

## 2017-02-19 DIAGNOSIS — H6522 Chronic serous otitis media, left ear: Secondary | ICD-10-CM | POA: Diagnosis not present

## 2017-02-19 DIAGNOSIS — J310 Chronic rhinitis: Secondary | ICD-10-CM | POA: Diagnosis not present

## 2017-03-10 ENCOUNTER — Ambulatory Visit (INDEPENDENT_AMBULATORY_CARE_PROVIDER_SITE_OTHER): Payer: Medicare Other | Admitting: Internal Medicine

## 2017-03-10 ENCOUNTER — Encounter: Payer: Self-pay | Admitting: Internal Medicine

## 2017-03-10 VITALS — BP 140/62 | HR 65 | Temp 98.3°F | Resp 12 | Ht <= 58 in | Wt 89.4 lb

## 2017-03-10 DIAGNOSIS — R634 Abnormal weight loss: Secondary | ICD-10-CM | POA: Diagnosis not present

## 2017-03-10 DIAGNOSIS — I10 Essential (primary) hypertension: Secondary | ICD-10-CM

## 2017-03-10 DIAGNOSIS — I739 Peripheral vascular disease, unspecified: Secondary | ICD-10-CM

## 2017-03-10 DIAGNOSIS — E039 Hypothyroidism, unspecified: Secondary | ICD-10-CM

## 2017-03-10 DIAGNOSIS — E1151 Type 2 diabetes mellitus with diabetic peripheral angiopathy without gangrene: Secondary | ICD-10-CM

## 2017-03-10 DIAGNOSIS — E7800 Pure hypercholesterolemia, unspecified: Secondary | ICD-10-CM

## 2017-03-10 DIAGNOSIS — Z23 Encounter for immunization: Secondary | ICD-10-CM | POA: Diagnosis not present

## 2017-03-10 DIAGNOSIS — M810 Age-related osteoporosis without current pathological fracture: Secondary | ICD-10-CM

## 2017-03-10 DIAGNOSIS — D649 Anemia, unspecified: Secondary | ICD-10-CM | POA: Diagnosis not present

## 2017-03-10 LAB — URINALYSIS, ROUTINE W REFLEX MICROSCOPIC
Bilirubin Urine: NEGATIVE
Hgb urine dipstick: NEGATIVE
KETONES UR: NEGATIVE
LEUKOCYTES UA: NEGATIVE
Nitrite: NEGATIVE
PH: 6 (ref 5.0–8.0)
RBC / HPF: NONE SEEN (ref 0–?)
SPECIFIC GRAVITY, URINE: 1.015 (ref 1.000–1.030)
TOTAL PROTEIN, URINE-UPE24: 100 — AB
UROBILINOGEN UA: 0.2 (ref 0.0–1.0)
Urine Glucose: NEGATIVE

## 2017-03-10 LAB — CBC WITH DIFFERENTIAL/PLATELET
BASOS PCT: 0.7 % (ref 0.0–3.0)
Basophils Absolute: 0 10*3/uL (ref 0.0–0.1)
EOS PCT: 3.8 % (ref 0.0–5.0)
Eosinophils Absolute: 0.2 10*3/uL (ref 0.0–0.7)
HEMATOCRIT: 35.9 % — AB (ref 36.0–46.0)
Hemoglobin: 11.6 g/dL — ABNORMAL LOW (ref 12.0–15.0)
LYMPHS PCT: 43.8 % (ref 12.0–46.0)
Lymphs Abs: 2.7 10*3/uL (ref 0.7–4.0)
MCHC: 32.3 g/dL (ref 30.0–36.0)
MCV: 95.5 fl (ref 78.0–100.0)
MONO ABS: 0.6 10*3/uL (ref 0.1–1.0)
MONOS PCT: 9.5 % (ref 3.0–12.0)
Neutro Abs: 2.7 10*3/uL (ref 1.4–7.7)
Neutrophils Relative %: 42.2 % — ABNORMAL LOW (ref 43.0–77.0)
Platelets: 230 10*3/uL (ref 150.0–400.0)
RBC: 3.76 Mil/uL — ABNORMAL LOW (ref 3.87–5.11)
RDW: 15.4 % (ref 11.5–15.5)
WBC: 6.3 10*3/uL (ref 4.0–10.5)

## 2017-03-10 LAB — TSH: TSH: 2.38 u[IU]/mL (ref 0.35–4.50)

## 2017-03-10 LAB — FERRITIN: FERRITIN: 113.3 ng/mL (ref 10.0–291.0)

## 2017-03-10 LAB — HEMOGLOBIN A1C: HEMOGLOBIN A1C: 6.4 % (ref 4.6–6.5)

## 2017-03-10 NOTE — Progress Notes (Signed)
Patient ID: BAELYNN SCHMUHL, female   DOB: Aug 16, 1917, 83 y.o.   MRN: [id number]   Subjective:    Patient ID: [name], female    DOB: [birth year], 83 y.o.   MRN: [id number]  HPI  Patient here for a scheduled follow up.  She is accompanied by her daughter.  History obtained from both of them.  She reports she is doing relatively well.  No chest pain.  Breathing stable.  Saw ENT.  Using nasal spray, etc.  Is some better.  No chest congestion.  No acid reflux.  No abdominal pain.  Bowels moving.  Some leg pain.     Past Medical History:  Diagnosis Date  . Asthma   . Diabetes mellitus ([place])   . Environmental allergies   . Hypertension    Past Surgical History:  Procedure Laterality Date  . APPENDECTOMY    . TUBAL LIGATION     Family History  Problem Relation Age of Onset  . Hypertension Mother   . Cancer Brother        lung (question)  . Cancer Sister        questionable type   Social History   Social History  . Marital status: Widowed    Spouse name: N/A  . Number of children: N/A  . Years of education: N/A   Social History Main Topics  . Smoking status: Never Smoker  . Smokeless tobacco: Never Used  . Alcohol use No  . Drug use: No  . Sexual activity: No   Other Topics Concern  . None   Social History Narrative  . None    Outpatient Encounter Prescriptions as of 03/10/2017  Medication Sig  . acetaminophen (TYLENOL) 325 MG tablet Take 650 mg by mouth every 6 (six) hours as needed.  Marland Kitchen amLODipine (NORVASC) 5 MG tablet Take 1 tablet (5 mg total) by mouth 2 (two) times daily.  Marland Kitchen aspirin 81 MG tablet Take 81 mg by mouth daily.  . Calcium Carbonate-Vitamin D (CALTRATE 600+D PO) Take by mouth.  . Cholecalciferol (D3 SUPER STRENGTH) 2000 UNITS CAPS Take by mouth.  . fexofenadine (ALLEGRA) 180 MG tablet Take 180 mg by mouth daily.  . fluticasone (FLONASE) 50 MCG/ACT nasal spray Place 2 sprays into both nostrils daily.  Marland Kitchen glucose blood (ACCU-CHEK AVIVA PLUS) test strip  CHECK SUGAR ONCE DAILY  . hydrALAZINE (APRESOLINE) 50 MG tablet TAKE ONE (1) TABLET THREE (3) TIMES EACH DAY  . metoprolol succinate (TOPROL-XL) 25 MG 24 hr tablet TAKE ONE (1) TABLET EACH DAY  . Multiple Vitamin (MULTIVITAMIN) tablet Take 1 tablet by mouth daily. Reported on 12/24/2015  . SYNTHROID 50 MCG tablet TAKE ONE (1) TABLET EACH DAY   No facility-administered encounter medications on file as of 03/10/2017.     Review of Systems  Constitutional: Negative for appetite change.       Some decreased weight.  Discussed adding nutritional supplements.   HENT: Positive for congestion. Negative for sinus pressure.   Respiratory: Negative for cough, chest tightness and shortness of breath.   Cardiovascular: Negative for chest pain, palpitations and leg swelling.  Gastrointestinal: Negative for abdominal pain, diarrhea, nausea and vomiting.  Genitourinary: Negative for difficulty urinating and dysuria.  Musculoskeletal: Negative for joint swelling and myalgias.  Skin: Negative for color change and rash.  Neurological: Negative for dizziness, light-headedness and headaches.  Psychiatric/Behavioral: Negative for agitation and dysphoric mood.       Objective:    Physical Exam  Constitutional: She  appears well-developed and well-nourished. No distress.  HENT:  Nose: Nose normal.  Mouth/Throat: Oropharynx is clear and moist.  Neck: Neck supple. No thyromegaly present.  Cardiovascular: Normal rate and regular rhythm.   Pulmonary/Chest: Breath sounds normal. No respiratory distress. She has no wheezes.  Abdominal: Soft. Bowel sounds are normal. There is no tenderness.  Musculoskeletal: She exhibits no edema or tenderness.  Lymphadenopathy:    She has no cervical adenopathy.  Skin: No rash noted. No erythema.  Psychiatric: She has a normal mood and affect. Her behavior is normal.    BP 140/62 (BP Location: Left Arm, Patient Position: Sitting, Cuff Size: Normal)   Pulse 65   Temp 98.3  F (36.8 C) (Oral)   Resp 12   Ht 4' 10"  (1.473 m)   Wt 89 lb 6.4 oz (40.6 kg)   SpO2 98%   BMI 18.68 kg/m  Wt Readings from Last 3 Encounters:  03/10/17 89 lb 6.4 oz (40.6 kg)  11/27/16 92 lb (41.7 kg)  11/06/16 91 lb (41.3 kg)     Lab Results  Component Value Date   WBC 6.3 03/10/2017   HGB 11.6 (L) 03/10/2017   HCT 35.9 (L) 03/10/2017   PLT 230.0 03/10/2017   GLUCOSE 125 (H) 03/10/2017   CHOL 209 (H) 06/21/2014   TRIG 105.0 06/21/2014   HDL 77.00 06/21/2014   LDLDIRECT 107.1 07/29/2013   LDLCALC 111 (H) 06/21/2014   ALT 12 03/10/2017   AST 19 03/10/2017   NA 141 03/10/2017   K 4.3 03/10/2017   CL 103 03/10/2017   CREATININE 1.02 03/10/2017   BUN 32 (H) 03/10/2017   CO2 29 03/10/2017   TSH 2.38 03/10/2017   INR 1.0 01/25/2013   HGBA1C 6.4 03/10/2017   MICROALBUR 115.0 Repeated and verified X2. (H) 07/29/2013    Dg Bone Density  Result Date: 01/07/2017 EXAM: DUAL X-RAY ABSORPTIOMETRY (DXA) FOR BONE MINERAL DENSITY IMPRESSION: Dear Dr. Einar Pheasant, Your patient Anaiya Wisinski completed a BMD test on 01/07/2017 using the [place] (analysis version: 14.10) manufactured by [employer]. The following summarizes the results of our evaluation. PATIENT BIOGRAPHICAL: Name: Lisel, Siegrist Patient ID: [id number] Birth Date: [birth year] Height: 58.0 in. Gender: Female Exam Date: 01/07/2017 Weight: 92.0 lbs. Indications: Advanced Age, Parent Hip Fracture, Postmenopausal, Recurrent Falls Fractures: Ankle, Right wrist Treatments: Calcium, Vitamin D ASSESSMENT: The BMD measured at Forearm Radius 33% is 0.635 g/cm2 with a T-score of -2.7. This patient is considered osteoporotic according to [facility]) criteria. Lumbar spine was not utilized due to advanced degenerative changes. Site Region Measured Measured WHO Young Adult BMD Date       Age      Classification T-score DualFemur Neck Right 01/07/2017 96.9 Osteopenia -2.1 0.745 g/cm2 Left Forearm Radius  33% 01/07/2017 96.9 Osteoporosis -2.7 0.635 g/cm2 World Health Organization [facility]) criteria for post-menopausal, Caucasian Women: Normal:       T-score at or above -1 SD Osteopenia:   T-score between -1 and -2.5 SD Osteoporosis: T-score at or below -2.5 SD RECOMMENDATIONS: [place] recommends that FDA-approved medical therapies be considered in postmenopausal women and men age 54 or older with a: 1. Hip or vertebral (clinical or morphometric) fracture. 2. T-score of < -2.5 at the spine or hip. 3. Ten-year fracture probability by FRAX of 3% or greater for hip fracture or 20% or greater for major osteoporotic fracture. All treatment decisions require clinical judgment and consideration of individual patient factors, including patient preferences,  co-morbidities, previous drug use, risk factors not captured in the FRAX model (e.g. falls, vitamin D deficiency, increased bone turnover, interval significant decline in bone density) and possible under - or over-estimation of fracture risk by FRAX. All patients should ensure an adequate intake of dietary calcium (1200 mg/d) and vitamin D (800 IU daily) unless contraindicated. FOLLOW-UP: People with diagnosed cases of osteoporosis or at high risk for fracture should have regular bone mineral density tests. For patients eligible for Medicare, routine testing is allowed once every 2 years. The testing frequency can be increased to one year for patients who have rapidly progressing disease, those who are receiving or discontinuing medical therapy to restore bone mass, or have additional risk factors. I have reviewed this report, and agree with the above findings. [facility] Radiology Electronically Signed   By: Earle Gell M.D.   On: 01/07/2017 13:33       Assessment & Plan:   Problem List Items Addressed This Visit    Anemia - Primary    Recheck cbc today.       Relevant Orders   CBC with Differential/Platelet (Completed)   Ferritin  (Completed)   Diabetes mellitus with peripheral vascular disease ([place])    Follow met b and a1c.        Relevant Orders   Hemoglobin A1c (Completed)   Basic metabolic panel (Completed)   Urinalysis, Routine w reflex microscopic (Completed)   Hypercholesteremia    Follow lipid panel.       Relevant Orders   Hepatic function panel (Completed)   Hypertension    Blood pressure under reasonable control.  Follow pressures.  Follow metabolic panel.       Hypothyroidism    On thyroid replacement.  Follow tsh.       Relevant Orders   TSH (Completed)   Loss of weight    Weight is down a couple of pounds.  Encourage nutritional supplements.  She reports she is eating well.  Follow.       Osteoporosis    Discussed bone density results.  Discussed treatment options.  She agreed to reclast.  Obtain labs.      Relevant Orders   VITAMIN D 25 Hydroxy (Vit-D Deficiency, Fractures) (Completed)   Peripheral vascular disease ([place])    Has been followed by vascular surgery.         Other Visit Diagnoses    Need for pneumococcal vaccination           Einar Pheasant, MD

## 2017-03-10 NOTE — Progress Notes (Signed)
Pre-visit discussion using our clinic review tool. No additional management support is needed unless otherwise documented below in the visit note.  

## 2017-03-11 ENCOUNTER — Telehealth: Payer: Self-pay | Admitting: Radiology

## 2017-03-11 LAB — BASIC METABOLIC PANEL
BUN: 32 mg/dL — ABNORMAL HIGH (ref 6–23)
CALCIUM: 9.8 mg/dL (ref 8.4–10.5)
CO2: 29 mEq/L (ref 19–32)
Chloride: 103 mEq/L (ref 96–112)
Creatinine, Ser: 1.02 mg/dL (ref 0.40–1.20)
GFR: 64.48 mL/min (ref >60.00)
GLUCOSE: 125 mg/dL — AB (ref 70–99)
POTASSIUM: 4.3 meq/L (ref 3.5–5.1)
SODIUM: 141 meq/L (ref 135–145)

## 2017-03-11 LAB — HEPATIC FUNCTION PANEL
ALBUMIN: 3.8 g/dL (ref 3.5–5.2)
ALT: 12 U/L (ref 0–35)
AST: 19 U/L (ref 0–37)
Alkaline Phosphatase: 34 U/L — ABNORMAL LOW (ref 39–117)
Bilirubin, Direct: 0.1 mg/dL (ref 0.0–0.3)
TOTAL PROTEIN: 7.1 g/dL (ref 6.0–8.3)
Total Bilirubin: 0.3 mg/dL (ref 0.2–1.2)

## 2017-03-11 LAB — VITAMIN D 25 HYDROXY (VIT D DEFICIENCY, FRACTURES): VITD: 106.96 ng/mL — AB (ref 30.00–100.00)

## 2017-03-11 NOTE — Telephone Encounter (Signed)
[facility] called with critical lab results for pt. Vitamin D level at 106.96

## 2017-03-11 NOTE — Telephone Encounter (Signed)
Pt's daughter Kendrick Fries notified of lab results including vitamin D results.  See result note.  Will hold vitamin D supplements.  Will follow.

## 2017-03-12 ENCOUNTER — Encounter: Payer: Self-pay | Admitting: Internal Medicine

## 2017-03-12 DIAGNOSIS — M810 Age-related osteoporosis without current pathological fracture: Secondary | ICD-10-CM | POA: Insufficient documentation

## 2017-03-12 NOTE — Assessment & Plan Note (Signed)
Follow lipid panel.   

## 2017-03-12 NOTE — Assessment & Plan Note (Signed)
Blood pressure under reasonable control.  Follow pressures.  Follow metabolic panel.  

## 2017-03-12 NOTE — Assessment & Plan Note (Signed)
Follow met b and a1c.

## 2017-03-12 NOTE — Assessment & Plan Note (Signed)
Weight is down a couple of pounds.  Encourage nutritional supplements.  She reports she is eating well.  Follow.

## 2017-03-12 NOTE — Assessment & Plan Note (Signed)
Discussed bone density results.  Discussed treatment options.  She agreed to reclast.  Obtain labs.

## 2017-03-12 NOTE — Assessment & Plan Note (Signed)
Recheck cbc today.   

## 2017-03-12 NOTE — Assessment & Plan Note (Signed)
Has been followed by vascular surgery.  

## 2017-03-12 NOTE — Assessment & Plan Note (Signed)
On thyroid replacement.  Follow tsh.  

## 2017-03-16 DIAGNOSIS — B351 Tinea unguium: Secondary | ICD-10-CM | POA: Diagnosis not present

## 2017-03-16 DIAGNOSIS — E119 Type 2 diabetes mellitus without complications: Secondary | ICD-10-CM | POA: Diagnosis not present

## 2017-04-02 NOTE — Telephone Encounter (Signed)
error 

## 2017-05-06 ENCOUNTER — Other Ambulatory Visit: Payer: Self-pay | Admitting: Internal Medicine

## 2017-05-06 DIAGNOSIS — H26491 Other secondary cataract, right eye: Secondary | ICD-10-CM | POA: Diagnosis not present

## 2017-05-06 LAB — HM DIABETES EYE EXAM

## 2017-05-20 ENCOUNTER — Ambulatory Visit (INDEPENDENT_AMBULATORY_CARE_PROVIDER_SITE_OTHER): Payer: Medicare Other

## 2017-05-20 DIAGNOSIS — Z23 Encounter for immunization: Secondary | ICD-10-CM | POA: Diagnosis not present

## 2017-05-22 ENCOUNTER — Encounter: Payer: Self-pay | Admitting: Internal Medicine

## 2017-05-22 NOTE — Progress Notes (Unsigned)
Documented reason for not being on ACE/ ARB.

## 2017-05-25 ENCOUNTER — Other Ambulatory Visit: Payer: Self-pay | Admitting: Internal Medicine

## 2017-06-05 ENCOUNTER — Other Ambulatory Visit: Payer: Self-pay | Admitting: Internal Medicine

## 2017-06-17 ENCOUNTER — Ambulatory Visit (INDEPENDENT_AMBULATORY_CARE_PROVIDER_SITE_OTHER): Payer: Medicare Other | Admitting: Family Medicine

## 2017-06-17 ENCOUNTER — Encounter: Payer: Self-pay | Admitting: Family Medicine

## 2017-06-17 VITALS — BP 166/60 | HR 75 | Temp 97.9°F | Ht <= 58 in | Wt 91.0 lb

## 2017-06-17 DIAGNOSIS — J309 Allergic rhinitis, unspecified: Secondary | ICD-10-CM | POA: Diagnosis not present

## 2017-06-17 MED ORDER — FEXOFENADINE HCL 180 MG PO TABS: 180.0000 mg | ORAL_TABLET | Freq: Every day | ORAL | 3 refills | Status: DC

## 2017-06-17 MED ORDER — FLUTICASONE PROPIONATE 50 MCG/ACT NA SUSP: 2.0000 | Freq: Every day | NASAL | 6 refills | Status: DC

## 2017-06-17 NOTE — Patient Instructions (Signed)
Great to meet you. Please stop using the Atrovent nasal spray. Restart Allegra and flonase. Please follow up with your doctor if there is no improvement.

## 2017-06-17 NOTE — Progress Notes (Signed)
Subjective:   Patient ID: Tara [name], female    DOB: 09/11/1917, 83 y.o.   MRN: [id number]  Tara [name] is a pleasant 83 y.o. year old female who presents to clinic today with Nasal Congestion (Patient is here today C/O of nasal congestion that has been going on since March.  Nasal drainage is clear.  Has an intermittent cough that is occas productive with white sputum.)  on 06/17/2017  HPI: Pt is new to me.  Clear nasal drainage since March (7 months). Intermittent, sometimes productive cough.  No CP or SOB.  No fevers.  Not taking allegra or flonase, she is using atrovent nasal spray.   Current Outpatient Medications on File Prior to Visit  Medication Sig Dispense Refill  . acetaminophen (TYLENOL) 325 MG tablet Take 650 mg by mouth every 6 (six) hours as needed.    Marland Kitchen amLODipine (NORVASC) 5 MG tablet Take 1 tablet (5 mg total) by mouth 2 (two) times daily. 60 tablet 5  . aspirin 81 MG tablet Take 81 mg by mouth daily.    . Calcium Carbonate-Vitamin D (CALTRATE 600+D PO) Take by mouth.    . Cholecalciferol (D3 SUPER STRENGTH) 2000 UNITS CAPS Take by mouth.    . fexofenadine (ALLEGRA) 180 MG tablet Take 180 mg by mouth daily.    Marland Kitchen glucose blood (ACCU-CHEK AVIVA PLUS) test strip CHECK SUGAR ONCE DAILY 100 each 6  . hydrALAZINE (APRESOLINE) 50 MG tablet TAKE ONE (1) TABLET THREE (3) TIMES EACH DAY 90 tablet 2  . ipratropium (ATROVENT HFA) 17 MCG/ACT inhaler Inhale 1 puff every 6 (six) hours as needed into the lungs for wheezing.    Marland Kitchen ipratropium (ATROVENT) 0.03 % nasal spray Place 2 sprays every 12 (twelve) hours into both nostrils.    . metoprolol succinate (TOPROL-XL) 25 MG 24 hr tablet TAKE ONE (1) TABLET EACH DAY 30 tablet 3  . Multiple Vitamin (MULTIVITAMIN) tablet Take 1 tablet by mouth daily. Reported on 12/24/2015    . SYNTHROID 50 MCG tablet TAKE ONE (1) TABLET EACH DAY 90 tablet 2   No current facility-administered medications on file prior to visit.     Allergies    Allergen Reactions  . Ace Inhibitors Other (See Comments)    Angioedema  . Hydrochlorothiazide W-Triamterene Other (See Comments)    Gout flare  . Lisinopril Swelling and Other (See Comments)  . Penicillins Swelling  . Trichlormethiazide Other (See Comments)    Gout flare    Past Medical History:  Diagnosis Date  . Asthma   . Diabetes mellitus ([place])   . Environmental allergies   . Hypertension     Past Surgical History:  Procedure Laterality Date  . APPENDECTOMY    . TUBAL LIGATION      Family History  Problem Relation Age of Onset  . Hypertension Mother   . Cancer Brother        lung (question)  . Cancer Sister        questionable type    Social History   Socioeconomic History  . Marital status: Widowed    Spouse name: Not on file  . Number of children: Not on file  . Years of education: Not on file  . Highest education level: Not on file  Social Needs  . Financial resource strain: Not on file  . Food insecurity - worry: Not on file  . Food insecurity - inability: Not on file  . Transportation needs - medical: Not on file  .  Transportation needs - non-medical: Not on file  Occupational History  . Not on file  Tobacco Use  . Smoking status: Never Smoker  . Smokeless tobacco: Never Used  Substance and Sexual Activity  . Alcohol use: No    Alcohol/week: 0.0 oz  . Drug use: No  . Sexual activity: No  Other Topics Concern  . Not on file  Social History Narrative  . Not on file   The PMH, PSH, Social History, Family History, Medications, and allergies have been reviewed in [facility], and have been updated if relevant.   Review of Systems  Constitutional: Negative.   HENT: Positive for congestion, postnasal drip and rhinorrhea. Negative for dental problem, drooling, ear discharge, ear pain, facial swelling, hearing loss, mouth sores, nosebleeds, sinus pressure, sinus pain, sneezing, sore throat, tinnitus, trouble swallowing and voice change.   Respiratory:  Positive for cough. Negative for shortness of breath, wheezing and stridor.   Cardiovascular: Negative.   [name] other systems reviewed and are negative.      Objective:    BP (!) 166/60 (BP Location: Left Arm, Patient Position: Sitting, Cuff Size: Normal)   Pulse 75   Temp 97.9 F (36.6 C) (Oral)   Ht 4\' 10"  (1.473 m)   Wt 91 lb (41.3 kg)   SpO2 97%   BMI 19.02 kg/m    Physical Exam  Constitutional: She is oriented to person, place, and time. She appears well-developed and well-nourished. No distress.  HENT:  Head: Normocephalic and atraumatic.  Right Ear: Hearing and tympanic membrane normal.  Left Ear: Hearing and tympanic membrane normal.  Nose: Mucosal edema and rhinorrhea present. Right sinus exhibits no maxillary sinus tenderness and no frontal sinus tenderness. Left sinus exhibits no frontal sinus tenderness.  Eyes: Conjunctivae are normal.  Cardiovascular: Normal rate and regular rhythm.  Pulmonary/Chest: Effort normal and breath sounds normal.  Musculoskeletal: Normal range of motion.  Neurological: She is alert and oriented to person, place, and time. No cranial nerve deficit.  Skin: Skin is warm and dry.  Psychiatric: She has a normal mood and affect. Her behavior is normal. Judgment and thought content normal.  Nursing note reviewed.         Assessment & Plan:   Allergic rhinitis, unspecified seasonality, unspecified trigger No Follow-up on file.

## 2017-06-17 NOTE — Assessment & Plan Note (Signed)
Deteriorated. ? If she had better results with allegra and flonase. Advised to STOP taking Atrovent, restart allegra and flonase. Call or return to clinic prn if these symptoms worsen or fail to improve as anticipated. The patient indicates understanding of these issues and agrees with the plan.

## 2017-06-26 ENCOUNTER — Emergency Department
Admission: EM | Admit: 2017-06-26 | Discharge: 2017-06-27 | Disposition: A | Discharge status: Home / Self Care | Payer: Medicare Other | Attending: Emergency Medicine | Admitting: Emergency Medicine

## 2017-06-26 ENCOUNTER — Other Ambulatory Visit: Payer: Self-pay

## 2017-06-26 ENCOUNTER — Emergency Department: Payer: Medicare Other

## 2017-06-26 DIAGNOSIS — K4130 Unilateral femoral hernia, with obstruction, without gangrene, not specified as recurrent: Secondary | ICD-10-CM | POA: Insufficient documentation

## 2017-06-26 DIAGNOSIS — J45909 Unspecified asthma, uncomplicated: Secondary | ICD-10-CM | POA: Insufficient documentation

## 2017-06-26 DIAGNOSIS — E039 Hypothyroidism, unspecified: Secondary | ICD-10-CM | POA: Insufficient documentation

## 2017-06-26 DIAGNOSIS — Z79899 Other long term (current) drug therapy: Secondary | ICD-10-CM | POA: Insufficient documentation

## 2017-06-26 DIAGNOSIS — K4131 Unilateral femoral hernia, with obstruction, without gangrene, recurrent: Secondary | ICD-10-CM | POA: Diagnosis not present

## 2017-06-26 DIAGNOSIS — K56600 Partial intestinal obstruction, unspecified as to cause: Secondary | ICD-10-CM | POA: Diagnosis not present

## 2017-06-26 DIAGNOSIS — Z7982 Long term (current) use of aspirin: Secondary | ICD-10-CM | POA: Insufficient documentation

## 2017-06-26 DIAGNOSIS — R9431 Abnormal electrocardiogram [ECG] [EKG]: Secondary | ICD-10-CM | POA: Diagnosis not present

## 2017-06-26 DIAGNOSIS — K56699 Other intestinal obstruction unspecified as to partial versus complete obstruction: Secondary | ICD-10-CM | POA: Diagnosis not present

## 2017-06-26 DIAGNOSIS — I10 Essential (primary) hypertension: Secondary | ICD-10-CM | POA: Diagnosis not present

## 2017-06-26 DIAGNOSIS — R109 Unspecified abdominal pain: Secondary | ICD-10-CM | POA: Diagnosis not present

## 2017-06-26 DIAGNOSIS — E119 Type 2 diabetes mellitus without complications: Secondary | ICD-10-CM | POA: Insufficient documentation

## 2017-06-26 LAB — CBC
HCT: 37.4 % (ref 35.0–47.0)
HEMOGLOBIN: 12.2 g/dL (ref 12.0–16.0)
MCH: 30.3 pg (ref 26.0–34.0)
MCHC: 32.6 g/dL (ref 32.0–36.0)
MCV: 92.9 fL (ref 80.0–100.0)
PLATELETS: 236 10*3/uL (ref 150–440)
RBC: 4.03 MIL/uL (ref 3.80–5.20)
RDW: 15.6 % — ABNORMAL HIGH (ref 11.5–14.5)
WBC: 5.6 10*3/uL (ref 3.6–11.0)

## 2017-06-26 LAB — TROPONIN I: TROPONIN I: 0.03 ng/mL — AB (ref <0.03)

## 2017-06-26 LAB — COMPREHENSIVE METABOLIC PANEL
ALK PHOS: 47 U/L (ref 38–126)
ALT: 18 U/L (ref 14–54)
ANION GAP: 10 (ref 5–15)
AST: 30 U/L (ref 15–41)
Albumin: 3.9 g/dL (ref 3.5–5.0)
BILIRUBIN TOTAL: 0.6 mg/dL (ref 0.3–1.2)
BUN: 27 mg/dL — ABNORMAL HIGH (ref 6–20)
CALCIUM: 9.4 mg/dL (ref 8.9–10.3)
CO2: 25 mmol/L (ref 22–32)
CREATININE: 0.85 mg/dL (ref 0.44–1.00)
Chloride: 106 mmol/L (ref 101–111)
GFR calc non Af Amer: 56 mL/min — ABNORMAL LOW (ref >60)
GLUCOSE: 168 mg/dL — AB (ref 65–99)
Potassium: 3.7 mmol/L (ref 3.5–5.1)
Sodium: 141 mmol/L (ref 135–145)
TOTAL PROTEIN: 8.2 g/dL — AB (ref 6.5–8.1)

## 2017-06-26 LAB — LACTIC ACID, PLASMA: LACTIC ACID, VENOUS: 0.9 mmol/L (ref 0.5–1.9)

## 2017-06-26 LAB — LIPASE, BLOOD: Lipase: 40 U/L (ref 11–51)

## 2017-06-26 MED ORDER — MORPHINE SULFATE (PF) 2 MG/ML IV SOLN
2.0000 mg | Freq: Once | INTRAVENOUS | Status: AC
  Administered 2017-06-26: 2 mg via INTRAVENOUS
  Filled 2017-06-26: qty 1

## 2017-06-26 MED ORDER — IOPAMIDOL (ISOVUE-300) INJECTION 61%
75.0000 mL | Freq: Once | INTRAVENOUS | Status: AC | PRN
  Administered 2017-06-26: 75 mL via INTRAVENOUS

## 2017-06-26 NOTE — ED Notes (Addendum)
ED student PA at bedside.

## 2017-06-26 NOTE — Discharge Instructions (Signed)
Please follow up with your primary care physician this coming Monday for a recheck.  Return to the ED for any concerns whatsoever.  It was a pleasure to take care of you today, and thank you for coming to our emergency department.  If you have any questions or concerns before leaving please ask the nurse to grab me and I'm more than happy to go through your aftercare instructions again.  If you were prescribed any opioid pain medication today such as Norco, Vicodin, Percocet, morphine, hydrocodone, or oxycodone please make sure you do not drive when you are taking this medication as it can alter your ability to drive safely.  If you have any concerns once you are home that you are not improving or are in fact getting worse before you can make it to your follow-up appointment, please do not hesitate to call 911 and come back for further evaluation.  Darel Hong, MD  Results for orders placed or performed during the hospital encounter of 06/26/17  Lipase, blood  Result Value Ref Range   Lipase 40 11 - 51 U/L  Comprehensive metabolic panel  Result Value Ref Range   Sodium 141 135 - 145 mmol/L   Potassium 3.7 3.5 - 5.1 mmol/L   Chloride 106 101 - 111 mmol/L   CO2 25 22 - 32 mmol/L   Glucose, Bld 168 (H) 65 - 99 mg/dL   BUN 27 (H) 6 - 20 mg/dL   Creatinine, Ser 0.85 0.44 - 1.00 mg/dL   Calcium 9.4 8.9 - 10.3 mg/dL   Total Protein 8.2 (H) 6.5 - 8.1 g/dL   Albumin 3.9 3.5 - 5.0 g/dL   AST 30 15 - 41 U/L   ALT 18 14 - 54 U/L   Alkaline Phosphatase 47 38 - 126 U/L   Total Bilirubin 0.6 0.3 - 1.2 mg/dL   GFR calc non Af Amer 56 (L) >60 mL/min   GFR calc Af Amer >60 >60 mL/min   Anion gap 10 5 - 15  CBC  Result Value Ref Range   WBC 5.6 3.6 - 11.0 K/uL   RBC 4.03 3.80 - 5.20 MIL/uL   Hemoglobin 12.2 12.0 - 16.0 g/dL   HCT 37.4 35.0 - 47.0 %   MCV 92.9 80.0 - 100.0 fL   MCH 30.3 26.0 - 34.0 pg   MCHC 32.6 32.0 - 36.0 g/dL   RDW 15.6 (H) 11.5 - 14.5 %   Platelets 236 150 - 440 K/uL    Lactic acid, plasma  Result Value Ref Range   Lactic Acid, Venous 0.9 0.5 - 1.9 mmol/L  Troponin I  Result Value Ref Range   Troponin I 0.03 (HH) <0.03 ng/mL   Ct Abdomen Pelvis W Contrast  Result Date: 06/26/2017 CLINICAL DATA:  Abdominal pain EXAM: CT ABDOMEN AND PELVIS WITH CONTRAST TECHNIQUE: Multidetector CT imaging of the abdomen and pelvis was performed using the standard protocol following bolus administration of intravenous contrast. CONTRAST:  56mL ISOVUE-300 IOPAMIDOL (ISOVUE-300) INJECTION 61% COMPARISON:  10/19/2013, 06/17/2011 FINDINGS: Lower chest: Lung bases demonstrate no consolidation or effusion. Cardiomegaly. Hepatobiliary: Subcentimeter hypodense liver lesions, too small to further characterizebut several of which were present on comparison study from 2015. Dilated gallbladder containing small stones. No biliary dilatation Pancreas: Unremarkable. No inflammatory changes. Stable ductal dilatation at the head of pancreas. Spleen: Normal in size without focal abnormality. Adrenals/Urinary Tract: Adrenal gland are within normal limits. Kidneys show no hydronephrosis. Multiple subcentimeter cortical hypodense lesions too small to further characterize.  Bladder normal. Stomach/Bowel: The stomach is slightly fluid-filled. Questionable mild thickening and edema at the pylorus, series 2, image number 32. Multiple fluid-filled but not significantly enlarged loops of small bowel in the left abdomen. This appears contiguous with a right groin hernia. The bowel distal to this is nonenlarged. No significant colon wall thickening. Vascular/Lymphatic: Extensive atherosclerotic calcification of the aorta. No aneurysmal dilatation. No significantly enlarged lymph nodes Reproductive: Uterus and bilateral adnexa are unremarkable. Other: Negative for free air.  Trace free fluid in the pelvis. Musculoskeletal: Degenerative changes of the spine. No acute or suspicious lesion. Old right inferior pubic ramus  fracture. Avascular necrosis of the left femoral head. IMPRESSION: 1. Few fluid filled but not significantly enlarged loops of distal small bowel in the left hemiabdomen, appears contiguous with a right groin hernia, findings could be secondary to a low-grade obstruction. 2. Dilated gallbladder containing multiple calcified stones 3. Possible edema or wall thickening at the pylorus, consider gastritis or peptic ulcer disease. No free air. Electronically Signed   By: Donavan Foil M.D.   On: 06/26/2017 23:16

## 2017-06-26 NOTE — ED Provider Notes (Signed)
[facility] Emergency Department Provider Note  ____________________________________________   First MD Initiated Contact with Patient 06/26/17 2139     (approximate)  I have reviewed the triage vital signs and the nursing notes.   HISTORY  Chief Complaint Abdominal Pain   HPI Tara [name] is a 83 y.o. female comes to the emergency department with severe diffuse constant abdominal pain that began suddenly at 5 PM today.  Associated with nausea but no vomiting.  Normal bowel movements and flatus.  She has a past surgical history of appendectomy and tubal ligation.  Nothing seems to make her pain better or worse.  Past Medical History:  Diagnosis Date  . Asthma   . Diabetes mellitus ([place])   . Environmental allergies   . Hypertension     Patient Active Problem List   Diagnosis Date Noted  . Allergic rhinitis 06/17/2017  . Osteoporosis 03/12/2017  . Nasal obstruction 09/03/2016  . Loss of weight 03/05/2015  . Unsteady gait 07/31/2013  . Lower extremity edema 05/15/2013  . Peripheral vascular disease ([place]) 08/29/2012  . Hypertension 06/11/2012  . Hypercholesteremia 06/11/2012  . Diabetes mellitus with peripheral vascular disease ([place]) 06/11/2012  . Anemia 06/11/2012  . B12 deficiency 06/11/2012  . Hypothyroidism 06/11/2012    Past Surgical History:  Procedure Laterality Date  . APPENDECTOMY    . TUBAL LIGATION      Prior to Admission medications   Medication Sig Start Date End Date Taking? Authorizing Provider  acetaminophen (TYLENOL) 325 MG tablet Take 650 mg by mouth every 6 (six) hours as needed.    [provider]  amLODipine (NORVASC) 5 MG tablet Take 1 tablet (5 mg total) by mouth 2 (two) times daily. 11/27/16   Einar Pheasant, MD  aspirin 81 MG tablet Take 81 mg by mouth daily.    [provider]  Calcium Carbonate-Vitamin D (CALTRATE 600+D PO) Take by mouth.    [provider]  Cholecalciferol (D3 SUPER  STRENGTH) 2000 UNITS CAPS Take by mouth.    [provider]  fexofenadine (ALLEGRA) 180 MG tablet Take 1 tablet (180 mg total) daily by mouth. 06/17/17   Lucille Passy, MD  fluticasone (FLONASE) 50 MCG/ACT nasal spray Place 2 sprays daily into both nostrils. 06/17/17   Lucille Passy, MD  glucose blood (ACCU-CHEK AVIVA PLUS) test strip CHECK SUGAR ONCE DAILY 09/24/15   Einar Pheasant, MD  hydrALAZINE (APRESOLINE) 50 MG tablet TAKE ONE (1) TABLET THREE (3) TIMES EACH DAY 11/27/16   Einar Pheasant, MD  ipratropium (ATROVENT HFA) 17 MCG/ACT inhaler Inhale 1 puff every 6 (six) hours as needed into the lungs for wheezing.    [provider]  metoprolol succinate (TOPROL-XL) 25 MG 24 hr tablet TAKE ONE (1) TABLET EACH DAY 05/25/17   Einar Pheasant, MD  Multiple Vitamin (MULTIVITAMIN) tablet Take 1 tablet by mouth daily. Reported on 12/24/2015    [provider]  SYNTHROID 50 MCG tablet TAKE ONE (1) TABLET EACH DAY 11/27/16   Einar Pheasant, MD    Allergies Ace inhibitors; Hydrochlorothiazide w-triamterene; Lisinopril; Penicillins; and Trichlormethiazide  Family History  Problem Relation Age of Onset  . Hypertension Mother   . Cancer Brother        lung (question)  . Cancer Sister        questionable type    Social History Social History   Tobacco Use  . Smoking status: Never Smoker  . Smokeless tobacco: Never Used  Substance Use Topics  .  Alcohol use: No    Alcohol/week: 0.0 oz  . Drug use: No    Review of Systems Constitutional: No fever/chills Eyes: No visual changes. ENT: No sore throat. Cardiovascular: Denies chest pain. Respiratory: Denies shortness of breath. Gastrointestinal: Positive for abdominal pain.  Positive for nausea, no vomiting.  No diarrhea.  No constipation. Genitourinary: Negative for dysuria. Musculoskeletal: Negative for back pain. Skin: Negative for rash. Neurological: Negative for headaches, focal weakness or  numbness.   ____________________________________________   PHYSICAL EXAM:  VITAL SIGNS: ED Triage Vitals [06/26/17 2115]  Enc Vitals Group     BP (!) 194/83     Pulse Rate 74     Resp 18     Temp 98.9 F (37.2 C)     Temp Source Oral     SpO2 100 %     Weight 91 lb (41.3 kg)     Height 4\' 9"  (1.448 m)     Head Circumference      Peak Flow      Pain Score 10     Pain Loc      Pain Edu?      Excl. in [place]?     Constitutional: Alert and oriented x4 joking laughing pleasant cooperative speaks in full clear sentences no diaphoresis Eyes: PERRL EOMI. Head: Atraumatic. Nose: No congestion/rhinnorhea. Mouth/Throat: No trismus Neck: No stridor.   Cardiovascular: Normal rate, regular rhythm. Grossly normal heart sounds.  Good peripheral circulation. Respiratory: Normal respiratory effort.  No retractions. Lungs CTAB and moving good air Gastrointestinal: Flat abdomen diffusely tender to palpation with no focality no rebound or guarding no frank peritonitis Musculoskeletal: No lower extremity edema   Neurologic:  Normal speech and language. No gross focal neurologic deficits are appreciated. Skin:  Skin is warm, dry and intact. No rash noted. Psychiatric: Mood and affect are normal. Speech and behavior are normal.    ____________________________________________   DIFFERENTIAL includes but not limited to  Diverticulitis, volvulus, small bowel obstruction, large bowel obstruction, cancer ____________________________________________   LABS (all labs ordered are listed, but only abnormal results are displayed)  Labs Reviewed  COMPREHENSIVE METABOLIC PANEL - Abnormal; Notable for the following components:      Result Value   Glucose, Bld 168 (*)    BUN 27 (*)    Total Protein 8.2 (*)    GFR calc non Af Amer 56 (*)    All other components within normal limits  CBC - Abnormal; Notable for the following components:   RDW 15.6 (*)    All other components within normal limits   TROPONIN I - Abnormal; Notable for the following components:   Troponin I 0.03 (*)    All other components within normal limits  LIPASE, BLOOD  LACTIC ACID, PLASMA  URINALYSIS, COMPLETE (UACMP) WITH MICROSCOPIC  LACTIC ACID, PLASMA    Blood work reviewed and interpreted by me shows normal lactate.  Slightly elevated troponin of unclear clinical significance likely secondary to demand __________________________________________  EKG  ED ECG REPORT I, Darel Hong, the attending physician, personally viewed and interpreted this ECG.  Date: 06/26/2017 EKG Time:  Rate: 68 Rhythm: normal sinus rhythm QRS Axis: normal Intervals: normal ST/T Wave abnormalities: normal Narrative Interpretation: no evidence of acute ischemia  ____________________________________________  RADIOLOGY  CT scan abdomen pelvis consistent with right femoral hernia and partial small bowel obstruction ____________________________________________   PROCEDURES  Procedure(s) performed: no  Procedures  Critical Care performed: no  Observation: no ____________________________________________   INITIAL IMPRESSION / ASSESSMENT  AND PLAN / ED COURSE  Pertinent labs & imaging results that were available during my care of the patient were reviewed by me and considered in my medical decision making (see chart for details).  The patient arrives somewhat uncomfortable appearing with a diffusely tender abdomen.  Differential 83 year old woman is extremely broad so I will check labs including a lactate and a troponin and she will require a CT scan with IV contrast.    I discussed the case with on-call general surgeon Dr. Rosana Hoes who recommended reduction of the patient's hernia.  She is not a good surgical candidate.  I went to the patient's bedside and discussed with the patient and family and was able to easily reduce her femoral hernia without difficulty.  The patient's pain immediately improved.  She was  subsequently able to eat and drink.  At this point there is no indication for inpatient admission and the patient is stable for discharge home.  Her daughter will spend the night in her home tonight.  Strict return precautions have been given and the patient verbalizes understanding and agreement the plan.  ____________________________________________   FINAL CLINICAL IMPRESSION(S) / ED DIAGNOSES  Final diagnoses:  Non-recurrent unilateral femoral hernia with obstruction without gangrene  Partial small bowel obstruction (HCC)      NEW MEDICATIONS STARTED DURING THIS VISIT:  This SmartLink is deprecated. Use AVSMEDLIST instead to display the medication list for a patient.   Note:  This document was prepared using Dragon voice recognition software and may include unintentional dictation errors.     Darel Hong, MD 06/27/17 [phone]

## 2017-06-26 NOTE — ED Triage Notes (Signed)
Pt arrives to ED via POV from home with c/o generalized abdominal pain that started today. Pt denies N/V/D or fever; no CP or SHOB. Pt reports last BM was today and "a little, but not much". Pt denies h/x of similar abdominal pain in the past. Pt is A&O, in NAD; RR even, regular, and unlabored; skin color/temp is WNL.

## 2017-06-26 NOTE — ED Notes (Signed)
Pt has had an upset stomach since 1700 today. She lives alone and is here with her daughter and granddaughter. Pt is a good historian and has no significant medical history per her interview

## 2017-06-26 NOTE — ED Notes (Signed)
Patient transported to CT 

## 2017-06-26 NOTE — ED Notes (Signed)
ED Provider at bedside. 

## 2017-06-26 NOTE — ED Notes (Signed)
Date and time results received: 06/26/17 2255 (use smartphrase ".now" to insert current time)  Test: troponin Critical Value: 0.03  Name of Provider Notified: [place]  Orders Received? Or Actions Taken?: aware

## 2017-06-29 ENCOUNTER — Telehealth: Payer: Self-pay

## 2017-06-29 NOTE — Telephone Encounter (Signed)
Patient and family requesting ED follow up I have copied ER note below.   The patient arrives somewhat uncomfortable appearing with a diffusely tender abdomen.  Differential 83 year old woman is extremely broad so I will check labs including a lactate and a troponin and she will require a CT scan with IV contrast.  I discussed the case with on-call general surgeon Dr. Rosana Hoes who recommended reduction of the patient's hernia.  She is not a good surgical candidate.  I went to the patient's bedside and discussed with the patient and family and was able to easily reduce her femoral hernia without difficulty.  The patient's pain immediately improved.  She was subsequently able to eat and drink.  At this point there is no indication for inpatient admission and the patient is stable for discharge home.  Her daughter will spend the night in her home tonight.  Strict return precautions have been given and the patient verbalizes understanding and agreement the plan.

## 2017-06-29 NOTE — Telephone Encounter (Signed)
Copied from [place] [id number]. Topic: Appointment Scheduling - Scheduling Inquiry for Clinic >> Jun 29, 2017  1:27 PM Conception Chancy, NT wrote: Reason for CRM: patient was at the hospital 11/16 and needs a hospital f/u appt. Family is aware Juliann Pulse will be giving her a call.

## 2017-06-30 ENCOUNTER — Ambulatory Visit (INDEPENDENT_AMBULATORY_CARE_PROVIDER_SITE_OTHER): Payer: Medicare Other | Admitting: Internal Medicine

## 2017-06-30 ENCOUNTER — Encounter: Payer: Self-pay | Admitting: Internal Medicine

## 2017-06-30 DIAGNOSIS — E039 Hypothyroidism, unspecified: Secondary | ICD-10-CM

## 2017-06-30 DIAGNOSIS — K4090 Unilateral inguinal hernia, without obstruction or gangrene, not specified as recurrent: Secondary | ICD-10-CM

## 2017-06-30 DIAGNOSIS — I10 Essential (primary) hypertension: Secondary | ICD-10-CM | POA: Diagnosis not present

## 2017-06-30 DIAGNOSIS — E1151 Type 2 diabetes mellitus with diabetic peripheral angiopathy without gangrene: Secondary | ICD-10-CM

## 2017-06-30 DIAGNOSIS — J3489 Other specified disorders of nose and nasal sinuses: Secondary | ICD-10-CM

## 2017-06-30 DIAGNOSIS — R935 Abnormal findings on diagnostic imaging of other abdominal regions, including retroperitoneum: Secondary | ICD-10-CM

## 2017-06-30 NOTE — Patient Instructions (Signed)
Zantac (ranitidine) 150mg  - take one tablet 30 minutes before your evening meal.  If you forget, take before bed.

## 2017-06-30 NOTE — Telephone Encounter (Addendum)
Patient scheduled FYI, patient aware of appointment date and time.

## 2017-06-30 NOTE — Telephone Encounter (Signed)
Confirm doing ok.  See if she can come at 2:00 06/30/17 (Tuesday).

## 2017-06-30 NOTE — Progress Notes (Signed)
Patient ID: Tara [name], female   DOB: 06/17/1918, 83 y.o.   MRN: [id number]   Subjective:    Patient ID: Tara [name], female    DOB: Mar 02, 1918, 83 y.o.   MRN: [id number]  HPI  Patient here for ER follow up.  Was seen 06/26/17 for diffuse abdominal pain.  She is s/p reduction of femoral hernia.  After reduction of her hernia, pain subsided.  She was discharged with plans for f/u as outpatient.  She states she feels better.  Pain is better.  Eating.  Bowels are moving.  No urine change.  Not felt to be a good surgical candidate.  CT reviewed - dilated gallbladder containing multiple calcified stones and possible edema or wall thickening at the pylorus (consider gastritis or PUD).  Discussed scan results.     Past Medical History:  Diagnosis Date  . Asthma   . Diabetes mellitus ([place])   . Environmental allergies   . Hypertension    Past Surgical History:  Procedure Laterality Date  . APPENDECTOMY    . TUBAL LIGATION     Family History  Problem Relation Age of Onset  . Hypertension Mother   . Cancer Brother        lung (question)  . Cancer Sister        questionable type   Social History   Socioeconomic History  . Marital status: Widowed    Spouse name: None  . Number of children: None  . Years of education: None  . Highest education level: None  Social Needs  . Financial resource strain: None  . Food insecurity - worry: None  . Food insecurity - inability: None  . Transportation needs - medical: None  . Transportation needs - non-medical: None  Occupational History  . None  Tobacco Use  . Smoking status: Never Smoker  . Smokeless tobacco: Never Used  Substance and Sexual Activity  . Alcohol use: No    Alcohol/week: 0.0 oz  . Drug use: No  . Sexual activity: No  Other Topics Concern  . None  Social History Narrative  . None    Outpatient Encounter Medications as of 06/30/2017  Medication Sig  . acetaminophen (TYLENOL) 325 MG tablet Take 650 mg by mouth  every 6 (six) hours as needed.  Marland Kitchen amLODipine (NORVASC) 5 MG tablet Take 1 tablet (5 mg total) by mouth 2 (two) times daily.  Marland Kitchen aspirin 81 MG tablet Take 81 mg by mouth daily.  . Calcium Carbonate-Vitamin D (CALTRATE 600+D PO) Take by mouth.  . Cholecalciferol (D3 SUPER STRENGTH) 2000 UNITS CAPS Take by mouth.  . fexofenadine (ALLEGRA) 180 MG tablet Take 1 tablet (180 mg total) daily by mouth.  . fluticasone (FLONASE) 50 MCG/ACT nasal spray Place 2 sprays daily into both nostrils.  Marland Kitchen glucose blood (ACCU-CHEK AVIVA PLUS) test strip CHECK SUGAR ONCE DAILY  . hydrALAZINE (APRESOLINE) 50 MG tablet TAKE ONE (1) TABLET THREE (3) TIMES EACH DAY  . ipratropium (ATROVENT HFA) 17 MCG/ACT inhaler Inhale 1 puff every 6 (six) hours as needed into the lungs for wheezing.  . metoprolol succinate (TOPROL-XL) 25 MG 24 hr tablet TAKE ONE (1) TABLET EACH DAY  . Multiple Vitamin (MULTIVITAMIN) tablet Take 1 tablet by mouth daily. Reported on 12/24/2015  . SYNTHROID 50 MCG tablet TAKE ONE (1) TABLET EACH DAY   No facility-administered encounter medications on file as of 06/30/2017.     Review of Systems  Constitutional: Negative for appetite change and unexpected weight  change.  HENT: Negative for sinus pressure.        Congestion better.  Able to breathe better through her nose.    Respiratory: Negative for cough, chest tightness and shortness of breath.   Cardiovascular: Negative for chest pain, palpitations and leg swelling.  Gastrointestinal: Positive for abdominal pain. Negative for diarrhea, nausea and vomiting.  Genitourinary: Negative for difficulty urinating and dysuria.  Musculoskeletal: Negative for joint swelling and myalgias.  Skin: Negative for rash.  Neurological: Negative for dizziness and headaches.  Psychiatric/Behavioral: Negative for agitation and dysphoric mood.       Objective:    Physical Exam  Constitutional: She appears well-developed and well-nourished. No distress.  HENT:    Nose: Nose normal.  Mouth/Throat: Oropharynx is clear and moist.  Neck: Neck supple. No thyromegaly present.  Cardiovascular: Normal rate and regular rhythm.  Pulmonary/Chest: Breath sounds normal. No respiratory distress. She has no wheezes.  Abdominal: Soft. Bowel sounds are normal.  Minimal tenderness right lower quadrant.    Musculoskeletal: She exhibits no edema or tenderness.  Lymphadenopathy:    She has no cervical adenopathy.  Skin: No rash noted. No erythema.  Psychiatric: She has a normal mood and affect. Her behavior is normal.    BP 140/66 (BP Location: Left Arm, Patient Position: Sitting, Cuff Size: Normal)   Pulse 64   Temp 98.3 F (36.8 C) (Oral)   Resp 16   Wt 93 lb 6.4 oz (42.4 kg)   SpO2 98%   BMI 20.21 kg/m  Wt Readings from Last 3 Encounters:  06/30/17 93 lb 6.4 oz (42.4 kg)  06/26/17 91 lb (41.3 kg)  06/17/17 91 lb (41.3 kg)     Lab Results  Component Value Date   WBC 5.6 06/26/2017   HGB 12.2 06/26/2017   HCT 37.4 06/26/2017   PLT 236 06/26/2017   GLUCOSE 168 (H) 06/26/2017   CHOL 209 (H) 06/21/2014   TRIG 105.0 06/21/2014   HDL 77.00 06/21/2014   LDLDIRECT 107.1 07/29/2013   LDLCALC 111 (H) 06/21/2014   ALT 18 06/26/2017   AST 30 06/26/2017   NA 141 06/26/2017   K 3.7 06/26/2017   CL 106 06/26/2017   CREATININE 0.85 06/26/2017   BUN 27 (H) 06/26/2017   CO2 25 06/26/2017   TSH 2.38 03/10/2017   INR 1.0 01/25/2013   HGBA1C 6.4 03/10/2017   MICROALBUR 115.0 Repeated and verified X2. (H) 07/29/2013    Ct Abdomen Pelvis W Contrast  Result Date: 06/26/2017 CLINICAL DATA:  Abdominal pain EXAM: CT ABDOMEN AND PELVIS WITH CONTRAST TECHNIQUE: Multidetector CT imaging of the abdomen and pelvis was performed using the standard protocol following bolus administration of intravenous contrast. CONTRAST:  66m ISOVUE-300 IOPAMIDOL (ISOVUE-300) INJECTION 61% COMPARISON:  10/19/2013, 06/17/2011 FINDINGS: Lower chest: Lung bases demonstrate no  consolidation or effusion. Cardiomegaly. Hepatobiliary: Subcentimeter hypodense liver lesions, too small to further characterizebut several of which were present on comparison study from 2015. Dilated gallbladder containing small stones. No biliary dilatation Pancreas: Unremarkable. No inflammatory changes. Stable ductal dilatation at the head of pancreas. Spleen: Normal in size without focal abnormality. Adrenals/Urinary Tract: Adrenal gland are within normal limits. Kidneys show no hydronephrosis. Multiple subcentimeter cortical hypodense lesions too small to further characterize. Bladder normal. Stomach/Bowel: The stomach is slightly fluid-filled. Questionable mild thickening and edema at the pylorus, series 2, image number 32. Multiple fluid-filled but not significantly enlarged loops of small bowel in the left abdomen. This appears contiguous with a right groin hernia. The bowel  distal to this is nonenlarged. No significant colon wall thickening. Vascular/Lymphatic: Extensive atherosclerotic calcification of the aorta. No aneurysmal dilatation. No significantly enlarged lymph nodes Reproductive: Uterus and bilateral adnexa are unremarkable. Other: Negative for free air.  Trace free fluid in the pelvis. Musculoskeletal: Degenerative changes of the spine. No acute or suspicious lesion. Old right inferior pubic ramus fracture. Avascular necrosis of the left femoral head. IMPRESSION: 1. Few fluid filled but not significantly enlarged loops of distal small bowel in the left hemiabdomen, appears contiguous with a right groin hernia, findings could be secondary to a low-grade obstruction. 2. Dilated gallbladder containing multiple calcified stones 3. Possible edema or wall thickening at the pylorus, consider gastritis or peptic ulcer disease. No free air. Electronically Signed   By: Donavan Foil M.D.   On: 06/26/2017 23:16       Assessment & Plan:   Problem List Items Addressed This Visit    Abnormal CT of the  abdomen    Found to have thickening as outlined - consider PUD or gastritis.  Start zantac.  Follow.        Diabetes mellitus with peripheral vascular disease ([place])    Follow met b and a1c. Discussed eating regular meals.        Hypertension    Blood pressure slightly elevated.  Same medication regimen.  Follow.        Hypothyroidism    TSH 03/10/17 - wnl.  Follow.       Nasal obstruction    Breathing better.        Right groin hernia    Found on CT recently.  Reduced in ER.  Decreased pain now.  Bowels are moving.  Has f/u planned with surgery.            Einar Pheasant, MD

## 2017-07-03 ENCOUNTER — Encounter: Payer: Self-pay | Admitting: Internal Medicine

## 2017-07-03 DIAGNOSIS — R935 Abnormal findings on diagnostic imaging of other abdominal regions, including retroperitoneum: Secondary | ICD-10-CM | POA: Insufficient documentation

## 2017-07-03 DIAGNOSIS — K4090 Unilateral inguinal hernia, without obstruction or gangrene, not specified as recurrent: Secondary | ICD-10-CM | POA: Insufficient documentation

## 2017-07-03 NOTE — Assessment & Plan Note (Signed)
Found on CT recently.  Reduced in ER.  Decreased pain now.  Bowels are moving.  Has f/u planned with surgery.

## 2017-07-03 NOTE — Assessment & Plan Note (Signed)
Follow met b and a1c. Discussed eating regular meals.   

## 2017-07-03 NOTE — Assessment & Plan Note (Signed)
Breathing better.   

## 2017-07-03 NOTE — Assessment & Plan Note (Signed)
Found to have thickening as outlined - consider PUD or gastritis.  Start zantac.  Follow.

## 2017-07-03 NOTE — Assessment & Plan Note (Signed)
TSH 03/10/17 - wnl.  Follow.

## 2017-07-03 NOTE — Assessment & Plan Note (Signed)
Blood pressure slightly elevated.  Same medication regimen.  Follow.

## 2017-07-29 ENCOUNTER — Other Ambulatory Visit: Payer: Self-pay | Admitting: Internal Medicine

## 2017-08-06 ENCOUNTER — Telehealth: Payer: Self-pay

## 2017-08-06 NOTE — Telephone Encounter (Signed)
Spoken to care giver, patient has bilat leg edema.  This is a chronic issue per chart.  Patient was instructed to go to UC/ED.  Patient refused to go. I was able to book appointment with PCP @0930  [id number].  Patient did say that if she had anysx like SOB, Chest tightness, limb px, swelling getting worse, jaw pain, and neck pain she will go to UC/ED.

## 2017-08-06 NOTE — Telephone Encounter (Signed)
Copied from [place] [id number]. Topic: Appointment Scheduling - Scheduling Inquiry for Clinic >> Aug 06, 2017  2:42 PM Conception Chancy, NT wrote: Reason for CRM: patient caregiver [name] ) is calling because she states pt is needing an appt because her legs are still swollen like they were a month ago. She wants to see Dr. Nicki Reaper her pcp. She is full until middle of March And [name] would like a call back when pt can get fit in at [phone]

## 2017-08-07 ENCOUNTER — Other Ambulatory Visit: Payer: Self-pay | Admitting: Internal Medicine

## 2017-08-10 ENCOUNTER — Ambulatory Visit
Admission: RE | Admit: 2017-08-10 | Discharge: 2017-08-10 | Disposition: A | Discharge status: Home / Self Care | Payer: Medicare Other | Source: Ambulatory Visit | Attending: Internal Medicine | Admitting: Internal Medicine

## 2017-08-10 ENCOUNTER — Encounter: Payer: Self-pay | Admitting: Internal Medicine

## 2017-08-10 ENCOUNTER — Other Ambulatory Visit: Payer: Self-pay | Admitting: Internal Medicine

## 2017-08-10 ENCOUNTER — Ambulatory Visit (INDEPENDENT_AMBULATORY_CARE_PROVIDER_SITE_OTHER): Payer: Medicare Other | Admitting: Internal Medicine

## 2017-08-10 VITALS — BP 174/58 | HR 76 | Temp 98.6°F | Ht <= 58 in | Wt 99.0 lb

## 2017-08-10 DIAGNOSIS — I10 Essential (primary) hypertension: Secondary | ICD-10-CM

## 2017-08-10 DIAGNOSIS — R600 Localized edema: Secondary | ICD-10-CM

## 2017-08-10 DIAGNOSIS — M79605 Pain in left leg: Secondary | ICD-10-CM

## 2017-08-10 DIAGNOSIS — D649 Anemia, unspecified: Secondary | ICD-10-CM | POA: Diagnosis not present

## 2017-08-10 DIAGNOSIS — M7989 Other specified soft tissue disorders: Secondary | ICD-10-CM | POA: Insufficient documentation

## 2017-08-10 DIAGNOSIS — J3489 Other specified disorders of nose and nasal sinuses: Secondary | ICD-10-CM | POA: Diagnosis not present

## 2017-08-10 DIAGNOSIS — I739 Peripheral vascular disease, unspecified: Secondary | ICD-10-CM

## 2017-08-10 DIAGNOSIS — E1151 Type 2 diabetes mellitus with diabetic peripheral angiopathy without gangrene: Secondary | ICD-10-CM

## 2017-08-10 DIAGNOSIS — K4090 Unilateral inguinal hernia, without obstruction or gangrene, not specified as recurrent: Secondary | ICD-10-CM | POA: Diagnosis not present

## 2017-08-10 DIAGNOSIS — M79604 Pain in right leg: Secondary | ICD-10-CM | POA: Insufficient documentation

## 2017-08-10 DIAGNOSIS — E039 Hypothyroidism, unspecified: Secondary | ICD-10-CM | POA: Diagnosis not present

## 2017-08-10 DIAGNOSIS — E7800 Pure hypercholesterolemia, unspecified: Secondary | ICD-10-CM

## 2017-08-10 MED ORDER — AMLODIPINE BESYLATE 5 MG PO TABS
5.0000 mg | ORAL_TABLET | Freq: Every day | ORAL | 0 refills | Status: DC
Start: 2017-08-10 — End: 2017-11-02

## 2017-08-10 MED ORDER — FUROSEMIDE 20 MG PO TABS: 20.0000 mg | ORAL_TABLET | Freq: Every day | ORAL | 0 refills | Status: DC

## 2017-08-10 NOTE — Progress Notes (Signed)
Pre visit review using our clinic review tool, if applicable. No additional management support is needed unless otherwise documented below in the visit note. 

## 2017-08-10 NOTE — Patient Instructions (Signed)
Decrease amlodipine to 5mg  - one per day  Start lasix 20mg  per day

## 2017-08-10 NOTE — Progress Notes (Signed)
Patient ID: Tara [name], female   DOB: [birth year], 83 y.o.   MRN: [id number]   Subjective:    Patient ID: Tara [name], female    DOB: [birth year], 83 y.o.   MRN: [id number]  HPI  Patient here with concerns regarding lower extremity swelling.  She is accompanied by her daughter [name].  History obtained from both of them.  Reports that she has had worsening lower extremity swelling since her last visit.  Not wearing compression hose.  Does report some nasal congestion at times, but reports overall is better.  Using nasal spray.  No increased sob.  Overall states her breathing is relatively stable.  No chest pain.  Eating well.  Has a good appetite.  Reports sugars doing well.  No low sugars.  No abdominal pain.  Bowels moving.  Increased discomfort in her legs - which I feel is being aggravated by the swelling.  Does not prop up her legs. Feels tight when she tries to bend her leg.  Discussed salt intake.  She adds salt to her food.  Blood pressure elevated today.     Past Medical History:  Diagnosis Date  . Asthma   . Diabetes mellitus ([place])   . Environmental allergies   . Hypertension    Past Surgical History:  Procedure Laterality Date  . APPENDECTOMY    . TUBAL LIGATION     Family History  Problem Relation Age of Onset  . Hypertension Mother   . Cancer Brother        lung (question)  . Cancer Sister        questionable type   Social History   Socioeconomic History  . Marital status: Widowed    Spouse name: None  . Number of children: None  . Years of education: None  . Highest education level: None  Social Needs  . Financial resource strain: None  . Food insecurity - worry: None  . Food insecurity - inability: None  . Transportation needs - medical: None  . Transportation needs - non-medical: None  Occupational History  . None  Tobacco Use  . Smoking status: Never Smoker  . Smokeless tobacco: Never Used  Substance and Sexual Activity  . Alcohol use: No   Alcohol/week: 0.0 oz  . Drug use: No  . Sexual activity: No  Other Topics Concern  . None  Social History Narrative  . None    Outpatient Encounter Medications as of 08/10/2017  Medication Sig  . acetaminophen (TYLENOL) 325 MG tablet Take 650 mg by mouth every 6 (six) hours as needed.  Marland Kitchen amLODipine (NORVASC) 5 MG tablet Take 1 tablet (5 mg total) by mouth daily.  Marland Kitchen aspirin 81 MG tablet Take 81 mg by mouth daily.  . Calcium Carbonate-Vitamin D (CALTRATE 600+D PO) Take by mouth.  . Cholecalciferol (D3 SUPER STRENGTH) 2000 UNITS CAPS Take by mouth.  . fexofenadine (ALLEGRA) 180 MG tablet Take 1 tablet (180 mg total) daily by mouth.  . fluticasone (FLONASE) 50 MCG/ACT nasal spray Place 2 sprays daily into both nostrils.  Marland Kitchen glucose blood (ACCU-CHEK AVIVA PLUS) test strip CHECK SUGAR ONCE DAILY  . ipratropium (ATROVENT HFA) 17 MCG/ACT inhaler Inhale 1 puff every 6 (six) hours as needed into the lungs for wheezing.  . metoprolol succinate (TOPROL-XL) 25 MG 24 hr tablet TAKE ONE (1) TABLET EACH DAY  . Multiple Vitamin (MULTIVITAMIN) tablet Take 1 tablet by mouth daily. Reported on 12/24/2015  . SYNTHROID 50 MCG tablet TAKE ONE (1)  TABLET EACH DAY  . [DISCONTINUED] amLODipine (NORVASC) 5 MG tablet Take 1 tablet (5 mg total) by mouth 2 (two) times daily.  . [DISCONTINUED] hydrALAZINE (APRESOLINE) 50 MG tablet TAKE ONE (1) TABLET THREE (3) TIMES EACH DAY  . furosemide (LASIX) 20 MG tablet Take 1 tablet (20 mg total) by mouth daily.   No facility-administered encounter medications on file as of 08/10/2017.     Review of Systems  Constitutional: Negative for appetite change and unexpected weight change.  HENT: Positive for congestion. Negative for sinus pressure.   Respiratory: Negative for cough and chest tightness.        Breathing is overall stable.    Cardiovascular: Positive for leg swelling. Negative for chest pain and palpitations.  Gastrointestinal: Negative for abdominal pain,  diarrhea, nausea and vomiting.  Genitourinary: Negative for difficulty urinating and dysuria.  Musculoskeletal:       Leg pain as outlined.    Skin: Negative for color change and rash.  Neurological: Negative for dizziness, light-headedness and headaches.  Psychiatric/Behavioral: Negative for agitation and dysphoric mood.       Objective:     Blood pressure rechecked by me:  164/78  Physical Exam  Constitutional: She appears well-developed and well-nourished. No distress.  HENT:  Mouth/Throat: Oropharynx is clear and moist.  Nares - slightly erythematous turbinates.    Neck: Neck supple. No thyromegaly present.  Cardiovascular: Normal rate and regular rhythm.  0-6/2 systolic murmur.   Pulmonary/Chest: Breath sounds normal. No respiratory distress. She has no wheezes.  Abdominal: Soft. Bowel sounds are normal. There is no tenderness.  Musculoskeletal: She exhibits edema. She exhibits no tenderness.  Increased lower extremity edema - pedal and lower legs.  Increased "tight feeling" with flexion at the knees.  No increased erythema.    Lymphadenopathy:    She has no cervical adenopathy.  Skin: No rash noted. No erythema.  Psychiatric: She has a normal mood and affect. Her behavior is normal.    BP (!) 174/58   Pulse 76   Temp 98.6 F (37 C) (Oral)   Ht '4\' 9"'$  (1.448 m)   Wt 99 lb (44.9 kg)   SpO2 97%   BMI 21.42 kg/m  Wt Readings from Last 3 Encounters:  08/10/17 99 lb (44.9 kg)  06/30/17 93 lb 6.4 oz (42.4 kg)  06/26/17 91 lb (41.3 kg)     Lab Results  Component Value Date   WBC 5.6 06/26/2017   HGB 12.2 06/26/2017   HCT 37.4 06/26/2017   PLT 236 06/26/2017   GLUCOSE 123 (H) 08/10/2017   CHOL 209 (H) 06/21/2014   TRIG 105.0 06/21/2014   HDL 77.00 06/21/2014   LDLDIRECT 107.1 07/29/2013   LDLCALC 111 (H) 06/21/2014   ALT 17 08/10/2017   AST 24 08/10/2017   NA 141 08/10/2017   K 3.9 08/10/2017   CL 107 08/10/2017   CREATININE 0.71 08/10/2017   BUN 24  08/10/2017   CO2 23 08/10/2017   TSH 4.72 (H) 08/10/2017   INR 1.0 01/25/2013   HGBA1C 6.4 03/10/2017   MICROALBUR 115.0 Repeated and verified X2. (H) 07/29/2013    Ct Abdomen Pelvis W Contrast  Result Date: 06/26/2017 CLINICAL DATA:  Abdominal pain EXAM: CT ABDOMEN AND PELVIS WITH CONTRAST TECHNIQUE: Multidetector CT imaging of the abdomen and pelvis was performed using the standard protocol following bolus administration of intravenous contrast. CONTRAST:  14m ISOVUE-300 IOPAMIDOL (ISOVUE-300) INJECTION 61% COMPARISON:  10/19/2013, 06/17/2011 FINDINGS: Lower chest: Lung bases demonstrate no consolidation  or effusion. Cardiomegaly. Hepatobiliary: Subcentimeter hypodense liver lesions, too small to further characterizebut several of which were present on comparison study from 2015. Dilated gallbladder containing small stones. No biliary dilatation Pancreas: Unremarkable. No inflammatory changes. Stable ductal dilatation at the head of pancreas. Spleen: Normal in size without focal abnormality. Adrenals/Urinary Tract: Adrenal gland are within normal limits. Kidneys show no hydronephrosis. Multiple subcentimeter cortical hypodense lesions too small to further characterize. Bladder normal. Stomach/Bowel: The stomach is slightly fluid-filled. Questionable mild thickening and edema at the pylorus, series 2, image number 32. Multiple fluid-filled but not significantly enlarged loops of small bowel in the left abdomen. This appears contiguous with a right groin hernia. The bowel distal to this is nonenlarged. No significant colon wall thickening. Vascular/Lymphatic: Extensive atherosclerotic calcification of the aorta. No aneurysmal dilatation. No significantly enlarged lymph nodes Reproductive: Uterus and bilateral adnexa are unremarkable. Other: Negative for free air.  Trace free fluid in the pelvis. Musculoskeletal: Degenerative changes of the spine. No acute or suspicious lesion. Old right inferior pubic  ramus fracture. Avascular necrosis of the left femoral head. IMPRESSION: 1. Few fluid filled but not significantly enlarged loops of distal small bowel in the left hemiabdomen, appears contiguous with a right groin hernia, findings could be secondary to a low-grade obstruction. 2. Dilated gallbladder containing multiple calcified stones 3. Possible edema or wall thickening at the pylorus, consider gastritis or peptic ulcer disease. No free air. Electronically Signed   By: Donavan Foil M.D.   On: 06/26/2017 23:16       Assessment & Plan:   Problem List Items Addressed This Visit    Anemia   Diabetes mellitus with peripheral vascular disease ([place])    No low sugars.  States sugars doing well.  Follow met b and a1c.  Has seen vascular surgery for her legs.  May need referral back.  Treat swelling as outlined.        Relevant Medications   amLODipine (NORVASC) 5 MG tablet   furosemide (LASIX) 20 MG tablet   Hypercholesteremia    Follow lipid panel.        Relevant Medications   amLODipine (NORVASC) 5 MG tablet   furosemide (LASIX) 20 MG tablet   Hypertension - Primary    Blood pressure elevated today.  Recheck slightly improved.  Amlodipine may be aggravating the swelling.  Decrease to 79m q day.  Start lasix 271mq day.  Follow pressures.  Check metabolic panel.  Get her back in soon to reassess.        Relevant Medications   amLODipine (NORVASC) 5 MG tablet   furosemide (LASIX) 20 MG tablet   Other Relevant Orders   Basic Metabolic Panel (BMET) (Completed)   Hepatic function panel (Completed)   Hypothyroidism    On thyroid replacement.  Follow tsh.        Relevant Orders   TSH (Completed)   Lower extremity edema    Increased swelling and discomfort as outlined.  Will check lower extremity ultrasound to confirm no DVT.  Decrease amlodipine to 84m62m day.  Compression hose.  Leg elevation.  Decreased salt intake.  Start lasix 19m39mday.  Check metabolic panel, thyroid test and  liver function tests.  Get her back in soon to reassess.        Relevant Orders   US VK[place]ous Img Lower Bilateral (Completed)   Nasal obstruction    Improved.  Continue nasal sprays.        Peripheral vascular disease (  [facility])   Relevant Medications   amLODipine (NORVASC) 5 MG tablet   furosemide (LASIX) 20 MG tablet   Right groin hernia    Found on CT recently.  Reduced in ER.  See last note.  No pain or problems since.  Follow.         Other Visit Diagnoses    Pain in both lower extremities       Relevant Orders   US Venous Img Lower Bilateral (Completed)       Einar Pheasant, MD

## 2017-08-11 ENCOUNTER — Encounter: Payer: Self-pay | Admitting: Internal Medicine

## 2017-08-11 LAB — HEPATIC FUNCTION PANEL
AG RATIO: 1.1 (calc) (ref 1.0–2.5)
ALKALINE PHOSPHATASE (APISO): 45 U/L (ref 33–130)
ALT: 17 U/L (ref 6–29)
AST: 24 U/L (ref 10–35)
Albumin: 3.6 g/dL (ref 3.6–5.1)
Bilirubin, Direct: 0.1 mg/dL (ref 0.0–0.2)
Globulin: 3.3 g/dL (calc) (ref 1.9–3.7)
Indirect Bilirubin: 0.2 mg/dL (calc) (ref 0.2–1.2)
TOTAL PROTEIN: 6.9 g/dL (ref 6.1–8.1)
Total Bilirubin: 0.3 mg/dL (ref 0.2–1.2)

## 2017-08-11 LAB — BASIC METABOLIC PANEL
BUN: 24 mg/dL (ref 7–25)
CHLORIDE: 107 mmol/L (ref 98–110)
CO2: 23 mmol/L (ref 20–32)
CREATININE: 0.71 mg/dL (ref 0.60–0.88)
Calcium: 9 mg/dL (ref 8.6–10.4)
Glucose, Bld: 123 mg/dL — ABNORMAL HIGH (ref 65–99)
Potassium: 3.9 mmol/L (ref 3.5–5.3)
Sodium: 141 mmol/L (ref 135–146)

## 2017-08-11 LAB — TSH: TSH: 4.72 m[IU]/L — AB (ref 0.40–4.50)

## 2017-08-11 NOTE — Assessment & Plan Note (Signed)
Improved.  Continue nasal sprays.

## 2017-08-11 NOTE — Assessment & Plan Note (Signed)
Follow lipid panel.   

## 2017-08-11 NOTE — Assessment & Plan Note (Signed)
On thyroid replacement.  Follow tsh.  

## 2017-08-11 NOTE — Assessment & Plan Note (Signed)
Blood pressure elevated today.  Recheck slightly improved.  Amlodipine may be aggravating the swelling.  Decrease to 5mg  q day.  Start lasix 20mg  q day.  Follow pressures.  Check metabolic panel.  Get her back in soon to reassess.

## 2017-08-11 NOTE — Assessment & Plan Note (Signed)
No low sugars.  States sugars doing well.  Follow met b and a1c.  Has seen vascular surgery for her legs.  May need referral back.  Treat swelling as outlined.   

## 2017-08-11 NOTE — Assessment & Plan Note (Signed)
Found on CT recently.  Reduced in ER.  See last note.  No pain or problems since.  Follow.

## 2017-08-11 NOTE — Assessment & Plan Note (Signed)
Increased swelling and discomfort as outlined.  Will check lower extremity ultrasound to confirm no DVT.  Decrease amlodipine to 5mg  q day.  Compression hose.  Leg elevation.  Decreased salt intake.  Start lasix 20mg  q day.  Check metabolic panel, thyroid test and liver function tests.  Get her back in soon to reassess.

## 2017-08-25 ENCOUNTER — Ambulatory Visit (INDEPENDENT_AMBULATORY_CARE_PROVIDER_SITE_OTHER): Payer: Medicare Other | Admitting: Internal Medicine

## 2017-08-25 ENCOUNTER — Encounter: Payer: Self-pay | Admitting: Internal Medicine

## 2017-08-25 ENCOUNTER — Ambulatory Visit: Payer: Medicare Other

## 2017-08-25 ENCOUNTER — Ambulatory Visit (INDEPENDENT_AMBULATORY_CARE_PROVIDER_SITE_OTHER): Payer: Medicare Other

## 2017-08-25 VITALS — BP 158/70 | Temp 98.6°F | Resp 18 | Wt 97.2 lb

## 2017-08-25 DIAGNOSIS — R600 Localized edema: Secondary | ICD-10-CM

## 2017-08-25 DIAGNOSIS — I739 Peripheral vascular disease, unspecified: Secondary | ICD-10-CM | POA: Diagnosis not present

## 2017-08-25 DIAGNOSIS — M79604 Pain in right leg: Secondary | ICD-10-CM | POA: Diagnosis not present

## 2017-08-25 DIAGNOSIS — M79605 Pain in left leg: Secondary | ICD-10-CM | POA: Diagnosis not present

## 2017-08-25 DIAGNOSIS — E1151 Type 2 diabetes mellitus with diabetic peripheral angiopathy without gangrene: Secondary | ICD-10-CM | POA: Diagnosis not present

## 2017-08-25 DIAGNOSIS — J3489 Other specified disorders of nose and nasal sinuses: Secondary | ICD-10-CM

## 2017-08-25 DIAGNOSIS — M79641 Pain in right hand: Secondary | ICD-10-CM | POA: Diagnosis not present

## 2017-08-25 DIAGNOSIS — E039 Hypothyroidism, unspecified: Secondary | ICD-10-CM | POA: Diagnosis not present

## 2017-08-25 DIAGNOSIS — I10 Essential (primary) hypertension: Secondary | ICD-10-CM

## 2017-08-25 DIAGNOSIS — E7800 Pure hypercholesterolemia, unspecified: Secondary | ICD-10-CM

## 2017-08-25 DIAGNOSIS — M79606 Pain in leg, unspecified: Secondary | ICD-10-CM | POA: Insufficient documentation

## 2017-08-25 DIAGNOSIS — S6291XA Unspecified fracture of right wrist and hand, initial encounter for closed fracture: Secondary | ICD-10-CM | POA: Diagnosis not present

## 2017-08-25 DIAGNOSIS — M79644 Pain in right finger(s): Secondary | ICD-10-CM | POA: Diagnosis not present

## 2017-08-25 LAB — BASIC METABOLIC PANEL
BUN: 23 mg/dL (ref 6–23)
CALCIUM: 8.8 mg/dL (ref 8.4–10.5)
CO2: 29 mEq/L (ref 19–32)
Chloride: 103 mEq/L (ref 96–112)
Creatinine, Ser: 0.7 mg/dL (ref 0.40–1.20)
GFR: 99.46 mL/min (ref >60.00)
GLUCOSE: 126 mg/dL — AB (ref 70–99)
POTASSIUM: 3.4 meq/L — AB (ref 3.5–5.1)
SODIUM: 139 meq/L (ref 135–145)

## 2017-08-25 MED ORDER — METOPROLOL SUCCINATE ER 25 MG PO TB24: 25.0000 mg | ORAL_TABLET | Freq: Two times a day (BID) | ORAL | 3 refills | Status: DC

## 2017-08-25 NOTE — Patient Instructions (Signed)
Increase metoprolol to twice a day

## 2017-08-25 NOTE — Progress Notes (Signed)
Patient ID: Tara [name], female   DOB: 17-May-1918, 84 y.o.   MRN: [id number]   Subjective:    Patient ID: Tara [name], female    DOB: Oct 28, 1917, 84 y.o.   MRN: [id number]  HPI  Patient here for a scheduled follow up.  Last visit, she was having issues with increased lower extremity swelling and elevated blood pressure.  Her amlodipine was decreased to see if this helped with the swelling.  Lasix added daily.  Her feet and lower leg swelling has improved.  She is wearing compression hose.  Now reports increased swelling above the top of her hose.  She tries to stay as active as possible.  She did fall recently.  Her hand hit the dresser.  She did not hit her head.  No other injury.  (caught herself).  Has had persistent swelling and pain - right hand.  No chest pain.  No nausea or vomiting.  No abdominal pain. Some increased leg pain.     Past Medical History:  Diagnosis Date  . Asthma   . Diabetes mellitus ([place])   . Environmental allergies   . Hypertension    Past Surgical History:  Procedure Laterality Date  . APPENDECTOMY    . TUBAL LIGATION     Family History  Problem Relation Age of Onset  . Hypertension Mother   . Cancer Brother        lung (question)  . Cancer Sister        questionable type   Social History   Socioeconomic History  . Marital status: Widowed    Spouse name: None  . Number of children: None  . Years of education: None  . Highest education level: None  Social Needs  . Financial resource strain: None  . Food insecurity - worry: None  . Food insecurity - inability: None  . Transportation needs - medical: None  . Transportation needs - non-medical: None  Occupational History  . None  Tobacco Use  . Smoking status: Never Smoker  . Smokeless tobacco: Never Used  Substance and Sexual Activity  . Alcohol use: No    Alcohol/week: 0.0 oz  . Drug use: No  . Sexual activity: No  Other Topics Concern  . None  Social History Narrative  . None     Outpatient Encounter Medications as of 08/25/2017  Medication Sig  . acetaminophen (TYLENOL) 325 MG tablet Take 650 mg by mouth every 6 (six) hours as needed.  Marland Kitchen amLODipine (NORVASC) 5 MG tablet Take 1 tablet (5 mg total) by mouth daily.  Marland Kitchen aspirin 81 MG tablet Take 81 mg by mouth daily.  . Calcium Carbonate-Vitamin D (CALTRATE 600+D PO) Take by mouth.  . Cholecalciferol (D3 SUPER STRENGTH) 2000 UNITS CAPS Take by mouth.  . fexofenadine (ALLEGRA) 180 MG tablet Take 1 tablet (180 mg total) daily by mouth.  . fluticasone (FLONASE) 50 MCG/ACT nasal spray Place 2 sprays daily into both nostrils.  Marland Kitchen glucose blood (ACCU-CHEK AVIVA PLUS) test strip CHECK SUGAR ONCE DAILY  . hydrALAZINE (APRESOLINE) 50 MG tablet TAKE ONE (1) TABLET THREE (3) TIMES EACH DAY  . ipratropium (ATROVENT HFA) 17 MCG/ACT inhaler Inhale 1 puff every 6 (six) hours as needed into the lungs for wheezing.  . metoprolol succinate (TOPROL-XL) 25 MG 24 hr tablet Take 1 tablet (25 mg total) by mouth 2 (two) times daily.  . Multiple Vitamin (MULTIVITAMIN) tablet Take 1 tablet by mouth daily. Reported on 12/24/2015  . SYNTHROID 50 MCG  tablet TAKE ONE (1) TABLET EACH DAY  . [DISCONTINUED] furosemide (LASIX) 20 MG tablet Take 1 tablet (20 mg total) by mouth daily.  . [DISCONTINUED] metoprolol succinate (TOPROL-XL) 25 MG 24 hr tablet TAKE ONE (1) TABLET EACH DAY   No facility-administered encounter medications on file as of 08/25/2017.     Review of Systems  Constitutional: Negative for appetite change and unexpected weight change.  HENT: Negative for sinus pressure.        Minimal nasal congestion.    Respiratory: Negative for cough, chest tightness and shortness of breath.   Cardiovascular: Positive for leg swelling. Negative for chest pain and palpitations.  Gastrointestinal: Negative for abdominal pain, diarrhea, nausea and vomiting.  Genitourinary: Negative for difficulty urinating and dysuria.  Musculoskeletal:       Right  hand pain and swelling as outlined.  Leg pain as outlined.   Skin: Negative for color change and rash.  Neurological: Negative for dizziness, light-headedness and headaches.  Psychiatric/Behavioral: Negative for agitation and dysphoric mood.       Objective:    Physical Exam  Constitutional: She appears well-developed and well-nourished. No distress.  HENT:  Mouth/Throat: Oropharynx is clear and moist.  Nares - erythematous turbinates.   Neck: Neck supple. No thyromegaly present.  Cardiovascular: Normal rate and regular rhythm.  Pulmonary/Chest: Breath sounds normal. No respiratory distress. She has no wheezes.  Abdominal: Soft. Bowel sounds are normal. There is no tenderness.  Musculoskeletal:  Increased soft tissue swelling - right hand.  Increased pain - base of right fourth finger.  Decreased swelling- feet and lower legs.  Still with swelling - above hose.    Lymphadenopathy:    She has no cervical adenopathy.  Skin: No rash noted. No erythema.  Psychiatric: She has a normal mood and affect. Her behavior is normal.    BP (!) 158/70   Temp 98.6 F (37 C) (Oral)   Resp 18   Wt 97 lb 3.2 oz (44.1 kg)   SpO2 96%   BMI 21.03 kg/m  Wt Readings from Last 3 Encounters:  08/25/17 97 lb 3.2 oz (44.1 kg)  08/10/17 99 lb (44.9 kg)  06/30/17 93 lb 6.4 oz (42.4 kg)     Lab Results  Component Value Date   WBC 5.6 06/26/2017   HGB 12.2 06/26/2017   HCT 37.4 06/26/2017   PLT 236 06/26/2017   GLUCOSE 126 (H) 08/25/2017   CHOL 209 (H) 06/21/2014   TRIG 105.0 06/21/2014   HDL 77.00 06/21/2014   LDLDIRECT 107.1 07/29/2013   LDLCALC 111 (H) 06/21/2014   ALT 17 08/10/2017   AST 24 08/10/2017   NA 139 08/25/2017   K 3.4 (L) 08/25/2017   CL 103 08/25/2017   CREATININE 0.70 08/25/2017   BUN 23 08/25/2017   CO2 29 08/25/2017   TSH 4.72 (H) 08/10/2017   INR 1.0 01/25/2013   HGBA1C 6.4 03/10/2017   MICROALBUR 115.0 Repeated and verified X2. (H) 07/29/2013    US Venous Img  Lower Bilateral  Result Date: 08/10/2017 CLINICAL DATA:  84 year old female with a history of bilateral lower extremity swelling. EXAM: BILATERAL LOWER EXTREMITY VENOUS DOPPLER ULTRASOUND TECHNIQUE: Gray-scale sonography with graded compression, as well as color Doppler and duplex ultrasound were performed to evaluate the lower extremity deep venous systems from the level of the common femoral vein and including the common femoral, femoral, profunda femoral, popliteal and calf veins including the posterior tibial, peroneal and gastrocnemius veins when visible. The superficial great saphenous vein  was also interrogated. Spectral Doppler was utilized to evaluate flow at rest and with distal augmentation maneuvers in the common femoral, femoral and popliteal veins. COMPARISON:  None. FINDINGS: RIGHT LOWER EXTREMITY Common Femoral Vein: No evidence of thrombus. Normal compressibility, respiratory phasicity and response to augmentation. Saphenofemoral Junction: No evidence of thrombus. Normal compressibility and flow on color Doppler imaging. Profunda Femoral Vein: No evidence of thrombus. Normal compressibility and flow on color Doppler imaging. Femoral Vein: No evidence of thrombus. Normal compressibility, respiratory phasicity and response to augmentation. Popliteal Vein: No evidence of thrombus. Normal compressibility, respiratory phasicity and response to augmentation. Calf Veins: No evidence of thrombus. Normal compressibility and flow on color Doppler imaging. Superficial Great Saphenous Vein: No evidence of thrombus. Normal compressibility and flow on color Doppler imaging. Other Findings:  Edema LEFT LOWER EXTREMITY Common Femoral Vein: No evidence of thrombus. Normal compressibility, respiratory phasicity and response to augmentation. Saphenofemoral Junction: No evidence of thrombus. Normal compressibility and flow on color Doppler imaging. Profunda Femoral Vein: No evidence of thrombus. Normal  compressibility and flow on color Doppler imaging. Femoral Vein: No evidence of thrombus. Normal compressibility, respiratory phasicity and response to augmentation. Popliteal Vein: No evidence of thrombus. Normal compressibility, respiratory phasicity and response to augmentation. Calf Veins: No evidence of thrombus. Normal compressibility and flow on color Doppler imaging. Superficial Great Saphenous Vein: No evidence of thrombus. Normal compressibility and flow on color Doppler imaging. Other Findings:  Edema IMPRESSION: Sonographic survey of the bilateral lower extremities negative for DVT. Soft tissue edema of the lower legs. Electronically Signed   By: Corrie Mckusick D.O.   On: 08/10/2017 12:10       Assessment & Plan:   Problem List Items Addressed This Visit    Diabetes mellitus with peripheral vascular disease ([place])    No low sugars.  No recorded sugars, but she states her sugars are doing well.  Last a1c 6.4.  With increased pain in her legs and swelling, will refer back to vascular surgery.        Relevant Medications   metoprolol succinate (TOPROL-XL) 25 MG 24 hr tablet   Hypercholesteremia    Follow lipid panel.       Relevant Medications   metoprolol succinate (TOPROL-XL) 25 MG 24 hr tablet   Hypertension    Blood pressure remains elevated.  She is on lasix now (daily).  Amlodipine was decreased to 5mg  given increased lower extremity swelling.  Had angioedema with ace inhibitor.  On metoprolol.  Will increase to bid dosing.  Follow pressures.  Recheck metabolic panel today since started lasix.        Relevant Medications   metoprolol succinate (TOPROL-XL) 25 MG 24 hr tablet   Hypothyroidism    On thyroid replacement.  Follow tsh.       Relevant Medications   metoprolol succinate (TOPROL-XL) 25 MG 24 hr tablet   Leg pain    Leg pain and swelling as outlined.  Refer back to vascular surgery.  Medication adjusted.        Relevant Orders   Ambulatory referral to Vascular  Surgery   Lower extremity edema   Relevant Orders   Basic metabolic panel (Completed)   Ambulatory referral to Vascular Surgery   Nasal obstruction    Improved.  Continue current regimen.       Peripheral vascular disease ([place])    Has been evaluated and followed by vascular surgery previously.  Now with increased pain and swelling of her legs.  Have  adjusted her medication as outlined.  She is wearing hose.  Discussed referral back to vascular surgery for evaluation and help with the increased swelling, etc.  She is in agreement.        Relevant Medications   metoprolol succinate (TOPROL-XL) 25 MG 24 hr tablet   Right hand pain    Persistent s/p injury.  Xray.  Discussed ortho referral.       Relevant Orders   DG Hand Complete Right (Completed)    Other Visit Diagnoses    Finger pain, right    -  Primary       Einar Pheasant, MD

## 2017-08-26 ENCOUNTER — Other Ambulatory Visit: Payer: Self-pay | Admitting: Internal Medicine

## 2017-08-26 DIAGNOSIS — S62644A Nondisplaced fracture of proximal phalanx of right ring finger, initial encounter for closed fracture: Secondary | ICD-10-CM

## 2017-08-26 MED ORDER — FUROSEMIDE 20 MG PO TABS: 20.0000 mg | ORAL_TABLET | Freq: Every day | ORAL | 1 refills | Status: DC

## 2017-08-26 MED ORDER — POTASSIUM CHLORIDE ER 10 MEQ PO TBCR: 10.0000 meq | EXTENDED_RELEASE_TABLET | Freq: Every day | ORAL | 1 refills | Status: DC

## 2017-08-26 NOTE — Progress Notes (Signed)
rx sent in for [id number] #[id number] with one refill.  Also refilled lasix

## 2017-08-26 NOTE — Progress Notes (Signed)
Order placed for ortho referral.   

## 2017-08-27 ENCOUNTER — Encounter: Payer: Self-pay | Admitting: Internal Medicine

## 2017-08-27 NOTE — Assessment & Plan Note (Signed)
Has been evaluated and followed by vascular surgery previously.  Now with increased pain and swelling of her legs.  Have adjusted her medication as outlined.  She is wearing hose.  Discussed referral back to vascular surgery for evaluation and help with the increased swelling, etc.  She is in agreement.

## 2017-08-27 NOTE — Assessment & Plan Note (Signed)
Leg pain and swelling as outlined.  Refer back to vascular surgery.  Medication adjusted.

## 2017-08-27 NOTE — Assessment & Plan Note (Signed)
Persistent s/p injury.  Xray.  Discussed ortho referral.

## 2017-08-27 NOTE — Assessment & Plan Note (Signed)
On thyroid replacement.  Follow tsh.  

## 2017-08-27 NOTE — Assessment & Plan Note (Signed)
Improved.  Continue current regimen

## 2017-08-27 NOTE — Assessment & Plan Note (Signed)
Follow lipid panel.   

## 2017-08-27 NOTE — Assessment & Plan Note (Signed)
No low sugars.  No recorded sugars, but she states her sugars are doing well.  Last a1c 6.4.  With increased pain in her legs and swelling, will refer back to vascular surgery.

## 2017-08-27 NOTE — Assessment & Plan Note (Signed)
Blood pressure remains elevated.  She is on lasix now (daily).  Amlodipine was decreased to 5mg  given increased lower extremity swelling.  Had angioedema with ace inhibitor.  On metoprolol.  Will increase to bid dosing.  Follow pressures.  Recheck metabolic panel today since started lasix.

## 2017-09-01 DIAGNOSIS — S62644A Nondisplaced fracture of proximal phalanx of right ring finger, initial encounter for closed fracture: Secondary | ICD-10-CM | POA: Diagnosis not present

## 2017-09-07 ENCOUNTER — Encounter (INDEPENDENT_AMBULATORY_CARE_PROVIDER_SITE_OTHER): Payer: Self-pay | Admitting: Vascular Surgery

## 2017-09-07 ENCOUNTER — Ambulatory Visit (INDEPENDENT_AMBULATORY_CARE_PROVIDER_SITE_OTHER): Payer: Medicare Other | Admitting: Vascular Surgery

## 2017-09-07 VITALS — BP 191/80 | HR 65 | Resp 21 | Ht <= 58 in | Wt 93.0 lb

## 2017-09-07 DIAGNOSIS — M79604 Pain in right leg: Secondary | ICD-10-CM | POA: Diagnosis not present

## 2017-09-07 DIAGNOSIS — E1151 Type 2 diabetes mellitus with diabetic peripheral angiopathy without gangrene: Secondary | ICD-10-CM

## 2017-09-07 DIAGNOSIS — R600 Localized edema: Secondary | ICD-10-CM

## 2017-09-07 DIAGNOSIS — M79605 Pain in left leg: Secondary | ICD-10-CM

## 2017-09-07 DIAGNOSIS — E7800 Pure hypercholesterolemia, unspecified: Secondary | ICD-10-CM

## 2017-09-07 NOTE — Progress Notes (Signed)
Subjective:    Patient ID: Tara [name], female    DOB: Oct 01, 1917, 84 y.o.   MRN: [id number] Chief Complaint  Patient presents with  . New Patient (Initial Visit)    Edema   Presents as a new patient referred by Dr. Nicki Reaper for evaluation of lower extremity edema and discomfort.  The patient was seen with family member.  The patient endorses a progressively worsening discomfort associated with bilateral lower extremity edema.  The patient notes that her pain is worse at night.  The patient notes that her edema is also worse towards the evening.  The patient notes a weakness to the bilateral lower extremity as well.  The patient does not engage in conservative therapy at this time.  The patient denies any rest pain or ulceration to the lower extremity.  The patient does note that the "throbbing" in her lower extremity at night does keep her awake.  The patient denies any trauma or surgery to the bilateral lower extremity.  The patient denies any DVT history to the bilateral lower extremity.  The patient denies any fever, nausea vomiting.   Review of Systems  Constitutional: Negative.   HENT: Negative.   Eyes: Negative.   Respiratory: Negative.   Cardiovascular: Positive for leg swelling.       Lower extremity pain  Gastrointestinal: Negative.   Endocrine: Negative.   Genitourinary: Negative.   Musculoskeletal: Negative.   Skin: Negative.   Allergic/Immunologic: Negative.   Neurological: Negative.   Hematological: Negative.   Psychiatric/Behavioral: Negative.       Objective:   Physical Exam  Constitutional: She is oriented to person, place, and time. She appears well-developed and well-nourished. No distress.  HENT:  Head: Normocephalic and atraumatic.  Eyes: Conjunctivae are normal. Pupils are equal, round, and reactive to light.  Neck: Normal range of motion.  Cardiovascular: Normal rate, regular rhythm, normal heart sounds and intact distal pulses.  Pulses:      Radial  pulses are 2+ on the right side, and 2+ on the left side.  Hard to palpate pedal pulses due to edema however her bilateral feet are warm  Pulmonary/Chest: Effort normal and breath sounds normal.  Musculoskeletal: Normal range of motion. She exhibits edema (Mild to moderate slightly 1+ pitting edema noted bilaterally).  Neurological: She is alert and oriented to person, place, and time.  Skin: She is not diaphoretic.  Bilateral lower extremity: There is no stasis dermatitis, there are no skin changes, there is no cellulitis, skin is intact.  Psychiatric: She has a normal mood and affect. Her behavior is normal. Judgment and thought content normal.  Vitals reviewed.  BP (!) 191/80 (BP Location: Right Arm, Patient Position: Sitting)   Pulse 65   Resp (!) 21   Ht 4\' 10"  (1.473 m)   Wt 93 lb (42.2 kg)   BMI 19.44 kg/m   Past Medical History:  Diagnosis Date  . Asthma   . Diabetes mellitus ([place])   . Environmental allergies   . Hypertension    Social History   Socioeconomic History  . Marital status: Widowed    Spouse name: Not on file  . Number of children: Not on file  . Years of education: Not on file  . Highest education level: Not on file  Social Needs  . Financial resource strain: Not on file  . Food insecurity - worry: Not on file  . Food insecurity - inability: Not on file  . Transportation needs - medical: Not  on file  . Transportation needs - non-medical: Not on file  Occupational History  . Not on file  Tobacco Use  . Smoking status: Never Smoker  . Smokeless tobacco: Never Used  Substance and Sexual Activity  . Alcohol use: No    Alcohol/week: 0.0 oz  . Drug use: No  . Sexual activity: No  Other Topics Concern  . Not on file  Social History Narrative  . Not on file   Past Surgical History:  Procedure Laterality Date  . APPENDECTOMY    . TUBAL LIGATION     Family History  Problem Relation Age of Onset  . Hypertension Mother   . Cancer Brother         lung (question)  . Cancer Sister        questionable type   Allergies  Allergen Reactions  . Ace Inhibitors Other (See Comments)    Angioedema  . Hydrochlorothiazide W-Triamterene Other (See Comments)    Gout flare  . Lisinopril Swelling and Other (See Comments)  . Penicillins Swelling  . Trichlormethiazide Other (See Comments)    Gout flare      Assessment & Plan:  Presents as a new patient referred by Dr. Nicki Reaper for evaluation of lower extremity edema and discomfort.  The patient was seen with family member.  The patient endorses a progressively worsening discomfort associated with bilateral lower extremity edema.  The patient notes that her pain is worse at night.  The patient notes that her edema is also worse towards the evening.  The patient notes a weakness to the bilateral lower extremity as well.  The patient does not engage in conservative therapy at this time.  The patient denies any rest pain or ulceration to the lower extremity.  The patient does note that the "throbbing" in her lower extremity at night does keep her awake.  The patient denies any trauma or surgery to the bilateral lower extremity.  The patient denies any DVT history to the bilateral lower extremity.  The patient denies any fever, nausea vomiting.  1. Pain in both lower extremities - New The patient has a past medical history of peripheral artery disease Hard to palpate pedal pulses on exam however I do feel this is because of the patient's uncontrolled edema I will bring the patient back to undergo an ABI to assess for any contributing peripheral artery disease I have discussed with the patient at length the risk factors for and pathogenesis of atherosclerotic disease and encouraged a healthy diet, regular exercise regimen and blood pressure / glucose control.  The patient was encouraged to call the office in the interim if he experiences any claudication like symptoms, rest pain or ulcers to her feet /  toes.  2. Bilateral lower extremity edema - New The patient was encouraged to wear graduated compression stockings (20-30 mmHg) on a daily basis. The patient was instructed to begin wearing the stockings first thing in the morning and removing them in the evening. The patient was instructed specifically not to sleep in the stockings. Prescription given. We had a long discussion about conventional compression stockings first [place] wraps. In addition, behavioral modification including elevation during the day will be initiated. Anti-inflammatories for pain. The patient will follow up in one months to asses conservative management.  The patient will most likely benefit from a lymphedema pump as an added therapy in the near future. I will bring the patient back in approximately 1 month to undergo a venous duplex to  rule out reflux The patient was instructed to call the office in the interim if any worsening edema or ulcerations to the legs, feet or toes occurs. The patient expresses their understanding.  3. Diabetes mellitus with peripheral vascular disease ([place]) - Stable Encouraged good control as its slows the progression of atherosclerotic disease  4. Hypercholesteremia - Stable Encouraged good control as its slows the progression of atherosclerotic disease  Current Outpatient Medications on File Prior to Visit  Medication Sig Dispense Refill  . acetaminophen (TYLENOL) 325 MG tablet Take 650 mg by mouth every 6 (six) hours as needed.    Marland Kitchen amLODipine (NORVASC) 5 MG tablet Take 1 tablet (5 mg total) by mouth daily. 30 tablet 0  . aspirin 81 MG tablet Take 81 mg by mouth daily.    . Calcium Carbonate-Vitamin D (CALTRATE 600+D PO) Take by mouth.    . fexofenadine (ALLEGRA) 180 MG tablet Take 1 tablet (180 mg total) daily by mouth. 30 tablet 3  . fluticasone (FLONASE) 50 MCG/ACT nasal spray Place 2 sprays daily into both nostrils. 16 g 6  . furosemide (LASIX) 20 MG tablet Take 1 tablet (20 mg  total) by mouth daily. 30 tablet 1  . glucose blood (ACCU-CHEK AVIVA PLUS) test strip CHECK SUGAR ONCE DAILY 100 each 6  . hydrALAZINE (APRESOLINE) 50 MG tablet TAKE ONE (1) TABLET THREE (3) TIMES EACH DAY 90 tablet 0  . ipratropium (ATROVENT HFA) 17 MCG/ACT inhaler Inhale 1 puff every 6 (six) hours as needed into the lungs for wheezing.    . metoprolol succinate (TOPROL-XL) 25 MG 24 hr tablet Take 1 tablet (25 mg total) by mouth 2 (two) times daily. 60 tablet 3  . Multiple Vitamin (MULTIVITAMIN) tablet Take 1 tablet by mouth daily. Reported on 12/24/2015    . potassium chloride (K-DUR) 10 MEQ tablet Take 1 tablet (10 mEq total) by mouth daily. 30 tablet 1  . ranitidine (ZANTAC) 150 MG capsule Take 150 mg by mouth 2 (two) times daily.    Marland Kitchen SYNTHROID 50 MCG tablet TAKE ONE (1) TABLET EACH DAY 90 tablet 2  . Cholecalciferol (D3 SUPER STRENGTH) 2000 UNITS CAPS Take by mouth.     No current facility-administered medications on file prior to visit.    There are no Patient Instructions on file for this visit. No Follow-up on file.  Jojo Geving A Nastassia Bazaldua, PA-C

## 2017-09-08 ENCOUNTER — Telehealth: Payer: Self-pay | Admitting: Radiology

## 2017-09-08 ENCOUNTER — Other Ambulatory Visit: Payer: Self-pay | Admitting: Internal Medicine

## 2017-09-08 DIAGNOSIS — I10 Essential (primary) hypertension: Secondary | ICD-10-CM

## 2017-09-08 DIAGNOSIS — E876 Hypokalemia: Secondary | ICD-10-CM

## 2017-09-08 NOTE — Telephone Encounter (Signed)
Pt coming in for labs tomorrow please place future orders. Thank you.

## 2017-09-08 NOTE — Progress Notes (Signed)
Order placed for f/u met b 

## 2017-09-08 NOTE — Telephone Encounter (Signed)
Order placed for f/u met b 

## 2017-09-09 ENCOUNTER — Other Ambulatory Visit (INDEPENDENT_AMBULATORY_CARE_PROVIDER_SITE_OTHER): Payer: Medicare Other

## 2017-09-09 DIAGNOSIS — E876 Hypokalemia: Secondary | ICD-10-CM

## 2017-09-09 DIAGNOSIS — I10 Essential (primary) hypertension: Secondary | ICD-10-CM | POA: Diagnosis not present

## 2017-09-09 LAB — BASIC METABOLIC PANEL
BUN: 32 mg/dL — ABNORMAL HIGH (ref 6–23)
CHLORIDE: 106 meq/L (ref 96–112)
CO2: 27 meq/L (ref 19–32)
Calcium: 8.7 mg/dL (ref 8.4–10.5)
Creatinine, Ser: 0.95 mg/dL (ref 0.40–1.20)
GFR: 69.92 mL/min (ref >60.00)
GLUCOSE: 134 mg/dL — AB (ref 70–99)
Potassium: 4.3 mEq/L (ref 3.5–5.1)
Sodium: 141 mEq/L (ref 135–145)

## 2017-09-10 ENCOUNTER — Other Ambulatory Visit: Payer: Self-pay | Admitting: Internal Medicine

## 2017-09-10 DIAGNOSIS — R600 Localized edema: Secondary | ICD-10-CM

## 2017-09-10 DIAGNOSIS — I10 Essential (primary) hypertension: Secondary | ICD-10-CM

## 2017-09-10 NOTE — Progress Notes (Signed)
Order placed for f/u met b 

## 2017-09-17 ENCOUNTER — Telehealth: Payer: Self-pay | Admitting: Internal Medicine

## 2017-09-17 NOTE — Telephone Encounter (Signed)
See result note.  

## 2017-09-17 NOTE — Telephone Encounter (Signed)
Copied from [place] #[id number]. Topic: General - Other >> Sep 16, 2017  2:42 PM Cecelia Byars, NT wrote: Reason for CRM: Patients daughter called and said the office called and said to call back , please call   Wynona Luna  the  patients daughter   [phone]

## 2017-09-22 DIAGNOSIS — S62644A Nondisplaced fracture of proximal phalanx of right ring finger, initial encounter for closed fracture: Secondary | ICD-10-CM | POA: Diagnosis not present

## 2017-09-23 ENCOUNTER — Telehealth: Payer: Self-pay | Admitting: Internal Medicine

## 2017-09-23 ENCOUNTER — Other Ambulatory Visit: Payer: Self-pay | Admitting: Internal Medicine

## 2017-09-23 NOTE — Telephone Encounter (Signed)
Please advise 

## 2017-09-23 NOTE — Telephone Encounter (Signed)
I think this may be just an FYI but wanted you to be aware

## 2017-09-23 NOTE — Telephone Encounter (Signed)
Has a history of diabetes and hypertension.  Following renal function.

## 2017-09-23 NOTE — Telephone Encounter (Signed)
Copied from [place] [id number]. Topic: General - Other >> Sep 23, 2017  9:32 AM Margot Ables wrote: Caller name: Wendee Beavers, NP with [facility] Can be reached: [phone]  Reason for call: 1 time per year [facility] sends provider to visit pt and leave msg for PCP - Urine dipstick showed 2+ protein, not sure of documented history for chronic kidney disease, please advise pt if follow up needed. Ok to call with questions. Ms. Kimble is stable and no problems.

## 2017-09-30 ENCOUNTER — Other Ambulatory Visit (INDEPENDENT_AMBULATORY_CARE_PROVIDER_SITE_OTHER): Payer: Medicare Other

## 2017-09-30 DIAGNOSIS — R600 Localized edema: Secondary | ICD-10-CM

## 2017-09-30 DIAGNOSIS — I10 Essential (primary) hypertension: Secondary | ICD-10-CM | POA: Diagnosis not present

## 2017-09-30 LAB — BASIC METABOLIC PANEL
BUN: 31 mg/dL — ABNORMAL HIGH (ref 6–23)
CHLORIDE: 106 meq/L (ref 96–112)
CO2: 28 meq/L (ref 19–32)
Calcium: 9.1 mg/dL (ref 8.4–10.5)
Creatinine, Ser: 0.94 mg/dL (ref 0.40–1.20)
GFR: 70.77 mL/min (ref >60.00)
Glucose, Bld: 198 mg/dL — ABNORMAL HIGH (ref 70–99)
Potassium: 4.5 mEq/L (ref 3.5–5.1)
SODIUM: 140 meq/L (ref 135–145)

## 2017-10-05 ENCOUNTER — Encounter: Payer: Self-pay | Admitting: *Deleted

## 2017-10-07 ENCOUNTER — Encounter: Payer: Self-pay | Admitting: Internal Medicine

## 2017-10-07 ENCOUNTER — Ambulatory Visit (INDEPENDENT_AMBULATORY_CARE_PROVIDER_SITE_OTHER): Payer: Medicare Other | Admitting: Internal Medicine

## 2017-10-07 VITALS — BP 164/86 | HR 67 | Temp 97.5°F | Resp 18 | Wt 91.4 lb

## 2017-10-07 DIAGNOSIS — I10 Essential (primary) hypertension: Secondary | ICD-10-CM

## 2017-10-07 DIAGNOSIS — E1151 Type 2 diabetes mellitus with diabetic peripheral angiopathy without gangrene: Secondary | ICD-10-CM | POA: Diagnosis not present

## 2017-10-07 DIAGNOSIS — M79605 Pain in left leg: Secondary | ICD-10-CM

## 2017-10-07 DIAGNOSIS — M79604 Pain in right leg: Secondary | ICD-10-CM | POA: Diagnosis not present

## 2017-10-07 DIAGNOSIS — R600 Localized edema: Secondary | ICD-10-CM | POA: Diagnosis not present

## 2017-10-07 DIAGNOSIS — E7800 Pure hypercholesterolemia, unspecified: Secondary | ICD-10-CM

## 2017-10-07 DIAGNOSIS — I739 Peripheral vascular disease, unspecified: Secondary | ICD-10-CM

## 2017-10-07 DIAGNOSIS — E039 Hypothyroidism, unspecified: Secondary | ICD-10-CM

## 2017-10-07 DIAGNOSIS — R350 Frequency of micturition: Secondary | ICD-10-CM | POA: Diagnosis not present

## 2017-10-07 DIAGNOSIS — R634 Abnormal weight loss: Secondary | ICD-10-CM | POA: Diagnosis not present

## 2017-10-07 DIAGNOSIS — J309 Allergic rhinitis, unspecified: Secondary | ICD-10-CM

## 2017-10-07 DIAGNOSIS — D649 Anemia, unspecified: Secondary | ICD-10-CM

## 2017-10-07 LAB — URINALYSIS, ROUTINE W REFLEX MICROSCOPIC
BILIRUBIN URINE: NEGATIVE
Hgb urine dipstick: NEGATIVE
Ketones, ur: NEGATIVE
Leukocytes, UA: NEGATIVE
NITRITE: NEGATIVE
RBC / HPF: NONE SEEN (ref 0–?)
Specific Gravity, Urine: 1.02 (ref 1.000–1.030)
Total Protein, Urine: 30 — AB
URINE GLUCOSE: NEGATIVE
Urobilinogen, UA: 0.2 (ref 0.0–1.0)
pH: 5.5 (ref 5.0–8.0)

## 2017-10-07 NOTE — Progress Notes (Signed)
Patient ID: EVERLENE CUNNING, female   DOB: 1917-08-24, 84 y.o.   MRN: [id number]   Subjective:    Patient ID: [name], female    DOB: [birth year], 84 y.o.   MRN: [id number]  HPI  Patient here for a scheduled follow up.  She is accompanied by her daughter.  History obtained from both of them.  She reports some increased nasal congestion.  No chest congestion.  No sob.  No chest pain.  No acid reflux.  No abdominal pain.  Bowels moving.  Blood pressure elevated.  States blood sugars doing ok.  Some increased leg discomfort.  Discussed multiple etiologies.  Has back issues.  Also has known PAD.  She is eating.  Discussed adding nutritional supplements to her diet.     Past Medical History:  Diagnosis Date  . Asthma   . Diabetes mellitus ([place])   . Environmental allergies   . Hypertension    Past Surgical History:  Procedure Laterality Date  . APPENDECTOMY    . TUBAL LIGATION     Family History  Problem Relation Age of Onset  . Hypertension Mother   . Cancer Brother        lung (question)  . Cancer Sister        questionable type   Social History   Socioeconomic History  . Marital status: Widowed    Spouse name: None  . Number of children: None  . Years of education: None  . Highest education level: None  Social Needs  . Financial resource strain: None  . Food insecurity - worry: None  . Food insecurity - inability: None  . Transportation needs - medical: None  . Transportation needs - non-medical: None  Occupational History  . None  Tobacco Use  . Smoking status: Never Smoker  . Smokeless tobacco: Never Used  Substance and Sexual Activity  . Alcohol use: No    Alcohol/week: 0.0 oz  . Drug use: No  . Sexual activity: No  Other Topics Concern  . None  Social History Narrative  . None    Outpatient Encounter Medications as of 10/07/2017  Medication Sig  . acetaminophen (TYLENOL) 325 MG tablet Take 650 mg by mouth every 6 (six) hours as needed.  Marland Kitchen amLODipine  (NORVASC) 5 MG tablet Take 1 tablet (5 mg total) by mouth daily.  Marland Kitchen aspirin 81 MG tablet Take 81 mg by mouth daily.  . Calcium Carbonate-Vitamin D (CALTRATE 600+D PO) Take by mouth.  . Cholecalciferol (D3 SUPER STRENGTH) 2000 UNITS CAPS Take by mouth.  . fexofenadine (ALLEGRA) 180 MG tablet Take 1 tablet (180 mg total) daily by mouth.  . fluticasone (FLONASE) 50 MCG/ACT nasal spray Place 2 sprays daily into both nostrils.  . furosemide (LASIX) 20 MG tablet Take 1 tablet (20 mg total) by mouth daily.  Marland Kitchen glucose blood (ACCU-CHEK AVIVA PLUS) test strip CHECK SUGAR ONCE DAILY  . hydrALAZINE (APRESOLINE) 50 MG tablet TAKE ONE (1) TABLET THREE (3) TIMES EACH DAY  . ipratropium (ATROVENT HFA) 17 MCG/ACT inhaler Inhale 1 puff every 6 (six) hours as needed into the lungs for wheezing.  . metoprolol succinate (TOPROL-XL) 25 MG 24 hr tablet Take 1 tablet (25 mg total) by mouth 2 (two) times daily.  . potassium chloride (K-DUR) 10 MEQ tablet Take 1 tablet (10 mEq total) by mouth daily.  . ranitidine (ZANTAC) 150 MG capsule Take 150 mg by mouth 2 (two) times daily.  Marland Kitchen SYNTHROID 50 MCG tablet TAKE ONE (  1) TABLET EACH DAY  . [DISCONTINUED] Multiple Vitamin (MULTIVITAMIN) tablet Take 1 tablet by mouth daily. Reported on 12/24/2015   No facility-administered encounter medications on file as of 10/07/2017.     Review of Systems  Constitutional:       Weight is down a couple of more pounds.  States she is eating.    HENT: Positive for congestion and postnasal drip. Negative for sinus pressure.   Respiratory: Negative for cough, chest tightness and shortness of breath.   Cardiovascular: Negative for chest pain, palpitations and leg swelling.  Gastrointestinal: Negative for abdominal pain, diarrhea, nausea and vomiting.  Genitourinary: Negative for difficulty urinating and dysuria.  Musculoskeletal: Negative for joint swelling.       Leg pain as outlined.    Skin: Negative for color change and rash.    Neurological: Negative for dizziness, light-headedness and headaches.  Psychiatric/Behavioral: Negative for agitation and dysphoric mood.       Objective:     Blood pressure rechecked by me:  164/86  Physical Exam  Constitutional: She appears well-developed and well-nourished. No distress.  HENT:  Mouth/Throat: Oropharynx is clear and moist.  Nares - erythematous turbinates.    Neck: Neck supple. No thyromegaly present.  Cardiovascular: Normal rate and regular rhythm.  Pulmonary/Chest: Breath sounds normal. No respiratory distress. She has no wheezes.  Abdominal: Soft. Bowel sounds are normal. There is no tenderness.  Musculoskeletal: She exhibits no edema or tenderness.  Lymphadenopathy:    She has no cervical adenopathy.  Skin: No rash noted. No erythema.  Psychiatric: She has a normal mood and affect. Her behavior is normal.    BP (!) 164/86   Pulse 67   Temp (!) 97.5 F (36.4 C) (Oral)   Resp 18   Wt 91 lb 6.4 oz (41.5 kg)   SpO2 99%   BMI 19.10 kg/m  Wt Readings from Last 3 Encounters:  10/07/17 91 lb 6.4 oz (41.5 kg)  09/07/17 93 lb (42.2 kg)  08/25/17 97 lb 3.2 oz (44.1 kg)     Lab Results  Component Value Date   WBC 5.6 06/26/2017   HGB 12.2 06/26/2017   HCT 37.4 06/26/2017   PLT 236 06/26/2017   GLUCOSE 198 (H) 09/30/2017   CHOL 209 (H) 06/21/2014   TRIG 105.0 06/21/2014   HDL 77.00 06/21/2014   LDLDIRECT 107.1 07/29/2013   LDLCALC 111 (H) 06/21/2014   ALT 17 08/10/2017   AST 24 08/10/2017   NA 140 09/30/2017   K 4.5 09/30/2017   CL 106 09/30/2017   CREATININE 0.94 09/30/2017   BUN 31 (H) 09/30/2017   CO2 28 09/30/2017   TSH 4.72 (H) 08/10/2017   INR 1.0 01/25/2013   HGBA1C 6.4 03/10/2017   MICROALBUR 115.0 Repeated and verified X2. (H) 07/29/2013    US Venous Img Lower Bilateral  Result Date: 08/10/2017 CLINICAL DATA:  84 year old female with a history of bilateral lower extremity swelling. EXAM: BILATERAL LOWER EXTREMITY VENOUS  DOPPLER ULTRASOUND TECHNIQUE: Gray-scale sonography with graded compression, as well as color Doppler and duplex ultrasound were performed to evaluate the lower extremity deep venous systems from the level of the common femoral vein and including the common femoral, femoral, profunda femoral, popliteal and calf veins including the posterior tibial, peroneal and gastrocnemius veins when visible. The superficial great saphenous vein was also interrogated. Spectral Doppler was utilized to evaluate flow at rest and with distal augmentation maneuvers in the common femoral, femoral and popliteal veins. COMPARISON:  None. FINDINGS:  RIGHT LOWER EXTREMITY Common Femoral Vein: No evidence of thrombus. Normal compressibility, respiratory phasicity and response to augmentation. Saphenofemoral Junction: No evidence of thrombus. Normal compressibility and flow on color Doppler imaging. Profunda Femoral Vein: No evidence of thrombus. Normal compressibility and flow on color Doppler imaging. Femoral Vein: No evidence of thrombus. Normal compressibility, respiratory phasicity and response to augmentation. Popliteal Vein: No evidence of thrombus. Normal compressibility, respiratory phasicity and response to augmentation. Calf Veins: No evidence of thrombus. Normal compressibility and flow on color Doppler imaging. Superficial Great Saphenous Vein: No evidence of thrombus. Normal compressibility and flow on color Doppler imaging. Other Findings:  Edema LEFT LOWER EXTREMITY Common Femoral Vein: No evidence of thrombus. Normal compressibility, respiratory phasicity and response to augmentation. Saphenofemoral Junction: No evidence of thrombus. Normal compressibility and flow on color Doppler imaging. Profunda Femoral Vein: No evidence of thrombus. Normal compressibility and flow on color Doppler imaging. Femoral Vein: No evidence of thrombus. Normal compressibility, respiratory phasicity and response to augmentation. Popliteal Vein: No  evidence of thrombus. Normal compressibility, respiratory phasicity and response to augmentation. Calf Veins: No evidence of thrombus. Normal compressibility and flow on color Doppler imaging. Superficial Great Saphenous Vein: No evidence of thrombus. Normal compressibility and flow on color Doppler imaging. Other Findings:  Edema IMPRESSION: Sonographic survey of the bilateral lower extremities negative for DVT. Soft tissue edema of the lower legs. Electronically Signed   By: Corrie Mckusick D.O.   On: 08/10/2017 12:10       Assessment & Plan:   Problem List Items Addressed This Visit    Allergic rhinitis    She has not been using nasal spray on a regular basis.  Restart.  Follow.        Anemia    Follow cbc.        Bilateral lower extremity edema    Swelling improved.  On lasix.  Follow.        Diabetes mellitus with peripheral vascular disease ([facility])    No low sugars.  States sugars doing well.  Follow.        Hypercholesteremia    Follow lipid panel.        Hypertension    Blood pressure elevated despite multiple medications.  Discussed with her today.  Refer to nephrology for further evaluation and for recommendation for better management of her blood pressure.        Relevant Orders   Ambulatory referral to Nephrology   Hypothyroidism    On thyroid replacement.  Follow tsh.        Leg pain    Persistent leg pain. Swelling better.  Discussed her PAD.  She desires no further intervention.  Discussed alpha lipoic acid.  Discussed back issues contributing.  Tylenol prn.        Loss of weight    Weight is down a couple of more pounds.  Discussed her po intake.  Discussed adding nutritional supplements.  Follow.       Lower extremity edema    Improved.  On lasix.  Follow met b.       Peripheral vascular disease ([place])    Has been evaluated and followed by vascular surgery.  Swelling improved.  Tylenol prn.  Follow.         Other Visit Diagnoses    Urinary frequency     -  Primary   Relevant Orders   Urinalysis, Routine w reflex microscopic (Completed)   Urine Culture (Completed)  Einar Pheasant, MD

## 2017-10-08 LAB — URINE CULTURE
MICRO NUMBER: 90256339
Result:: NO GROWTH
SPECIMEN QUALITY: ADEQUATE

## 2017-10-10 NOTE — Assessment & Plan Note (Signed)
Improved.  On lasix.  Follow met b.

## 2017-10-10 NOTE — Assessment & Plan Note (Signed)
Blood pressure elevated despite multiple medications.  Discussed with her today.  Refer to nephrology for further evaluation and for recommendation for better management of her blood pressure.

## 2017-10-10 NOTE — Assessment & Plan Note (Signed)
No low sugars.  States sugars doing well.  Follow.

## 2017-10-10 NOTE — Assessment & Plan Note (Signed)
Weight is down a couple of more pounds.  Discussed her po intake.  Discussed adding nutritional supplements.  Follow.

## 2017-10-10 NOTE — Assessment & Plan Note (Signed)
She has not been using nasal spray on a regular basis.  Restart.  Follow.

## 2017-10-10 NOTE — Assessment & Plan Note (Signed)
On thyroid replacement.  Follow tsh.  

## 2017-10-10 NOTE — Assessment & Plan Note (Signed)
Swelling improved.  On lasix.  Follow.

## 2017-10-10 NOTE — Assessment & Plan Note (Signed)
Follow lipid panel.   

## 2017-10-10 NOTE — Assessment & Plan Note (Signed)
Follow cbc.  

## 2017-10-10 NOTE — Assessment & Plan Note (Signed)
Has been evaluated and followed by vascular surgery.  Swelling improved.  Tylenol prn.  Follow.

## 2017-10-10 NOTE — Assessment & Plan Note (Signed)
Persistent leg pain. Swelling better.  Discussed her PAD.  She desires no further intervention.  Discussed alpha lipoic acid.  Discussed back issues contributing.  Tylenol prn.

## 2017-10-12 ENCOUNTER — Ambulatory Visit (INDEPENDENT_AMBULATORY_CARE_PROVIDER_SITE_OTHER): Payer: Medicare Other

## 2017-10-12 ENCOUNTER — Ambulatory Visit (INDEPENDENT_AMBULATORY_CARE_PROVIDER_SITE_OTHER): Payer: Medicare Other | Admitting: Vascular Surgery

## 2017-10-12 ENCOUNTER — Encounter (INDEPENDENT_AMBULATORY_CARE_PROVIDER_SITE_OTHER): Payer: Self-pay | Admitting: Vascular Surgery

## 2017-10-12 VITALS — BP 229/83 | HR 61 | Resp 16 | Wt 91.2 lb

## 2017-10-12 DIAGNOSIS — R600 Localized edema: Secondary | ICD-10-CM

## 2017-10-12 DIAGNOSIS — E1151 Type 2 diabetes mellitus with diabetic peripheral angiopathy without gangrene: Secondary | ICD-10-CM

## 2017-10-12 DIAGNOSIS — M79604 Pain in right leg: Secondary | ICD-10-CM

## 2017-10-12 DIAGNOSIS — I10 Essential (primary) hypertension: Secondary | ICD-10-CM

## 2017-10-12 DIAGNOSIS — M79605 Pain in left leg: Secondary | ICD-10-CM

## 2017-10-12 DIAGNOSIS — I739 Peripheral vascular disease, unspecified: Secondary | ICD-10-CM

## 2017-10-12 DIAGNOSIS — E7800 Pure hypercholesterolemia, unspecified: Secondary | ICD-10-CM | POA: Diagnosis not present

## 2017-10-12 NOTE — Progress Notes (Signed)
MRN : [id number]  Tara [name] is a 84 y.o. (12/29/1917) female who presents with chief complaint of  Chief Complaint  Patient presents with  . Follow-up    1 month bil ven reflux  .  History of Present Illness:The patient returns to the office for followup and review of the noninvasive studies. There has been a significant deterioration in the lower extremity symptoms.  The patient notes right ankle rest pain symptoms. No new ulcers or wounds have occurred since the last visit.  There have been no significant changes to the patient's overall health care.  The patient denies amaurosis fugax or recent TIA symptoms. There are no recent neurological changes noted. The patient denies history of DVT, PE or superficial thrombophlebitis. The patient denies recent episodes of angina or shortness of breath.    Current Meds  Medication Sig  . acetaminophen (TYLENOL) 325 MG tablet Take 650 mg by mouth every 6 (six) hours as needed.  Marland Kitchen amLODipine (NORVASC) 5 MG tablet Take 1 tablet (5 mg total) by mouth daily.  Marland Kitchen aspirin 81 MG tablet Take 81 mg by mouth daily.  . Calcium Carbonate-Vitamin D (CALTRATE 600+D PO) Take by mouth.  . Cholecalciferol (D3 SUPER STRENGTH) 2000 UNITS CAPS Take by mouth.  . fexofenadine (ALLEGRA) 180 MG tablet Take 1 tablet (180 mg total) daily by mouth.  . fluticasone (FLONASE) 50 MCG/ACT nasal spray Place 2 sprays daily into both nostrils.  . furosemide (LASIX) 20 MG tablet Take 1 tablet (20 mg total) by mouth daily.  Marland Kitchen glucose blood (ACCU-CHEK AVIVA PLUS) test strip CHECK SUGAR ONCE DAILY  . hydrALAZINE (APRESOLINE) 50 MG tablet TAKE ONE (1) TABLET THREE (3) TIMES EACH DAY  . ipratropium (ATROVENT HFA) 17 MCG/ACT inhaler Inhale 1 puff every 6 (six) hours as needed into the lungs for wheezing.  . metoprolol succinate (TOPROL-XL) 25 MG 24 hr tablet Take 1 tablet (25 mg total) by mouth 2 (two) times daily.  . potassium chloride (K-DUR) 10 MEQ tablet Take 1 tablet (10  mEq total) by mouth daily.  . ranitidine (ZANTAC) 150 MG capsule Take 150 mg by mouth 2 (two) times daily.  Marland Kitchen SYNTHROID 50 MCG tablet TAKE ONE (1) TABLET EACH DAY    Past Medical History:  Diagnosis Date  . Asthma   . Diabetes mellitus ([place])   . Environmental allergies   . Hypertension     Past Surgical History:  Procedure Laterality Date  . APPENDECTOMY    . TUBAL LIGATION      Social History Social History   Tobacco Use  . Smoking status: Never Smoker  . Smokeless tobacco: Never Used  Substance Use Topics  . Alcohol use: No    Alcohol/week: 0.0 oz  . Drug use: No    Family History Family History  Problem Relation Age of Onset  . Hypertension Mother   . Cancer Brother        lung (question)  . Cancer Sister        questionable type    Allergies  Allergen Reactions  . Ace Inhibitors Other (See Comments)    Angioedema  . Hydrochlorothiazide W-Triamterene Other (See Comments)    Gout flare  . Lisinopril Swelling and Other (See Comments)  . Penicillins Swelling  . Trichlormethiazide Other (See Comments)    Gout flare     REVIEW OF SYSTEMS (Negative unless checked)  Constitutional: [] Weight loss  [] Fever  [] Chills Cardiac: [] Chest pain   [] Chest pressure   []   Palpitations   [] Shortness of breath when laying flat   [] Shortness of breath with exertion. Vascular:  [x] Pain in legs with walking   [x] Pain in legs at rest  [] History of DVT   [] Phlebitis   [] Swelling in legs   [] Varicose veins   [] Non-healing ulcers Pulmonary:   [] Uses home oxygen   [] Productive cough   [] Hemoptysis   [] Wheeze  [] COPD   [] Asthma Neurologic:  [] Dizziness   [] Seizures   [] History of stroke   [] History of TIA  [] Aphasia   [] Vissual changes   [] Weakness or numbness in arm   [] Weakness or numbness in leg Musculoskeletal:   [x] Joint swelling   [x] Joint pain   [] Low back pain Hematologic:  [] Easy bruising  [] Easy bleeding   [] Hypercoagulable state   [] Anemic Gastrointestinal:  [] Diarrhea    [] Vomiting  [] Gastroesophageal reflux/heartburn   [] Difficulty swallowing. Genitourinary:  [] Chronic kidney disease   [] Difficult urination  [] Frequent urination   [] Blood in urine Skin:  [] Rashes   [] Ulcers  Psychological:  [] History of anxiety   []  History of major depression.  Physical Examination  Vitals:   10/12/17 1142  BP: (!) 229/83  Pulse: 61  Resp: 16  Weight: 91 lb 3.2 oz (41.4 kg)   Body mass index is 19.06 kg/m. Gen: WD/WN, NAD Head: [place]/AT, No temporalis wasting.  Ear/Nose/Throat: Hearing grossly intact, nares w/o erythema or drainage Eyes: PER, EOMI, sclera nonicteric.  Neck: Supple, no large masses.   Pulmonary:  Good air movement, no audible wheezing bilaterally, no use of accessory muscles.  Cardiac: RRR, no JVD Vascular:  Vessel Right Left  Radial Palpable Palpable  PT Not Palpable Not Palpable  DP Not Palpable Not Palpable  Gastrointestinal: Non-distended. No guarding/no peritoneal signs.  Musculoskeletal: M/S 5/5 throughout.  No deformity or atrophy.  Neurologic: CN 2-12 intact. Symmetrical.  Speech is fluent. Motor exam as listed above. Psychiatric: Judgment intact, Mood & affect appropriate for pt's clinical situation. Dermatologic: No rashes or ulcers noted.  No changes consistent with cellulitis. Lymph : No lichenification or skin changes of chronic lymphedema.  CBC Lab Results  Component Value Date   WBC 5.6 06/26/2017   HGB 12.2 06/26/2017   HCT 37.4 06/26/2017   MCV 92.9 06/26/2017   PLT 236 06/26/2017    BMET    Component Value Date/Time   NA 140 09/30/2017 1410   NA 141 07/06/2014 0736   K 4.5 09/30/2017 1410   K 3.7 07/06/2014 0736   CL 106 09/30/2017 1410   CL 107 07/06/2014 0736   CO2 28 09/30/2017 1410   CO2 29 07/06/2014 0736   GLUCOSE 198 (H) 09/30/2017 1410   GLUCOSE 126 (H) 07/06/2014 0736   BUN 31 (H) 09/30/2017 1410   BUN 27 (H) 07/06/2014 0736   CREATININE 0.94 09/30/2017 1410   CREATININE 0.71 08/10/2017 1032    CALCIUM 9.1 09/30/2017 1410   CALCIUM 9.1 07/06/2014 0736   GFRNONAA 56 (L) 06/26/2017 2122   GFRNONAA >60 07/06/2014 0736   GFRNONAA >60 10/19/2013 1243   GFRAA >60 06/26/2017 2122   GFRAA >60 07/06/2014 0736   GFRAA >60 10/19/2013 1243   Estimated Creatinine Clearance: 22.1 mL/min (by C-G formula based on SCr of 0.94 mg/dL).  COAG Lab Results  Component Value Date   INR 1.0 01/25/2013    Radiology No results found.   Assessment/Plan 1. Pain in both lower extremities Recommend:  Patient should undergo arterial duplex of the right lower extremity ASAP because there has been a  significant deterioration in the patient's lower extremity symptoms.  The patient states they are having increased pain and a marked decrease in the distance that they can walk.  The risks and benefits as well as the alternatives were discussed in detail with the patient.  All questions were answered.  Patient agrees to proceed and understands this could be a prelude to angiography and intervention.  The patient will follow up with me in the office to review the studies.   2. Peripheral vascular disease ([place]) See #1  3. Diabetes mellitus with peripheral vascular disease ([place]) Continue hypoglycemic medications as already ordered, these medications have been reviewed and there are no changes at this time.  Hgb A1C to be monitored as already arranged by primary service   4. Essential hypertension Continue antihypertensive medications as already ordered, these medications have been reviewed and there are no changes at this time.   5. Hypercholesteremia Continue statin as ordered and reviewed, no changes at this time    Hortencia Pilar, MD  10/12/2017 8:45 PM

## 2017-10-13 ENCOUNTER — Other Ambulatory Visit: Payer: Self-pay | Admitting: Family Medicine

## 2017-10-13 ENCOUNTER — Other Ambulatory Visit: Payer: Self-pay | Admitting: Internal Medicine

## 2017-10-13 DIAGNOSIS — S62644D Nondisplaced fracture of proximal phalanx of right ring finger, subsequent encounter for fracture with routine healing: Secondary | ICD-10-CM | POA: Diagnosis not present

## 2017-10-13 NOTE — Telephone Encounter (Signed)
Filled: 06/17/2017 by Dr. Deborra Medina at [place]: 10/07/2017 Next OV: not scheduled

## 2017-10-15 DIAGNOSIS — B351 Tinea unguium: Secondary | ICD-10-CM | POA: Diagnosis not present

## 2017-10-15 DIAGNOSIS — E1159 Type 2 diabetes mellitus with other circulatory complications: Secondary | ICD-10-CM | POA: Diagnosis not present

## 2017-10-16 ENCOUNTER — Other Ambulatory Visit (INDEPENDENT_AMBULATORY_CARE_PROVIDER_SITE_OTHER): Payer: Self-pay | Admitting: Vascular Surgery

## 2017-10-16 DIAGNOSIS — M79604 Pain in right leg: Secondary | ICD-10-CM

## 2017-10-19 ENCOUNTER — Ambulatory Visit (INDEPENDENT_AMBULATORY_CARE_PROVIDER_SITE_OTHER): Payer: Medicare Other

## 2017-10-19 ENCOUNTER — Encounter (INDEPENDENT_AMBULATORY_CARE_PROVIDER_SITE_OTHER): Payer: Self-pay | Admitting: Vascular Surgery

## 2017-10-19 ENCOUNTER — Ambulatory Visit (INDEPENDENT_AMBULATORY_CARE_PROVIDER_SITE_OTHER): Payer: Medicare Other | Admitting: Vascular Surgery

## 2017-10-19 VITALS — BP 216/87 | HR 72 | Resp 20 | Wt 90.0 lb

## 2017-10-19 DIAGNOSIS — E7800 Pure hypercholesterolemia, unspecified: Secondary | ICD-10-CM | POA: Diagnosis not present

## 2017-10-19 DIAGNOSIS — M79604 Pain in right leg: Secondary | ICD-10-CM | POA: Diagnosis not present

## 2017-10-19 DIAGNOSIS — I739 Peripheral vascular disease, unspecified: Secondary | ICD-10-CM

## 2017-10-19 DIAGNOSIS — E1151 Type 2 diabetes mellitus with diabetic peripheral angiopathy without gangrene: Secondary | ICD-10-CM

## 2017-10-19 NOTE — Progress Notes (Signed)
Subjective:    Patient ID: Tara [name], female    DOB: 10/06/1917, 84 y.o.   MRN: [id number] Chief Complaint  Patient presents with  . Follow-up    ASAP RLE arterial duplex   Patient presents to review vascular studies.  The patient was last seen on October 12, 2017 for evaluation of progressively worsening right lower extremity pain.  The patient's pain is mostly located around her right ankle.  The patient underwent a right lower extremity arterial duplex which was notable for doppler velocities suggesting 50-74% stenosis in the right popliteal artery.  Biphasic blood flow distally.  The patient denies any rest pain or ulceration to the bilateral lower extremity.  The patient denies any fever, nausea vomiting.   Review of Systems  Constitutional: Negative.   HENT: Negative.   Eyes: Negative.   Respiratory: Negative.   Cardiovascular:       Right lower extremity claudication  Gastrointestinal: Negative.   Endocrine: Negative.   Genitourinary: Negative.   Musculoskeletal: Negative.   Skin: Negative.   Allergic/Immunologic: Negative.   Neurological: Negative.   Hematological: Negative.   Psychiatric/Behavioral: Negative.       Objective:   Physical Exam  Constitutional: She is oriented to person, place, and time. She appears well-developed and well-nourished. No distress.  HENT:  Head: Normocephalic and atraumatic.  Eyes: Conjunctivae are normal. Pupils are equal, round, and reactive to light.  Neck: Normal range of motion.  Cardiovascular: Normal rate, regular rhythm, normal heart sounds and intact distal pulses.  Pulses:      Radial pulses are 2+ on the right side, and 2+ on the left side.  Hard to palpate pedal pulses however the bilateral feet are warm  Pulmonary/Chest: Effort normal and breath sounds normal.  Musculoskeletal: Normal range of motion. She exhibits no edema.  Neurological: She is alert and oriented to person, place, and time.  Skin: Skin is warm and dry.  She is not diaphoretic.  Psychiatric: She has a normal mood and affect. Her behavior is normal. Judgment and thought content normal.  Vitals reviewed.  BP (!) 216/87 (BP Location: Left Arm, Patient Position: Sitting)   Pulse 72   Resp 20   Wt 90 lb (40.8 kg)   BMI 18.81 kg/m   Past Medical History:  Diagnosis Date  . Asthma   . Diabetes mellitus ([place])   . Environmental allergies   . Hypertension    Social History   Socioeconomic History  . Marital status: Widowed    Spouse name: Not on file  . Number of children: Not on file  . Years of education: Not on file  . Highest education level: Not on file  Social Needs  . Financial resource strain: Not on file  . Food insecurity - worry: Not on file  . Food insecurity - inability: Not on file  . Transportation needs - medical: Not on file  . Transportation needs - non-medical: Not on file  Occupational History  . Not on file  Tobacco Use  . Smoking status: Never Smoker  . Smokeless tobacco: Never Used  Substance and Sexual Activity  . Alcohol use: No    Alcohol/week: 0.0 oz  . Drug use: No  . Sexual activity: No  Other Topics Concern  . Not on file  Social History Narrative  . Not on file   Past Surgical History:  Procedure Laterality Date  . APPENDECTOMY    . TUBAL LIGATION     Family History  Problem Relation Age of Onset  . Hypertension Mother   . Cancer Brother        lung (question)  . Cancer Sister        questionable type   Allergies  Allergen Reactions  . Ace Inhibitors Other (See Comments)    Angioedema  . Hydrochlorothiazide W-Triamterene Other (See Comments)    Gout flare  . Lisinopril Swelling and Other (See Comments)  . Penicillins Swelling  . Trichlormethiazide Other (See Comments)    Gout flare      Assessment & Plan:  Patient presents to review vascular studies.  The patient was last seen on October 12, 2017 for evaluation of progressively worsening right lower extremity pain.  The  patient's pain is mostly located around her right ankle.  The patient underwent a right lower extremity arterial duplex which was notable for doppler velocities suggesting 50-74% stenosis in the right popliteal artery.  Biphasic blood flow distally.  The patient denies any rest pain or ulceration to the bilateral lower extremity.  The patient denies any fever, nausea vomiting.  1. Peripheral vascular disease ([place]) - Stable Patient found to have stenosis to the right popliteal artery I do not feel the patient's right lower extremity pain is solely from this area of stenosis.  I think there is degenerative joint disease and osteoarthritis contributing to her discomfort I do recommend undergoing a right lower extremity angiogram to assess the patient's anatomy and degree of stenosis and if appropriate we can revascularize at that time Procedure, risks and benefits explained to the patient [name] questions answered The patient was seen with 2 family members.  At this time the patient is not comfortable moving forward with the angiogram. I have given the patient information about angiograms and the name of the procedure so she can discuss this with her other family members If the patient would like to move forward she is to call the office and we will schedule her for right lower extremity angiogram with possible intervention for atherosclerotic disease with claudication If not the patient is to follow-up in 6 months for repeat studies Patient and her family members expressed their understanding  - VAS [place] ABI WITH/WO TBI; Future - VAS [place] LOWER EXTREMITY ARTERIAL DUPLEX; Future  2. Diabetes mellitus with peripheral vascular disease ([facility]) - Stable No family history of bleeding/clotting disorders, porphyria or autoimmune disease  3. Hypercholesteremia - Stable Encouraged good control as its slows the progression of atherosclerotic disease  Current Outpatient Medications on File Prior to Visit    Medication Sig Dispense Refill  . acetaminophen (TYLENOL) 325 MG tablet Take 650 mg by mouth every 6 (six) hours as needed.    Marland Kitchen amLODipine (NORVASC) 5 MG tablet Take 1 tablet (5 mg total) by mouth daily. 30 tablet 0  . aspirin 81 MG tablet Take 81 mg by mouth daily.    . Calcium Carbonate-Vitamin D (CALTRATE 600+D PO) Take by mouth.    . Cholecalciferol (D3 SUPER STRENGTH) 2000 UNITS CAPS Take by mouth.    . fexofenadine (ALLEGRA) 180 MG tablet Take 1 tablet (180 mg total) by mouth daily. 30 tablet 2  . fluticasone (FLONASE) 50 MCG/ACT nasal spray Place 2 sprays daily into both nostrils. 16 g 6  . furosemide (LASIX) 20 MG tablet Take 1 tablet (20 mg total) by mouth daily. 30 tablet 1  . glucose blood (ACCU-CHEK AVIVA PLUS) test strip CHECK SUGAR ONCE DAILY 100 each 6  . hydrALAZINE (APRESOLINE) 50 MG tablet TAKE  ONE (1) TABLET THREE (3) TIMES EACH DAY 90 tablet 1  . ipratropium (ATROVENT HFA) 17 MCG/ACT inhaler Inhale 1 puff every 6 (six) hours as needed into the lungs for wheezing.    . metoprolol succinate (TOPROL-XL) 25 MG 24 hr tablet Take 1 tablet (25 mg total) by mouth 2 (two) times daily. 60 tablet 3  . potassium chloride (K-DUR) 10 MEQ tablet Take 1 tablet (10 mEq total) by mouth daily. 30 tablet 1  . ranitidine (ZANTAC) 150 MG capsule Take 150 mg by mouth 2 (two) times daily.    Marland Kitchen SYNTHROID 50 MCG tablet TAKE ONE (1) TABLET EACH DAY 90 tablet 1   No current facility-administered medications on file prior to visit.    There are no Patient Instructions on file for this visit. No Follow-up on file.  Jahel Wavra A Taysen Bushart, PA-C

## 2017-10-23 ENCOUNTER — Other Ambulatory Visit: Payer: Self-pay | Admitting: Internal Medicine

## 2017-10-30 ENCOUNTER — Emergency Department: Payer: Medicare Other

## 2017-10-30 ENCOUNTER — Other Ambulatory Visit: Payer: Self-pay

## 2017-10-30 ENCOUNTER — Observation Stay
Admission: EM | Admit: 2017-10-30 | Discharge: 2017-11-02 | Disposition: A | Discharge status: Skilled Nursing Facility | Payer: Medicare Other | Attending: Internal Medicine | Admitting: Internal Medicine

## 2017-10-30 ENCOUNTER — Encounter: Payer: Self-pay | Admitting: Emergency Medicine

## 2017-10-30 DIAGNOSIS — S8001XA Contusion of right knee, initial encounter: Secondary | ICD-10-CM | POA: Insufficient documentation

## 2017-10-30 DIAGNOSIS — S79912A Unspecified injury of left hip, initial encounter: Secondary | ICD-10-CM | POA: Diagnosis not present

## 2017-10-30 DIAGNOSIS — I10 Essential (primary) hypertension: Secondary | ICD-10-CM | POA: Diagnosis not present

## 2017-10-30 DIAGNOSIS — S32592A Other specified fracture of left pubis, initial encounter for closed fracture: Principal | ICD-10-CM | POA: Insufficient documentation

## 2017-10-30 DIAGNOSIS — R079 Chest pain, unspecified: Secondary | ICD-10-CM | POA: Diagnosis not present

## 2017-10-30 DIAGNOSIS — D649 Anemia, unspecified: Secondary | ICD-10-CM | POA: Insufficient documentation

## 2017-10-30 DIAGNOSIS — Z79899 Other long term (current) drug therapy: Secondary | ICD-10-CM | POA: Diagnosis not present

## 2017-10-30 DIAGNOSIS — R748 Abnormal levels of other serum enzymes: Secondary | ICD-10-CM | POA: Diagnosis not present

## 2017-10-30 DIAGNOSIS — R262 Difficulty in walking, not elsewhere classified: Secondary | ICD-10-CM | POA: Diagnosis not present

## 2017-10-30 DIAGNOSIS — E039 Hypothyroidism, unspecified: Secondary | ICD-10-CM | POA: Insufficient documentation

## 2017-10-30 DIAGNOSIS — Z681 Body mass index (BMI) 19 or less, adult: Secondary | ICD-10-CM | POA: Insufficient documentation

## 2017-10-30 DIAGNOSIS — M25552 Pain in left hip: Secondary | ICD-10-CM | POA: Diagnosis not present

## 2017-10-30 DIAGNOSIS — R7989 Other specified abnormal findings of blood chemistry: Secondary | ICD-10-CM

## 2017-10-30 DIAGNOSIS — M25462 Effusion, left knee: Secondary | ICD-10-CM | POA: Diagnosis not present

## 2017-10-30 DIAGNOSIS — E1151 Type 2 diabetes mellitus with diabetic peripheral angiopathy without gangrene: Secondary | ICD-10-CM | POA: Diagnosis not present

## 2017-10-30 DIAGNOSIS — Z79890 Hormone replacement therapy (postmenopausal): Secondary | ICD-10-CM | POA: Diagnosis not present

## 2017-10-30 DIAGNOSIS — R778 Other specified abnormalities of plasma proteins: Secondary | ICD-10-CM

## 2017-10-30 DIAGNOSIS — I160 Hypertensive urgency: Secondary | ICD-10-CM | POA: Diagnosis present

## 2017-10-30 DIAGNOSIS — S32502A Unspecified fracture of left pubis, initial encounter for closed fracture: Secondary | ICD-10-CM | POA: Diagnosis not present

## 2017-10-30 DIAGNOSIS — S8991XA Unspecified injury of right lower leg, initial encounter: Secondary | ICD-10-CM | POA: Diagnosis not present

## 2017-10-30 DIAGNOSIS — W19XXXA Unspecified fall, initial encounter: Secondary | ICD-10-CM | POA: Insufficient documentation

## 2017-10-30 DIAGNOSIS — M810 Age-related osteoporosis without current pathological fracture: Secondary | ICD-10-CM | POA: Diagnosis not present

## 2017-10-30 DIAGNOSIS — E43 Unspecified severe protein-calorie malnutrition: Secondary | ICD-10-CM

## 2017-10-30 DIAGNOSIS — Z7982 Long term (current) use of aspirin: Secondary | ICD-10-CM | POA: Diagnosis not present

## 2017-10-30 DIAGNOSIS — M25551 Pain in right hip: Secondary | ICD-10-CM | POA: Diagnosis not present

## 2017-10-30 DIAGNOSIS — S79911A Unspecified injury of right hip, initial encounter: Secondary | ICD-10-CM | POA: Diagnosis not present

## 2017-10-30 DIAGNOSIS — M25561 Pain in right knee: Secondary | ICD-10-CM | POA: Diagnosis present

## 2017-10-30 DIAGNOSIS — Z9109 Other allergy status, other than to drugs and biological substances: Secondary | ICD-10-CM | POA: Diagnosis not present

## 2017-10-30 DIAGNOSIS — S8000XA Contusion of unspecified knee, initial encounter: Secondary | ICD-10-CM

## 2017-10-30 DIAGNOSIS — E860 Dehydration: Secondary | ICD-10-CM | POA: Insufficient documentation

## 2017-10-30 LAB — CBC
HCT: 35.3 % (ref 35.0–47.0)
HEMOGLOBIN: 12.1 g/dL (ref 12.0–16.0)
MCH: 31.6 pg (ref 26.0–34.0)
MCHC: 34.3 g/dL (ref 32.0–36.0)
MCV: 92.1 fL (ref 80.0–100.0)
Platelets: 226 10*3/uL (ref 150–440)
RBC: 3.83 MIL/uL (ref 3.80–5.20)
RDW: 15.7 % — AB (ref 11.5–14.5)
WBC: 10 10*3/uL (ref 3.6–11.0)

## 2017-10-30 LAB — BASIC METABOLIC PANEL
Anion gap: 8 (ref 5–15)
BUN: 32 mg/dL — ABNORMAL HIGH (ref 6–20)
CALCIUM: 9.1 mg/dL (ref 8.9–10.3)
CO2: 24 mmol/L (ref 22–32)
Chloride: 108 mmol/L (ref 101–111)
Creatinine, Ser: 0.83 mg/dL (ref 0.44–1.00)
GFR calc Af Amer: >60 mL/min (ref >60)
GFR, EST NON AFRICAN AMERICAN: 57 mL/min — AB (ref >60)
GLUCOSE: 192 mg/dL — AB (ref 65–99)
Potassium: 4.2 mmol/L (ref 3.5–5.1)
Sodium: 140 mmol/L (ref 135–145)

## 2017-10-30 LAB — TROPONIN I: TROPONIN I: 0.03 ng/mL — AB (ref <0.03)

## 2017-10-30 MED ORDER — HYDRALAZINE HCL 50 MG PO TABS
ORAL_TABLET | ORAL | Status: AC
  Administered 2017-10-30: 50 mg via ORAL
  Filled 2017-10-30: qty 1

## 2017-10-30 MED ORDER — TRAMADOL HCL 50 MG PO TABS
ORAL_TABLET | ORAL | Status: AC
  Filled 2017-10-30: qty 1

## 2017-10-30 MED ORDER — METOPROLOL TARTRATE 50 MG PO TABS
50.0000 mg | ORAL_TABLET | Freq: Once | ORAL | Status: AC
  Administered 2017-10-30: 50 mg via ORAL

## 2017-10-30 MED ORDER — METOPROLOL TARTRATE 50 MG PO TABS
ORAL_TABLET | ORAL | Status: AC
  Administered 2017-10-30: 50 mg via ORAL
  Filled 2017-10-30: qty 1

## 2017-10-30 MED ORDER — ACETAMINOPHEN 500 MG PO TABS
ORAL_TABLET | ORAL | Status: AC
Start: 2017-10-30 — End: 2017-10-30
  Administered 2017-10-30: 1000 mg via ORAL
  Filled 2017-10-30: qty 2

## 2017-10-30 MED ORDER — HYDRALAZINE HCL 50 MG PO TABS
50.0000 mg | ORAL_TABLET | Freq: Once | ORAL | Status: AC
  Administered 2017-10-30: 50 mg via ORAL

## 2017-10-30 MED ORDER — ACETAMINOPHEN 500 MG PO TABS
1000.0000 mg | ORAL_TABLET | Freq: Once | ORAL | Status: AC
  Administered 2017-10-30: 1000 mg via ORAL

## 2017-10-30 MED ORDER — TRAMADOL HCL 50 MG PO TABS: 50.0000 mg | ORAL_TABLET | Freq: Once | ORAL | Status: DC

## 2017-10-30 NOTE — ED Notes (Signed)
Report given to Kate RN

## 2017-10-30 NOTE — ED Notes (Signed)
Date and time results received: 10/30/17 2333 (use smartphrase ".now" to insert current time)  Test: Troponin Critical Value: 0.03  Name of Provider Notified: Dr. Alfred Levins  Orders Received? Or Actions Taken?:

## 2017-10-30 NOTE — ED Triage Notes (Signed)
Was leaning down to turn her light off an fell.onto knees.  Says rightr worse than left.  fam says she is not able to walk as she usually does after fall.

## 2017-10-30 NOTE — ED Provider Notes (Signed)
[facility] Emergency Department Provider Note       Time seen: ----------------------------------------- 10:18 PM on 10/30/2017 -----------------------------------------   I have reviewed the triage vital signs and the nursing notes.  HISTORY   Chief Complaint Fall    HPI Tara [name] is a 84 y.o. female with a history of asthma, diabetes who presents to the ED for a fall.  Patient was leaning down to turn her light off and she fell to her knees.  She is complaining of right knee pain worse than the left.  Family states she is not able to walk as she usually does because of the fall.  She has not had any fevers, chills, chest pain, shortness of breath, vomiting or diarrhea.  Past Medical History:  Diagnosis Date  . Asthma   . Diabetes mellitus ([place])   . Environmental allergies   . Hypertension     Patient Active Problem List   Diagnosis Date Noted  . Bilateral lower extremity edema 09/07/2017  . Right hand pain 08/25/2017  . Leg pain 08/25/2017  . Right groin hernia 07/03/2017  . Abnormal CT of the abdomen 07/03/2017  . Allergic rhinitis 06/17/2017  . Osteoporosis 03/12/2017  . Nasal obstruction 09/03/2016  . Loss of weight 03/05/2015  . Unsteady gait 07/31/2013  . Lower extremity edema 05/15/2013  . Peripheral vascular disease ([place]) 08/29/2012  . Hypertension 06/11/2012  . Hypercholesteremia 06/11/2012  . Diabetes mellitus with peripheral vascular disease ([place]) 06/11/2012  . Anemia 06/11/2012  . B12 deficiency 06/11/2012  . Hypothyroidism 06/11/2012    Past Surgical History:  Procedure Laterality Date  . APPENDECTOMY    . TUBAL LIGATION      Allergies Ace inhibitors; Hydrochlorothiazide w-triamterene; Lisinopril; Penicillins; and Trichlormethiazide  Social History Social History   Tobacco Use  . Smoking status: Never Smoker  . Smokeless tobacco: Never Used  Substance Use Topics  . Alcohol use: No    Alcohol/week: 0.0 oz   . Drug use: No   Review of Systems Constitutional: Negative for fever. Cardiovascular: Negative for chest pain. Respiratory: Negative for shortness of breath. Gastrointestinal: Negative for abdominal pain, vomiting and diarrhea. Musculoskeletal: Positive for bilateral knee pain Skin: Negative for rash. Neurological: Negative for headaches, focal weakness or numbness.  All systems negative/normal/unremarkable except as stated in the HPI  ____________________________________________   PHYSICAL EXAM:  VITAL SIGNS: ED Triage Vitals  Enc Vitals Group     BP 10/30/17 1835 (!) 217/78     Pulse Rate 10/30/17 1835 76     Resp 10/30/17 1835 16     Temp 10/30/17 1835 98.3 F (36.8 C)     Temp Source 10/30/17 1835 Oral     SpO2 10/30/17 1835 99 %     Weight 10/30/17 1837 90 lb (40.8 kg)     Height --      Head Circumference --      Peak Flow --      Pain Score 10/30/17 1835 0     Pain Loc --      Pain Edu? --      Excl. in [place]? --    Constitutional: Alert and oriented. Well appearing and in no distress. Eyes: Conjunctivae are normal. Normal extraocular movements. ENT   Head: Normocephalic and atraumatic.   Nose: No congestion/rhinnorhea.   Mouth/Throat: Mucous membranes are moist.   Neck: No stridor. Cardiovascular: Normal rate, regular rhythm. No murmurs, rubs, or gallops. Respiratory: Normal respiratory effort without tachypnea nor retractions. Breath  sounds are clear and equal bilaterally. No wheezes/rales/rhonchi. Gastrointestinal: Soft and nontender. Normal bowel sounds Musculoskeletal: Tenderness over both knees with limited range of motion, slight right knee effusion is palpated.  Arthritis is appreciated in both knees Neurologic:  Normal speech and language. No gross focal neurologic deficits are appreciated.  Skin:  Skin is warm, dry and intact.  Small abrasion noted over the left ankle medially Psychiatric: Mood and affect are normal. Speech and behavior  are normal.  ____________________________________________  EKG: Interpreted by me.  Sinus rhythm with sinus arrhythmia, rate of 69 bpm, normal PR interval, LVH, normal QT  ____________________________________________  ED COURSE:  As part of my medical decision making, I reviewed the following data within the [place] History obtained from family if available, nursing notes, old chart and ekg, as well as notes from prior ED visits. Patient presented for knee pain, we will assess with labs and imaging as indicated at this time. During the time of my evaluation, patient began complaining of chest pain.  Will initiate a cardiac workup.   Procedures ____________________________________________   LABS (pertinent positives/negatives)  Labs Reviewed  BASIC METABOLIC PANEL  CBC  TROPONIN I    RADIOLOGY Images were viewed by me  Chest x-ray Is unremarkable ____________________________________________  DIFFERENTIAL DIAGNOSIS   Contusion, fracture, unstable angina, GERD, MI  FINAL ASSESSMENT AND PLAN  Fall, knee pain, chest pain   Plan: The patient had presented for knee pain after a fall.  While in the ER and as we were about to discharge her for her knee pain she began having sudden chest pain.  She was noted to be hypertensive and just prior to this we had given her a dose of her nightly blood pressure medication.  Patient's labs and disposition are pending at this time.   Laurence Aly, MD   Note: This note was generated in part or whole with voice recognition software. Voice recognition is usually quite accurate but there are transcription errors that can and very often do occur. I apologize for any typographical errors that were not detected and corrected.     Earleen Newport, MD 10/30/17 [phone]

## 2017-10-31 ENCOUNTER — Emergency Department: Payer: Medicare Other

## 2017-10-31 ENCOUNTER — Other Ambulatory Visit: Payer: Self-pay

## 2017-10-31 DIAGNOSIS — E119 Type 2 diabetes mellitus without complications: Secondary | ICD-10-CM | POA: Diagnosis not present

## 2017-10-31 DIAGNOSIS — I160 Hypertensive urgency: Secondary | ICD-10-CM | POA: Diagnosis present

## 2017-10-31 DIAGNOSIS — E039 Hypothyroidism, unspecified: Secondary | ICD-10-CM | POA: Diagnosis not present

## 2017-10-31 DIAGNOSIS — S32599A Other specified fracture of unspecified pubis, initial encounter for closed fracture: Secondary | ICD-10-CM | POA: Diagnosis not present

## 2017-10-31 DIAGNOSIS — M25551 Pain in right hip: Secondary | ICD-10-CM | POA: Diagnosis not present

## 2017-10-31 DIAGNOSIS — M25552 Pain in left hip: Secondary | ICD-10-CM | POA: Diagnosis not present

## 2017-10-31 DIAGNOSIS — S79911A Unspecified injury of right hip, initial encounter: Secondary | ICD-10-CM | POA: Diagnosis not present

## 2017-10-31 DIAGNOSIS — S79912A Unspecified injury of left hip, initial encounter: Secondary | ICD-10-CM | POA: Diagnosis not present

## 2017-10-31 LAB — GLUCOSE, CAPILLARY
Glucose-Capillary: 253 mg/dL — ABNORMAL HIGH (ref 65–99)
Glucose-Capillary: 89 mg/dL (ref 65–99)
Glucose-Capillary: 186 mg/dL — ABNORMAL HIGH (ref 65–99)

## 2017-10-31 LAB — TROPONIN I: TROPONIN I: 0.03 ng/mL — AB (ref <0.03)

## 2017-10-31 LAB — TSH: TSH: 1.765 u[IU]/mL (ref 0.350–4.500)

## 2017-10-31 MED ORDER — ACETAMINOPHEN 325 MG PO TABS
650.0000 mg | ORAL_TABLET | Freq: Four times a day (QID) | ORAL | Status: DC | PRN
  Administered 2017-11-01: 650 mg via ORAL
  Filled 2017-10-31: qty 2

## 2017-10-31 MED ORDER — HYDRALAZINE HCL 20 MG/ML IJ SOLN
10.0000 mg | Freq: Once | INTRAMUSCULAR | Status: AC
  Administered 2017-10-31: 10 mg via INTRAVENOUS
  Filled 2017-10-31: qty 1

## 2017-10-31 MED ORDER — POTASSIUM CHLORIDE CRYS ER 10 MEQ PO TBCR
10.0000 meq | EXTENDED_RELEASE_TABLET | Freq: Every day | ORAL | Status: DC
  Administered 2017-10-31 – 2017-11-02 (×3): 10 meq via ORAL
  Filled 2017-10-31 (×6): qty 1

## 2017-10-31 MED ORDER — MORPHINE SULFATE (PF) 2 MG/ML IV SOLN
0.5000 mg | Freq: Once | INTRAVENOUS | Status: AC
  Administered 2017-10-31: 0.5 mg via INTRAVENOUS
  Filled 2017-10-31: qty 1

## 2017-10-31 MED ORDER — ONDANSETRON HCL 4 MG PO TABS
4.0000 mg | ORAL_TABLET | Freq: Four times a day (QID) | ORAL | Status: DC | PRN
Start: 2017-10-31 — End: 2017-11-02

## 2017-10-31 MED ORDER — AMLODIPINE BESYLATE 10 MG PO TABS
10.0000 mg | ORAL_TABLET | Freq: Every day | ORAL | Status: DC
  Administered 2017-11-01 – 2017-11-02 (×2): 10 mg via ORAL
  Filled 2017-10-31 (×2): qty 1

## 2017-10-31 MED ORDER — AMLODIPINE BESYLATE 5 MG PO TABS
5.0000 mg | ORAL_TABLET | Freq: Once | ORAL | Status: AC
  Administered 2017-10-31: 5 mg via ORAL
  Filled 2017-10-31: qty 1

## 2017-10-31 MED ORDER — VITAMIN D 1000 UNITS PO TABS
2000.0000 [IU] | ORAL_TABLET | Freq: Every day | ORAL | Status: DC
  Administered 2017-10-31 – 2017-11-02 (×3): 2000 [IU] via ORAL
  Filled 2017-10-31 (×3): qty 2

## 2017-10-31 MED ORDER — ASPIRIN EC 81 MG PO TBEC
81.0000 mg | DELAYED_RELEASE_TABLET | Freq: Every day | ORAL | Status: DC
  Administered 2017-10-31 – 2017-11-02 (×3): 81 mg via ORAL
  Filled 2017-10-31 (×3): qty 1

## 2017-10-31 MED ORDER — HYDRALAZINE HCL 20 MG/ML IJ SOLN
10.0000 mg | Freq: Four times a day (QID) | INTRAMUSCULAR | Status: DC | PRN
  Administered 2017-10-31: 10 mg via INTRAVENOUS
  Filled 2017-10-31: qty 1

## 2017-10-31 MED ORDER — HYDRALAZINE HCL 50 MG PO TABS
ORAL_TABLET | ORAL | Status: AC
  Filled 2017-10-31: qty 1

## 2017-10-31 MED ORDER — ENOXAPARIN SODIUM 30 MG/0.3ML ~~LOC~~ SOLN
30.0000 mg | SUBCUTANEOUS | Status: DC
  Administered 2017-10-31 – 2017-11-02 (×3): 30 mg via SUBCUTANEOUS
  Filled 2017-10-31 (×4): qty 0.3

## 2017-10-31 MED ORDER — LEVOTHYROXINE SODIUM 50 MCG PO TABS
50.0000 ug | ORAL_TABLET | Freq: Every day | ORAL | Status: DC
  Administered 2017-10-31 – 2017-11-02 (×3): 50 ug via ORAL
  Filled 2017-10-31 (×3): qty 1

## 2017-10-31 MED ORDER — LORATADINE 10 MG PO TABS
10.0000 mg | ORAL_TABLET | Freq: Every day | ORAL | Status: DC
  Administered 2017-10-31 – 2017-11-02 (×3): 10 mg via ORAL
  Filled 2017-10-31 (×3): qty 1

## 2017-10-31 MED ORDER — INSULIN ASPART 100 UNIT/ML ~~LOC~~ SOLN: 0.0000 [IU] | Freq: Every day | SUBCUTANEOUS | Status: DC

## 2017-10-31 MED ORDER — INSULIN ASPART 100 UNIT/ML ~~LOC~~ SOLN
0.0000 [IU] | Freq: Three times a day (TID) | SUBCUTANEOUS | Status: DC
  Administered 2017-10-31: 5 [IU] via SUBCUTANEOUS
  Administered 2017-11-01: 2 [IU] via SUBCUTANEOUS
  Administered 2017-11-01 (×2): 1 [IU] via SUBCUTANEOUS
  Administered 2017-11-02 (×2): 2 [IU] via SUBCUTANEOUS
  Filled 2017-10-31 (×6): qty 1

## 2017-10-31 MED ORDER — ACETAMINOPHEN 650 MG RE SUPP: 650.0000 mg | Freq: Four times a day (QID) | RECTAL | Status: DC | PRN

## 2017-10-31 MED ORDER — ONDANSETRON HCL 4 MG/2ML IJ SOLN: 4.0000 mg | Freq: Four times a day (QID) | INTRAMUSCULAR | Status: DC | PRN

## 2017-10-31 MED ORDER — DOCUSATE SODIUM 100 MG PO CAPS
100.0000 mg | ORAL_CAPSULE | Freq: Two times a day (BID) | ORAL | Status: DC
  Administered 2017-10-31 – 2017-11-02 (×5): 100 mg via ORAL
  Filled 2017-10-31 (×5): qty 1

## 2017-10-31 MED ORDER — HYDRALAZINE HCL 50 MG PO TABS
50.0000 mg | ORAL_TABLET | Freq: Three times a day (TID) | ORAL | Status: DC
Start: 2017-10-31 — End: 2017-11-02
  Administered 2017-10-31 – 2017-11-02 (×8): 50 mg via ORAL
  Filled 2017-10-31 (×8): qty 1

## 2017-10-31 MED ORDER — CALCIUM CARBONATE-VITAMIN D 500-200 MG-UNIT PO TABS
1.0000 | ORAL_TABLET | Freq: Every day | ORAL | Status: DC
  Administered 2017-10-31 – 2017-11-02 (×3): 1 via ORAL
  Filled 2017-10-31 (×4): qty 1

## 2017-10-31 MED ORDER — HYDRALAZINE HCL 20 MG/ML IJ SOLN
5.0000 mg | Freq: Once | INTRAMUSCULAR | Status: AC
  Administered 2017-10-31: 5 mg via INTRAVENOUS
  Filled 2017-10-31: qty 1

## 2017-10-31 MED ORDER — NITROGLYCERIN 2 % TD OINT
0.5000 [in_us] | TOPICAL_OINTMENT | TRANSDERMAL | Status: AC
  Administered 2017-10-31: 0.5 [in_us] via TOPICAL
  Filled 2017-10-31: qty 1

## 2017-10-31 MED ORDER — AMLODIPINE BESYLATE 5 MG PO TABS
5.0000 mg | ORAL_TABLET | Freq: Every day | ORAL | Status: DC
  Administered 2017-10-31: 5 mg via ORAL
  Filled 2017-10-31: qty 1

## 2017-10-31 MED ORDER — METOPROLOL SUCCINATE ER 25 MG PO TB24
25.0000 mg | ORAL_TABLET | Freq: Two times a day (BID) | ORAL | Status: DC
Start: 2017-10-31 — End: 2017-11-01
  Administered 2017-10-31 – 2017-11-01 (×3): 25 mg via ORAL
  Filled 2017-10-31 (×3): qty 1

## 2017-10-31 NOTE — NC FL2 (Signed)
[place] LEVEL OF CARE SCREENING TOOL     IDENTIFICATION  Patient Name: Tara [name] Birthdate: Jan 05, 1918 Sex: female Admission Date (Current Location): 10/30/2017  [place] and [place] Number:  Engineering geologist and Address:  [facility], [address], [place], [place] [address]      Provider Number: [id number]  Attending Physician Name and Address:  Harrie Foreman, MD  Relative Name and Phone Number:   Jayme Cloud [phone]    Current Level of Care: Hospital Recommended Level of Care: [place] Prior Approval Number:    Date Approved/Denied:   PASRR Number:   [id number]   Discharge Plan: SNF    Current Diagnoses: Patient Active Problem List   Diagnosis Date Noted  . Hypertensive urgency 10/31/2017  . Bilateral lower extremity edema 09/07/2017  . Right hand pain 08/25/2017  . Leg pain 08/25/2017  . Right groin hernia 07/03/2017  . Abnormal CT of the abdomen 07/03/2017  . Allergic rhinitis 06/17/2017  . Osteoporosis 03/12/2017  . Nasal obstruction 09/03/2016  . Loss of weight 03/05/2015  . Unsteady gait 07/31/2013  . Lower extremity edema 05/15/2013  . Peripheral vascular disease ([place]) 08/29/2012  . Hypertension 06/11/2012  . Hypercholesteremia 06/11/2012  . Diabetes mellitus with peripheral vascular disease ([place]) 06/11/2012  . Anemia 06/11/2012  . B12 deficiency 06/11/2012  . Hypothyroidism 06/11/2012    Orientation RESPIRATION BLADDER Height & Weight     Self, Time, Situation, Place  Normal Continent Weight: 90 lb (40.8 kg) Height:     BEHAVIORAL SYMPTOMS/MOOD NEUROLOGICAL BOWEL NUTRITION STATUS      Continent Diet(Diabetic)  AMBULATORY STATUS COMMUNICATION OF NEEDS Skin   Limited Assist Verbally Normal                       Personal Care Assistance Level of Assistance  Bathing, Feeding, Dressing, Total care Bathing Assistance: Limited assistance Feeding assistance:  Independent Dressing Assistance: Limited assistance Total Care Assistance: Limited assistance   Functional Limitations Info  Sight, Hearing, Speech Sight Info: Adequate Hearing Info: Adequate Speech Info: Adequate    SPECIAL CARE FACTORS FREQUENCY  PT (By licensed PT), OT (By licensed OT)     PT Frequency: x5 OT Frequency: x5            Contractures Contractures Info: Not present    Additional Factors Info  Code Status, Allergies Code Status Info: full code             Current Medications (10/31/2017):  This is the current hospital active medication list Current Facility-Administered Medications  Medication Dose Route Frequency Provider Last Rate Last Dose  . amLODipine (NORVASC) tablet 5 mg  5 mg Oral Daily Harrie Foreman, MD      . hydrALAZINE (APRESOLINE) tablet 50 mg  50 mg Oral Q8H Harrie Foreman, MD   50 mg at 10/31/17 0753  . metoprolol succinate (TOPROL-XL) 24 hr tablet 25 mg  25 mg Oral BID Harrie Foreman, MD       Current Outpatient Medications  Medication Sig Dispense Refill  . acetaminophen (TYLENOL) 325 MG tablet Take 650 mg by mouth every 6 (six) hours as needed for mild pain.     Marland Kitchen amLODipine (NORVASC) 5 MG tablet Take 1 tablet (5 mg total) by mouth daily. 30 tablet 0  . aspirin 81 MG tablet Take 81 mg by mouth daily.    . Calcium Carbonate-Vitamin D (CALTRATE 600+D PO) Take 1  tablet by mouth daily.     . Cholecalciferol (D3 SUPER STRENGTH) 2000 UNITS CAPS Take 2,000 Units by mouth daily.     . fexofenadine (ALLEGRA) 180 MG tablet Take 1 tablet (180 mg total) by mouth daily. 30 tablet 2  . glucose blood (ACCU-CHEK AVIVA PLUS) test strip CHECK SUGAR ONCE DAILY 100 each 6  . hydrALAZINE (APRESOLINE) 50 MG tablet TAKE ONE (1) TABLET THREE (3) TIMES EACH DAY 90 tablet 1  . metoprolol succinate (TOPROL-XL) 25 MG 24 hr tablet Take 1 tablet (25 mg total) by mouth 2 (two) times daily. 60 tablet 3  . potassium chloride (K-DUR) 10 MEQ tablet Take 1  tablet (10 mEq total) by mouth daily. 30 tablet 2  . SYNTHROID 50 MCG tablet TAKE ONE (1) TABLET EACH DAY 90 tablet 1  . fluticasone (FLONASE) 50 MCG/ACT nasal spray Place 2 sprays daily into both nostrils. (Patient not taking: Reported on 10/31/2017) 16 g 6  . furosemide (LASIX) 20 MG tablet Take 1 tablet (20 mg total) by mouth daily. (Patient not taking: Reported on 10/31/2017) 30 tablet 1     Discharge Medications: Please see discharge summary for a list of discharge medications.  Relevant Imaging Results:  Relevant Lab Results:   Additional Information [address] [place], [place]

## 2017-10-31 NOTE — Clinical Social Work Note (Signed)
CSW is following pending PT evaluation. The patient was assessed by the ED CSW in the ED. The patient is observation status; however, she has [place], which may approve STR with prior authorization if recommended by PT. CSW will update once PT has seen the patient.  Santiago Bumpers, MSW, Latanya Presser [phone]

## 2017-10-31 NOTE — Clinical Social Work Note (Signed)
Clinical Social Work Assessment  Patient Details  Name: Tara [name] MRN: [id number] Date of Birth: 1918/04/27  Date of referral:  10/31/17               Reason for consult:  Facility Placement                Permission sought to share information with:  Family Supports, Customer service manager Permission granted to share information::  Yes, Verbal Permission Granted  Name::      Tara [name]  daughter [phone] other daughter Tara [name] [phone]  Agency::  All facilities  Relationship::  yes  Contact Information:  yes  Housing/Transportation Living arrangements for the past 2 months:  [place] of Information:  Patient, Medical Team, Adult Children Patient Interpreter Needed:  None Criminal Activity/Legal Involvement Pertinent to Current Situation/Hospitalization:  No - Comment as needed Significant Relationships:  Adult Children, [place], Other Family Members Lives with:  Self Do you feel safe going back to the place where you live?  Yes Need for family participation in patient care:  Yes (Comment)  Care giving concerns: Family wishes her to placed in [place] Worker assessment / plan: LCSW introduced myself to patient and her daughters. Patient is a 84 year old female who has remained living on her own and has excellent family support. Patient is oriented x4 and walks with walker and is independent with her ADL's but required support with bathing.  Patient has come in to facility for hip fracture and inability to walk and for pain management.  Her insurance Sales promotion account executive. Family was informed that she will be admitted and they wish for her to be placed in SNF for STR. PT consult is pending. LCSW explained I would complete assessment Fl2 and Passr and send out her information to different facilities for dc planning. No further needs at this time.  Employment status:  Retired(Was a Secretary/administrator) Forensic scientist:  Medicare([employer]) PT Recommendations:  Inpatient Rehab Consult Information / Referral to community resources:  [place]  Patient/Family's Response to care: Patient has a good understanding  Patient/Family's Understanding of and Emotional Response to Diagnosis, Current Treatment, and Prognosis: Good understanding  Emotional Assessment Appearance:  Appears younger than stated age Attitude/Demeanor/Rapport:  Gracious Affect (typically observed):  Accepting, Calm Orientation:    Alcohol / Substance use:  Not Applicable Psych involvement (Current and /or in the community):  No (Comment)  Discharge Needs  Concerns to be addressed:  Care Coordination Readmission within the last 30 days:  No Current discharge risk:  None Barriers to Discharge:  Continued Medical Work up   Joana Reamer, LCSW 10/31/2017, 8:13 AM

## 2017-10-31 NOTE — H&P (Signed)
Tara [name] is an 84 y.o. female.   Chief Complaint: Fall HPI: Patient with past medical history of diabetes and hypertension presents to the emergency department after a mechanical fall.  The patient did not lose consciousness nor did she hit her head.  Her primary complaint was knee pain upon arrival.  X-rays of her knee are unrevealing however the patient again to have chest pain.  2 cycles of cardiac enzymes were negative and the patient had no signs or symptoms of ischemia.  However her blood pressure was dramatically elevated throughout this time.  As the patient had not yet taken her evening medications the treatment team felt as if she may be ready for discharge.  However as she was preparing to leave it was discovered the patient could not walk.  This prompted further imaging of her hips which showed pubic ramus fracture which prompted the emergency department staff to call the hospitalist service for admission.  Past Medical History:  Diagnosis Date  . Asthma   . Diabetes mellitus ([place])   . Environmental allergies   . Hypertension     Past Surgical History:  Procedure Laterality Date  . APPENDECTOMY    . TUBAL LIGATION      Family History  Problem Relation Age of Onset  . Hypertension Mother   . Cancer Brother        lung (question)  . Cancer Sister        questionable type   Social History:  reports that she has never smoked. She has never used smokeless tobacco. She reports that she does not drink alcohol or use drugs.  Allergies:  Allergies  Allergen Reactions  . Ace Inhibitors Other (See Comments)    Angioedema  . Hydrochlorothiazide W-Triamterene Other (See Comments)    Gout flare  . Lisinopril Swelling and Other (See Comments)  . Penicillins Swelling  . Trichlormethiazide Other (See Comments)    Gout flare     (Not in a hospital admission)  Results for orders placed or performed during the hospital encounter of 10/30/17 (from the past 48 hour(s))  Basic  metabolic panel     Status: Abnormal   Collection Time: 10/30/17 11:07 PM  Result Value Ref Range   Sodium 140 135 - 145 mmol/L   Potassium 4.2 3.5 - 5.1 mmol/L   Chloride 108 101 - 111 mmol/L   CO2 24 22 - 32 mmol/L   Glucose, Bld 192 (H) 65 - 99 mg/dL   BUN 32 (H) 6 - 20 mg/dL   Creatinine, Ser 0.83 0.44 - 1.00 mg/dL   Calcium 9.1 8.9 - 10.3 mg/dL   GFR calc non Af Amer 57 (L) >60 mL/min   GFR calc Af Amer >60 >60 mL/min    Comment: (NOTE) The eGFR has been calculated using the CKD EPI equation. This calculation has not been validated in all clinical situations. eGFR's persistently <60 mL/min signify possible Chronic Kidney Disease.    Anion gap 8 5 - 15    Comment: Performed at [facility], [address] [place], [place] [address]  CBC     Status: Abnormal   Collection Time: 10/30/17 11:07 PM  Result Value Ref Range   WBC 10.0 3.6 - 11.0 K/uL   RBC 3.83 3.80 - 5.20 MIL/uL   Hemoglobin 12.1 12.0 - 16.0 g/dL   HCT 35.3 35.0 - 47.0 %   MCV 92.1 80.0 - 100.0 fL   MCH 31.6 26.0 - 34.0 pg  MCHC 34.3 32.0 - 36.0 g/dL   RDW 15.7 (H) 11.5 - 14.5 %   Platelets 226 150 - 440 K/uL    Comment: Performed at [facility], [address] [place], [place] [address]  Troponin I     Status: Abnormal   Collection Time: 10/30/17 11:07 PM  Result Value Ref Range   Troponin I 0.03 (HH) <0.03 ng/mL    Comment: CRITICAL RESULT CALLED TO, READ BACK BY AND VERIFIED WITH KALA KEEN AT 2332 10/30/17.PMH Performed at [facility], [address] [place], [place] [address]   Troponin I     Status: Abnormal   Collection Time: 10/31/17  2:42 AM  Result Value Ref Range   Troponin I 0.03 (HH) <0.03 ng/mL    Comment: CRITICAL VALUE NOTED. VALUE IS CONSISTENT WITH PREVIOUSLY REPORTED/CALLED VALUE.PMH Performed at [facility], [address] [place], [place] [address]    Dg Chest Port 1 View  Result Date: 10/30/2017 CLINICAL DATA:  Chest pain. EXAM:  PORTABLE CHEST 1 VIEW COMPARISON:  07/06/2014 FINDINGS: Unchanged cardiomegaly with tortuous atherosclerotic thoracic aorta. No pulmonary edema. No consolidation, pleural effusion or pneumothorax. Degenerative change in both shoulders. IMPRESSION: Chronic cardiomegaly and tortuous atherosclerotic thoracic aorta. No acute abnormality. Electronically Signed   By: Jeb Levering M.D.   On: 10/30/2017 23:11   Dg Knee Complete 4 Views Left  Result Date: 10/30/2017 CLINICAL DATA:  Fall with knee pain EXAM: LEFT KNEE - COMPLETE 4+ VIEW COMPARISON:  12/29/2013 FINDINGS: No acute displaced fracture or malalignment. Mild patellofemoral and medial joint space degenerative change. Trace knee effusion. Vascular calcification. IMPRESSION: Mild degenerative changes. Trace knee effusion. No acute fracture or malalignment Electronically Signed   By: Donavan Foil M.D.   On: 10/30/2017 20:05   Dg Knee Complete 4 Views Right  Result Date: 10/30/2017 CLINICAL DATA:  Fall with knee pain EXAM: RIGHT KNEE - COMPLETE 4+ VIEW COMPARISON:  None. FINDINGS: No acute displaced fracture or malalignment. Mild patellofemoral and medial joint space degenerative change. Vascular calcification. No large knee effusion. IMPRESSION: No acute osseous abnormality Electronically Signed   By: Donavan Foil M.D.   On: 10/30/2017 20:06   Dg Hips Bilat W Or Wo Pelvis 3-4 Views  Result Date: 10/31/2017 CLINICAL DATA:  Pain after fall. Bilateral hip pain. Cannot support weight. Right worse than left. EXAM: DG HIP (WITH OR WITHOUT PELVIS) 3-4V BILAT COMPARISON:  Reformats from abdominal CT 06/26/2017 FINDINGS: Right inferior pubic ramus fracture is remote in unchanged from prior CT. No evidence of acute fracture. Femoral head remains seated. Fracture of the left superior pubic ramus is new from prior exam, minimally displaced. Probable nondisplaced left inferior pubic ramus fracture. Femoral head remains seated. Pubic symphysis and sacroiliac joints  remain congruent. Advanced vascular calcifications. IMPRESSION: 1. Acute left superior pubic ramus fracture, minimally displaced, suspect nondisplaced left inferior ramus fracture. 2. Remote right inferior pubic ramus fracture. No acute fracture of the right hip. Electronically Signed   By: Jeb Levering M.D.   On: 10/31/2017 05:03    Review of Systems  Constitutional: Negative for chills and fever.  HENT: Negative for sore throat and tinnitus.   Eyes: Negative for blurred vision and redness.  Respiratory: Negative for cough and shortness of breath.   Cardiovascular: Positive for chest pain. Negative for palpitations, orthopnea and PND.  Gastrointestinal: Negative for abdominal pain, diarrhea, nausea and vomiting.  Genitourinary: Negative for dysuria, frequency and urgency.  Musculoskeletal: Positive for falls. Negative for joint  pain and myalgias.  Skin: Negative for rash.       No lesions  Neurological: Negative for speech change, focal weakness and weakness.  Endo/Heme/Allergies: Does not bruise/bleed easily.       No temperature intolerance  Psychiatric/Behavioral: Negative for depression and suicidal ideas.    Blood pressure (!) 198/75, pulse 65, temperature 98.3 F (36.8 C), temperature source Oral, resp. rate 18, weight 40.8 kg (90 lb), SpO2 97 %. Physical Exam  Vitals reviewed. Constitutional: She is oriented to person, place, and time. She appears well-developed and well-nourished. No distress.  HENT:  Head: Normocephalic and atraumatic.  Mouth/Throat: Oropharynx is clear and moist.  Eyes: Pupils are equal, round, and reactive to light. Conjunctivae and EOM are normal.  Neck: Normal range of motion. Neck supple. No JVD present. No tracheal deviation present. No thyromegaly present.  Cardiovascular: Normal rate, regular rhythm and normal heart sounds. Exam reveals no gallop and no friction rub.  No murmur heard. Respiratory: Effort normal and breath sounds normal.  GI:  Soft. Bowel sounds are normal. She exhibits no distension. There is no tenderness.  Genitourinary:  Genitourinary Comments: Deferred  Musculoskeletal: Normal range of motion. She exhibits no edema.  Lymphadenopathy:    She has no cervical adenopathy.  Neurological: She is alert and oriented to person, place, and time. No cranial nerve deficit. She exhibits normal muscle tone.  Skin: Skin is warm and dry. No rash noted. No erythema.  Psychiatric: She has a normal mood and affect. Her behavior is normal. Judgment and thought content normal.     Assessment/Plan This is a 84 year old female admitted for hip fracture. 1.  Hypertensive urgency: I have applied Nitropaste to the patient's chest.  Restart home medications.  Hydralazine as needed 2.  Hip fracture: Pubic ramus; the patient will need orthopedic evaluation as well as PT/OT evaluation prior to discharge.  The patient will not be able to bear weight for some time. 3.  Diabetes mellitus type 2: Hold oral hypoglycemic agents.  Sliding cell insulin while hospitalized. 4.  Both thyroidism: Check TSH; continue Synthroid 5.  DVT prophylaxis: SCDs 6.  GI prophylaxis: None The patient is a full code.  Time spent on admission orders and patient care approximately 45 minutes  Harrie Foreman, MD 10/31/2017, 8:13 AM

## 2017-10-31 NOTE — Care Management (Addendum)
Met with patient to discuss MOON. She is from home alone. She has two daughters that help her with transportation.  She ambulates with a front-wheeled walker. She has a rollator and a cane too.   Her PCP is Dr. Nicki Reaper. [facility].  I spoke with patient's daughter Kendrick Fries by phone [phone] to update on MOON also. Patient is not currently receiving home health services.

## 2017-10-31 NOTE — Progress Notes (Signed)
Anticoagulation monitoring(Lovenox):  84yo  female ordered Lovenox 40 mg Q24h  Filed Weights   10/30/17 1837  Weight: 90 lb (40.8 kg)   BMI 18.9   Lab Results  Component Value Date   CREATININE 0.83 10/30/2017   CREATININE 0.94 09/30/2017   CREATININE 0.95 09/09/2017   Estimated Creatinine Clearance: 25 mL/min (by C-G formula based on SCr of 0.83 mg/dL). Hemoglobin & Hematocrit     Component Value Date/Time   HGB 12.1 10/30/2017 2307   HGB 11.6 (L) 07/06/2014 0736   HCT 35.3 10/30/2017 2307   HCT 35.7 07/06/2014 0736     Per Protocol for Patient with estCrcl < 30 ml/min and BMI < 40, will transition to Lovenox 30 mg Q24h.

## 2017-10-31 NOTE — Progress Notes (Signed)
Advanced Care Plan.  Purpose of Encounter: CODE STATUS. Parties in Attendance: The patient, HER-2 daughters and me. Patient's Decisional Capacity: Yes. Medical Story: This is a 84-year-old female admitted for pelvic fracture due to fall.  She also has hypertension urgency and diabetes.  Discussed with the patient and her daughters about CODE STATUS.  The patient cannot decide now.  According to her daughters, the patient want full code for now.  But she does not want to be on ventilation long-term.  Plan:  Code Status: Full code for now. Time spent discussing advance care planning: 17 minutes.   

## 2017-10-31 NOTE — Care Management Obs Status (Signed)
[place] NOTIFICATION   Patient Details  Name: CAITLAND PORCHIA MRN: [id number] Date of Birth: 1917-11-26   Medicare Observation Status Notification Given:  Yes    Marshell Garfinkel, RN 10/31/2017, 9:26 AM

## 2017-10-31 NOTE — ED Provider Notes (Signed)
----------------------------------------- 11:00 PM on 10/30/2017 -----------------------------------------  Assuming care from Dr. Jimmye Norman.  In short, Tara [name] is a 84 y.o. female with a chief complaint of fall and acute onset chest pain at the time of discharge.  Refer to the original H&P for additional details.  The current plan of care is to follow-up on labs and reassess.   ----------------------------------------- 12:34 AM on 10/31/2017 -----------------------------------------  I reassessed the patient and she has 4 family members currently with her in the room.  She says she can feel where the pain was, but she no longer feels active chest pain.  Her troponin was 0.03.  I suspect this is related to her age and possibly her high blood pressure but likely does not represent new onset ACS.  However I explained to the patient and family the need to repeat a second troponin at the 3-hour mark.  They understand and agree with the plan.  I do not think that this patient would benefit from admission to the hospital and would likely just put her at risk for nosocomial infection.     ----------------------------------------- 4:11 AM on 10/31/2017 -----------------------------------------  The patient's second troponin was stable from prior at 0.03.  The patient has been asymptomatic.  I went into discussed with her and her for family members the results and was ready to discharge them when they informed me that she is not able to walk or bear any weight.  It was a little bit difficult to determine exactly how long the symptoms have been going on, but apparently she has been having trouble with her knees which possibly led up to the fall for which she was previously evaluated today.  She reports that she cannot stand up by herself after the fall.  I reviewed the radiographs which indicate no indication of any fracture or dislocation to either 1 of her knees.  I ranged her legs and she does  have some tenderness or pain with range of motion of her hip and legs and I have ordered radiographs of the hips.  Ultimately the family does not feel comfortable with her going home as she lives independently and they feel they cannot help her sufficiently for her to care for herself.  I explained that there is no indication for admission but that we would need to order physical therapy and occupational therapy consults as well as social work and see if there is a recommendation for rehabilitation or skilled nursing facility placement.  That is how they want to proceed.  I have put in the orders as well as the radiographs of her hips to make sure that there is no evidence of any fracture more proximal than was previously investigated.   ----------------------------------------- 5:45 AM on 10/31/2017 -----------------------------------------  Patient's radiographs were notable for acute pubic ramus fracture, likely both superior and inferior.  This accounts for his her inability to bear weight and her intractable pain with any amount of movement.  I am going to treat the pain with 3 rounds of pain medication but I anticipate this will not be sufficient to control her pain particularly with ambulation.  Additionally her blood pressure remains extremely high in the range of 215/78 as recently as 4:00 AM.  My goal is not to drop her too low given that she may be chronically hypertensive but her blood pressure is uncontrolled at this time it could be the cause of her prior chest pain and her slightly elevated troponin at 0.03.  I  will give her a small dose of hydralazine 5 mg IV as well as the rounds of narcotic pain medicine.  I discussed the case with Dr. Marcille Blanco and he will admit.  Final diagnoses:  Fall, initial encounter  Contusion of knee, unspecified laterality, initial encounter  Inability to ambulate due to multiple joints  Closed fracture of ramus of left pubis, initial encounter ([place])   Uncontrolled hypertension  Chest pain, unspecified type  Elevated troponin I level      Hinda Kehr, MD 10/31/17 [phone]

## 2017-10-31 NOTE — Consult Note (Signed)
ORTHOPAEDIC CONSULTATION  REQUESTING PHYSICIAN: Demetrios Loll, MD  Chief Complaint: bilateral hip and knee pain  HPI: Tara [name] is a 84 y.o. female who complains of lateral hip and knee pain after a fall yesterday. Patient states that she fell forward and landed on both knees. She developed bilateral knee pain, right greater than left and hip pain after the fall.  Patient was unable to ambulate in the ER. X-rays of the knees and hips were taken. Patient was noted to have an acute fracture of the superior and inferiorrami on the left side.  Patient seen in her room surrounded by family. Her biggest complaint today is right knee pain.  Past Medical History:  Diagnosis Date  . Asthma   . Diabetes mellitus ([place])   . Environmental allergies   . Hypertension    Past Surgical History:  Procedure Laterality Date  . APPENDECTOMY    . TUBAL LIGATION     Social History   Socioeconomic History  . Marital status: Widowed    Spouse name: Not on file  . Number of children: Not on file  . Years of education: Not on file  . Highest education level: Not on file  Occupational History  . Not on file  Social Needs  . Financial resource strain: Not on file  . Food insecurity:    Worry: Not on file    Inability: Not on file  . Transportation needs:    Medical: Not on file    Non-medical: Not on file  Tobacco Use  . Smoking status: Never Smoker  . Smokeless tobacco: Never Used  Substance and Sexual Activity  . Alcohol use: No    Alcohol/week: 0.0 oz  . Drug use: No  . Sexual activity: Never  Lifestyle  . Physical activity:    Days per week: Not on file    Minutes per session: Not on file  . Stress: Not on file  Relationships  . Social connections:    Talks on phone: Not on file    Gets together: Not on file    Attends religious service: Not on file    Active member of club or organization: Not on file    Attends meetings of clubs or organizations: Not on file    Relationship  status: Not on file  Other Topics Concern  . Not on file  Social History Narrative  . Not on file   Family History  Problem Relation Age of Onset  . Hypertension Mother   . Cancer Brother        lung (question)  . Cancer Sister        questionable type   Allergies  Allergen Reactions  . Ace Inhibitors Other (See Comments)    Angioedema  . Hydrochlorothiazide W-Triamterene Other (See Comments)    Gout flare  . Lisinopril Swelling and Other (See Comments)  . Penicillins Swelling  . Trichlormethiazide Other (See Comments)    Gout flare   Prior to Admission medications   Medication Sig Start Date End Date Taking? Authorizing Provider  acetaminophen (TYLENOL) 325 MG tablet Take 650 mg by mouth every 6 (six) hours as needed for mild pain.    Yes [provider]  amLODipine (NORVASC) 5 MG tablet Take 1 tablet (5 mg total) by mouth daily. 08/10/17  Yes Einar Pheasant, MD  aspirin 81 MG tablet Take 81 mg by mouth daily.   Yes [provider]  Calcium Carbonate-Vitamin D (CALTRATE 600+D PO) Take 1 tablet  by mouth daily.    Yes [provider]  Cholecalciferol (D3 SUPER STRENGTH) 2000 UNITS CAPS Take 2,000 Units by mouth daily.    Yes [provider]  fexofenadine (ALLEGRA) 180 MG tablet Take 1 tablet (180 mg total) by mouth daily. 10/13/17  Yes Einar Pheasant, MD  glucose blood (ACCU-CHEK AVIVA PLUS) test strip CHECK SUGAR ONCE DAILY 09/24/15  Yes Einar Pheasant, MD  hydrALAZINE (APRESOLINE) 50 MG tablet TAKE ONE (1) TABLET THREE (3) TIMES EACH DAY 10/13/17  Yes Einar Pheasant, MD  metoprolol succinate (TOPROL-XL) 25 MG 24 hr tablet Take 1 tablet (25 mg total) by mouth 2 (two) times daily. 08/25/17  Yes Einar Pheasant, MD  potassium chloride (K-DUR) 10 MEQ tablet Take 1 tablet (10 mEq total) by mouth daily. 10/23/17  Yes Einar Pheasant, MD  SYNTHROID 50 MCG tablet TAKE ONE (1) TABLET EACH DAY 09/24/17  Yes Einar Pheasant, MD  fluticasone (FLONASE) 50  MCG/ACT nasal spray Place 2 sprays daily into both nostrils. Patient not taking: Reported on 10/31/2017 06/17/17   Lucille Passy, MD  furosemide (LASIX) 20 MG tablet Take 1 tablet (20 mg total) by mouth daily. Patient not taking: Reported on 10/31/2017 08/26/17   Einar Pheasant, MD   Dg Chest Port 1 View  Result Date: 10/30/2017 CLINICAL DATA:  Chest pain. EXAM: PORTABLE CHEST 1 VIEW COMPARISON:  07/06/2014 FINDINGS: Unchanged cardiomegaly with tortuous atherosclerotic thoracic aorta. No pulmonary edema. No consolidation, pleural effusion or pneumothorax. Degenerative change in both shoulders. IMPRESSION: Chronic cardiomegaly and tortuous atherosclerotic thoracic aorta. No acute abnormality. Electronically Signed   By: Jeb Levering M.D.   On: 10/30/2017 23:11   Dg Knee Complete 4 Views Left  Result Date: 10/30/2017 CLINICAL DATA:  Fall with knee pain EXAM: LEFT KNEE - COMPLETE 4+ VIEW COMPARISON:  12/29/2013 FINDINGS: No acute displaced fracture or malalignment. Mild patellofemoral and medial joint space degenerative change. Trace knee effusion. Vascular calcification. IMPRESSION: Mild degenerative changes. Trace knee effusion. No acute fracture or malalignment Electronically Signed   By: Donavan Foil M.D.   On: 10/30/2017 20:05   Dg Knee Complete 4 Views Right  Result Date: 10/30/2017 CLINICAL DATA:  Fall with knee pain EXAM: RIGHT KNEE - COMPLETE 4+ VIEW COMPARISON:  None. FINDINGS: No acute displaced fracture or malalignment. Mild patellofemoral and medial joint space degenerative change. Vascular calcification. No large knee effusion. IMPRESSION: No acute osseous abnormality Electronically Signed   By: Donavan Foil M.D.   On: 10/30/2017 20:06   Dg Hips Bilat W Or Wo Pelvis 3-4 Views  Result Date: 10/31/2017 CLINICAL DATA:  Pain after fall. Bilateral hip pain. Cannot support weight. Right worse than left. EXAM: DG HIP (WITH OR WITHOUT PELVIS) 3-4V BILAT COMPARISON:  Reformats from abdominal  CT 06/26/2017 FINDINGS: Right inferior pubic ramus fracture is remote in unchanged from prior CT. No evidence of acute fracture. Femoral head remains seated. Fracture of the left superior pubic ramus is new from prior exam, minimally displaced. Probable nondisplaced left inferior pubic ramus fracture. Femoral head remains seated. Pubic symphysis and sacroiliac joints remain congruent. Advanced vascular calcifications. IMPRESSION: 1. Acute left superior pubic ramus fracture, minimally displaced, suspect nondisplaced left inferior ramus fracture. 2. Remote right inferior pubic ramus fracture. No acute fracture of the right hip. Electronically Signed   By: Jeb Levering M.D.   On: 10/31/2017 05:03    Positive ROS: All other systems have been reviewed and were otherwise negative with the exception of those mentioned in the HPI  and as above.  Physical Exam: General: Alert, no acute distress  MUSCULOSKELETAL:  Right knee: Patient has intact skin. She has a small effusion. There is no ecchymosis or deformity seen.  There is no palpable defect of the patella. Patient can actively flex and extend her knee although range of motion is limited due to pain and swelling.  Left knee: Patient's skin is intact. There is no effusion. She can flex and extend her knee actively. Distally she is neurovascularly intact in both lower extremities. She can flex and extend her toes and dorsiflex and plantarflex her ankle. She hale pedal pulses and intact sensation light touch throughout the bilateral lower extremities.  Bilateral hips: Patient had mild pain with logrolling and internal and external rotation of both hips.She has mild tenderness to palpation of the left superior pubic ramus.  Assessment: Bilateral knee contusions Left pubic rami fractures  Plan: I reviewed the patient's x-rays. I do not see evidence of hip fractures or fractures about the knee. The patient has degenerative changes in both knees.  The  patient's right knee pain is likely secondary to contusion versus exacerbation of underlying arthritis.  I recommend the patient apply ice to her knees as needed. She may take Tylenol for pain.I'm ordering physical therapy evaluation for the morning. If patient continues to be unable to bear weight on her lower extremities. Further imaging may be considered with CT scan.  Patient will likely need a skilled nursing facility upon discharge.    Thornton Park, MD    10/31/2017 7:29 PM

## 2017-10-31 NOTE — Progress Notes (Signed)
PT Cancellation Note  Patient Details Name: Tara [name] MRN: [id number] DOB: 09/19/1917   Cancelled Treatment:    Reason Eval/Treat Not Completed: Other (comment);Patient not medically ready(Chart reviewed: pt awaiting ortho consult. Per ED MD noted, pt is with anticipated NWB. Will wait for Ortho to see first and update weight bearing status. )  10:53 AM, 10/31/17 Etta Grandchild, PT, DPT Physical Therapist - [facility]  [phone] ([place])     Lashe Oliveira C 10/31/2017, 10:53 AM

## 2017-11-01 ENCOUNTER — Observation Stay: Payer: Medicare Other

## 2017-11-01 DIAGNOSIS — I160 Hypertensive urgency: Secondary | ICD-10-CM | POA: Diagnosis not present

## 2017-11-01 DIAGNOSIS — E43 Unspecified severe protein-calorie malnutrition: Secondary | ICD-10-CM

## 2017-11-01 DIAGNOSIS — E039 Hypothyroidism, unspecified: Secondary | ICD-10-CM | POA: Diagnosis not present

## 2017-11-01 DIAGNOSIS — E119 Type 2 diabetes mellitus without complications: Secondary | ICD-10-CM | POA: Diagnosis not present

## 2017-11-01 DIAGNOSIS — M25461 Effusion, right knee: Secondary | ICD-10-CM | POA: Diagnosis not present

## 2017-11-01 DIAGNOSIS — S32599A Other specified fracture of unspecified pubis, initial encounter for closed fracture: Secondary | ICD-10-CM | POA: Diagnosis not present

## 2017-11-01 LAB — GLUCOSE, CAPILLARY
Glucose-Capillary: 154 mg/dL — ABNORMAL HIGH (ref 65–99)
Glucose-Capillary: 147 mg/dL — ABNORMAL HIGH (ref 65–99)
Glucose-Capillary: 133 mg/dL — ABNORMAL HIGH (ref 65–99)
Glucose-Capillary: 150 mg/dL — ABNORMAL HIGH (ref 65–99)

## 2017-11-01 LAB — HEMOGLOBIN A1C
Hgb A1c MFr Bld: 6.4 % — ABNORMAL HIGH (ref 4.8–5.6)
Mean Plasma Glucose: 136.98 mg/dL

## 2017-11-01 MED ORDER — METOPROLOL SUCCINATE ER 50 MG PO TB24
50.0000 mg | ORAL_TABLET | Freq: Two times a day (BID) | ORAL | Status: DC
  Administered 2017-11-01 – 2017-11-02 (×2): 50 mg via ORAL
  Filled 2017-11-01 (×2): qty 1

## 2017-11-01 MED ORDER — ENSURE ENLIVE PO LIQD
237.0000 mL | Freq: Two times a day (BID) | ORAL | Status: DC
  Administered 2017-11-02: 237 mL via ORAL

## 2017-11-01 NOTE — Progress Notes (Signed)
Subjective:  Patient seen in bed eating lunch.  She states she had PT earlier today.  Family is at the bedside.  Complains of hip and thigh pain today.  Objective:   VITALS:   Vitals:   10/31/17 1739 10/31/17 1955 11/01/17 0350 11/01/17 0931  BP:  (!) 144/47 (!) 180/59 (!) 178/54  Pulse:  80 79 79  Resp:  17 18 16   Temp: 99.2 F (37.3 C) 99.9 F (37.7 C) 99.6 F (37.6 C) 99.3 F (37.4 C)  TempSrc: Oral Oral Oral Oral  SpO2:  97% 97% 98%  Weight:   40.2 kg (88 lb 11.2 oz)     PHYSICAL EXAM: Bilateral lower extremity: Neurovascular intact Sensation intact distally Intact pulses distally Dorsiflexion/Plantar flexion intact Compartment soft  LABS  Results for orders placed or performed during the hospital encounter of 10/30/17 (from the past 24 hour(s))  Glucose, capillary     Status: None   Collection Time: 10/31/17  4:54 PM  Result Value Ref Range   Glucose-Capillary 89 65 - 99 mg/dL  Glucose, capillary     Status: Abnormal   Collection Time: 10/31/17  9:45 PM  Result Value Ref Range   Glucose-Capillary 186 (H) 65 - 99 mg/dL  Glucose, capillary     Status: Abnormal   Collection Time: 11/01/17  7:42 AM  Result Value Ref Range   Glucose-Capillary 154 (H) 65 - 99 mg/dL   Comment 1 Notify RN    Comment 2 Document in Chart   Glucose, capillary     Status: Abnormal   Collection Time: 11/01/17 12:04 PM  Result Value Ref Range   Glucose-Capillary 147 (H) 65 - 99 mg/dL   Comment 1 Notify RN    Comment 2 Document in Chart     Dg Chest Port 1 View  Result Date: 10/30/2017 CLINICAL DATA:  Chest pain. EXAM: PORTABLE CHEST 1 VIEW COMPARISON:  07/06/2014 FINDINGS: Unchanged cardiomegaly with tortuous atherosclerotic thoracic aorta. No pulmonary edema. No consolidation, pleural effusion or pneumothorax. Degenerative change in both shoulders. IMPRESSION: Chronic cardiomegaly and tortuous atherosclerotic thoracic aorta. No acute abnormality. Electronically Signed   By: Jeb Levering M.D.   On: 10/30/2017 23:11   Dg Knee Complete 4 Views Left  Result Date: 10/30/2017 CLINICAL DATA:  Fall with knee pain EXAM: LEFT KNEE - COMPLETE 4+ VIEW COMPARISON:  12/29/2013 FINDINGS: No acute displaced fracture or malalignment. Mild patellofemoral and medial joint space degenerative change. Trace knee effusion. Vascular calcification. IMPRESSION: Mild degenerative changes. Trace knee effusion. No acute fracture or malalignment Electronically Signed   By: Donavan Foil M.D.   On: 10/30/2017 20:05   Dg Knee Complete 4 Views Right  Result Date: 10/30/2017 CLINICAL DATA:  Fall with knee pain EXAM: RIGHT KNEE - COMPLETE 4+ VIEW COMPARISON:  None. FINDINGS: No acute displaced fracture or malalignment. Mild patellofemoral and medial joint space degenerative change. Vascular calcification. No large knee effusion. IMPRESSION: No acute osseous abnormality Electronically Signed   By: Donavan Foil M.D.   On: 10/30/2017 20:06   Dg Hips Bilat W Or Wo Pelvis 3-4 Views  Result Date: 10/31/2017 CLINICAL DATA:  Pain after fall. Bilateral hip pain. Cannot support weight. Right worse than left. EXAM: DG HIP (WITH OR WITHOUT PELVIS) 3-4V BILAT COMPARISON:  Reformats from abdominal CT 06/26/2017 FINDINGS: Right inferior pubic ramus fracture is remote in unchanged from prior CT. No evidence of acute fracture. Femoral head remains seated. Fracture of the left superior pubic ramus is new from prior  exam, minimally displaced. Probable nondisplaced left inferior pubic ramus fracture. Femoral head remains seated. Pubic symphysis and sacroiliac joints remain congruent. Advanced vascular calcifications. IMPRESSION: 1. Acute left superior pubic ramus fracture, minimally displaced, suspect nondisplaced left inferior ramus fracture. 2. Remote right inferior pubic ramus fracture. No acute fracture of the right hip. Electronically Signed   By: Jeb Levering M.D.   On: 10/31/2017 05:03    Assessment/Plan:      Active Problems:   Hypertensive urgency  Patient continues to complain of pain in the right knee with PT today.  Recommend CT of the right knee to evaluate for occult fracture.     Thornton Park , MD 11/01/2017, 1:25 PM

## 2017-11-01 NOTE — Evaluation (Signed)
Physical Therapy Evaluation Patient Details Name: Tara [name] MRN: [id number] DOB: 10-Jul-1918 Today's Date: 11/01/2017   History of Present Illness  84 yo female with onset of fall at home on her knees was admitted for evaluation of her pain.  Noted flat troponin, has acute fracture of superior and inferior rami on L pelvis.  R knee edema and pain, prev was painful before fall.  PMHx:  asthma, DM, HTN,   Clinical Impression  Pt is up to walk sidestepping side of bed with help for wgt of walker and her pain in RLE.  Has been premedicated but continues to note significant pain on R knee especially. Her plan is to progress gait and strengthening as her pain permits, then transition to SNF for strengthening and increased stability of her LE's during gait.  Pt is home alone but wants to try to return to that environment.     Follow Up Recommendations SNF    Equipment Recommendations  Rolling walker with 5" wheels(if she does not have a walker )    Recommendations for Other Services       Precautions / Restrictions Precautions Precautions: Fall(telemetry) Restrictions Weight Bearing Restrictions: No      Mobility  Bed Mobility Overal bed mobility: Needs Assistance Bed Mobility: Supine to Sit;Sit to Supine     Supine to sit: Mod assist Sit to supine: Max assist   General bed mobility comments: talked with pt about body mechanics and then assisted in good posture to side of bed and back  Transfers Overall transfer level: Needs assistance Equipment used: Rolling walker (2 wheeled);1 person hand held assist Transfers: Sit to/from Stand Sit to Stand: Mod assist         General transfer comment: mod to power up then min to min guard to maintain posture standing  Ambulation/Gait Ambulation/Gait assistance: Min assist Ambulation Distance (Feet): 5 Feet Assistive device: Rolling walker (2 wheeled);1 person hand held assist Gait Pattern/deviations: Step-to pattern;Decreased  stride length;Trunk flexed;Wide base of support Gait velocity: reduced Gait velocity interpretation: Below normal speed for age/gender General Gait Details: assisted her with shifting walker and controlling balance  Stairs            Wheelchair Mobility    Modified Rankin (Stroke Patients Only)       Balance Overall balance assessment: Needs assistance;History of Falls Sitting-balance support: Feet supported;Bilateral upper extremity supported Sitting balance-Leahy Scale: Fair     Standing balance support: Bilateral upper extremity supported;During functional activity Standing balance-Leahy Scale: Poor                               Pertinent Vitals/Pain Pain Assessment: 0-10 Pain Score: 9  Pain Location: R knee Pain Descriptors / Indicators: Aching;Sharp Pain Intervention(s): Limited activity within patient's tolerance;Monitored during session;Premedicated before session;Repositioned    Home Living Family/patient expects to be discharged to:: Skilled nursing facility Living Arrangements: Alone                    Prior Function Level of Independence: Independent with assistive device(s)         Comments: used SPC prior to her fall     Hand Dominance        Extremity/Trunk Assessment   Upper Extremity Assessment Upper Extremity Assessment: Generalized weakness    Lower Extremity Assessment Lower Extremity Assessment: Generalized weakness    Cervical / Trunk Assessment Cervical / Trunk Assessment: Kyphotic  Communication  Communication: HOH  Cognition Arousal/Alertness: Awake/alert Behavior During Therapy: WFL for tasks assessed/performed Overall Cognitive Status: Within Functional Limits for tasks assessed                                 General Comments: following instructions well      General Comments      Exercises     Assessment/Plan    PT Assessment Patient needs continued PT services  PT  Problem List Decreased strength;Decreased range of motion;Decreased activity tolerance;Decreased balance;Decreased mobility;Decreased coordination;Decreased knowledge of use of DME;Decreased safety awareness;Decreased skin integrity;Pain       PT Treatment Interventions DME instruction;Gait training;Functional mobility training;Therapeutic activities;Therapeutic exercise;Balance training;Neuromuscular re-education;Patient/family education    PT Goals (Current goals can be found in the Care Plan section)  Acute Rehab PT Goals Patient Stated Goal: to get up but not to fall PT Goal Formulation: With patient/family Time For Goal Achievement: 11/15/17 Potential to Achieve Goals: Good    Frequency 7X/week   Barriers to discharge Inaccessible home environment;Decreased caregiver support home alone with sporadic family help    Co-evaluation               AM-PAC PT "6 Clicks" Daily Activity  Outcome Measure Difficulty turning over in bed (including adjusting bedclothes, sheets and blankets)?: Unable Difficulty moving from lying on back to sitting on the side of the bed? : Unable Difficulty sitting down on and standing up from a chair with arms (e.g., wheelchair, bedside commode, etc,.)?: Unable Help needed moving to and from a bed to chair (including a wheelchair)?: A Lot Help needed walking in hospital room?: A Lot Help needed climbing 3-5 steps with a railing? : Total 6 Click Score: 8    End of Session Equipment Utilized During Treatment: Gait belt Activity Tolerance: Patient limited by pain Patient left: in bed;with call bell/phone within reach;with family/visitor present Nurse Communication: Mobility status;Patient requests pain meds PT Visit Diagnosis: Unsteadiness on feet (R26.81);Muscle weakness (generalized) (M62.81);History of falling (Z91.81);Difficulty in walking, not elsewhere classified (R26.2);Adult, failure to thrive (R62.7);Pain Pain - Right/Left: (Bilateral) Pain -  part of body: Knee;Hip    Time: 1001-1023 PT Time Calculation (min) (ACUTE ONLY): 22 min   Charges:   PT Evaluation $PT Eval Moderate Complexity: 1 Mod     PT G Codes:   PT G-Codes **NOT FOR INPATIENT CLASS** Functional Assessment Tool Used: AM-PAC 6 Clicks Basic Mobility    Ramond Dial 11/01/2017, 1:03 PM   Mee Hives, PT MS Acute Rehab Dept. Number: [place]

## 2017-11-01 NOTE — Progress Notes (Signed)
I have reviewed the patient's CT scan and report.  There is no evidence of acute fracture.  Patient may be WBAT on both lower extremities and should continue physical therapy as ordered.

## 2017-11-01 NOTE — Progress Notes (Signed)
[facility] [place] NAME: Tara [name]    MR#:  [id number]  DATE OF BIRTH:  1917-08-17  SUBJECTIVE:  CHIEF COMPLAINT:   Chief Complaint  Patient presents with  . Fall   The patient still complains of right knee pain REVIEW OF SYSTEMS:  Review of Systems  Constitutional: Negative for chills, fever and malaise/fatigue.  HENT: Negative for sore throat.   Eyes: Negative for blurred vision and double vision.  Respiratory: Negative for cough, hemoptysis, shortness of breath, wheezing and stridor.   Cardiovascular: Negative for chest pain, palpitations, orthopnea and leg swelling.  Gastrointestinal: Negative for abdominal pain, blood in stool, diarrhea, melena, nausea and vomiting.  Genitourinary: Negative for dysuria, flank pain and hematuria.  Musculoskeletal: Positive for joint pain. Negative for back pain.  Skin: Negative for rash.  Neurological: Negative for dizziness, sensory change, focal weakness, seizures, loss of consciousness, weakness and headaches.  Endo/Heme/Allergies: Negative for polydipsia.  Psychiatric/Behavioral: Negative for depression. The patient is not nervous/anxious.     DRUG ALLERGIES:   Allergies  Allergen Reactions  . Ace Inhibitors Other (See Comments)    Angioedema  . Hydrochlorothiazide W-Triamterene Other (See Comments)    Gout flare  . Lisinopril Swelling and Other (See Comments)  . Penicillins Swelling  . Trichlormethiazide Other (See Comments)    Gout flare   VITALS:  Blood pressure (!) 175/57, pulse 74, temperature 99.4 F (37.4 C), temperature source Oral, resp. rate 16, weight 88 lb 11.2 oz (40.2 kg), SpO2 96 %. PHYSICAL EXAMINATION:  Physical Exam  Constitutional: She is oriented to person, place, and time and well-developed, well-nourished, and in no distress.  HENT:  Head: Normocephalic.  Mouth/Throat: Oropharynx is clear and moist.  Eyes: Pupils are equal, round, and reactive to light.  Conjunctivae and EOM are normal. No scleral icterus.  Neck: Normal range of motion. Neck supple. No JVD present. No tracheal deviation present.  Cardiovascular: Normal rate, regular rhythm and normal heart sounds. Exam reveals no gallop.  No murmur heard. Pulmonary/Chest: Effort normal and breath sounds normal. No respiratory distress. She has no wheezes. She has no rales.  Abdominal: Soft. Bowel sounds are normal. She exhibits no distension. There is no tenderness. There is no rebound.  Musculoskeletal: She exhibits tenderness. She exhibits no edema.  Tenderness on right knee, mild swelling, no erythema.  Neurological: She is alert and oriented to person, place, and time. No cranial nerve deficit.  Skin: No rash noted. No erythema.  Psychiatric: Affect normal.   LABORATORY PANEL:  Female CBC Recent Labs  Lab 10/30/17 2307  WBC 10.0  HGB 12.1  HCT 35.3  PLT 226   ------------------------------------------------------------------------------------------------------------------ Chemistries  Recent Labs  Lab 10/30/17 2307  NA 140  K 4.2  CL 108  CO2 24  GLUCOSE 192*  BUN 32*  CREATININE 0.83  CALCIUM 9.1   RADIOLOGY:  No results found. ASSESSMENT AND PLAN:   This is a 84 year old female admitted for pelvic fracture. 1.  Hypertensive urgency: Improved with his home hypertension medication and IV hydralazine as needed 2.  Pubic ramus fracture. Pain control.  PT evaluation suggest skilled nursing facility placement.    Right knee pain.  The patient still complains of right knee pain.  Per Dr. Geanie Cooley, get her right knee CAT scan to rule out occult fracture.  3.  Diabetes mellitus type 2: Hold oral hypoglycemic agents.  Sliding cell insulin.  4.  Both thyroidism: continue Synthroid  Dehydration.  Encourage oral fluid intake. Severe malnutrition.  Follow up dietitian.  All the records are reviewed and case discussed with Care Management/Social Worker. Management  plans discussed with the patient, her daughters and they are in agreement.  They want the patient to be placed in nursing home.  CODE STATUS: Full Code  TOTAL TIME TAKING CARE OF THIS PATIENT: 28 minutes.   More than 50% of the time was spent in counseling/coordination of care: YES  POSSIBLE D/C IN 1 DAYS, DEPENDING ON CLINICAL CONDITION.   Demetrios Loll M.D on 11/01/2017 at 4:05 PM  Between 7am to 6pm - Pager - [phone]  After 6pm go to [email or web address] - [facility]

## 2017-11-01 NOTE — Progress Notes (Signed)
Initial Nutrition Assessment  DOCUMENTATION CODES:   Severe malnutrition in context of social or environmental circumstances  INTERVENTION:  Recommend liberalizing diet to regular.  Provide Ensure Enlive po BID, each supplement provides 350 kcal and 20 grams of protein. Patient prefers vanilla.  NUTRITION DIAGNOSIS:   Severe Malnutrition related to social / environmental circumstances(advanced age) as evidenced by severe fat depletion, severe muscle depletion.  GOAL:   Patient will meet greater than or equal to 90% of their needs  MONITOR:   PO intake, Supplement acceptance, Labs, Weight trends, I & O's  REASON FOR ASSESSMENT:   Malnutrition Screening Tool    ASSESSMENT:   84 year old female with PMHx of asthma, DM type 2, HTN who is admitted with lateral hip and knee pain after a fall found to have bilateral knee contusions and left pubic rami fractures.   -Patient went for another CT scan of right knee today. Still pending results.  Met with patient's family members at bedside. Patient was resting at time of assessment. Family reports patient's appetite is about the same as normal. She typically eats 2-3 meals per day. They are unable to provide any further details on intake. She drinks vanilla Ensure. Denies any abdominal pain, N/V, or difficulty chewing/swallowing.  UBW 90 lbs. They report patient has lost 1-2 lbs over the past 1-2 months.  Medications reviewed and include: Oscal with D 1 tablet daily, vitamin D 2000 units daily, Colace, Novolog 0-9 units TID, Novolog 0-5 units QHS, levothyroxine, potassium chloride 10 mEq daily PO.  Labs reviewed: CBG 89-186 past 24 hrs. HgbA1c 6.4 on 3/23.  NUTRITION - FOCUSED PHYSICAL EXAM:    Most Recent Value  Orbital Region  Severe depletion  Upper Arm Region  Severe depletion  Thoracic and Lumbar Region  Severe depletion  Buccal Region  Severe depletion  Temple Region  Severe depletion  Clavicle Bone Region  Severe  depletion  Clavicle and Acromion Bone Region  Severe depletion  Scapular Bone Region  Severe depletion  Dorsal Hand  Severe depletion  Patellar Region  Unable to assess  Anterior Thigh Region  Unable to assess  Posterior Calf Region  Unable to assess  Edema (RD Assessment)  Unable to assess  Hair  Reviewed  Eyes  Unable to assess  Mouth  Unable to assess  Skin  Reviewed  Nails  Reviewed     Diet Order:  Diet Heart Room service appropriate? Yes; Fluid consistency: Thin  EDUCATION NEEDS:   Not appropriate for education at this time  Skin:  Skin Assessment: Reviewed RN Assessment(skin tear left ankle)  Last BM:  10/30/2017  Height:   Ht Readings from Last 1 Encounters:  09/07/17 4' 10"  (1.473 m)    Weight:   Wt Readings from Last 1 Encounters:  11/01/17 88 lb 11.2 oz (40.2 kg)    Ideal Body Weight:  40.9 kg  BMI:  Body mass index is 18.54 kg/m.  Estimated Nutritional Needs:   Kcal:  1200-1400 (30-35 kcal/kg)  Protein:  60-70 grams (1.5-1.7 grams/kg)  Fluid:  1.2-1.4 L/day (25 mL/kcal)  Willey Blade, MS, RD, LDN Office: [phone] Pager: [phone] After Hours/Weekend Pager: [phone]

## 2017-11-02 ENCOUNTER — Encounter
Admission: RE | Admit: 2017-11-02 | Discharge: 2017-11-02 | Disposition: A | Discharge status: Home / Self Care | Payer: Medicare Other | Source: Ambulatory Visit | Attending: Internal Medicine | Admitting: Internal Medicine

## 2017-11-02 DIAGNOSIS — Z8719 Personal history of other diseases of the digestive system: Secondary | ICD-10-CM | POA: Diagnosis not present

## 2017-11-02 DIAGNOSIS — I10 Essential (primary) hypertension: Secondary | ICD-10-CM | POA: Diagnosis not present

## 2017-11-02 DIAGNOSIS — Z7982 Long term (current) use of aspirin: Secondary | ICD-10-CM | POA: Diagnosis not present

## 2017-11-02 DIAGNOSIS — M25461 Effusion, right knee: Secondary | ICD-10-CM | POA: Diagnosis not present

## 2017-11-02 DIAGNOSIS — M8000XD Age-related osteoporosis with current pathological fracture, unspecified site, subsequent encounter for fracture with routine healing: Secondary | ICD-10-CM | POA: Diagnosis not present

## 2017-11-02 DIAGNOSIS — Z7401 Bed confinement status: Secondary | ICD-10-CM | POA: Diagnosis not present

## 2017-11-02 DIAGNOSIS — R0602 Shortness of breath: Secondary | ICD-10-CM | POA: Diagnosis not present

## 2017-11-02 DIAGNOSIS — S32512D Fracture of superior rim of left pubis, subsequent encounter for fracture with routine healing: Secondary | ICD-10-CM | POA: Diagnosis not present

## 2017-11-02 DIAGNOSIS — E860 Dehydration: Secondary | ICD-10-CM | POA: Diagnosis not present

## 2017-11-02 DIAGNOSIS — K560 Paralytic ileus: Secondary | ICD-10-CM | POA: Diagnosis not present

## 2017-11-02 DIAGNOSIS — J189 Pneumonia, unspecified organism: Secondary | ICD-10-CM | POA: Diagnosis not present

## 2017-11-02 DIAGNOSIS — Z9109 Other allergy status, other than to drugs and biological substances: Secondary | ICD-10-CM | POA: Diagnosis not present

## 2017-11-02 DIAGNOSIS — E43 Unspecified severe protein-calorie malnutrition: Secondary | ICD-10-CM | POA: Diagnosis not present

## 2017-11-02 DIAGNOSIS — S32592A Other specified fracture of left pubis, initial encounter for closed fracture: Secondary | ICD-10-CM | POA: Diagnosis not present

## 2017-11-02 DIAGNOSIS — Z79899 Other long term (current) drug therapy: Secondary | ICD-10-CM | POA: Diagnosis not present

## 2017-11-02 DIAGNOSIS — J45909 Unspecified asthma, uncomplicated: Secondary | ICD-10-CM | POA: Diagnosis not present

## 2017-11-02 DIAGNOSIS — S8001XA Contusion of right knee, initial encounter: Secondary | ICD-10-CM | POA: Diagnosis not present

## 2017-11-02 DIAGNOSIS — R109 Unspecified abdominal pain: Secondary | ICD-10-CM | POA: Diagnosis not present

## 2017-11-02 DIAGNOSIS — E1151 Type 2 diabetes mellitus with diabetic peripheral angiopathy without gangrene: Secondary | ICD-10-CM | POA: Diagnosis not present

## 2017-11-02 DIAGNOSIS — J309 Allergic rhinitis, unspecified: Secondary | ICD-10-CM | POA: Diagnosis not present

## 2017-11-02 DIAGNOSIS — R262 Difficulty in walking, not elsewhere classified: Secondary | ICD-10-CM | POA: Diagnosis not present

## 2017-11-02 DIAGNOSIS — R2681 Unsteadiness on feet: Secondary | ICD-10-CM | POA: Diagnosis not present

## 2017-11-02 DIAGNOSIS — M6281 Muscle weakness (generalized): Secondary | ICD-10-CM | POA: Diagnosis not present

## 2017-11-02 DIAGNOSIS — M810 Age-related osteoporosis without current pathological fracture: Secondary | ICD-10-CM | POA: Diagnosis not present

## 2017-11-02 DIAGNOSIS — Z9181 History of falling: Secondary | ICD-10-CM | POA: Diagnosis not present

## 2017-11-02 DIAGNOSIS — I160 Hypertensive urgency: Secondary | ICD-10-CM | POA: Diagnosis not present

## 2017-11-02 DIAGNOSIS — M25559 Pain in unspecified hip: Secondary | ICD-10-CM | POA: Diagnosis not present

## 2017-11-02 DIAGNOSIS — E119 Type 2 diabetes mellitus without complications: Secondary | ICD-10-CM | POA: Diagnosis not present

## 2017-11-02 DIAGNOSIS — R0689 Other abnormalities of breathing: Secondary | ICD-10-CM | POA: Diagnosis not present

## 2017-11-02 DIAGNOSIS — E039 Hypothyroidism, unspecified: Secondary | ICD-10-CM | POA: Diagnosis not present

## 2017-11-02 DIAGNOSIS — R52 Pain, unspecified: Secondary | ICD-10-CM | POA: Diagnosis not present

## 2017-11-02 DIAGNOSIS — G9340 Encephalopathy, unspecified: Secondary | ICD-10-CM | POA: Diagnosis not present

## 2017-11-02 DIAGNOSIS — R079 Chest pain, unspecified: Secondary | ICD-10-CM | POA: Diagnosis not present

## 2017-11-02 DIAGNOSIS — Z79890 Hormone replacement therapy (postmenopausal): Secondary | ICD-10-CM | POA: Diagnosis not present

## 2017-11-02 DIAGNOSIS — D649 Anemia, unspecified: Secondary | ICD-10-CM | POA: Diagnosis not present

## 2017-11-02 DIAGNOSIS — S32599A Other specified fracture of unspecified pubis, initial encounter for closed fracture: Secondary | ICD-10-CM | POA: Diagnosis not present

## 2017-11-02 DIAGNOSIS — R748 Abnormal levels of other serum enzymes: Secondary | ICD-10-CM | POA: Diagnosis not present

## 2017-11-02 DIAGNOSIS — R488 Other symbolic dysfunctions: Secondary | ICD-10-CM | POA: Diagnosis not present

## 2017-11-02 LAB — BASIC METABOLIC PANEL
Anion gap: 9 (ref 5–15)
BUN: 23 mg/dL — AB (ref 6–20)
CHLORIDE: 107 mmol/L (ref 101–111)
CO2: 23 mmol/L (ref 22–32)
CREATININE: 0.81 mg/dL (ref 0.44–1.00)
Calcium: 8.4 mg/dL — ABNORMAL LOW (ref 8.9–10.3)
GFR calc Af Amer: >60 mL/min (ref >60)
GFR calc non Af Amer: 59 mL/min — ABNORMAL LOW (ref >60)
GLUCOSE: 167 mg/dL — AB (ref 65–99)
Potassium: 4.3 mmol/L (ref 3.5–5.1)
Sodium: 139 mmol/L (ref 135–145)

## 2017-11-02 LAB — GLUCOSE, CAPILLARY
Glucose-Capillary: 157 mg/dL — ABNORMAL HIGH (ref 65–99)
Glucose-Capillary: 161 mg/dL — ABNORMAL HIGH (ref 65–99)

## 2017-11-02 MED ORDER — METOPROLOL SUCCINATE ER 50 MG PO TB24: 50.0000 mg | ORAL_TABLET | Freq: Two times a day (BID) | ORAL | Status: DC

## 2017-11-02 MED ORDER — BISACODYL 5 MG PO TBEC
10.0000 mg | DELAYED_RELEASE_TABLET | Freq: Every day | ORAL | Status: DC | PRN
  Administered 2017-11-02: 10 mg via ORAL
  Filled 2017-11-02: qty 2

## 2017-11-02 MED ORDER — BISACODYL 10 MG RE SUPP
10.0000 mg | Freq: Every day | RECTAL | Status: DC | PRN
  Filled 2017-11-02: qty 1

## 2017-11-02 MED ORDER — SODIUM CHLORIDE 0.9% FLUSH
3.0000 mL | Freq: Two times a day (BID) | INTRAVENOUS | Status: DC
Start: 2017-11-02 — End: 2017-11-02
  Administered 2017-11-02: 3 mL via INTRAVENOUS

## 2017-11-02 MED ORDER — OXYCODONE-ACETAMINOPHEN 5-325 MG PO TABS: 1.0000 | ORAL_TABLET | Freq: Four times a day (QID) | ORAL | Status: DC | PRN

## 2017-11-02 MED ORDER — OXYCODONE-ACETAMINOPHEN 5-325 MG PO TABS: 1.0000 | ORAL_TABLET | Freq: Four times a day (QID) | ORAL | 0 refills | Status: DC | PRN

## 2017-11-02 MED ORDER — AMLODIPINE BESYLATE 10 MG PO TABS: 10.0000 mg | ORAL_TABLET | Freq: Every day | ORAL | Status: DC

## 2017-11-02 MED ORDER — SALINE SPRAY 0.65 % NA SOLN
1.0000 | NASAL | Status: DC | PRN
  Administered 2017-11-02: 1 via NASAL
  Filled 2017-11-02: qty 44

## 2017-11-02 NOTE — Discharge Summary (Signed)
[facility] NAME: Tara [name]    MR#:  [id number]  DATE OF BIRTH:  05-17-1918  DATE OF ADMISSION:  10/30/2017   ADMITTING PHYSICIAN: Harrie Foreman, MD  DATE OF DISCHARGE: 11/02/2017  PRIMARY CARE PHYSICIAN: Einar Pheasant, MD   ADMISSION DIAGNOSIS:  Inability to ambulate due to multiple joints [R26.2] Uncontrolled hypertension [I10] Elevated troponin I level [R74.8] Fall, initial encounter [W19.XXXA] Contusion of knee, unspecified laterality, initial encounter [S80.00XA] Closed fracture of ramus of left pubis, initial encounter ([place]) [S32.592A] Chest pain, unspecified type [R07.9] DISCHARGE DIAGNOSIS:  Active Problems:   Hypertensive urgency   Protein-calorie malnutrition, severe  SECONDARY DIAGNOSIS:   Past Medical History:  Diagnosis Date  . Asthma   . Diabetes mellitus ([place])   . Environmental allergies   . Hypertension    HOSPITAL COURSE:  This is a 84 year old female admitted for pelvic fracture. 1. Hypertensive urgency: Improved with increased home hypertension medication and IV hydralazine as needed.  2. Pubic ramus fracture. Pain control.  PT evaluation suggest skilled nursing facility placement.    Right knee pain.  The patient still complains of right knee pain.  Per Dr. Newman Pies, get her right knee CAT scan: no occult fracture.  3. Diabetes mellitus type 2: Hold oral hypoglycemic agents. Sliding cell insulin.  4. Both thyroidism: continue Synthroid  Dehydration.  Encourage oral fluid intake. Improved. Severe malnutrition.  Per dietitian, regular diet. DISCHARGE CONDITIONS:  Stable, discharge to SNF. CONSULTS OBTAINED:   DRUG ALLERGIES:   Allergies  Allergen Reactions  . Ace Inhibitors Other (See Comments)    Angioedema  . Hydrochlorothiazide W-Triamterene Other (See Comments)    Gout flare  . Lisinopril Swelling and Other (See Comments)  . Penicillins Swelling  .  Trichlormethiazide Other (See Comments)    Gout flare   DISCHARGE MEDICATIONS:   Allergies as of 11/02/2017      Reactions   Ace Inhibitors Other (See Comments)   Angioedema   Hydrochlorothiazide W-triamterene Other (See Comments)   Gout flare   Lisinopril Swelling, Other (See Comments)   Penicillins Swelling   Trichlormethiazide Other (See Comments)   Gout flare      Medication List    STOP taking these medications   fluticasone 50 MCG/ACT nasal spray Commonly known as:  FLONASE   furosemide 20 MG tablet Commonly known as:  LASIX     TAKE these medications   acetaminophen 325 MG tablet Commonly known as:  TYLENOL Take 650 mg by mouth every 6 (six) hours as needed for mild pain.   amLODipine 10 MG tablet Commonly known as:  NORVASC Take 1 tablet (10 mg total) by mouth daily. Start taking on:  11/03/2017 What changed:    medication strength  how much to take   aspirin 81 MG tablet Take 81 mg by mouth daily.   CALTRATE 600+D PO Take 1 tablet by mouth daily.   D3 SUPER STRENGTH 2000 units Caps Generic drug:  Cholecalciferol Take 2,000 Units by mouth daily.   fexofenadine 180 MG tablet Commonly known as:  ALLEGRA Take 1 tablet (180 mg total) by mouth daily.   glucose blood test strip Commonly known as:  ACCU-CHEK AVIVA PLUS CHECK SUGAR ONCE DAILY   hydrALAZINE 50 MG tablet Commonly known as:  APRESOLINE TAKE ONE (1) TABLET THREE (3) TIMES EACH DAY   metoprolol succinate 50 MG 24 hr tablet Commonly known as:  TOPROL-XL Take 1 tablet (50 mg total) by  mouth 2 (two) times daily. Take with or immediately following a meal. What changed:    medication strength  how much to take  additional instructions   oxyCODONE-acetaminophen 5-325 MG tablet Commonly known as:  PERCOCET/ROXICET Take 1 tablet by mouth every 6 (six) hours as needed for moderate pain.   potassium chloride 10 MEQ tablet Commonly known as:  K-DUR Take 1 tablet (10 mEq total) by mouth  daily.   SYNTHROID 50 MCG tablet Generic drug:  levothyroxine TAKE ONE (1) TABLET EACH DAY        DISCHARGE INSTRUCTIONS:  See AVS. If you experience worsening of your admission symptoms, develop shortness of breath, life threatening emergency, suicidal or homicidal thoughts you must seek medical attention immediately by calling 911 or calling your MD immediately  if symptoms less severe.  You Must read complete instructions/literature along with all the possible adverse reactions/side effects for all the Medicines you take and that have been prescribed to you. Take any new Medicines after you have completely understood and accpet all the possible adverse reactions/side effects.   Please note  You were cared for by a hospitalist during your hospital stay. If you have any questions about your discharge medications or the care you received while you were in the hospital after you are discharged, you can call the unit and asked to speak with the hospitalist on call if the hospitalist that took care of you is not available. Once you are discharged, your primary care physician will handle any further medical issues. Please note that NO REFILLS for any discharge medications will be authorized once you are discharged, as it is imperative that you return to your primary care physician (or establish a relationship with a primary care physician if you do not have one) for your aftercare needs so that they can reassess your need for medications and monitor your lab values.    On the day of Discharge:  VITAL SIGNS:  Blood pressure (!) 171/84, pulse 84, temperature 97.9 F (36.6 C), resp. rate 18, height 4\' 10"  (1.473 m), weight 84 lb 3.2 oz (38.2 kg), SpO2 93 %. PHYSICAL EXAMINATION:  GENERAL:  84 y.o.-year-old patient lying in the bed with no acute distress. Malnutrition. EYES: Pupils equal, round, reactive to light and accommodation. No scleral icterus. Extraocular muscles intact.  HEENT: Head  atraumatic, normocephalic. Oropharynx and nasopharynx clear.  NECK:  Supple, no jugular venous distention. No thyroid enlargement, no tenderness.  LUNGS: Normal breath sounds bilaterally, no wheezing, rales,rhonchi or crepitation. No use of accessory muscles of respiration.  CARDIOVASCULAR: S1, S2 normal. No murmurs, rubs, or gallops.  ABDOMEN: Soft, non-tender, non-distended. Bowel sounds present. No organomegaly or mass.  EXTREMITIES: No pedal edema, cyanosis, or clubbing.   NEUROLOGIC: Cranial nerves II through XII are intact. Muscle strength 4/5 in all extremities. Sensation intact. Gait not checked.  PSYCHIATRIC: The patient is alert and oriented x 3.  SKIN: No obvious rash, lesion, or ulcer.  DATA REVIEW:   CBC Recent Labs  Lab 10/30/17 2307  WBC 10.0  HGB 12.1  HCT 35.3  PLT 226    Chemistries  Recent Labs  Lab 11/02/17 0303  NA 139  K 4.3  CL 107  CO2 23  GLUCOSE 167*  BUN 23*  CREATININE 0.81  CALCIUM 8.4*     Microbiology Results  Results for orders placed or performed in visit on 10/07/17  Urine Culture     Status: None   Collection Time: 10/07/17 10:55 AM  Result Value Ref Range Status   MICRO NUMBER: [id number]  Final   SPECIMEN QUALITY: ADEQUATE  Final   Sample Source NOT GIVEN  Final   STATUS: FINAL  Final   Result: No Growth  Final    RADIOLOGY:  Ct Knee Right Wo Contrast  Result Date: 11/01/2017 CLINICAL DATA:  Fall at home onto knees. Right knee pain and swelling. Pelvic fractures. EXAM: CT OF THE RIGHT KNEE WITHOUT CONTRAST TECHNIQUE: Multidetector CT imaging of the right knee was performed according to the standard protocol. Multiplanar CT image reconstructions were also generated. COMPARISON:  Radiographs 10/30/2017. FINDINGS: Bones/Joint/Cartilage The bones are diffusely demineralized. There is no evidence of acute fracture or dislocation. There are minimal tricompartmental degenerative changes for age. There is a moderate to large knee joint  effusion without definite layering intra-articular fat. Ligaments Suboptimally assessed by CT. The cruciate ligaments appear grossly intact. There may be synovitis in the intercondylar notch. Muscles and Tendons The extensor mechanism is intact. There is some fatty atrophy proximally in the medial head of the gastrocnemius muscle. Soft tissues Extensive vascular calcifications. Mild subcutaneous edema surrounding the knee. No focal hematoma. IMPRESSION: 1. Osteopenia without evidence of acute fracture or dislocation. 2. Moderate to large knee joint effusion. This could be a manifestation internal derangement. 3. Grossly intact cruciate ligaments with possible synovitis in the intercondylar notch. Diffuse vascular calcifications. Electronically Signed   By: Richardean Sale M.D.   On: 11/01/2017 17:03     Management plans discussed with the patient, daughters and they are in agreement.  CODE STATUS: Full Code   TOTAL TIME TAKING CARE OF THIS PATIENT: 33 minutes.    Demetrios Loll M.D on 11/02/2017 at 1:30 PM  Between 7am to 6pm - Pager - [phone]  After 6pm go to [email or web address] - Proofreader  [facility]  Office  [phone]  CC: Primary care physician; Einar Pheasant, MD   Note: This dictation was prepared with Dragon dictation along with smaller phrase technology. Any transcriptional errors that result from this process are unintentional.

## 2017-11-02 NOTE — Clinical Social Work Note (Signed)
CSW presented bed offers to patient and her family and she chose [facility].  CSW spoke to [facility] and they can accept patient today.  Patient to be d/c'ed today to [facility], room 212.  Patient and family agreeable to plans will transport via ems RN to call report [phone].  Patient's family is at bedside and are aware of discharge.  Evette Cristal, MSW, [place]

## 2017-11-02 NOTE — Care Management (Signed)
Observation since 3/23 for pelvic fracture.  Physical therapy evaluated 3/24 and recommends SNF. Has received 2 doses IV apresoline 3/23. Systolic blood pressure over 200. Has required increase in Metoprolol from 25mg  bid to 50 mg bid. Three doses IV MS 3/23. Tramadol po x 1.  Current pain med is Tyelenol and last does 3/24. FL2 has been initiated but does not appear has been sent as was waiting on completion of physical therapy consult

## 2017-11-02 NOTE — Plan of Care (Addendum)
Discharge to [facility] .  They will handle her meds.  AVS explained to her and her family - med changes, discharge instructions - follow up.

## 2017-11-02 NOTE — Discharge Instructions (Signed)
Fall precaution. Regular diet.

## 2017-11-02 NOTE — Clinical Social Work Placement (Signed)
   CLINICAL SOCIAL WORK PLACEMENT  NOTE  Date:  11/02/2017  Patient Details  Name: Tara [name] MRN: [id number] Date of Birth: November 26, 1917  Clinical Social Work is seeking post-discharge placement for this patient at the [place] level of care (*CSW will initial, date and re-position this form in  chart as items are completed):  Yes   Patient/family provided with [place] Work Department's list of facilities offering this level of care within the geographic area requested by the patient (or if unable, by the patient's family).  Yes   Patient/family informed of their freedom to choose among providers that offer the needed level of care, that participate in Medicare, Medicaid or managed care program needed by the patient, have an available bed and are willing to accept the patient.  Yes   Patient/family informed of 's ownership interest in [facility] and [facility], as well as of the fact that they are under no obligation to receive care at these facilities.  PASRR submitted to EDS on 11/01/17     PASRR number received on 11/01/17     Existing PASRR number confirmed on       FL2 transmitted to all facilities in geographic area requested by pt/family on       FL2 transmitted to all facilities within larger geographic area on       Patient informed that his/her managed care company has contracts with or will negotiate with certain facilities, including the following:        Yes   Patient/family informed of bed offers received.  Patient chooses bed at [facility]     Physician recommends and patient chooses bed at      Patient to be transferred to [facility] on 11/02/17.  Patient to be transferred to facility by [facility]     Patient family notified on 11/02/17 of transfer.  Name of family member notified:  Daughter's Kendrick Fries and [name] were at bedside.     PHYSICIAN       Additional Comment:     _______________________________________________ Ross Ludwig, LCSWA 11/02/2017, 3:13 PM

## 2017-11-03 ENCOUNTER — Other Ambulatory Visit: Payer: Self-pay | Admitting: Gerontology

## 2017-11-03 ENCOUNTER — Non-Acute Institutional Stay (SKILLED_NURSING_FACILITY): Payer: Medicare Other | Admitting: Gerontology

## 2017-11-03 ENCOUNTER — Encounter: Payer: Self-pay | Admitting: *Deleted

## 2017-11-03 ENCOUNTER — Other Ambulatory Visit
Admission: RE | Admit: 2017-11-03 | Discharge: 2017-11-03 | Disposition: A | Discharge status: Home / Self Care | Payer: Medicare Other | Source: Skilled Nursing Facility | Attending: Internal Medicine | Admitting: Internal Medicine

## 2017-11-03 DIAGNOSIS — Z9181 History of falling: Secondary | ICD-10-CM | POA: Diagnosis not present

## 2017-11-03 DIAGNOSIS — R0602 Shortness of breath: Secondary | ICD-10-CM | POA: Insufficient documentation

## 2017-11-03 DIAGNOSIS — R0689 Other abnormalities of breathing: Secondary | ICD-10-CM | POA: Insufficient documentation

## 2017-11-03 DIAGNOSIS — S32512D Fracture of superior rim of left pubis, subsequent encounter for fracture with routine healing: Secondary | ICD-10-CM

## 2017-11-03 DIAGNOSIS — M25461 Effusion, right knee: Secondary | ICD-10-CM | POA: Diagnosis not present

## 2017-11-03 DIAGNOSIS — R109 Unspecified abdominal pain: Secondary | ICD-10-CM | POA: Insufficient documentation

## 2017-11-03 LAB — CBC WITH DIFFERENTIAL/PLATELET
BASOS PCT: 1 %
Basophils Absolute: 0 10*3/uL (ref 0–0.1)
EOS ABS: 0.1 10*3/uL (ref 0–0.7)
Eosinophils Relative: 1 %
HEMATOCRIT: 32.5 % — AB (ref 35.0–47.0)
Hemoglobin: 10.8 g/dL — ABNORMAL LOW (ref 12.0–16.0)
Lymphocytes Relative: 16 %
Lymphs Abs: 1.2 10*3/uL (ref 1.0–3.6)
MCH: 31 pg (ref 26.0–34.0)
MCHC: 33.3 g/dL (ref 32.0–36.0)
MCV: 92.9 fL (ref 80.0–100.0)
MONO ABS: 0.7 10*3/uL (ref 0.2–0.9)
MONOS PCT: 9 %
NEUTROS ABS: 5.6 10*3/uL (ref 1.4–6.5)
Neutrophils Relative %: 73 %
PLATELETS: 195 10*3/uL (ref 150–440)
RBC: 3.5 MIL/uL — ABNORMAL LOW (ref 3.80–5.20)
RDW: 15.3 % — AB (ref 11.5–14.5)
WBC: 7.5 10*3/uL (ref 3.6–11.0)

## 2017-11-03 LAB — COMPREHENSIVE METABOLIC PANEL
ALBUMIN: 2.5 g/dL — AB (ref 3.5–5.0)
ALT: 14 U/L (ref 14–54)
ANION GAP: 9 (ref 5–15)
AST: 23 U/L (ref 15–41)
Alkaline Phosphatase: 46 U/L (ref 38–126)
BUN: 27 mg/dL — AB (ref 6–20)
CHLORIDE: 106 mmol/L (ref 101–111)
CO2: 22 mmol/L (ref 22–32)
Calcium: 8.1 mg/dL — ABNORMAL LOW (ref 8.9–10.3)
Creatinine, Ser: 0.86 mg/dL (ref 0.44–1.00)
GFR calc Af Amer: >60 mL/min (ref >60)
GFR calc non Af Amer: 55 mL/min — ABNORMAL LOW (ref >60)
GLUCOSE: 212 mg/dL — AB (ref 65–99)
POTASSIUM: 4.3 mmol/L (ref 3.5–5.1)
SODIUM: 137 mmol/L (ref 135–145)
TOTAL PROTEIN: 6.6 g/dL (ref 6.5–8.1)
Total Bilirubin: 0.6 mg/dL (ref 0.3–1.2)

## 2017-11-03 LAB — FIBRIN DERIVATIVES D-DIMER (ARMC ONLY): Fibrin derivatives D-dimer (ARMC): 4184.96 ng/mL (FEU) — ABNORMAL HIGH (ref 0.00–499.00)

## 2017-11-03 NOTE — Progress Notes (Signed)
Location:      Place of Service:  Nursing [phone]) Provider:  Toni Arthurs, NP-C  Tara Pheasant, MD  [name], MD as PCP - General (Internal Medicine)  Extended Emergency Contact Information Primary Emergency Contact: Tara [name] Address: [address]          [place], [name] Tara [name] of [place] Phone: [phone] Mobile Phone: [phone] Relation: Daughter Secondary Emergency Contact: Tara [name] Address: [address] [place]          [place], [name] Home Phone: [phone] Work Phone: [phone] Relation: Daughter  Code Status: Full Goals of care: Advanced Directive information Advanced Directives 10/31/2017  Does Patient Have a Medical Advance Directive? No  Would patient like information on creating a medical advance directive? No - Patient declined     Chief Complaint  Patient presents with  . Acute Visit    difficulty breathing     HPI:  Pt is a 84 y.o. female seen today for an acute visit for difficulty breathing.  Was notified by nursing this morning with concerns that the patient appears to be having some increased difficulty/labored breathing.  Nursing reports O2 sat is 91-92% on room air.  Prior to my arrival, nursing assessment done.  Nursing heard some adventitious breath sounds in the bilateral lower lobes.  Patient was also reporting significant leg pain.  Patient also had a temperature this morning of 100.2, after Tylenol was given.  Nursing reports patient came from the hospital with a fever noted to be 102.4 degrees on admission to the facility.  Patient denies cough or congestion.  She reports she is voiding well and last had a BM yesterday prior to leaving the hospital.  Patient says she has "asthma" and sometimes has trouble catching her breath.  She reports she is feeling better since she use the saline nasal spray.  However, on assessment, patient does still appear to be having some labored  breathing.  Due to this, and the fever, patient and family are agreeable to have the x-rays done.  Patient reports bilateral leg pain, right greater than left.  Patient was found to have a right knee effusion, found by CT scan while in the hospital.  The right knee does have edema and warmth.  The edema is fluctuant.  The right upper, medial thigh is warm to touch, tender, and more edematous than the left.  Bilateral pedal pulses weak.  Patient is not on DVT prophylaxis, other than aspirin 81 mg daily.  She is a significant fall risk, weak and debilitated with history of protein calorie malnutrition.  Will assess labs for degree of malnutrition, hemoglobin, elevated WBC, electrolytes, etc. Doppler to rule out DVT, abdominal x-ray to rule out ileus/constipation due to the fever. Pt and family agreeable with the plan. Will follow up. No other complaints.     Past Medical History:  Diagnosis Date  . Asthma   . Diabetes mellitus ([place])   . Environmental allergies   . Hypertension    Past Surgical History:  Procedure Laterality Date  . APPENDECTOMY    . TUBAL LIGATION      Allergies  Allergen Reactions  . Ace Inhibitors Other (See Comments)    Angioedema  . Hydrochlorothiazide W-Triamterene Other (See Comments)    Gout flare  . Lisinopril Swelling and Other (See Comments)  . Penicillins Swelling  . Trichlormethiazide Other (See Comments)    Gout flare    Allergies as of 11/03/2017  Reactions   Ace Inhibitors Other (See Comments)   Angioedema   Hydrochlorothiazide W-triamterene Other (See Comments)   Gout flare   Lisinopril Swelling, Other (See Comments)   Penicillins Swelling   Trichlormethiazide Other (See Comments)   Gout flare      Medication List        Accurate as of 11/03/17 12:09 PM. Always use your most recent med list.          acetaminophen 325 MG tablet Commonly known as:  TYLENOL Take 650 mg by mouth every 6 (six) hours as needed for mild pain.     amLODipine 10 MG tablet Commonly known as:  NORVASC Take 1 tablet (10 mg total) by mouth daily.   aspirin 81 MG tablet Take 81 mg by mouth daily.   CALTRATE 600+D PO Take 1 tablet by mouth daily.   D3 SUPER STRENGTH 2000 units Caps Generic drug:  Cholecalciferol Take 2,000 Units by mouth daily.   fexofenadine 180 MG tablet Commonly known as:  ALLEGRA Take 1 tablet (180 mg total) by mouth daily.   glucose blood test strip Commonly known as:  ACCU-CHEK AVIVA PLUS CHECK SUGAR ONCE DAILY   hydrALAZINE 50 MG tablet Commonly known as:  APRESOLINE TAKE ONE (1) TABLET THREE (3) TIMES EACH DAY   metoprolol succinate 50 MG 24 hr tablet Commonly known as:  TOPROL-XL Take 1 tablet (50 mg total) by mouth 2 (two) times daily. Take with or immediately following a meal.   oxyCODONE-acetaminophen 5-325 MG tablet Commonly known as:  PERCOCET/ROXICET Take 1 tablet by mouth every 6 (six) hours as needed for moderate pain.   potassium chloride 10 MEQ tablet Commonly known as:  K-DUR Take 1 tablet (10 mEq total) by mouth daily.   SYNTHROID 50 MCG tablet Generic drug:  levothyroxine TAKE ONE (1) TABLET EACH DAY       Review of Systems  Constitutional: Positive for fever. Negative for activity change, appetite change, chills and diaphoresis.  HENT: Negative for congestion, mouth sores, nosebleeds, postnasal drip, rhinorrhea, sinus pressure, sinus pain, sneezing, sore throat, trouble swallowing and voice change.   Respiratory: Negative for apnea, cough, choking, chest tightness, shortness of breath (denies, but obviously is short of br.) and wheezing.   Cardiovascular: Positive for leg swelling. Negative for chest pain and palpitations.  Gastrointestinal: Negative for abdominal distention, abdominal pain, constipation, diarrhea and nausea.  Genitourinary: Negative for difficulty urinating, dysuria, frequency and urgency.  Musculoskeletal: Positive for arthralgias (typical arthritis),  gait problem and joint swelling. Negative for back pain and myalgias.  Skin: Positive for wound. Negative for color change, pallor and rash.  Neurological: Positive for weakness. Negative for dizziness, tremors, syncope, speech difficulty, numbness and headaches.  Psychiatric/Behavioral: Negative for agitation and behavioral problems.  All other systems reviewed and are negative.   Immunization History  Administered Date(s) Administered  . Influenza, High Dose Seasonal PF 05/20/2016, 05/20/2017  . Influenza,inj,Quad PF,6+ Mos 05/12/2013, 04/26/2014, 04/10/2015  . Pneumococcal Conjugate-13 09/12/2014  . Pneumococcal Polysaccharide-23 03/10/2017   Pertinent  Health Maintenance Due  Topic Date Due  . FOOT EXAM  02/11/1928  . HEMOGLOBIN A1C  05/03/2018  . OPHTHALMOLOGY EXAM  05/06/2018  . INFLUENZA VACCINE  Completed  . DEXA SCAN  Completed  . PNA vac Low Risk Adult  Completed   Fall Risk  08/10/2017 11/06/2016 06/19/2016 05/20/2016 06/23/2014  Falls in the past year? No No Yes No Yes  Number falls in past yr: - - 1 - 2 or  more  Injury with Fall? - - No - -  Risk Factor Category  - - - - -  Risk for fall due to : - - - - -   Functional Status Survey:    Vitals:   11/03/17 0900  BP: (!) 157/56  Pulse: 88  Resp: (!) 22  Temp: 100.2 F (37.9 C)  SpO2: 93%  Weight: 90 lb 11.2 oz (41.1 kg)   Body mass index is 18.96 kg/m. Physical Exam  Constitutional: She is oriented to person, place, and time. Vital signs are normal. She appears well-developed and well-nourished. She is active and cooperative. She does not appear ill. No distress.  HENT:  Head: Normocephalic and atraumatic.  Mouth/Throat: Uvula is midline, oropharynx is clear and moist and mucous membranes are normal. Mucous membranes are not pale, not dry and not cyanotic.  Eyes: Pupils are equal, round, and reactive to light. Conjunctivae, EOM and lids are normal.  Neck: Trachea normal, normal range of motion and full  passive range of motion without pain. Neck supple. No JVD present. No tracheal deviation, no edema and no erythema present. No thyromegaly present.  Cardiovascular: Normal rate, regular rhythm, normal heart sounds, intact distal pulses and normal pulses. Exam reveals no gallop, no distant heart sounds and no friction rub.  No murmur heard. Pulses:      Dorsalis pedis pulses are 2+ on the right side, and 2+ on the left side.  No edema  Pulmonary/Chest: Accessory muscle usage present. Tachypnea (mild) noted. No respiratory distress. She has decreased breath sounds in the right lower field and the left lower field. She has no wheezes. She has no rhonchi. She has no rales. She exhibits no tenderness.  Abdominal: Soft. Normal appearance and bowel sounds are normal. She exhibits no distension and no ascites. There is no tenderness.  Musculoskeletal: Normal range of motion. She exhibits no edema or tenderness.  Expected osteoarthritis, stiffness; Bilateral Calves soft, supple. Negative Homan's Sign. B- pedal pulses equal pubic rami fracture, generalized weakness  Neurological: She is alert and oriented to person, place, and time. She has normal strength. She displays atrophy. She exhibits abnormal muscle tone. Coordination and gait abnormal.  Skin: Skin is warm and dry. Abrasion noted. She is not diaphoretic. No cyanosis. No pallor. Nails show no clubbing.     Psychiatric: She has a normal mood and affect. Her speech is normal and behavior is normal. Judgment and thought content normal. Cognition and memory are normal.  Nursing note and vitals reviewed.   Labs reviewed: Recent Labs    09/30/17 1410 10/30/17 2307 11/02/17 0303  NA 140 140 139  K 4.5 4.2 4.3  CL 106 108 107  CO2 _0 GLUCOSE 198* 192* 167*  BUN 31* 32* 23*  CREATININE 0.94 0.83 0.81  CALCIUM 9.1 9.1 8.4*   Recent Labs    03/10/17 1446 06/26/17 2122 08/10/17 1032  AST _1 ALT _2 ALKPHOS 34* 47  --    BILITOT 0.3 0.6 0.3  PROT 7.1 8.2* 6.9  ALBUMIN 3.8 3.9  --    Recent Labs    03/10/17 1446 06/26/17 2122 10/30/17 2307  WBC 6.3 5.6 10.0  NEUTROABS 2.7  --   --   HGB 11.6* 12.2 12.1  HCT 35.9* 37.4 35.3  MCV 95.5 92.9 92.1  PLT 230.0 236 226   Lab Results  Component Value Date   TSH 1.765 10/31/2017   Lab Results  Component Value Date   HGBA1C 6.4 (H) 10/31/2017   Lab Results  Component Value Date   CHOL 209 (H) 06/21/2014   HDL 77.00 06/21/2014   LDLCALC 111 (H) 06/21/2014   LDLDIRECT 107.1 07/29/2013   TRIG 105.0 06/21/2014   CHOLHDL 3 06/21/2014    Significant Diagnostic Results in last 30 days:  Ct Knee Right Wo Contrast  Result Date: 11/01/2017 CLINICAL DATA:  Fall at home onto knees. Right knee pain and swelling. Pelvic fractures. EXAM: CT OF THE RIGHT KNEE WITHOUT CONTRAST TECHNIQUE: Multidetector CT imaging of the right knee was performed according to the standard protocol. Multiplanar CT image reconstructions were also generated. COMPARISON:  Radiographs 10/30/2017. FINDINGS: Bones/Joint/Cartilage The bones are diffusely demineralized. There is no evidence of acute fracture or dislocation. There are minimal tricompartmental degenerative changes for age. There is a moderate to large knee joint effusion without definite layering intra-articular fat. Ligaments Suboptimally assessed by CT. The cruciate ligaments appear grossly intact. There may be synovitis in the intercondylar notch. Muscles and Tendons The extensor mechanism is intact. There is some fatty atrophy proximally in the medial head of the gastrocnemius muscle. Soft tissues Extensive vascular calcifications. Mild subcutaneous edema surrounding the knee. No focal hematoma. IMPRESSION: 1. Osteopenia without evidence of acute fracture or dislocation. 2. Moderate to large knee joint effusion. This could be a manifestation internal derangement. 3. Grossly intact cruciate ligaments with possible synovitis in the  intercondylar notch. Diffuse vascular calcifications. Electronically Signed   By: Richardean Sale M.D.   On: 11/01/2017 17:03   Dg Chest Port 1 View  Result Date: 10/30/2017 CLINICAL DATA:  Chest pain. EXAM: PORTABLE CHEST 1 VIEW COMPARISON:  07/06/2014 FINDINGS: Unchanged cardiomegaly with tortuous atherosclerotic thoracic aorta. No pulmonary edema. No consolidation, pleural effusion or pneumothorax. Degenerative change in both shoulders. IMPRESSION: Chronic cardiomegaly and tortuous atherosclerotic thoracic aorta. No acute abnormality. Electronically Signed   By: Jeb Levering M.D.   On: 10/30/2017 23:11   Dg Knee Complete 4 Views Left  Result Date: 10/30/2017 CLINICAL DATA:  Fall with knee pain EXAM: LEFT KNEE - COMPLETE 4+ VIEW COMPARISON:  12/29/2013 FINDINGS: No acute displaced fracture or malalignment. Mild patellofemoral and medial joint space degenerative change. Trace knee effusion. Vascular calcification. IMPRESSION: Mild degenerative changes. Trace knee effusion. No acute fracture or malalignment Electronically Signed   By: Donavan Foil M.D.   On: 10/30/2017 20:05   Dg Knee Complete 4 Views Right  Result Date: 10/30/2017 CLINICAL DATA:  Fall with knee pain EXAM: RIGHT KNEE - COMPLETE 4+ VIEW COMPARISON:  None. FINDINGS: No acute displaced fracture or malalignment. Mild patellofemoral and medial joint space degenerative change. Vascular calcification. No large knee effusion. IMPRESSION: No acute osseous abnormality Electronically Signed   By: Donavan Foil M.D.   On: 10/30/2017 20:06   Dg Hips Bilat W Or Wo Pelvis 3-4 Views  Result Date: 10/31/2017 CLINICAL DATA:  Pain after fall. Bilateral hip pain. Cannot support weight. Right worse than left. EXAM: DG HIP (WITH OR WITHOUT PELVIS) 3-4V BILAT COMPARISON:  Reformats from abdominal CT 06/26/2017 FINDINGS: Right inferior pubic ramus fracture is remote in unchanged from prior CT. No evidence of acute fracture. Femoral head remains seated.  Fracture of the left superior pubic ramus is new from prior exam, minimally displaced. Probable nondisplaced left inferior pubic ramus fracture. Femoral head remains seated. Pubic symphysis and sacroiliac joints remain congruent. Advanced vascular calcifications. IMPRESSION: 1. Acute left superior pubic ramus fracture, minimally displaced, suspect nondisplaced left inferior ramus  fracture. 2. Remote right inferior pubic ramus fracture. No acute fracture of the right hip. Electronically Signed   By: Jeb Levering M.D.   On: 10/31/2017 05:03    Assessment/Plan Tara [name] was seen today for acute visit.  Diagnoses and all orders for this visit:  Difficulty breathing  Knee effusion, right  Fracture of superior rim of left pubis, subsequent encounter for fracture with routine healing  History of falling    Continue PT/OT  Continue exercises as taught by PT/OT  2 view chest x-ray  Abdomen and pelvic x-ray  Bilateral lower extremity venous Doppler to rule out DVT for leg pain  Voltaren 1% gel-4 g 4 times daily to the right knee  Duo nebs every 4 hours as needed, dyspnea  Continue Percocet 5/325 mg 1 tablet p.o. every 6 hours as needed pain  Saline nasal spray- 2 sprays to each nostril as needed for nasal congestion.  Okay to leave at bedside  O2 2 L nasal cannula for  Family/ staff Communication:   Total Time:  Documentation:  Face to Face:  Family/Phone: Family present at bedside   Labs/tests ordered: CBC, met C, 2 view chest x-ray, abdomen/pelvis x-ray, bilateral lower extremity venous Doppler  Medication list reviewed and assessed for continued appropriateness.  Vikki Ports, NP-C Geriatrics [facility] [phone], [place] [address] Cell Phone (Mon-Fri 8am-5pm):  [phone] On Call:  [phone] & follow prompts after 5pm & weekends Office Phone:  [phone] Office Fax:  [phone]

## 2017-11-04 DIAGNOSIS — E1151 Type 2 diabetes mellitus with diabetic peripheral angiopathy without gangrene: Secondary | ICD-10-CM | POA: Diagnosis not present

## 2017-11-04 DIAGNOSIS — M8000XD Age-related osteoporosis with current pathological fracture, unspecified site, subsequent encounter for fracture with routine healing: Secondary | ICD-10-CM | POA: Diagnosis not present

## 2017-11-04 DIAGNOSIS — I10 Essential (primary) hypertension: Secondary | ICD-10-CM | POA: Diagnosis not present

## 2017-11-04 DIAGNOSIS — G9340 Encephalopathy, unspecified: Secondary | ICD-10-CM | POA: Diagnosis not present

## 2017-11-09 ENCOUNTER — Ambulatory Visit: Payer: Self-pay

## 2017-11-09 ENCOUNTER — Encounter
Admission: RE | Admit: 2017-11-09 | Discharge: 2017-11-09 | Disposition: A | Discharge status: Home / Self Care | Payer: Medicare Other | Source: Ambulatory Visit | Attending: Internal Medicine | Admitting: Internal Medicine

## 2017-11-12 ENCOUNTER — Non-Acute Institutional Stay (SKILLED_NURSING_FACILITY): Payer: Medicare Other | Admitting: Gerontology

## 2017-11-12 ENCOUNTER — Encounter: Payer: Self-pay | Admitting: Gerontology

## 2017-11-12 DIAGNOSIS — Z9181 History of falling: Secondary | ICD-10-CM | POA: Diagnosis not present

## 2017-11-12 DIAGNOSIS — S32512D Fracture of superior rim of left pubis, subsequent encounter for fracture with routine healing: Secondary | ICD-10-CM | POA: Diagnosis not present

## 2017-11-12 DIAGNOSIS — J189 Pneumonia, unspecified organism: Secondary | ICD-10-CM | POA: Diagnosis not present

## 2017-11-12 DIAGNOSIS — M25461 Effusion, right knee: Secondary | ICD-10-CM | POA: Diagnosis not present

## 2017-11-12 DIAGNOSIS — E43 Unspecified severe protein-calorie malnutrition: Secondary | ICD-10-CM | POA: Diagnosis not present

## 2017-11-12 DIAGNOSIS — Z8719 Personal history of other diseases of the digestive system: Secondary | ICD-10-CM

## 2017-11-12 NOTE — Progress Notes (Signed)
Location:  [facility] Room Number: 212A Place of Service:  SNF [phone]) Provider:  Toni Arthurs, NP-C  Einar Pheasant, MD  Patient Care Team: Einar Pheasant, MD as PCP - General (Internal Medicine)  Extended Emergency Contact Information Primary Emergency Contact: [facility] Address: [address]          [place], [place] [address] Johnnette Litter of [place] Phone: [phone] Mobile Phone: [phone] Relation: Daughter Secondary Emergency Contact: [name] Address: [address] [place]          [place], [place] [address] Home Phone: [phone] Work Phone: [phone] Relation: Daughter  Code Status:  FULL Goals of care: Advanced Directive information Advanced Directives 11/12/2017  Does Patient Have a Medical Advance Directive? No  Would patient like information on creating a medical advance directive? No - Patient declined     Chief Complaint  Patient presents with  . Medical Management of Chronic Issues    Routine Visit    HPI:  Pt is a 84 y.o. female seen today for medical management of chronic diseases. Pt was admitted to the facility for rehab following hospitalization at [facility] for mechanical fall with fracture of the superior rim of left pubis. The day after admission, pt c/o some difficulty breathing and fever. She was found to have PNA and an ileus. She has place on clear liquid diet, IV fluids, IV reglan for several days with resolution of symptoms. She was also placed on TID scheduled and prn Duonebs with Levaquin 500 mg IV x 5 days. She c/o right knee pain and edema. Found to have a knee effusion. She was placed on Voltaren Gel QID and ice prn. Pt reports improvement of symptoms. Labs showed protein-calorie malnutrition. She was given po supplements. Today, pt reports she is feeling well. Only c/o nasal congestion, but has prn saline nasal spray available and prn Allegra. Otherwise, pt is participating in PT/OT. Pt is progressing  well. Pt reports her pain is well controlled on current regimen. Appetite is good, voiding well and having regular BMs now. VSS. No other complaints.      Past Medical History:  Diagnosis Date  . Asthma   . Diabetes mellitus ([place])   . Environmental allergies   . Hypertension    Past Surgical History:  Procedure Laterality Date  . APPENDECTOMY    . TUBAL LIGATION      Allergies  Allergen Reactions  . Ace Inhibitors Other (See Comments)    Angioedema  . Hydrochlorothiazide W-Triamterene Other (See Comments)    Gout flare  . Lisinopril Swelling and Other (See Comments)  . Penicillins Swelling  . Trichlormethiazide Other (See Comments)    Gout flare    Allergies as of 11/12/2017      Reactions   Ace Inhibitors Other (See Comments)   Angioedema   Hydrochlorothiazide W-triamterene Other (See Comments)   Gout flare   Lisinopril Swelling, Other (See Comments)   Penicillins Swelling   Trichlormethiazide Other (See Comments)   Gout flare      Medication List        Accurate as of 11/12/17  1:18 PM. Always use your most recent med list.          acetaminophen 325 MG tablet Commonly known as:  TYLENOL Take 650 mg by mouth every 6 (six) hours as needed for mild pain.   amLODipine 10 MG tablet Commonly known as:  NORVASC Take 1 tablet (10 mg total) by mouth daily.  aspirin 325 MG EC tablet Take 325 mg by mouth 2 (two) times daily.   CALTRATE 600+D PO Take 1 tablet by mouth daily.   cholecalciferol 1000 units tablet Commonly known as:  VITAMIN D Take 2,000 Units by mouth daily. 2 tabs   diclofenac sodium 1 % Gel Commonly known as:  VOLTAREN Apply 4 g topically 4 (four) times daily. Apply 4 grams to the Right Knee for effusion   ENSURE ENLIVE PO Take 1 Bottle by mouth 2 (two) times daily between meals.   feeding supplement (PRO-STAT SUGAR FREE 64) Liqd Take 30 mLs by mouth 2 (two) times daily between meals.   fexofenadine 180 MG tablet Commonly known as:   ALLEGRA Take 1 tablet (180 mg total) by mouth daily.   glucose blood test strip Commonly known as:  ACCU-CHEK AVIVA PLUS CHECK SUGAR ONCE DAILY   hydrALAZINE 50 MG tablet Commonly known as:  APRESOLINE TAKE ONE (1) TABLET THREE (3) TIMES EACH DAY   metoprolol succinate 50 MG 24 hr tablet Commonly known as:  TOPROL-XL Take 1 tablet (50 mg total) by mouth 2 (two) times daily. Take with or immediately following a meal.   oxyCODONE-acetaminophen 5-325 MG tablet Commonly known as:  PERCOCET/ROXICET Take 1 tablet by mouth every 6 (six) hours as needed for moderate pain.   sennosides-docusate sodium 8.6-50 MG tablet Commonly known as:  SENOKOT-S Take 2 tablets by mouth 2 (two) times daily.   SYNTHROID 50 MCG tablet Generic drug:  levothyroxine TAKE ONE (1) TABLET EACH DAY       Review of Systems  Constitutional: Negative for activity change, appetite change, chills, diaphoresis and fever.  HENT: Negative for congestion, mouth sores, nosebleeds, postnasal drip, sneezing, sore throat, trouble swallowing and voice change.   Respiratory: Negative for apnea, cough, choking, chest tightness, shortness of breath and wheezing.   Cardiovascular: Negative for chest pain, palpitations and leg swelling.  Gastrointestinal: Negative for abdominal distention, abdominal pain, constipation, diarrhea and nausea.  Genitourinary: Negative for difficulty urinating, dysuria, frequency and urgency.  Musculoskeletal: Positive for arthralgias (typical arthritis) and joint swelling. Negative for back pain, gait problem and myalgias.  Skin: Negative for color change, pallor, rash and wound.  Neurological: Positive for weakness. Negative for dizziness, tremors, syncope, speech difficulty, numbness and headaches.  Psychiatric/Behavioral: Negative for agitation and behavioral problems.  All other systems reviewed and are negative.   Immunization History  Administered Date(s) Administered  . Influenza, High  Dose Seasonal PF 05/20/2016, 05/20/2017  . Influenza,inj,Quad PF,6+ Mos 05/12/2013, 04/26/2014, 04/10/2015  . Pneumococcal Conjugate-13 09/12/2014  . Pneumococcal Polysaccharide-23 03/10/2017   Pertinent  Health Maintenance Due  Topic Date Due  . FOOT EXAM  02/11/1928  . INFLUENZA VACCINE  03/11/2018  . HEMOGLOBIN A1C  05/03/2018  . OPHTHALMOLOGY EXAM  05/06/2018  . DEXA SCAN  Completed  . PNA vac Low Risk Adult  Completed   Fall Risk  08/10/2017 11/06/2016 06/19/2016 05/20/2016 06/23/2014  Falls in the past year? No No Yes No Yes  Number falls in past yr: - - 1 - 2 or more  Injury with Fall? - - No - -  Risk Factor Category  - - - - -  Risk for fall due to : - - - - -   Functional Status Survey:    Vitals:   11/12/17 1312  BP: (!) 171/66  Pulse: 76  Resp: 18  Temp: 98.3 F (36.8 C)  TempSrc: Oral  SpO2: 98%  Weight: 96 lb 9.6  oz (43.8 kg)  Height: 4\' 10"  (1.473 m)   Body mass index is 20.19 kg/m. Physical Exam  Constitutional: She is oriented to person, place, and time. Vital signs are normal. She appears well-developed and well-nourished. She is active and cooperative. She does not appear ill. No distress.  HENT:  Head: Normocephalic and atraumatic.  Mouth/Throat: Uvula is midline, oropharynx is clear and moist and mucous membranes are normal. Mucous membranes are not pale, not dry and not cyanotic.  Eyes: Pupils are equal, round, and reactive to light. Conjunctivae, EOM and lids are normal.  Neck: Trachea normal, normal range of motion and full passive range of motion without pain. Neck supple. No JVD present. No tracheal deviation, no edema and no erythema present. No thyromegaly present.  Cardiovascular: Normal rate, normal heart sounds, intact distal pulses and normal pulses. An irregular rhythm present. Exam reveals no gallop, no distant heart sounds and no friction rub.  No murmur heard. Pulses:      Dorsalis pedis pulses are 2+ on the right side, and 2+ on the  left side.  Mild BLE non-pitting edema  Pulmonary/Chest: Effort normal. No accessory muscle usage. No respiratory distress. She has decreased breath sounds in the right lower field and the left lower field. She has no wheezes. She has no rhonchi. She has no rales. She exhibits no tenderness.  Abdominal: Soft. Normal appearance and bowel sounds are normal. She exhibits no distension and no ascites. There is no tenderness.  Musculoskeletal: Normal range of motion. She exhibits no edema or tenderness.  Expected osteoarthritis, stiffness; Bilateral Calves soft, supple. Negative Homan's Sign. B- pedal pulses equal; generalized weakness  Neurological: She is alert and oriented to person, place, and time. She has normal strength. Coordination and gait abnormal.  Skin: Skin is warm, dry and intact. She is not diaphoretic. No cyanosis. No pallor. Nails show no clubbing.  Psychiatric: She has a normal mood and affect. Her speech is normal and behavior is normal. Judgment and thought content normal. Cognition and memory are normal.  Nursing note and vitals reviewed.   Labs reviewed: Recent Labs    10/30/17 2307 11/02/17 0303 11/03/17 1115  NA 140 139 137  K 4.2 4.3 4.3  CL 108 107 106  CO2 24 23 22   GLUCOSE 192* 167* 212*  BUN 32* 23* 27*  CREATININE 0.83 0.81 0.86  CALCIUM 9.1 8.4* 8.1*   Recent Labs    03/10/17 1446 06/26/17 2122 08/10/17 1032 11/03/17 1115  AST 19 30 24 23   ALT 12 18 17 14   ALKPHOS 34* 47  --  46  BILITOT 0.3 0.6 0.3 0.6  PROT 7.1 8.2* 6.9 6.6  ALBUMIN 3.8 3.9  --  2.5*   Recent Labs    03/10/17 1446 06/26/17 2122 10/30/17 2307 11/03/17 1115  WBC 6.3 5.6 10.0 7.5  NEUTROABS 2.7  --   --  5.6  HGB 11.6* 12.2 12.1 10.8*  HCT 35.9* 37.4 35.3 32.5*  MCV 95.5 92.9 92.1 92.9  PLT 230.0 236 226 195   Lab Results  Component Value Date   TSH 1.765 10/31/2017   Lab Results  Component Value Date   HGBA1C 6.4 (H) 10/31/2017   Lab Results  Component Value  Date   CHOL 209 (H) 06/21/2014   HDL 77.00 06/21/2014   LDLCALC 111 (H) 06/21/2014   LDLDIRECT 107.1 07/29/2013   TRIG 105.0 06/21/2014   CHOLHDL 3 06/21/2014    Significant Diagnostic Results in last 30 days:  Ct Knee Right Wo Contrast  Result Date: 11/01/2017 CLINICAL DATA:  Fall at home onto knees. Right knee pain and swelling. Pelvic fractures. EXAM: CT OF THE RIGHT KNEE WITHOUT CONTRAST TECHNIQUE: Multidetector CT imaging of the right knee was performed according to the standard protocol. Multiplanar CT image reconstructions were also generated. COMPARISON:  Radiographs 10/30/2017. FINDINGS: Bones/Joint/Cartilage The bones are diffusely demineralized. There is no evidence of acute fracture or dislocation. There are minimal tricompartmental degenerative changes for age. There is a moderate to large knee joint effusion without definite layering intra-articular fat. Ligaments Suboptimally assessed by CT. The cruciate ligaments appear grossly intact. There may be synovitis in the intercondylar notch. Muscles and Tendons The extensor mechanism is intact. There is some fatty atrophy proximally in the medial head of the gastrocnemius muscle. Soft tissues Extensive vascular calcifications. Mild subcutaneous edema surrounding the knee. No focal hematoma. IMPRESSION: 1. Osteopenia without evidence of acute fracture or dislocation. 2. Moderate to large knee joint effusion. This could be a manifestation internal derangement. 3. Grossly intact cruciate ligaments with possible synovitis in the intercondylar notch. Diffuse vascular calcifications. Electronically Signed   By: Richardean Sale M.D.   On: 11/01/2017 17:03   Dg Chest Port 1 View  Result Date: 10/30/2017 CLINICAL DATA:  Chest pain. EXAM: PORTABLE CHEST 1 VIEW COMPARISON:  07/06/2014 FINDINGS: Unchanged cardiomegaly with tortuous atherosclerotic thoracic aorta. No pulmonary edema. No consolidation, pleural effusion or pneumothorax. Degenerative  change in both shoulders. IMPRESSION: Chronic cardiomegaly and tortuous atherosclerotic thoracic aorta. No acute abnormality. Electronically Signed   By: Jeb Levering M.D.   On: 10/30/2017 23:11   Dg Knee Complete 4 Views Left  Result Date: 10/30/2017 CLINICAL DATA:  Fall with knee pain EXAM: LEFT KNEE - COMPLETE 4+ VIEW COMPARISON:  12/29/2013 FINDINGS: No acute displaced fracture or malalignment. Mild patellofemoral and medial joint space degenerative change. Trace knee effusion. Vascular calcification. IMPRESSION: Mild degenerative changes. Trace knee effusion. No acute fracture or malalignment Electronically Signed   By: Donavan Foil M.D.   On: 10/30/2017 20:05   Dg Knee Complete 4 Views Right  Result Date: 10/30/2017 CLINICAL DATA:  Fall with knee pain EXAM: RIGHT KNEE - COMPLETE 4+ VIEW COMPARISON:  None. FINDINGS: No acute displaced fracture or malalignment. Mild patellofemoral and medial joint space degenerative change. Vascular calcification. No large knee effusion. IMPRESSION: No acute osseous abnormality Electronically Signed   By: Donavan Foil M.D.   On: 10/30/2017 20:06   Dg Hips Bilat W Or Wo Pelvis 3-4 Views  Result Date: 10/31/2017 CLINICAL DATA:  Pain after fall. Bilateral hip pain. Cannot support weight. Right worse than left. EXAM: DG HIP (WITH OR WITHOUT PELVIS) 3-4V BILAT COMPARISON:  Reformats from abdominal CT 06/26/2017 FINDINGS: Right inferior pubic ramus fracture is remote in unchanged from prior CT. No evidence of acute fracture. Femoral head remains seated. Fracture of the left superior pubic ramus is new from prior exam, minimally displaced. Probable nondisplaced left inferior pubic ramus fracture. Femoral head remains seated. Pubic symphysis and sacroiliac joints remain congruent. Advanced vascular calcifications. IMPRESSION: 1. Acute left superior pubic ramus fracture, minimally displaced, suspect nondisplaced left inferior ramus fracture. 2. Remote right inferior  pubic ramus fracture. No acute fracture of the right hip. Electronically Signed   By: Jeb Levering M.D.   On: 10/31/2017 05:03    Assessment/Plan  Fracture of superior rim of left pubis, subsequent encounter for fracture with routine healing  Knee effusion, right  History of falling  Continue working with  PT/OT  Continue exercises as taught by PT/OT  Continue ice to the knee prn  Continue Voltaren Gel 1%- 4 grams to the right knee QID for pain  Light compression with acewrap to the knee if needed  Ambulate with assistance/ rolling walker  Continue Tylenol 650 mg po Q 6 hours prn pain  Continue Percocet 5/325 mg 1 tablet po Q 6 hours prn pain   Protein-calorie malnutrition, severe  Continue Pro-stat 30 mL po BID  Continue Ensure Enlive 1 bottle po BID  History of ileus  Resolved  Resume Regular diet  Monitor bowel habits  Pneumonia due to infectious organism, unspecified laterality, unspecified part of lung  Resolved  Continue prn Duonebs   Family/ staff Communication:   Total Time:  Documentation:  Face to Face:  Family/Phone:   Labs/tests ordered:   Medication list reviewed and assessed for continued appropriateness. Monthly medication orders reviewed and signed.  Vikki Ports, NP-C [facility] [phone], [place] [address] Cell Phone (Mon-Fri 8am-5pm):  [phone] On Call:  [phone] & follow prompts after 5pm & weekends Office Phone:  [phone] Office Fax:  [phone]

## 2017-11-19 ENCOUNTER — Encounter: Payer: Self-pay | Admitting: Gerontology

## 2017-11-19 ENCOUNTER — Non-Acute Institutional Stay (SKILLED_NURSING_FACILITY): Payer: Medicare Other | Admitting: Gerontology

## 2017-11-19 DIAGNOSIS — J309 Allergic rhinitis, unspecified: Secondary | ICD-10-CM | POA: Diagnosis not present

## 2017-11-19 DIAGNOSIS — Z9181 History of falling: Secondary | ICD-10-CM

## 2017-11-19 DIAGNOSIS — M25461 Effusion, right knee: Secondary | ICD-10-CM

## 2017-11-19 DIAGNOSIS — E43 Unspecified severe protein-calorie malnutrition: Secondary | ICD-10-CM

## 2017-11-19 DIAGNOSIS — S32512D Fracture of superior rim of left pubis, subsequent encounter for fracture with routine healing: Secondary | ICD-10-CM

## 2017-11-19 NOTE — Progress Notes (Signed)
Location:   [facility] Room Number: 212A Place of Service:  SNF [phone]) Provider:  Toni Arthurs, NP-C  Einar Pheasant, MD  Patient Care Team: Einar Pheasant, MD as PCP - General (Internal Medicine)  Extended Emergency Contact Information Primary Emergency Contact: [facility] Address: [address]          [place], [place] [address] Johnnette Litter of [place] Phone: [phone] Mobile Phone: [phone] Relation: Daughter Secondary Emergency Contact: [name] Address: [address] [place]          [place], [place] [address] Home Phone: [phone] Work Phone: [phone] Relation: Daughter  Code Status:  FULL Goals of care: Advanced Directive information Advanced Directives 11/19/2017  Does Patient Have a Medical Advance Directive? No  Would patient like information on creating a medical advance directive? No - Patient declined     Chief Complaint  Patient presents with  . Medical Management of Chronic Issues    Routine Visit    HPI:  Pt is a 84 y.o. female seen today for medical management of chronic diseases. Pt was admitted to the facility for rehab following hospitalization for pubic rami fracture after mechanical fall. Pt has been participating in PT/OT. Pt has been progressing well. Ambulatory with rolling walker. Pt reports her pain is well controlled on current regimen. No further edema or pain in the right knee. She reports her appetite is good, voiding well and having regular BMs. Pt had pneumonia and an ileus 2 weeks ago. Both have since resolved. Pt denies chest pain or shortness of breath. No abdominal pain, no distention. Her only complaint at this point is some rhinitis r/t allergies. Otherwise, pt reports she is feeling better. VSS. No other complaints.      Past Medical History:  Diagnosis Date  . Asthma   . Diabetes mellitus ([place])   . Environmental allergies   . Hypertension    Past Surgical History:  Procedure  Laterality Date  . APPENDECTOMY    . TUBAL LIGATION      Allergies  Allergen Reactions  . Ace Inhibitors Other (See Comments)    Angioedema  . Hydrochlorothiazide W-Triamterene Other (See Comments)    Gout flare  . Lisinopril Swelling and Other (See Comments)  . Penicillins Swelling  . Trichlormethiazide Other (See Comments)    Gout flare    Allergies as of 11/19/2017      Reactions   Ace Inhibitors Other (See Comments)   Angioedema   Hydrochlorothiazide W-triamterene Other (See Comments)   Gout flare   Lisinopril Swelling, Other (See Comments)   Penicillins Swelling   Trichlormethiazide Other (See Comments)   Gout flare      Medication List        Accurate as of 11/19/17  2:00 PM. Always use your most recent med list.          acetaminophen 325 MG tablet Commonly known as:  TYLENOL Take 650 mg by mouth every 6 (six) hours as needed for mild pain.   amLODipine 10 MG tablet Commonly known as:  NORVASC Take 1 tablet (10 mg total) by mouth daily.   aspirin 325 MG EC tablet Take 325 mg by mouth 2 (two) times daily.   CALTRATE 600+D PO Take 1 tablet by mouth daily.   cholecalciferol 1000 units tablet Commonly known as:  VITAMIN D Take 2,000 Units by mouth daily. 2 tabs   diclofenac sodium 1 % Gel Commonly known as:  VOLTAREN Apply 4 g  topically 4 (four) times daily. Apply to the Right Knee for effusion   ENSURE ENLIVE PO Take 1 Bottle by mouth 2 (two) times daily between meals.   feeding supplement (PRO-STAT SUGAR FREE 64) Liqd Take 30 mLs by mouth 2 (two) times daily between meals.   fexofenadine 180 MG tablet Commonly known as:  ALLEGRA Take 1 tablet (180 mg total) by mouth daily.   glucose blood test strip Commonly known as:  ACCU-CHEK AVIVA PLUS CHECK SUGAR ONCE DAILY   hydrALAZINE 50 MG tablet Commonly known as:  APRESOLINE TAKE ONE (1) TABLET THREE (3) TIMES EACH DAY   insulin regular 100 units/mL injection Commonly known as:  NOVOLIN  R,HUMULIN R Inject 0-12 Units into the skin 3 (three) times daily before meals. 100 unit/mL; amt: Per Sliding Scale; If Blood Sugar is 176 to 250, give 3 Units.If Blood Sugar is 251 to 325, give 6 Units.If Blood Sugar is 326 to 450, give 10 Units. If Blood Sugar is greater than 450, give 12 Units  and recheck in 1 hour. If >450 after recheck, notify provider.   metoprolol succinate 50 MG 24 hr tablet Commonly known as:  TOPROL-XL Take 1 tablet (50 mg total) by mouth 2 (two) times daily. Take with or immediately following a meal.   oxyCODONE-acetaminophen 5-325 MG tablet Commonly known as:  PERCOCET/ROXICET Take 1 tablet by mouth every 6 (six) hours as needed for moderate pain.   sennosides-docusate sodium 8.6-50 MG tablet Commonly known as:  SENOKOT-S Take 2 tablets by mouth 2 (two) times daily.   SYNTHROID 50 MCG tablet Generic drug:  levothyroxine TAKE ONE (1) TABLET EACH DAY       Review of Systems  Constitutional: Negative for activity change, appetite change, chills, diaphoresis and fever.  HENT: Negative for congestion, mouth sores, nosebleeds, postnasal drip, sneezing, sore throat, trouble swallowing and voice change.   Respiratory: Negative for apnea, cough, choking, chest tightness, shortness of breath and wheezing.   Cardiovascular: Negative for chest pain, palpitations and leg swelling.  Gastrointestinal: Negative for abdominal distention, abdominal pain, constipation, diarrhea and nausea.  Genitourinary: Negative for difficulty urinating, dysuria, frequency and urgency.  Musculoskeletal: Positive for arthralgias (typical arthritis), gait problem and joint swelling. Negative for back pain and myalgias.  Skin: Negative for color change, pallor, rash and wound.  Neurological: Positive for weakness. Negative for dizziness, tremors, syncope, speech difficulty, numbness and headaches.  Psychiatric/Behavioral: Negative for agitation and behavioral problems.  All other systems  reviewed and are negative.   Immunization History  Administered Date(s) Administered  . Influenza, High Dose Seasonal PF 05/20/2016, 05/20/2017  . Influenza,inj,Quad PF,6+ Mos 05/12/2013, 04/26/2014, 04/10/2015  . Pneumococcal Conjugate-13 09/12/2014  . Pneumococcal Polysaccharide-23 03/10/2017   Pertinent  Health Maintenance Due  Topic Date Due  . FOOT EXAM  02/11/1928  . INFLUENZA VACCINE  03/11/2018  . HEMOGLOBIN A1C  05/03/2018  . OPHTHALMOLOGY EXAM  05/06/2018  . DEXA SCAN  Completed  . PNA vac Low Risk Adult  Completed   Fall Risk  08/10/2017 11/06/2016 06/19/2016 05/20/2016 06/23/2014  Falls in the past year? No No Yes No Yes  Number falls in past yr: - - 1 - 2 or more  Injury with Fall? - - No - -  Risk Factor Category  - - - - -  Risk for fall due to : - - - - -   Functional Status Survey:    Vitals:   11/19/17 1351  BP: 119/67  Pulse: 69  Resp: 20  Temp: 97.9 F (36.6 C)  TempSrc: Oral  SpO2: 96%  Weight: 99 lb (44.9 kg)  Height: 4\' 10"  (1.473 m)   Body mass index is 20.69 kg/m. Physical Exam  Constitutional: She is oriented to person, place, and time. Vital signs are normal. She appears well-developed and well-nourished. She is active and cooperative. She does not appear ill. No distress.  HENT:  Head: Normocephalic and atraumatic.  Mouth/Throat: Uvula is midline, oropharynx is clear and moist and mucous membranes are normal. Mucous membranes are not pale, not dry and not cyanotic.  Eyes: Pupils are equal, round, and reactive to light. Conjunctivae, EOM and lids are normal.  Neck: Trachea normal, normal range of motion and full passive range of motion without pain. Neck supple. No JVD present. No tracheal deviation, no edema and no erythema present. No thyromegaly present.  Cardiovascular: Normal rate, regular rhythm, normal heart sounds, intact distal pulses and normal pulses. Exam reveals no gallop, no distant heart sounds and no friction rub.  No murmur  heard. Pulses:      Dorsalis pedis pulses are 2+ on the right side, and 2+ on the left side.  No edema  Pulmonary/Chest: Effort normal and breath sounds normal. No accessory muscle usage. No respiratory distress. She has no decreased breath sounds. She has no wheezes. She has no rhonchi. She has no rales. She exhibits no tenderness.  Abdominal: Soft. Normal appearance and bowel sounds are normal. She exhibits no distension and no ascites. There is no tenderness.  Musculoskeletal: Normal range of motion. She exhibits no edema or tenderness.  Expected osteoarthritis, stiffness; Bilateral Calves soft, supple. Negative Homan's Sign. B- pedal pulses equal; generlized weakness  Neurological: She is alert and oriented to person, place, and time. She has normal strength.  Skin: Skin is warm, dry and intact. She is not diaphoretic. No cyanosis. No pallor. Nails show no clubbing.  Psychiatric: She has a normal mood and affect. Her speech is normal and behavior is normal. Judgment and thought content normal. Cognition and memory are normal.  Nursing note and vitals reviewed.   Labs reviewed: Recent Labs    10/30/17 2307 11/02/17 0303 11/03/17 1115  NA 140 139 137  K 4.2 4.3 4.3  CL 108 107 106  CO2 24 23 22   GLUCOSE 192* 167* 212*  BUN 32* 23* 27*  CREATININE 0.83 0.81 0.86  CALCIUM 9.1 8.4* 8.1*   Recent Labs    03/10/17 1446 06/26/17 2122 08/10/17 1032 11/03/17 1115  AST 19 30 24 23   ALT 12 18 17 14   ALKPHOS 34* 47  --  46  BILITOT 0.3 0.6 0.3 0.6  PROT 7.1 8.2* 6.9 6.6  ALBUMIN 3.8 3.9  --  2.5*   Recent Labs    03/10/17 1446 06/26/17 2122 10/30/17 2307 11/03/17 1115  WBC 6.3 5.6 10.0 7.5  NEUTROABS 2.7  --   --  5.6  HGB 11.6* 12.2 12.1 10.8*  HCT 35.9* 37.4 35.3 32.5*  MCV 95.5 92.9 92.1 92.9  PLT 230.0 236 226 195   Lab Results  Component Value Date   TSH 1.765 10/31/2017   Lab Results  Component Value Date   HGBA1C 6.4 (H) 10/31/2017   Lab Results    Component Value Date   CHOL 209 (H) 06/21/2014   HDL 77.00 06/21/2014   LDLCALC 111 (H) 06/21/2014   LDLDIRECT 107.1 07/29/2013   TRIG 105.0 06/21/2014   CHOLHDL 3 06/21/2014    Significant Diagnostic Results  in last 30 days:  Ct Knee Right Wo Contrast  Result Date: 11/01/2017 CLINICAL DATA:  Fall at home onto knees. Right knee pain and swelling. Pelvic fractures. EXAM: CT OF THE RIGHT KNEE WITHOUT CONTRAST TECHNIQUE: Multidetector CT imaging of the right knee was performed according to the standard protocol. Multiplanar CT image reconstructions were also generated. COMPARISON:  Radiographs 10/30/2017. FINDINGS: Bones/Joint/Cartilage The bones are diffusely demineralized. There is no evidence of acute fracture or dislocation. There are minimal tricompartmental degenerative changes for age. There is a moderate to large knee joint effusion without definite layering intra-articular fat. Ligaments Suboptimally assessed by CT. The cruciate ligaments appear grossly intact. There may be synovitis in the intercondylar notch. Muscles and Tendons The extensor mechanism is intact. There is some fatty atrophy proximally in the medial head of the gastrocnemius muscle. Soft tissues Extensive vascular calcifications. Mild subcutaneous edema surrounding the knee. No focal hematoma. IMPRESSION: 1. Osteopenia without evidence of acute fracture or dislocation. 2. Moderate to large knee joint effusion. This could be a manifestation internal derangement. 3. Grossly intact cruciate ligaments with possible synovitis in the intercondylar notch. Diffuse vascular calcifications. Electronically Signed   By: Richardean Sale M.D.   On: 11/01/2017 17:03   Dg Chest Port 1 View  Result Date: 10/30/2017 CLINICAL DATA:  Chest pain. EXAM: PORTABLE CHEST 1 VIEW COMPARISON:  07/06/2014 FINDINGS: Unchanged cardiomegaly with tortuous atherosclerotic thoracic aorta. No pulmonary edema. No consolidation, pleural effusion or pneumothorax.  Degenerative change in both shoulders. IMPRESSION: Chronic cardiomegaly and tortuous atherosclerotic thoracic aorta. No acute abnormality. Electronically Signed   By: Jeb Levering M.D.   On: 10/30/2017 23:11   Dg Knee Complete 4 Views Left  Result Date: 10/30/2017 CLINICAL DATA:  Fall with knee pain EXAM: LEFT KNEE - COMPLETE 4+ VIEW COMPARISON:  12/29/2013 FINDINGS: No acute displaced fracture or malalignment. Mild patellofemoral and medial joint space degenerative change. Trace knee effusion. Vascular calcification. IMPRESSION: Mild degenerative changes. Trace knee effusion. No acute fracture or malalignment Electronically Signed   By: Donavan Foil M.D.   On: 10/30/2017 20:05   Dg Knee Complete 4 Views Right  Result Date: 10/30/2017 CLINICAL DATA:  Fall with knee pain EXAM: RIGHT KNEE - COMPLETE 4+ VIEW COMPARISON:  None. FINDINGS: No acute displaced fracture or malalignment. Mild patellofemoral and medial joint space degenerative change. Vascular calcification. No large knee effusion. IMPRESSION: No acute osseous abnormality Electronically Signed   By: Donavan Foil M.D.   On: 10/30/2017 20:06   Dg Hips Bilat W Or Wo Pelvis 3-4 Views  Result Date: 10/31/2017 CLINICAL DATA:  Pain after fall. Bilateral hip pain. Cannot support weight. Right worse than left. EXAM: DG HIP (WITH OR WITHOUT PELVIS) 3-4V BILAT COMPARISON:  Reformats from abdominal CT 06/26/2017 FINDINGS: Right inferior pubic ramus fracture is remote in unchanged from prior CT. No evidence of acute fracture. Femoral head remains seated. Fracture of the left superior pubic ramus is new from prior exam, minimally displaced. Probable nondisplaced left inferior pubic ramus fracture. Femoral head remains seated. Pubic symphysis and sacroiliac joints remain congruent. Advanced vascular calcifications. IMPRESSION: 1. Acute left superior pubic ramus fracture, minimally displaced, suspect nondisplaced left inferior ramus fracture. 2. Remote right  inferior pubic ramus fracture. No acute fracture of the right hip. Electronically Signed   By: Jeb Levering M.D.   On: 10/31/2017 05:03    Assessment/Plan  Fracture of superior rim of left pubis, subsequent encounter for fracture with routine healing  Knee effusion, right  History of  falling  Continue PT/OT  Continue exercises at taught by PT/OT  Continue ice pack prn for pain, edema  Continue Tylenol 650 mg po Q 6 hours prn, pain  Continue Percocet 5/325 mg po Q 6 hours prn pain  Continue Voltaren 1% Gel 4 grams QID for pain  Continue ASA 325 mg po BID for DVT prophylaxis  Protein-calorie malnutrition, severe  Continue Pro-stat 30 mL PO BID  Continue Ensure Enlive 1 bottle po BID  Allergic rhinitis, unspecified seasonality, unspecified trigger  Continue Allegra 180 mg po Q Day  Add Fluticasone 50 mcg 1 spray in each nostril Q Day  Family/ staff Communication:   Total Time:  Documentation:  Face to Face:  Family/Phone:   Labs/tests ordered:    Medication list reviewed and assessed for continued appropriateness. Monthly medication orders reviewed and signed.  Vikki Ports, NP-C Geriatrics [facility] [phone], [place] [address] Cell Phone (Mon-Fri 8am-5pm):  [phone] On Call:  [phone] & follow prompts after 5pm & weekends Office Phone:  [phone] Office Fax:  [phone]

## 2017-11-25 ENCOUNTER — Encounter: Payer: Self-pay | Admitting: Gerontology

## 2017-11-25 NOTE — Progress Notes (Signed)
Opened in error; Disregard.

## 2017-11-27 DIAGNOSIS — E43 Unspecified severe protein-calorie malnutrition: Secondary | ICD-10-CM | POA: Diagnosis not present

## 2017-11-27 DIAGNOSIS — M810 Age-related osteoporosis without current pathological fracture: Secondary | ICD-10-CM | POA: Diagnosis not present

## 2017-11-27 DIAGNOSIS — E1151 Type 2 diabetes mellitus with diabetic peripheral angiopathy without gangrene: Secondary | ICD-10-CM | POA: Diagnosis not present

## 2017-11-27 DIAGNOSIS — S32512D Fracture of superior rim of left pubis, subsequent encounter for fracture with routine healing: Secondary | ICD-10-CM | POA: Diagnosis not present

## 2017-11-27 DIAGNOSIS — J45909 Unspecified asthma, uncomplicated: Secondary | ICD-10-CM | POA: Diagnosis not present

## 2017-11-27 DIAGNOSIS — Z9181 History of falling: Secondary | ICD-10-CM | POA: Diagnosis not present

## 2017-11-27 DIAGNOSIS — E039 Hypothyroidism, unspecified: Secondary | ICD-10-CM | POA: Diagnosis not present

## 2017-11-27 DIAGNOSIS — I10 Essential (primary) hypertension: Secondary | ICD-10-CM | POA: Diagnosis not present

## 2017-11-30 ENCOUNTER — Telehealth: Payer: Self-pay | Admitting: Internal Medicine

## 2017-11-30 NOTE — Telephone Encounter (Signed)
fyi

## 2017-11-30 NOTE — Telephone Encounter (Signed)
Notify family to hold lasix and potassium today.  She has an appt with me tomorrow.  Have them bring all of her medications to her appt and we will sort through them then.

## 2017-11-30 NOTE — Telephone Encounter (Signed)
Spoke with Caryl Pina at [facility]. States pt. Is at home now and family is giving medications differently than what is on discharge orders and chart as well. Needs verification and would like orders for nurse visits :  1x week x 9 weeks. Family is giving Zantac 150 mg daily Lasix 20 mg daily  (d/c in hospital) Potassium 10 meq daily  (d/c in hospital) Amlodipine 10 mg daily is orderd - family is giving 5 mg daily. ASA 325 mg po twice daily ordered. Family giving 81 mg daily. Other medications seem to be correct. Please advise. Ashley's contact number is [phone].

## 2017-11-30 NOTE — Telephone Encounter (Signed)
Daughter notified & I spoke with Tara [name] @ [facility] gave her an update & a verbal for home visits.

## 2017-11-30 NOTE — Telephone Encounter (Signed)
Copied from [place] [phone]. Topic: Inquiry >> Nov 30, 2017  9:38 AM Tara [name] wrote: Reason for CRM: [facility] called to go [facility]'s [facility], [facility] are concerned about at least 6 of pt's meds:  ranitidine (ZANTAC) 150 MG capsule [[id number]]  DISCONTINUED  metoprolol succinate (TOPROL-XL) 50 MG 24 hr tablet [[id number]] FAMILY HAS BEEN GIVING 25MG  SINCE BACK FROM HOSPITAL furosemide (LASIX) 20 MG tablet [[id number]]  DISCONTINUED potassium chloride (K-DUR) 10 MEQ tablet [[id number]]  DISCONTINUED amLODipine (NORVASC) 10 MG tablet [[id number]]  aspirin 325 MG EC tablet [[id number]]  and needing verbal orders for nursing visits; contact [facility] @ [phone]

## 2017-12-01 ENCOUNTER — Ambulatory Visit (INDEPENDENT_AMBULATORY_CARE_PROVIDER_SITE_OTHER): Payer: Medicare Other | Admitting: Internal Medicine

## 2017-12-01 ENCOUNTER — Other Ambulatory Visit: Payer: Self-pay | Admitting: Radiology

## 2017-12-01 VITALS — BP 160/82 | HR 64 | Temp 98.5°F | Resp 16 | Wt 96.4 lb

## 2017-12-01 DIAGNOSIS — I10 Essential (primary) hypertension: Secondary | ICD-10-CM | POA: Diagnosis not present

## 2017-12-01 DIAGNOSIS — J309 Allergic rhinitis, unspecified: Secondary | ICD-10-CM | POA: Diagnosis not present

## 2017-12-01 DIAGNOSIS — E1151 Type 2 diabetes mellitus with diabetic peripheral angiopathy without gangrene: Secondary | ICD-10-CM | POA: Diagnosis not present

## 2017-12-01 DIAGNOSIS — I739 Peripheral vascular disease, unspecified: Secondary | ICD-10-CM

## 2017-12-01 DIAGNOSIS — S32512D Fracture of superior rim of left pubis, subsequent encounter for fracture with routine healing: Secondary | ICD-10-CM | POA: Diagnosis not present

## 2017-12-01 DIAGNOSIS — D649 Anemia, unspecified: Secondary | ICD-10-CM

## 2017-12-01 DIAGNOSIS — E7800 Pure hypercholesterolemia, unspecified: Secondary | ICD-10-CM

## 2017-12-01 DIAGNOSIS — E538 Deficiency of other specified B group vitamins: Secondary | ICD-10-CM

## 2017-12-01 DIAGNOSIS — R600 Localized edema: Secondary | ICD-10-CM | POA: Diagnosis not present

## 2017-12-01 LAB — CBC WITH DIFFERENTIAL/PLATELET
BASOS ABS: 0 10*3/uL (ref 0.0–0.1)
Basophils Relative: 0.4 % (ref 0.0–3.0)
EOS ABS: 0.2 10*3/uL (ref 0.0–0.7)
Eosinophils Relative: 3.1 % (ref 0.0–5.0)
HCT: 31.4 % — ABNORMAL LOW (ref 36.0–46.0)
Hemoglobin: 10.3 g/dL — ABNORMAL LOW (ref 12.0–15.0)
LYMPHS ABS: 2.5 10*3/uL (ref 0.7–4.0)
Lymphocytes Relative: 39.9 % (ref 12.0–46.0)
MCHC: 32.8 g/dL (ref 30.0–36.0)
MCV: 93.5 fl (ref 78.0–100.0)
MONO ABS: 0.7 10*3/uL (ref 0.1–1.0)
Monocytes Relative: 10.8 % (ref 3.0–12.0)
NEUTROS PCT: 45.8 % (ref 43.0–77.0)
Neutro Abs: 2.8 10*3/uL (ref 1.4–7.7)
Platelets: 298 10*3/uL (ref 150.0–400.0)
RBC: 3.36 Mil/uL — AB (ref 3.87–5.11)
RDW: 16.4 % — ABNORMAL HIGH (ref 11.5–15.5)
WBC: 6.2 10*3/uL (ref 4.0–10.5)

## 2017-12-01 LAB — HEPATIC FUNCTION PANEL
ALT: 12 U/L (ref 0–35)
AST: 20 U/L (ref 0–37)
Albumin: 3 g/dL — ABNORMAL LOW (ref 3.5–5.2)
Alkaline Phosphatase: 56 U/L (ref 39–117)
BILIRUBIN DIRECT: 0 mg/dL (ref 0.0–0.3)
BILIRUBIN TOTAL: 0.2 mg/dL (ref 0.2–1.2)
TOTAL PROTEIN: 6.8 g/dL (ref 6.0–8.3)

## 2017-12-01 LAB — BASIC METABOLIC PANEL
BUN: 24 mg/dL — AB (ref 6–23)
CHLORIDE: 107 meq/L (ref 96–112)
CO2: 27 meq/L (ref 19–32)
Calcium: 8.7 mg/dL (ref 8.4–10.5)
Creatinine, Ser: 0.75 mg/dL (ref 0.40–1.20)
GFR: 91.8 mL/min (ref >60.00)
Glucose, Bld: 113 mg/dL — ABNORMAL HIGH (ref 70–99)
POTASSIUM: 3.9 meq/L (ref 3.5–5.1)
Sodium: 140 mEq/L (ref 135–145)

## 2017-12-01 LAB — FERRITIN: FERRITIN: 129.5 ng/mL (ref 10.0–291.0)

## 2017-12-01 NOTE — Progress Notes (Signed)
Patient ID: TALIA HOHEISEL, female   DOB: 02-09-1918, 84 y.o.   MRN: [id number]   Subjective:    Patient ID: [name], female    DOB: 1917/10/05, 84 y.o.   MRN: [id number]  HPI  Patient here for rehab follow up.  She was admitted 10/30/17 and found to have pubic remus fracture.  Also, diagnosed with hypertensive urgency.  Was discharged on 11/02/17 to rehab.  On 11/03/17 was diagnosed with pneumonia and an ileus.  Was placed on IV reglan and abx.  Was also placed on voltaren gel for knee effusion.   Was discharged 11/25/17 from rehab.  At home now.  She comes in today for rehab f/u.  She is accompanied by her daughter.  History obtained from both of them.  Trying to stay as active as possible.  No chest pain.  Breathing stable.  No increased sob.  Still some nasal congestion.  No acid reflux.  No abdominal pain.  Bowels moving.  Still with swelling in her legs.  Is some better, but still persistent.  Blood pressure still varies.     Past Medical History:  Diagnosis Date  . Asthma   . Diabetes mellitus ([place])   . Environmental allergies   . Hypertension    Past Surgical History:  Procedure Laterality Date  . APPENDECTOMY    . TUBAL LIGATION     Family History  Problem Relation Age of Onset  . Hypertension Mother   . Cancer Brother        lung (question)  . Cancer Sister        questionable type   Social History   Socioeconomic History  . Marital status: Widowed    Spouse name: Not on file  . Number of children: Not on file  . Years of education: Not on file  . Highest education level: Not on file  Occupational History  . Not on file  Social Needs  . Financial resource strain: Not on file  . Food insecurity:    Worry: Not on file    Inability: Not on file  . Transportation needs:    Medical: Not on file    Non-medical: Not on file  Tobacco Use  . Smoking status: Never Smoker  . Smokeless tobacco: Never Used  Substance and Sexual Activity  . Alcohol use: No   Alcohol/week: 0.0 oz  . Drug use: No  . Sexual activity: Never  Lifestyle  . Physical activity:    Days per week: Not on file    Minutes per session: Not on file  . Stress: Not on file  Relationships  . Social connections:    Talks on phone: Not on file    Gets together: Not on file    Attends religious service: Not on file    Active member of club or organization: Not on file    Attends meetings of clubs or organizations: Not on file    Relationship status: Not on file  Other Topics Concern  . Not on file  Social History Narrative  . Not on file    Outpatient Encounter Medications as of 12/01/2017  Medication Sig  . acetaminophen (TYLENOL) 325 MG tablet Take 650 mg by mouth every 6 (six) hours as needed for mild pain.   . Amino Acids-Protein Hydrolys (FEEDING SUPPLEMENT, PRO-STAT SUGAR FREE 64,) LIQD Take 30 mLs by mouth 2 (two) times daily between meals.  Marland Kitchen amLODipine (NORVASC) 5 MG tablet Take 5 mg by mouth daily.  Marland Kitchen  aspirin (ASPIRIN 81) 81 MG EC tablet Take 81 mg by mouth daily. Swallow whole.  . Calcium Carbonate-Vitamin D (CALTRATE 600+D PO) Take 1 tablet by mouth daily.   . diclofenac sodium (VOLTAREN) 1 % GEL Apply 4 g topically 4 (four) times daily. Apply to the Right Knee for effusion  . fexofenadine (ALLEGRA) 180 MG tablet Take 1 tablet (180 mg total) by mouth daily.  . fluticasone (FLONASE) 50 MCG/ACT nasal spray Place into both nostrils daily.  Marland Kitchen glucose blood (ACCU-CHEK AVIVA PLUS) test strip CHECK SUGAR ONCE DAILY  . hydrALAZINE (APRESOLINE) 50 MG tablet TAKE ONE (1) TABLET THREE (3) TIMES EACH DAY  . metoprolol tartrate (LOPRESSOR) 25 MG tablet Take 25 mg by mouth 2 (two) times daily.  Marland Kitchen SYNTHROID 50 MCG tablet TAKE ONE (1) TABLET EACH DAY  . [DISCONTINUED] amLODipine (NORVASC) 10 MG tablet Take 1 tablet (10 mg total) by mouth daily. (Patient taking differently: Take 5 mg by mouth daily. )  . [DISCONTINUED] furosemide (LASIX) 20 MG tablet Take 20 mg by mouth daily.   . [DISCONTINUED] ranitidine (ZANTAC) 150 MG tablet Take 150 mg by mouth 2 (two) times daily.  . [DISCONTINUED] aspirin 325 MG EC tablet Take 325 mg by mouth 2 (two) times daily.  . [DISCONTINUED] cholecalciferol (VITAMIN D) 1000 units tablet Take 2,000 Units by mouth daily. 2 tabs  . [DISCONTINUED] insulin regular (NOVOLIN R,HUMULIN R) 100 units/mL injection Inject 0-12 Units into the skin 3 (three) times daily before meals. 100 unit/mL; amt: Per Sliding Scale; If Blood Sugar is 176 to 250, give 3 Units.If Blood Sugar is 251 to 325, give 6 Units.If Blood Sugar is 326 to 450, give 10 Units. If Blood Sugar is greater than 450, give 12 Units  and recheck in 1 hour. If >450 after recheck, notify provider.  . [DISCONTINUED] metoprolol succinate (TOPROL-XL) 50 MG 24 hr tablet Take 1 tablet (50 mg total) by mouth 2 (two) times daily. Take with or immediately following a meal. (Patient taking differently: Take 25 mg by mouth 2 (two) times daily. Take with or immediately following a meal.)  . [DISCONTINUED] Nutritional Supplements (ENSURE ENLIVE PO) Take 1 Bottle by mouth 2 (two) times daily between meals.  . [DISCONTINUED] oxyCODONE-acetaminophen (PERCOCET/ROXICET) 5-325 MG tablet Take 1 tablet by mouth every 6 (six) hours as needed for moderate pain. (Patient not taking: Reported on 12/01/2017)  . [DISCONTINUED] sennosides-docusate sodium (SENOKOT-S) 8.6-50 MG tablet Take 2 tablets by mouth 2 (two) times daily.   No facility-administered encounter medications on file as of 12/01/2017.     Review of Systems  Constitutional: Negative for appetite change and unexpected weight change.  HENT: Positive for congestion. Negative for sinus pressure.   Respiratory: Negative for cough, chest tightness and shortness of breath.   Cardiovascular: Positive for leg swelling. Negative for chest pain and palpitations.  Gastrointestinal: Negative for abdominal pain, diarrhea, nausea and vomiting.  Genitourinary: Negative  for difficulty urinating and dysuria.  Musculoskeletal: Negative for joint swelling and myalgias.  Skin: Negative for color change and rash.  Neurological: Negative for dizziness, light-headedness and headaches.  Psychiatric/Behavioral: Negative for agitation and dysphoric mood.       Objective:    Physical Exam  Constitutional: She appears well-developed and well-nourished. No distress.  HENT:  Nose: Nose normal.  Mouth/Throat: Oropharynx is clear and moist.  Neck: Neck supple. No thyromegaly present.  Cardiovascular: Normal rate and regular rhythm.  Pulmonary/Chest: Breath sounds normal. No respiratory distress. She has no wheezes.  Abdominal: Soft. Bowel sounds are normal. There is no tenderness.  Musculoskeletal: She exhibits no tenderness.  Pedal and ankle edema.  No increased erythema.   Lymphadenopathy:    She has no cervical adenopathy.  Skin: No rash noted. No erythema.  Psychiatric: She has a normal mood and affect. Her behavior is normal.    BP (!) 160/82 (BP Location: Left Arm, Patient Position: Sitting, Cuff Size: Normal)   Pulse 64   Temp 98.5 F (36.9 C) (Oral)   Resp 16   Wt 96 lb 6.4 oz (43.7 kg)   SpO2 97%   BMI 20.15 kg/m  Wt Readings from Last 3 Encounters:  12/01/17 96 lb 6.4 oz (43.7 kg)  11/19/17 99 lb (44.9 kg)  11/12/17 96 lb 9.6 oz (43.8 kg)     Lab Results  Component Value Date   WBC 6.2 12/01/2017   HGB 10.3 (L) 12/01/2017   HCT 31.4 (L) 12/01/2017   PLT 298.0 12/01/2017   GLUCOSE 113 (H) 12/01/2017   CHOL 209 (H) 06/21/2014   TRIG 105.0 06/21/2014   HDL 77.00 06/21/2014   LDLDIRECT 107.1 07/29/2013   LDLCALC 111 (H) 06/21/2014   ALT 12 12/01/2017   AST 20 12/01/2017   NA 140 12/01/2017   K 3.9 12/01/2017   CL 107 12/01/2017   CREATININE 0.75 12/01/2017   BUN 24 (H) 12/01/2017   CO2 27 12/01/2017   TSH 1.765 10/31/2017   INR 1.0 01/25/2013   HGBA1C 6.4 (H) 10/31/2017   MICROALBUR 115.0 Repeated and verified X2. (H)  07/29/2013       Assessment & Plan:   Problem List Items Addressed This Visit    Allergic rhinitis    Continue nasal sprays as directed.  Follow.        Anemia - Primary    Recheck cbc and ferritin.        Relevant Orders   CBC with Differential/Platelet (Completed)   Ferritin (Completed)   B12 deficiency    Continue b12 injections.        Bilateral lower extremity edema    Persistent.  Off lasix.  Will remain off.  Discussed compression hose and elevation.  Have vascular surgery reevaluate.        Diabetes mellitus with peripheral vascular disease ([place])    Blood sugars have previously been under reasonable control.  Follow met b and a1c.        Relevant Medications   aspirin (ASPIRIN 81) 81 MG EC tablet   amLODipine (NORVASC) 5 MG tablet   metoprolol tartrate (LOPRESSOR) 25 MG tablet   Other Relevant Orders   Hepatic function panel (Completed)   Fracture of superior rim of left pubis, subsequent encounter for fracture with routine healing    Doing well.  No increased pain.  Follow.  Home from rehab.  Working with therapy.        Hypercholesteremia    Follow lipid panel.       Relevant Medications   aspirin (ASPIRIN 81) 81 MG EC tablet   amLODipine (NORVASC) 5 MG tablet   metoprolol tartrate (LOPRESSOR) 25 MG tablet   Hypertension    Blood pressure elevated.  Have referred to nephrology for further recommendations.  Have them spot check her pressure.        Relevant Medications   aspirin (ASPIRIN 81) 81 MG EC tablet   amLODipine (NORVASC) 5 MG tablet   metoprolol tartrate (LOPRESSOR) 25 MG tablet   Other Relevant Orders   Basic  metabolic panel (Completed)   Lower extremity edema    Persistent swelling.  Off lasix.  Refer to vascular surgery for further evaluation.  Discussed compression hose and leg elevation.        Peripheral vascular disease ([place])    Has been evaluated by vascular surgery.  Refer back for persistent swelling and pain.         Relevant Medications   aspirin (ASPIRIN 81) 81 MG EC tablet   amLODipine (NORVASC) 5 MG tablet   metoprolol tartrate (LOPRESSOR) 25 MG tablet       Einar Pheasant, MD

## 2017-12-02 ENCOUNTER — Other Ambulatory Visit: Payer: Self-pay | Admitting: Internal Medicine

## 2017-12-02 ENCOUNTER — Other Ambulatory Visit (INDEPENDENT_AMBULATORY_CARE_PROVIDER_SITE_OTHER): Payer: Medicare Other

## 2017-12-02 DIAGNOSIS — E43 Unspecified severe protein-calorie malnutrition: Secondary | ICD-10-CM | POA: Diagnosis not present

## 2017-12-02 DIAGNOSIS — I10 Essential (primary) hypertension: Secondary | ICD-10-CM | POA: Diagnosis not present

## 2017-12-02 DIAGNOSIS — D649 Anemia, unspecified: Secondary | ICD-10-CM | POA: Diagnosis not present

## 2017-12-02 DIAGNOSIS — S32512D Fracture of superior rim of left pubis, subsequent encounter for fracture with routine healing: Secondary | ICD-10-CM | POA: Diagnosis not present

## 2017-12-02 DIAGNOSIS — M810 Age-related osteoporosis without current pathological fracture: Secondary | ICD-10-CM | POA: Diagnosis not present

## 2017-12-02 DIAGNOSIS — E1151 Type 2 diabetes mellitus with diabetic peripheral angiopathy without gangrene: Secondary | ICD-10-CM | POA: Diagnosis not present

## 2017-12-02 DIAGNOSIS — Z9181 History of falling: Secondary | ICD-10-CM | POA: Diagnosis not present

## 2017-12-02 DIAGNOSIS — J45909 Unspecified asthma, uncomplicated: Secondary | ICD-10-CM | POA: Diagnosis not present

## 2017-12-02 DIAGNOSIS — E039 Hypothyroidism, unspecified: Secondary | ICD-10-CM | POA: Diagnosis not present

## 2017-12-02 LAB — VITAMIN B12: Vitamin B-12: 306 pg/mL (ref 211–911)

## 2017-12-02 LAB — IBC PANEL
Iron: 26 ug/dL — ABNORMAL LOW (ref 42–145)
SATURATION RATIOS: 9.9 % — AB (ref 20.0–50.0)
Transferrin: 188 mg/dL — ABNORMAL LOW (ref 212.0–360.0)

## 2017-12-02 NOTE — Progress Notes (Signed)
Add on lab orders placed.

## 2017-12-03 ENCOUNTER — Telehealth: Payer: Self-pay | Admitting: Internal Medicine

## 2017-12-03 DIAGNOSIS — E43 Unspecified severe protein-calorie malnutrition: Secondary | ICD-10-CM | POA: Diagnosis not present

## 2017-12-03 DIAGNOSIS — E039 Hypothyroidism, unspecified: Secondary | ICD-10-CM | POA: Diagnosis not present

## 2017-12-03 DIAGNOSIS — Z9181 History of falling: Secondary | ICD-10-CM | POA: Diagnosis not present

## 2017-12-03 DIAGNOSIS — J45909 Unspecified asthma, uncomplicated: Secondary | ICD-10-CM | POA: Diagnosis not present

## 2017-12-03 DIAGNOSIS — E1151 Type 2 diabetes mellitus with diabetic peripheral angiopathy without gangrene: Secondary | ICD-10-CM | POA: Diagnosis not present

## 2017-12-03 DIAGNOSIS — M810 Age-related osteoporosis without current pathological fracture: Secondary | ICD-10-CM | POA: Diagnosis not present

## 2017-12-03 DIAGNOSIS — I10 Essential (primary) hypertension: Secondary | ICD-10-CM | POA: Diagnosis not present

## 2017-12-03 DIAGNOSIS — S32512D Fracture of superior rim of left pubis, subsequent encounter for fracture with routine healing: Secondary | ICD-10-CM | POA: Diagnosis not present

## 2017-12-03 NOTE — Telephone Encounter (Signed)
Copied from [place] [id number]. Topic: Quick Communication - See Telephone Encounter >> Dec 03, 2017  3:31 PM Corie Chiquito, [place] wrote: CRM for notification. Sherlynn Stalls is calling because she would like to have a verbal order for the patient to receive OT 2 times a week for 1 week then 1 time a week for 1 week. If someone could give her a call back about this at [phone]

## 2017-12-03 NOTE — Telephone Encounter (Signed)
Copied from [place] [id number]. Topic: Quick Communication - See Telephone Encounter >> Dec 03, 2017 12:43 PM Synthia Innocent wrote: CRM for notification. See Telephone encounter for: 12/03/17. Requesting verbal order for PT for 2x for 3 weeks

## 2017-12-03 NOTE — Telephone Encounter (Signed)
Please advise 

## 2017-12-04 ENCOUNTER — Telehealth: Payer: Self-pay

## 2017-12-04 NOTE — Telephone Encounter (Signed)
Patient and daughter notified per phone encounter that Larena Glassman gave Sherlynn Stalls for PT. Patient daughter verbalized understanding of this.  Copied from [place] [phone]. Topic: Quick Communication - See Telephone Encounter >> Dec 03, 2017 12:43 PM Synthia Innocent wrote: CRM for notification. See Telephone encounter for: 12/03/17. Requesting verbal order for PT for 2x for 3 weeks >> Dec 04, 2017 11:18 AM Percell Belt A wrote: PT called back today , asking if she could get these verbals today so she can go out Monday?

## 2017-12-04 NOTE — Telephone Encounter (Signed)
Verbals given to [employer]

## 2017-12-04 NOTE — Telephone Encounter (Signed)
Verbals given to [employer]

## 2017-12-05 ENCOUNTER — Encounter: Payer: Self-pay | Admitting: Internal Medicine

## 2017-12-05 NOTE — Assessment & Plan Note (Addendum)
Doing well.  No increased pain.  Follow.  Home from rehab.  Working with therapy.

## 2017-12-05 NOTE — Assessment & Plan Note (Signed)
Follow lipid panel.   

## 2017-12-05 NOTE — Assessment & Plan Note (Signed)
Persistent.  Off lasix.  Will remain off.  Discussed compression hose and elevation.  Have vascular surgery reevaluate.

## 2017-12-05 NOTE — Assessment & Plan Note (Signed)
Has been evaluated by vascular surgery.  Refer back for persistent swelling and pain.

## 2017-12-05 NOTE — Assessment & Plan Note (Signed)
Continue b12 injections.  

## 2017-12-05 NOTE — Assessment & Plan Note (Signed)
Recheck cbc and ferritin.

## 2017-12-05 NOTE — Assessment & Plan Note (Signed)
Blood sugars have previously been under reasonable control.  Follow met b and a1c.

## 2017-12-05 NOTE — Assessment & Plan Note (Signed)
Persistent swelling.  Off lasix.  Refer to vascular surgery for further evaluation.  Discussed compression hose and leg elevation.

## 2017-12-05 NOTE — Assessment & Plan Note (Signed)
Blood pressure elevated.  Have referred to nephrology for further recommendations.  Have them spot check her pressure.

## 2017-12-05 NOTE — Assessment & Plan Note (Signed)
Continue nasal sprays as directed.  Follow.

## 2017-12-07 DIAGNOSIS — I10 Essential (primary) hypertension: Secondary | ICD-10-CM | POA: Diagnosis not present

## 2017-12-07 DIAGNOSIS — E1151 Type 2 diabetes mellitus with diabetic peripheral angiopathy without gangrene: Secondary | ICD-10-CM | POA: Diagnosis not present

## 2017-12-07 DIAGNOSIS — M810 Age-related osteoporosis without current pathological fracture: Secondary | ICD-10-CM | POA: Diagnosis not present

## 2017-12-07 DIAGNOSIS — J45909 Unspecified asthma, uncomplicated: Secondary | ICD-10-CM | POA: Diagnosis not present

## 2017-12-07 DIAGNOSIS — E43 Unspecified severe protein-calorie malnutrition: Secondary | ICD-10-CM | POA: Diagnosis not present

## 2017-12-07 DIAGNOSIS — S32512D Fracture of superior rim of left pubis, subsequent encounter for fracture with routine healing: Secondary | ICD-10-CM | POA: Diagnosis not present

## 2017-12-07 DIAGNOSIS — E039 Hypothyroidism, unspecified: Secondary | ICD-10-CM | POA: Diagnosis not present

## 2017-12-07 DIAGNOSIS — Z9181 History of falling: Secondary | ICD-10-CM | POA: Diagnosis not present

## 2017-12-10 DIAGNOSIS — S32512D Fracture of superior rim of left pubis, subsequent encounter for fracture with routine healing: Secondary | ICD-10-CM | POA: Diagnosis not present

## 2017-12-10 DIAGNOSIS — E1151 Type 2 diabetes mellitus with diabetic peripheral angiopathy without gangrene: Secondary | ICD-10-CM | POA: Diagnosis not present

## 2017-12-10 DIAGNOSIS — I10 Essential (primary) hypertension: Secondary | ICD-10-CM | POA: Diagnosis not present

## 2017-12-10 DIAGNOSIS — E039 Hypothyroidism, unspecified: Secondary | ICD-10-CM | POA: Diagnosis not present

## 2017-12-10 DIAGNOSIS — M810 Age-related osteoporosis without current pathological fracture: Secondary | ICD-10-CM | POA: Diagnosis not present

## 2017-12-10 DIAGNOSIS — Z9181 History of falling: Secondary | ICD-10-CM | POA: Diagnosis not present

## 2017-12-10 DIAGNOSIS — J45909 Unspecified asthma, uncomplicated: Secondary | ICD-10-CM | POA: Diagnosis not present

## 2017-12-10 DIAGNOSIS — E43 Unspecified severe protein-calorie malnutrition: Secondary | ICD-10-CM | POA: Diagnosis not present

## 2017-12-11 DIAGNOSIS — E43 Unspecified severe protein-calorie malnutrition: Secondary | ICD-10-CM | POA: Diagnosis not present

## 2017-12-11 DIAGNOSIS — Z9181 History of falling: Secondary | ICD-10-CM | POA: Diagnosis not present

## 2017-12-11 DIAGNOSIS — S32512D Fracture of superior rim of left pubis, subsequent encounter for fracture with routine healing: Secondary | ICD-10-CM | POA: Diagnosis not present

## 2017-12-11 DIAGNOSIS — J45909 Unspecified asthma, uncomplicated: Secondary | ICD-10-CM | POA: Diagnosis not present

## 2017-12-11 DIAGNOSIS — I10 Essential (primary) hypertension: Secondary | ICD-10-CM | POA: Diagnosis not present

## 2017-12-11 DIAGNOSIS — E039 Hypothyroidism, unspecified: Secondary | ICD-10-CM | POA: Diagnosis not present

## 2017-12-11 DIAGNOSIS — M810 Age-related osteoporosis without current pathological fracture: Secondary | ICD-10-CM | POA: Diagnosis not present

## 2017-12-11 DIAGNOSIS — E1151 Type 2 diabetes mellitus with diabetic peripheral angiopathy without gangrene: Secondary | ICD-10-CM | POA: Diagnosis not present

## 2017-12-15 DIAGNOSIS — E1151 Type 2 diabetes mellitus with diabetic peripheral angiopathy without gangrene: Secondary | ICD-10-CM | POA: Diagnosis not present

## 2017-12-15 DIAGNOSIS — E43 Unspecified severe protein-calorie malnutrition: Secondary | ICD-10-CM | POA: Diagnosis not present

## 2017-12-15 DIAGNOSIS — Z9181 History of falling: Secondary | ICD-10-CM | POA: Diagnosis not present

## 2017-12-15 DIAGNOSIS — S32512D Fracture of superior rim of left pubis, subsequent encounter for fracture with routine healing: Secondary | ICD-10-CM | POA: Diagnosis not present

## 2017-12-15 DIAGNOSIS — E039 Hypothyroidism, unspecified: Secondary | ICD-10-CM | POA: Diagnosis not present

## 2017-12-15 DIAGNOSIS — M810 Age-related osteoporosis without current pathological fracture: Secondary | ICD-10-CM | POA: Diagnosis not present

## 2017-12-15 DIAGNOSIS — J45909 Unspecified asthma, uncomplicated: Secondary | ICD-10-CM | POA: Diagnosis not present

## 2017-12-15 DIAGNOSIS — I10 Essential (primary) hypertension: Secondary | ICD-10-CM | POA: Diagnosis not present

## 2017-12-16 ENCOUNTER — Ambulatory Visit (INDEPENDENT_AMBULATORY_CARE_PROVIDER_SITE_OTHER): Payer: Medicare Other

## 2017-12-16 DIAGNOSIS — E538 Deficiency of other specified B group vitamins: Secondary | ICD-10-CM | POA: Diagnosis not present

## 2017-12-16 MED ORDER — CYANOCOBALAMIN 1000 MCG/ML IJ SOLN
1000.0000 ug | Freq: Once | INTRAMUSCULAR | Status: AC
  Administered 2017-12-16: 1000 ug via INTRAMUSCULAR

## 2017-12-16 NOTE — Progress Notes (Addendum)
Patient given B12 injection in left deltoid. Patient tolerated well.  Reviewed.  Dr Nicki Reaper

## 2017-12-17 DIAGNOSIS — S32512D Fracture of superior rim of left pubis, subsequent encounter for fracture with routine healing: Secondary | ICD-10-CM | POA: Diagnosis not present

## 2017-12-17 DIAGNOSIS — J45909 Unspecified asthma, uncomplicated: Secondary | ICD-10-CM | POA: Diagnosis not present

## 2017-12-17 DIAGNOSIS — I10 Essential (primary) hypertension: Secondary | ICD-10-CM | POA: Diagnosis not present

## 2017-12-17 DIAGNOSIS — E039 Hypothyroidism, unspecified: Secondary | ICD-10-CM | POA: Diagnosis not present

## 2017-12-17 DIAGNOSIS — Z9181 History of falling: Secondary | ICD-10-CM | POA: Diagnosis not present

## 2017-12-17 DIAGNOSIS — E43 Unspecified severe protein-calorie malnutrition: Secondary | ICD-10-CM | POA: Diagnosis not present

## 2017-12-17 DIAGNOSIS — E1151 Type 2 diabetes mellitus with diabetic peripheral angiopathy without gangrene: Secondary | ICD-10-CM | POA: Diagnosis not present

## 2017-12-17 DIAGNOSIS — M810 Age-related osteoporosis without current pathological fracture: Secondary | ICD-10-CM | POA: Diagnosis not present

## 2017-12-18 ENCOUNTER — Telehealth: Payer: Self-pay | Admitting: Internal Medicine

## 2017-12-18 DIAGNOSIS — I10 Essential (primary) hypertension: Secondary | ICD-10-CM | POA: Diagnosis not present

## 2017-12-18 DIAGNOSIS — E1151 Type 2 diabetes mellitus with diabetic peripheral angiopathy without gangrene: Secondary | ICD-10-CM | POA: Diagnosis not present

## 2017-12-18 DIAGNOSIS — E039 Hypothyroidism, unspecified: Secondary | ICD-10-CM | POA: Diagnosis not present

## 2017-12-18 DIAGNOSIS — J45909 Unspecified asthma, uncomplicated: Secondary | ICD-10-CM | POA: Diagnosis not present

## 2017-12-18 DIAGNOSIS — M810 Age-related osteoporosis without current pathological fracture: Secondary | ICD-10-CM | POA: Diagnosis not present

## 2017-12-18 DIAGNOSIS — E43 Unspecified severe protein-calorie malnutrition: Secondary | ICD-10-CM | POA: Diagnosis not present

## 2017-12-18 DIAGNOSIS — S32512D Fracture of superior rim of left pubis, subsequent encounter for fracture with routine healing: Secondary | ICD-10-CM | POA: Diagnosis not present

## 2017-12-18 DIAGNOSIS — Z9181 History of falling: Secondary | ICD-10-CM | POA: Diagnosis not present

## 2017-12-18 NOTE — Telephone Encounter (Signed)
Please advise 

## 2017-12-18 NOTE — Telephone Encounter (Signed)
Copied from [place] [id number]. Topic: Quick Communication - See Telephone Encounter >> Dec 18, 2017 10:48 AM Rutherford Nail, [place] wrote: CRM for notification. See Telephone encounter for: 12/18/17. Amy with [facility] calling and states that she just saw the patient for her [place] nurse visit. They were talking about the patient's pain. Amy states that at rehab at [facility] the patient had voltarin get prescribed.  States to Amy that it helped for pain when the Tylenol didn't. Amy states that the patient is almost out of the voltarin gel. Wanted to see if Dr Nicki Reaper could write her a prescription for generic voltaren gel and send it to the pharmacy? CB#: [phone]  [facility] - [address], [place]

## 2017-12-19 ENCOUNTER — Other Ambulatory Visit: Payer: Self-pay | Admitting: Internal Medicine

## 2017-12-22 DIAGNOSIS — I10 Essential (primary) hypertension: Secondary | ICD-10-CM | POA: Diagnosis not present

## 2017-12-22 DIAGNOSIS — J45909 Unspecified asthma, uncomplicated: Secondary | ICD-10-CM | POA: Diagnosis not present

## 2017-12-22 DIAGNOSIS — E43 Unspecified severe protein-calorie malnutrition: Secondary | ICD-10-CM | POA: Diagnosis not present

## 2017-12-22 DIAGNOSIS — Z9181 History of falling: Secondary | ICD-10-CM | POA: Diagnosis not present

## 2017-12-22 DIAGNOSIS — M810 Age-related osteoporosis without current pathological fracture: Secondary | ICD-10-CM | POA: Diagnosis not present

## 2017-12-22 DIAGNOSIS — E1151 Type 2 diabetes mellitus with diabetic peripheral angiopathy without gangrene: Secondary | ICD-10-CM | POA: Diagnosis not present

## 2017-12-22 DIAGNOSIS — S32512D Fracture of superior rim of left pubis, subsequent encounter for fracture with routine healing: Secondary | ICD-10-CM | POA: Diagnosis not present

## 2017-12-22 DIAGNOSIS — E039 Hypothyroidism, unspecified: Secondary | ICD-10-CM | POA: Diagnosis not present

## 2017-12-22 NOTE — Telephone Encounter (Signed)
Ok to send in the Diclofenac gel for patient to use PRN?

## 2017-12-23 ENCOUNTER — Ambulatory Visit (INDEPENDENT_AMBULATORY_CARE_PROVIDER_SITE_OTHER): Payer: Medicare Other | Admitting: *Deleted

## 2017-12-23 DIAGNOSIS — E538 Deficiency of other specified B group vitamins: Secondary | ICD-10-CM | POA: Diagnosis not present

## 2017-12-23 MED ORDER — CYANOCOBALAMIN 1000 MCG/ML IJ SOLN
1000.0000 ug | Freq: Once | INTRAMUSCULAR | Status: AC
  Administered 2017-12-23: 1000 ug via INTRAMUSCULAR

## 2017-12-23 MED ORDER — DICLOFENAC SODIUM 1 % TD GEL: 4.0000 g | Freq: Three times a day (TID) | TRANSDERMAL | 0 refills | Status: DC | PRN

## 2017-12-23 NOTE — Telephone Encounter (Signed)
Spoke with patient regarding gel while she was here for her B12 injection. Advised to spot check pressures.

## 2017-12-23 NOTE — Telephone Encounter (Signed)
I have sent in rx for voltaren gel.  Will need to continue to spot check blood pressure to confirm ok.

## 2017-12-23 NOTE — Progress Notes (Addendum)
Patient presented for B 12 injection to right deltoid, patient voiced no concerns nor showed any signs of distress during injection.  Reviewed.  Dr Scott 

## 2017-12-24 DIAGNOSIS — E43 Unspecified severe protein-calorie malnutrition: Secondary | ICD-10-CM | POA: Diagnosis not present

## 2017-12-24 DIAGNOSIS — E1151 Type 2 diabetes mellitus with diabetic peripheral angiopathy without gangrene: Secondary | ICD-10-CM | POA: Diagnosis not present

## 2017-12-24 DIAGNOSIS — I10 Essential (primary) hypertension: Secondary | ICD-10-CM | POA: Diagnosis not present

## 2017-12-24 DIAGNOSIS — J45909 Unspecified asthma, uncomplicated: Secondary | ICD-10-CM | POA: Diagnosis not present

## 2017-12-24 DIAGNOSIS — Z9181 History of falling: Secondary | ICD-10-CM | POA: Diagnosis not present

## 2017-12-24 DIAGNOSIS — M810 Age-related osteoporosis without current pathological fracture: Secondary | ICD-10-CM | POA: Diagnosis not present

## 2017-12-24 DIAGNOSIS — E039 Hypothyroidism, unspecified: Secondary | ICD-10-CM | POA: Diagnosis not present

## 2017-12-24 DIAGNOSIS — S32512D Fracture of superior rim of left pubis, subsequent encounter for fracture with routine healing: Secondary | ICD-10-CM | POA: Diagnosis not present

## 2017-12-25 DIAGNOSIS — J45909 Unspecified asthma, uncomplicated: Secondary | ICD-10-CM | POA: Diagnosis not present

## 2017-12-25 DIAGNOSIS — S32512D Fracture of superior rim of left pubis, subsequent encounter for fracture with routine healing: Secondary | ICD-10-CM | POA: Diagnosis not present

## 2017-12-25 DIAGNOSIS — I10 Essential (primary) hypertension: Secondary | ICD-10-CM | POA: Diagnosis not present

## 2017-12-25 DIAGNOSIS — E43 Unspecified severe protein-calorie malnutrition: Secondary | ICD-10-CM | POA: Diagnosis not present

## 2017-12-25 DIAGNOSIS — E1151 Type 2 diabetes mellitus with diabetic peripheral angiopathy without gangrene: Secondary | ICD-10-CM | POA: Diagnosis not present

## 2017-12-25 DIAGNOSIS — E039 Hypothyroidism, unspecified: Secondary | ICD-10-CM | POA: Diagnosis not present

## 2017-12-25 DIAGNOSIS — Z9181 History of falling: Secondary | ICD-10-CM | POA: Diagnosis not present

## 2017-12-25 DIAGNOSIS — M810 Age-related osteoporosis without current pathological fracture: Secondary | ICD-10-CM | POA: Diagnosis not present

## 2017-12-30 ENCOUNTER — Telehealth: Payer: Self-pay | Admitting: Radiology

## 2017-12-30 ENCOUNTER — Other Ambulatory Visit: Payer: Self-pay | Admitting: Internal Medicine

## 2017-12-30 DIAGNOSIS — D649 Anemia, unspecified: Secondary | ICD-10-CM

## 2017-12-30 NOTE — Progress Notes (Signed)
Order placed for f/u cbc.   

## 2017-12-30 NOTE — Telephone Encounter (Signed)
Pt coming in for labs tomorrow, please place future orders. Thank you.  

## 2017-12-30 NOTE — Telephone Encounter (Signed)
Order placed for f/u cbc.   

## 2017-12-31 ENCOUNTER — Other Ambulatory Visit (INDEPENDENT_AMBULATORY_CARE_PROVIDER_SITE_OTHER): Payer: Medicare Other

## 2017-12-31 ENCOUNTER — Ambulatory Visit (INDEPENDENT_AMBULATORY_CARE_PROVIDER_SITE_OTHER): Payer: Medicare Other | Admitting: *Deleted

## 2017-12-31 DIAGNOSIS — D649 Anemia, unspecified: Secondary | ICD-10-CM | POA: Diagnosis not present

## 2017-12-31 DIAGNOSIS — E538 Deficiency of other specified B group vitamins: Secondary | ICD-10-CM | POA: Diagnosis not present

## 2017-12-31 LAB — CBC WITH DIFFERENTIAL/PLATELET
BASOS ABS: 0 10*3/uL (ref 0.0–0.1)
Basophils Relative: 0.6 % (ref 0.0–3.0)
EOS ABS: 0.2 10*3/uL (ref 0.0–0.7)
Eosinophils Relative: 2.8 % (ref 0.0–5.0)
HCT: 33.5 % — ABNORMAL LOW (ref 36.0–46.0)
Hemoglobin: 10.9 g/dL — ABNORMAL LOW (ref 12.0–15.0)
LYMPHS ABS: 2.6 10*3/uL (ref 0.7–4.0)
Lymphocytes Relative: 33.4 % (ref 12.0–46.0)
MCHC: 32.4 g/dL (ref 30.0–36.0)
MCV: 93.7 fl (ref 78.0–100.0)
MONO ABS: 0.6 10*3/uL (ref 0.1–1.0)
MONOS PCT: 8.3 % (ref 3.0–12.0)
NEUTROS PCT: 54.9 % (ref 43.0–77.0)
Neutro Abs: 4.2 10*3/uL (ref 1.4–7.7)
PLATELETS: 288 10*3/uL (ref 150.0–400.0)
RBC: 3.58 Mil/uL — AB (ref 3.87–5.11)
RDW: 16.5 % — ABNORMAL HIGH (ref 11.5–15.5)
WBC: 7.7 10*3/uL (ref 4.0–10.5)

## 2018-01-01 MED ORDER — CYANOCOBALAMIN 1000 MCG/ML IJ SOLN
1000.0000 ug | Freq: Once | INTRAMUSCULAR | Status: AC
  Administered 2017-12-31: 1000 ug via INTRAMUSCULAR

## 2018-01-01 NOTE — Progress Notes (Signed)
Patient presented for B 12 injection to right deltoid, patient voiced no concerns nor showed any signs of distress during injection. 

## 2018-01-07 ENCOUNTER — Ambulatory Visit (INDEPENDENT_AMBULATORY_CARE_PROVIDER_SITE_OTHER): Payer: Medicare Other | Admitting: *Deleted

## 2018-01-07 ENCOUNTER — Other Ambulatory Visit: Payer: Self-pay | Admitting: Internal Medicine

## 2018-01-07 DIAGNOSIS — E538 Deficiency of other specified B group vitamins: Secondary | ICD-10-CM | POA: Diagnosis not present

## 2018-01-07 MED ORDER — CYANOCOBALAMIN 1000 MCG/ML IJ SOLN: 1000.0000 ug | Freq: Once | INTRAMUSCULAR | Status: DC

## 2018-01-07 MED ORDER — CYANOCOBALAMIN 1000 MCG/ML IJ SOLN
1000.0000 ug | Freq: Once | INTRAMUSCULAR | Status: AC
  Administered 2018-01-07: 1000 ug via INTRAMUSCULAR

## 2018-01-07 NOTE — Progress Notes (Signed)
Patient presented for B 12 injection to left deltoid, patient voiced no concerns nor showed any signs of distress during injection. 

## 2018-01-08 ENCOUNTER — Ambulatory Visit: Payer: Self-pay | Admitting: Internal Medicine

## 2018-02-04 DIAGNOSIS — B351 Tinea unguium: Secondary | ICD-10-CM | POA: Diagnosis not present

## 2018-02-04 DIAGNOSIS — E1159 Type 2 diabetes mellitus with other circulatory complications: Secondary | ICD-10-CM | POA: Diagnosis not present

## 2018-02-09 ENCOUNTER — Ambulatory Visit: Payer: Self-pay

## 2018-02-10 ENCOUNTER — Ambulatory Visit: Payer: Self-pay | Admitting: Internal Medicine

## 2018-02-10 ENCOUNTER — Other Ambulatory Visit: Payer: Self-pay | Admitting: Internal Medicine

## 2018-02-10 ENCOUNTER — Ambulatory Visit (INDEPENDENT_AMBULATORY_CARE_PROVIDER_SITE_OTHER): Payer: Medicare Other

## 2018-02-10 DIAGNOSIS — E538 Deficiency of other specified B group vitamins: Secondary | ICD-10-CM

## 2018-02-10 MED ORDER — CYANOCOBALAMIN 1000 MCG/ML IJ SOLN
1000.0000 ug | Freq: Once | INTRAMUSCULAR | Status: AC
  Administered 2018-02-10: 1000 ug via INTRAMUSCULAR

## 2018-02-10 NOTE — Progress Notes (Signed)
Patient comes in for B 12 injection.  Injected left deltoid.  Patient tolerated injection well.  

## 2018-02-14 NOTE — Progress Notes (Signed)
  I have reviewed the above information and agree with above.   Paradise Vensel, MD 

## 2018-02-17 ENCOUNTER — Telehealth: Payer: Self-pay

## 2018-02-17 NOTE — Telephone Encounter (Signed)
I have called [facility] to clarify patient's med list. They have patient taking Lantus insulin. I left a VM asking that Ruthe Mannan give [place] a call back.

## 2018-02-24 ENCOUNTER — Telehealth: Payer: Self-pay

## 2018-02-24 NOTE — Telephone Encounter (Signed)
Copied from [place] [id number]. Topic: Inquiry >> Feb 24, 2018 10:46 AM Vernona Rieger wrote: Reason for CRM: Anderson Malta from [facility] called and said she sent a initial plan of care form to Dr Nicki Reaper on 6/24 and has not received anything back. Please call Anderson Malta @ [phone] ext [phone]

## 2018-02-26 NOTE — Telephone Encounter (Signed)
Left message for jennifer to call back

## 2018-03-01 ENCOUNTER — Encounter: Payer: Self-pay | Admitting: *Deleted

## 2018-03-01 ENCOUNTER — Other Ambulatory Visit: Payer: Self-pay

## 2018-03-01 ENCOUNTER — Inpatient Hospital Stay
Admission: EM | Admit: 2018-03-01 | Discharge: 2018-03-16 | DRG: 350 | Disposition: A | Discharge status: Skilled Nursing Facility | Payer: Medicare Other | Attending: Internal Medicine | Admitting: Internal Medicine

## 2018-03-01 ENCOUNTER — Emergency Department: Payer: Medicare Other

## 2018-03-01 DIAGNOSIS — K56699 Other intestinal obstruction unspecified as to partial versus complete obstruction: Secondary | ICD-10-CM | POA: Diagnosis not present

## 2018-03-01 DIAGNOSIS — R600 Localized edema: Secondary | ICD-10-CM

## 2018-03-01 DIAGNOSIS — H9190 Unspecified hearing loss, unspecified ear: Secondary | ICD-10-CM | POA: Diagnosis present

## 2018-03-01 DIAGNOSIS — D72829 Elevated white blood cell count, unspecified: Secondary | ICD-10-CM

## 2018-03-01 DIAGNOSIS — R109 Unspecified abdominal pain: Secondary | ICD-10-CM | POA: Diagnosis present

## 2018-03-01 DIAGNOSIS — R1312 Dysphagia, oropharyngeal phase: Secondary | ICD-10-CM | POA: Diagnosis not present

## 2018-03-01 DIAGNOSIS — E876 Hypokalemia: Secondary | ICD-10-CM | POA: Diagnosis not present

## 2018-03-01 DIAGNOSIS — I161 Hypertensive emergency: Secondary | ICD-10-CM | POA: Diagnosis present

## 2018-03-01 DIAGNOSIS — J9601 Acute respiratory failure with hypoxia: Secondary | ICD-10-CM | POA: Diagnosis not present

## 2018-03-01 DIAGNOSIS — K567 Ileus, unspecified: Secondary | ICD-10-CM

## 2018-03-01 DIAGNOSIS — K660 Peritoneal adhesions (postprocedural) (postinfection): Secondary | ICD-10-CM | POA: Diagnosis present

## 2018-03-01 DIAGNOSIS — I10 Essential (primary) hypertension: Secondary | ICD-10-CM

## 2018-03-01 DIAGNOSIS — I160 Hypertensive urgency: Secondary | ICD-10-CM | POA: Diagnosis present

## 2018-03-01 DIAGNOSIS — K8020 Calculus of gallbladder without cholecystitis without obstruction: Secondary | ICD-10-CM | POA: Diagnosis present

## 2018-03-01 DIAGNOSIS — M810 Age-related osteoporosis without current pathological fracture: Secondary | ICD-10-CM | POA: Diagnosis not present

## 2018-03-01 DIAGNOSIS — K4130 Unilateral femoral hernia, with obstruction, without gangrene, not specified as recurrent: Secondary | ICD-10-CM | POA: Diagnosis not present

## 2018-03-01 DIAGNOSIS — Z7189 Other specified counseling: Secondary | ICD-10-CM | POA: Diagnosis not present

## 2018-03-01 DIAGNOSIS — J811 Chronic pulmonary edema: Secondary | ICD-10-CM | POA: Diagnosis present

## 2018-03-01 DIAGNOSIS — Z801 Family history of malignant neoplasm of trachea, bronchus and lung: Secondary | ICD-10-CM

## 2018-03-01 DIAGNOSIS — Z8249 Family history of ischemic heart disease and other diseases of the circulatory system: Secondary | ICD-10-CM

## 2018-03-01 DIAGNOSIS — Z515 Encounter for palliative care: Secondary | ICD-10-CM | POA: Diagnosis not present

## 2018-03-01 DIAGNOSIS — E43 Unspecified severe protein-calorie malnutrition: Secondary | ICD-10-CM | POA: Diagnosis not present

## 2018-03-01 DIAGNOSIS — I509 Heart failure, unspecified: Secondary | ICD-10-CM

## 2018-03-01 DIAGNOSIS — E1165 Type 2 diabetes mellitus with hyperglycemia: Secondary | ICD-10-CM | POA: Diagnosis present

## 2018-03-01 DIAGNOSIS — J9811 Atelectasis: Secondary | ICD-10-CM | POA: Diagnosis present

## 2018-03-01 DIAGNOSIS — Z880 Allergy status to penicillin: Secondary | ICD-10-CM

## 2018-03-01 DIAGNOSIS — Z79899 Other long term (current) drug therapy: Secondary | ICD-10-CM

## 2018-03-01 DIAGNOSIS — J90 Pleural effusion, not elsewhere classified: Secondary | ICD-10-CM | POA: Diagnosis present

## 2018-03-01 DIAGNOSIS — Z4659 Encounter for fitting and adjustment of other gastrointestinal appliance and device: Secondary | ICD-10-CM

## 2018-03-01 DIAGNOSIS — I517 Cardiomegaly: Secondary | ICD-10-CM | POA: Diagnosis not present

## 2018-03-01 DIAGNOSIS — J45909 Unspecified asthma, uncomplicated: Secondary | ICD-10-CM | POA: Diagnosis not present

## 2018-03-01 DIAGNOSIS — R0602 Shortness of breath: Secondary | ICD-10-CM

## 2018-03-01 DIAGNOSIS — E119 Type 2 diabetes mellitus without complications: Secondary | ICD-10-CM | POA: Diagnosis not present

## 2018-03-01 DIAGNOSIS — E8770 Fluid overload, unspecified: Secondary | ICD-10-CM | POA: Diagnosis not present

## 2018-03-01 DIAGNOSIS — Z9851 Tubal ligation status: Secondary | ICD-10-CM

## 2018-03-01 DIAGNOSIS — M109 Gout, unspecified: Secondary | ICD-10-CM | POA: Diagnosis present

## 2018-03-01 DIAGNOSIS — E039 Hypothyroidism, unspecified: Secondary | ICD-10-CM | POA: Diagnosis present

## 2018-03-01 DIAGNOSIS — K56609 Unspecified intestinal obstruction, unspecified as to partial versus complete obstruction: Secondary | ICD-10-CM | POA: Diagnosis not present

## 2018-03-01 DIAGNOSIS — Z5331 Laparoscopic surgical procedure converted to open procedure: Secondary | ICD-10-CM | POA: Diagnosis not present

## 2018-03-01 DIAGNOSIS — Z9181 History of falling: Secondary | ICD-10-CM | POA: Diagnosis not present

## 2018-03-01 DIAGNOSIS — Z888 Allergy status to other drugs, medicaments and biological substances status: Secondary | ICD-10-CM

## 2018-03-01 DIAGNOSIS — E860 Dehydration: Secondary | ICD-10-CM | POA: Diagnosis not present

## 2018-03-01 DIAGNOSIS — M6281 Muscle weakness (generalized): Secondary | ICD-10-CM | POA: Diagnosis not present

## 2018-03-01 DIAGNOSIS — I119 Hypertensive heart disease without heart failure: Secondary | ICD-10-CM | POA: Diagnosis present

## 2018-03-01 DIAGNOSIS — I700 Atherosclerosis of aorta: Secondary | ICD-10-CM | POA: Diagnosis not present

## 2018-03-01 DIAGNOSIS — R05 Cough: Secondary | ICD-10-CM | POA: Diagnosis not present

## 2018-03-01 DIAGNOSIS — Z7982 Long term (current) use of aspirin: Secondary | ICD-10-CM | POA: Diagnosis not present

## 2018-03-01 DIAGNOSIS — Z79890 Hormone replacement therapy (postmenopausal): Secondary | ICD-10-CM

## 2018-03-01 DIAGNOSIS — R0902 Hypoxemia: Secondary | ICD-10-CM

## 2018-03-01 DIAGNOSIS — J810 Acute pulmonary edema: Secondary | ICD-10-CM | POA: Diagnosis not present

## 2018-03-01 DIAGNOSIS — R262 Difficulty in walking, not elsewhere classified: Secondary | ICD-10-CM | POA: Diagnosis not present

## 2018-03-01 DIAGNOSIS — Z9049 Acquired absence of other specified parts of digestive tract: Secondary | ICD-10-CM

## 2018-03-01 DIAGNOSIS — Z681 Body mass index (BMI) 19 or less, adult: Secondary | ICD-10-CM

## 2018-03-01 DIAGNOSIS — N179 Acute kidney failure, unspecified: Secondary | ICD-10-CM | POA: Diagnosis not present

## 2018-03-01 DIAGNOSIS — K4030 Unilateral inguinal hernia, with obstruction, without gangrene, not specified as recurrent: Secondary | ICD-10-CM | POA: Diagnosis not present

## 2018-03-01 DIAGNOSIS — D649 Anemia, unspecified: Secondary | ICD-10-CM | POA: Diagnosis present

## 2018-03-01 DIAGNOSIS — Z48815 Encounter for surgical aftercare following surgery on the digestive system: Secondary | ICD-10-CM | POA: Diagnosis not present

## 2018-03-01 DIAGNOSIS — R059 Cough, unspecified: Secondary | ICD-10-CM

## 2018-03-01 DIAGNOSIS — R509 Fever, unspecified: Secondary | ICD-10-CM

## 2018-03-01 DIAGNOSIS — E1151 Type 2 diabetes mellitus with diabetic peripheral angiopathy without gangrene: Secondary | ICD-10-CM | POA: Diagnosis not present

## 2018-03-01 DIAGNOSIS — E46 Unspecified protein-calorie malnutrition: Secondary | ICD-10-CM | POA: Diagnosis not present

## 2018-03-01 DIAGNOSIS — Z973 Presence of spectacles and contact lenses: Secondary | ICD-10-CM

## 2018-03-01 DIAGNOSIS — Z4682 Encounter for fitting and adjustment of non-vascular catheter: Secondary | ICD-10-CM | POA: Diagnosis not present

## 2018-03-01 DIAGNOSIS — Z7401 Bed confinement status: Secondary | ICD-10-CM | POA: Diagnosis not present

## 2018-03-01 LAB — URINALYSIS, COMPLETE (UACMP) WITH MICROSCOPIC
Bacteria, UA: NONE SEEN
Bilirubin Urine: NEGATIVE
Glucose, UA: 50 mg/dL — AB
Hgb urine dipstick: NEGATIVE
Ketones, ur: NEGATIVE mg/dL
Leukocytes, UA: NEGATIVE
Nitrite: NEGATIVE
Protein, ur: >=300 mg/dL — AB
Specific Gravity, Urine: 1.019 (ref 1.005–1.030)
pH: 5 (ref 5.0–8.0)

## 2018-03-01 LAB — COMPREHENSIVE METABOLIC PANEL
ALT: 15 U/L (ref 0–44)
AST: 34 U/L (ref 15–41)
Albumin: 3.4 g/dL — ABNORMAL LOW (ref 3.5–5.0)
Alkaline Phosphatase: 45 U/L (ref 38–126)
Anion gap: 7 (ref 5–15)
BUN: 31 mg/dL — ABNORMAL HIGH (ref 8–23)
CO2: 26 mmol/L (ref 22–32)
CREATININE: 0.96 mg/dL (ref 0.44–1.00)
Calcium: 8.9 mg/dL (ref 8.9–10.3)
Chloride: 106 mmol/L (ref 98–111)
GFR, EST AFRICAN AMERICAN: 55 mL/min — AB (ref >60)
GFR, EST NON AFRICAN AMERICAN: 48 mL/min — AB (ref >60)
Glucose, Bld: 234 mg/dL — ABNORMAL HIGH (ref 70–99)
POTASSIUM: 4.4 mmol/L (ref 3.5–5.1)
Sodium: 139 mmol/L (ref 135–145)
Total Bilirubin: 0.5 mg/dL (ref 0.3–1.2)
Total Protein: 7.9 g/dL (ref 6.5–8.1)

## 2018-03-01 LAB — CBC
HEMATOCRIT: 36.2 % (ref 35.0–47.0)
Hemoglobin: 12.1 g/dL (ref 12.0–16.0)
MCH: 30.5 pg (ref 26.0–34.0)
MCHC: 33.3 g/dL (ref 32.0–36.0)
MCV: 91.7 fL (ref 80.0–100.0)
PLATELETS: 257 10*3/uL (ref 150–440)
RBC: 3.95 MIL/uL (ref 3.80–5.20)
RDW: 16.4 % — ABNORMAL HIGH (ref 11.5–14.5)
WBC: 7 10*3/uL (ref 3.6–11.0)

## 2018-03-01 LAB — TROPONIN I: Troponin I: 0.04 ng/mL (ref <0.03)

## 2018-03-01 LAB — LIPASE, BLOOD: LIPASE: 42 U/L (ref 11–51)

## 2018-03-01 MED ORDER — ONDANSETRON HCL 4 MG/2ML IJ SOLN
4.0000 mg | Freq: Once | INTRAMUSCULAR | Status: AC
  Administered 2018-03-01: 4 mg via INTRAVENOUS
  Filled 2018-03-01: qty 2

## 2018-03-01 MED ORDER — MORPHINE SULFATE (PF) 2 MG/ML IV SOLN
2.0000 mg | Freq: Once | INTRAVENOUS | Status: AC
  Administered 2018-03-01: 2 mg via INTRAVENOUS
  Filled 2018-03-01: qty 1

## 2018-03-01 MED ORDER — IOPAMIDOL (ISOVUE-370) INJECTION 76%
75.0000 mL | Freq: Once | INTRAVENOUS | Status: AC | PRN
  Administered 2018-03-01: 75 mL via INTRAVENOUS

## 2018-03-01 NOTE — ED Triage Notes (Signed)
Pt to ED reporting generalized abd pain that started this evening. Pt took a laxitive yesterday and was able to have a  BM today. No vomiting but nausea reported earlier this evening. Pt denies tenderness to abd but it rubbing stomach in triage.

## 2018-03-01 NOTE — ED Provider Notes (Signed)
[facility] Emergency Department Provider Note   ____________________________________________   First MD Initiated Contact with Patient 03/01/18 2306     (approximate)  I have reviewed the triage vital signs and the nursing notes.   HISTORY  Chief Complaint Abdominal Pain    HPI Tara [name] is a 84 y.o. female who comes into the hospital today with some abdominal pain.  The patient's pain started around 6 PM.  The pain she states is in her lower abdomen.  She had a normal bowel movement today but she states that it was soft because she took a laxative yesterday.  She denies any vomiting but she is felt nauseous.  She denies any fevers.  She rates her pain a 9 out of 10 in intensity.  The patient denies any pain with urination.  She states that she had similar pain when she had a hernia but did not have any surgery for it.  The patient took some Tylenol at home for pain but since her pain persisted she decided to come in for evaluation.   Past Medical History:  Diagnosis Date  . Asthma   . Diabetes mellitus ([place])   . Environmental allergies   . Hypertension     Patient Active Problem List   Diagnosis Date Noted  . Abdominal pain 03/02/2018  . Hypertensive urgency, malignant 03/02/2018  . Difficulty breathing 11/03/2017  . Knee effusion, right 11/03/2017  . Fracture of superior rim of left pubis, subsequent encounter for fracture with routine healing 11/03/2017  . History of falling 11/03/2017  . Protein-calorie malnutrition, severe 11/01/2017  . Hypertensive urgency 10/31/2017  . Bilateral lower extremity edema 09/07/2017  . Right hand pain 08/25/2017  . Leg pain 08/25/2017  . Right groin hernia 07/03/2017  . Abnormal CT of the abdomen 07/03/2017  . Allergic rhinitis 06/17/2017  . Osteoporosis 03/12/2017  . Nasal obstruction 09/03/2016  . Loss of weight 03/05/2015  . Unsteady gait 07/31/2013  . Lower extremity edema 05/15/2013  .  Peripheral vascular disease ([place]) 08/29/2012  . Hypertension 06/11/2012  . Hypercholesteremia 06/11/2012  . Diabetes mellitus with peripheral vascular disease ([place]) 06/11/2012  . Anemia 06/11/2012  . B12 deficiency 06/11/2012  . Hypothyroidism 06/11/2012    Past Surgical History:  Procedure Laterality Date  . APPENDECTOMY    . TUBAL LIGATION      Prior to Admission medications   Medication Sig Start Date End Date Taking? Authorizing Provider  acetaminophen (TYLENOL) 325 MG tablet Take 650 mg by mouth every 6 (six) hours as needed for mild pain.    Yes [provider]  amLODipine (NORVASC) 5 MG tablet Take 5 mg by mouth daily.   Yes [provider]  aspirin (ASPIRIN 81) 81 MG EC tablet Take 81 mg by mouth daily. Swallow whole.   Yes [provider]  Calcium Carbonate-Vitamin D (CALTRATE 600+D PO) Take 1 tablet by mouth daily.    Yes [provider]  diclofenac sodium (VOLTAREN) 1 % GEL Apply 4 g topically 3 (three) times daily as needed. Apply to the Right Knee for effusion 12/23/17  Yes Einar Pheasant, MD  fexofenadine (ALLEGRA) 180 MG tablet Take 1 tablet (180 mg total) by mouth daily. 02/10/18  Yes Einar Pheasant, MD  fluticasone (FLONASE) 50 MCG/ACT nasal spray Place into both nostrils daily.   Yes [provider]  hydrALAZINE (APRESOLINE) 50 MG tablet TAKE 1 TABLET BY MOUTH 3 TIMES A DAY 02/10/18  Yes Einar Pheasant, MD  metoprolol succinate (TOPROL-XL) 25 MG 24 hr tablet Take 1 tablet (25 mg total) by mouth 2 (two) times daily. 12/21/17  Yes Einar Pheasant, MD  SYNTHROID 50 MCG tablet TAKE ONE (1) TABLET EACH DAY 09/24/17  Yes Einar Pheasant, MD  Amino Acids-Protein Hydrolys (FEEDING SUPPLEMENT, PRO-STAT SUGAR FREE 64,) LIQD Take 30 mLs by mouth 2 (two) times daily between meals.    [provider]  glucose blood (ACCU-CHEK AVIVA PLUS) test strip CHECK SUGAR ONCE DAILY 09/24/15   Einar Pheasant, MD  metoprolol tartrate (LOPRESSOR)  25 MG tablet Take 25 mg by mouth 2 (two) times daily.    [provider]    Allergies Ace inhibitors; Hydrochlorothiazide w-triamterene; Lisinopril; Penicillins; and Trichlormethiazide  Family History  Problem Relation Age of Onset  . Hypertension Mother   . Cancer Brother        lung (question)  . Cancer Sister        questionable type    Social History Social History   Tobacco Use  . Smoking status: Never Smoker  . Smokeless tobacco: Never Used  Substance Use Topics  . Alcohol use: No    Alcohol/week: 0.0 oz  . Drug use: No    Review of Systems  Constitutional: No fever/chills Eyes: No visual changes. ENT: No sore throat. Cardiovascular: Denies chest pain. Respiratory: Denies shortness of breath. Gastrointestinal:  abdominal pain.   nausea, no vomiting.  No diarrhea.  No constipation. Genitourinary: Negative for dysuria. Musculoskeletal: Negative for back pain. Skin: Negative for rash. Neurological: Negative for headaches, focal weakness or numbness.   ____________________________________________   PHYSICAL EXAM:  VITAL SIGNS: ED Triage Vitals  Enc Vitals Group     BP 03/01/18 2246 (!) 243/75     Pulse Rate 03/01/18 2246 66     Resp 03/01/18 2246 16     Temp 03/01/18 2246 98.2 F (36.8 C)     Temp Source 03/01/18 2246 Oral     SpO2 03/01/18 2246 99 %     Weight 03/01/18 2247 96 lb (43.5 kg)     Height --      Head Circumference --      Peak Flow --      Pain Score 03/01/18 2246 9     Pain Loc --      Pain Edu? --      Excl. in [place]? --     Constitutional: Alert and oriented. Well appearing and in moderate distress. Eyes: Conjunctivae are normal. PERRL. EOMI. Head: Atraumatic. Nose: No congestion/rhinnorhea. Mouth/Throat: Mucous membranes are moist.  Oropharynx non-erythematous. Cardiovascular: Normal rate, regular rhythm.  Systolic murmur.  Good peripheral circulation. Respiratory: Normal respiratory effort.  No retractions. Lungs  CTAB. Gastrointestinal: Soft with some diffuse abdominal tenderness to palpation worse on the right lower quadrant. No distention. No abdominal bruits.  Positive bowel sounds. Musculoskeletal: Bilateral lower extremity edema.   Neurologic:  Normal speech and language.  Skin:  Skin is warm, dry and intact.  Psychiatric: Mood and affect are normal.   ____________________________________________   LABS (all labs ordered are listed, but only abnormal results are displayed)  Labs Reviewed  COMPREHENSIVE METABOLIC PANEL - Abnormal; Notable for the following components:      Result Value   Glucose, Bld 234 (*)    BUN 31 (*)    Albumin 3.4 (*)    GFR calc non Af Amer 48 (*)    GFR calc Af Amer 55 (*)    All other components within normal  limits  CBC - Abnormal; Notable for the following components:   RDW 16.4 (*)    All other components within normal limits  URINALYSIS, COMPLETE (UACMP) WITH MICROSCOPIC - Abnormal; Notable for the following components:   Color, Urine YELLOW (*)    APPearance CLEAR (*)    Glucose, UA 50 (*)    Protein, ur >=300 (*)    All other components within normal limits  TROPONIN I - Abnormal; Notable for the following components:   Troponin I 0.04 (*)    All other components within normal limits  TROPONIN I - Abnormal; Notable for the following components:   Troponin I 0.04 (*)    All other components within normal limits  GLUCOSE, CAPILLARY - Abnormal; Notable for the following components:   Glucose-Capillary 177 (*)    All other components within normal limits  MRSA PCR SCREENING  LIPASE, BLOOD  LACTIC ACID, PLASMA   ____________________________________________  EKG  ED ECG REPORT I, Loney Hering, the attending physician, personally viewed and interpreted this ECG.   Date: 03/01/2018  EKG Time: 2334  Rate: 62  Rhythm: normal sinus rhythm  Axis: normal  Intervals:none  ST&T Change:  none  ____________________________________________  RADIOLOGY  ED MD interpretation:  CT angio abdomen and pelvis: Borderline fluid-filled and large loops distal small bowel primarily within the lower abdomen and pelvis without well-defined transition point.  Findings could be secondary to ileus or low-grade obstruction, no free air, gallstones, small amount of free fluid in the pelvis, cardiomegaly.  Ultrasound  right upper quadrant abdomen: Small stones and sludge in the gallbladder, no evidence for acute cholecystitis or biliary dilatation, trace amount of fluid adjacent to the liver and right kidney, small cyst within the liver.  Official radiology report(s): Ct Angio Abd/pel W And/or Wo Contrast  Result Date: 03/02/2018 CLINICAL DATA:  Generalized abdominal pain EXAM: CTA ABDOMEN AND PELVIS wITHOUT AND WITH CONTRAST TECHNIQUE: Multidetector CT imaging of the abdomen and pelvis was performed using the standard protocol during bolus administration of intravenous contrast. Multiplanar reconstructed images and MIPs were obtained and reviewed to evaluate the vascular anatomy. CONTRAST:  72mL ISOVUE-370 IOPAMIDOL (ISOVUE-370) INJECTION 76% COMPARISON:  CT 06/26/2017 FINDINGS: VASCULAR Aorta: Moderate severe aortic atherosclerosis. No aneurysm or dissection. Celiac: Heavily calcified at the origin suspected moderate stenosis at the origin. SMA: Moderate calcification at the origin. Suspected moderate stenosis at the origin of the SMA. Distal vascular patency. Renals: Single right and single left renal arteries with dense calcification at the origins. Mild to moderate stenosis suspected at the origin of right renal artery. Suspected moderate stenosis at the origin of the left renal artery. IMA: Calcified at the origin.  Distal patency. Inflow: Moderate severe aortic and common iliac disease without occlusion or high-grade focal stenosis. Moderate disease of the internal iliac arteries. No significant  stenosis of the external iliac vessels. Proximal Outflow: No significant stenosis at the common femoral or imaged portions of the superficial femoral and profunda arteries. Moderate aortic atherosclerosis. Review of the MIP images confirms the above findings. NON-VASCULAR Lower chest: Lung bases demonstrate no acute consolidation or effusion. There is cardiomegaly. Hepatobiliary: Subcentimeter hypodense liver lesions too small to characterize. Stones in the gallbladder. No biliary enlargement Pancreas: Unremarkable. No pancreatic ductal dilatation or surrounding inflammatory changes. Spleen: Normal in size without focal abnormality. Adrenals/Urinary Tract: Adrenal glands are within normal limits. No hydronephrosis. Subcentimeter cortical hypodense renal lesions too small to further characterize. Bladder unremarkable Stomach/Bowel: Stomach is nonenlarged. Fluid-filled borderline enlarged  loops of small bowel, mostly within the pelvis. No significant bowel wall thickening. Poorly identified appendix. Possible fluid-filled bowel in the right groin, no evidence for incarceration. Lymphatic: No significantly enlarged lymph nodes. Reproductive: No adnexal mass. Other: No free air.  Small amount of free fluid in the pelvis. Musculoskeletal: Scoliosis and degenerative changes of the spine. No acute or suspicious abnormality. Diffuse anasarca. IMPRESSION: VASCULAR Negative for aortic dissection or acute aortic occlusive disease. Moderate severe aortic atherosclerosis with stenosis of branch vessels in the abdomen and pelvis as detailed above. NON-VASCULAR 1. Borderline fluid-filled enlarged loops distal small bowel primarily within the lower abdomen and pelvis without well-defined transition point. Findings could be secondary to ileus or low-grade obstruction. No free air. 2. Gallstones 3. Small amount of free fluid in the pelvis 4. Cardiomegaly Electronically Signed   By: Donavan Foil M.D.   On: 03/02/2018 00:59   US  Abdomen Limited Ruq  Result Date: 03/02/2018 CLINICAL DATA:  Generalized abdominal pain with nausea EXAM: ULTRASOUND ABDOMEN LIMITED RIGHT UPPER QUADRANT COMPARISON:  CT 03/01/2018 FINDINGS: Gallbladder: Small amount of echogenic sludge. Negative sonographic Murphy. Small stones in the gallbladder. Normal wall thickness. Common bile duct: Diameter: 2.9 mm Liver: Hepatic echogenicity within normal limits. Small cysts within the liver measuring up to 1.1 cm. Portal vein is patent on color Doppler imaging with normal direction of blood flow towards the liver. Tiny amount of fluid adjacent to the liver. Tiny amount of right perinephric fluid. IMPRESSION: 1. Small stones and sludge in the gallbladder. No evidence for acute cholecystitis or biliary dilatation. 2. Trace amount of fluid adjacent to the liver and right kidney 3. Small cysts within the liver Electronically Signed   By: Donavan Foil M.D.   On: 03/02/2018 03:32    ____________________________________________   PROCEDURES  Procedure(s) performed: None  Procedures  Critical Care performed: No  ____________________________________________   INITIAL IMPRESSION / ASSESSMENT AND PLAN / ED COURSE  As part of my medical decision making, I reviewed the following data within the electronic MEDICAL RECORD NUMBER Notes from prior ED visits and [place] Controlled Substance Database   This is a 84 year old female who comes into the hospital today with some abdominal pain.  The patient is having some diffuse pain with nausea no vomiting.    Given the patient's age my differential diagnosis includes diverticulitis, colitis, cholecystitis.  I am also concerned about mesenteric ischemia as well as abdominal aortic aneurysm.  The patient did have some blood work to include a CBC, CMP, lipase and a urinalysis which were all unremarkable.  I did order a lactic acid on the patient and she will be sent for a CT angios of her abdomen and pelvis.  I will give the  patient a dose of morphine 2 mg IV and some Zofran.  She will be reassessed.  I did reassess the patient and she was still having pain so I did give her a dose of morphine again.  The patient blood pressure also was very elevated so I gave her a dose of hydralazine.  Her CT scan showed a possible ileus with no transition signs for obstruction.  The patient had some blood work and her troponin was 0.04.  Although the patient does not have a AAA or mesenteric ischemia I am concerned due to her elevated blood pressure and her abdominal pain.  She received a third dose of morphine 4 mg as well as some more hydralazine.  I decided to admit the patient to  the hospitalist service.  In conjunction with the hospitalist we decided to place the patient on a nicardipine drip.  She will be admitted to the stepdown unit.      ____________________________________________   FINAL CLINICAL IMPRESSION(S) / ED DIAGNOSES  Final diagnoses:  Abdominal pain  Ileus ([place])  Hypertension, unspecified type     ED Discharge Orders    None       Note:  This document was prepared using Dragon voice recognition software and may include unintentional dictation errors.    Loney Hering, MD 03/02/18 [phone]

## 2018-03-01 NOTE — Telephone Encounter (Signed)
Anderson Malta with [facility] returning a call to [place]. Please advise. CB#: [phone]

## 2018-03-02 ENCOUNTER — Encounter: Payer: Self-pay | Admitting: Internal Medicine

## 2018-03-02 ENCOUNTER — Emergency Department: Payer: Medicare Other

## 2018-03-02 DIAGNOSIS — K56609 Unspecified intestinal obstruction, unspecified as to partial versus complete obstruction: Secondary | ICD-10-CM | POA: Diagnosis not present

## 2018-03-02 DIAGNOSIS — N179 Acute kidney failure, unspecified: Secondary | ICD-10-CM | POA: Diagnosis not present

## 2018-03-02 DIAGNOSIS — J90 Pleural effusion, not elsewhere classified: Secondary | ICD-10-CM | POA: Diagnosis present

## 2018-03-02 DIAGNOSIS — E43 Unspecified severe protein-calorie malnutrition: Secondary | ICD-10-CM | POA: Diagnosis present

## 2018-03-02 DIAGNOSIS — R109 Unspecified abdominal pain: Secondary | ICD-10-CM | POA: Diagnosis present

## 2018-03-02 DIAGNOSIS — I161 Hypertensive emergency: Secondary | ICD-10-CM | POA: Diagnosis present

## 2018-03-02 DIAGNOSIS — J811 Chronic pulmonary edema: Secondary | ICD-10-CM | POA: Diagnosis present

## 2018-03-02 DIAGNOSIS — E860 Dehydration: Secondary | ICD-10-CM | POA: Diagnosis present

## 2018-03-02 DIAGNOSIS — Z79890 Hormone replacement therapy (postmenopausal): Secondary | ICD-10-CM | POA: Diagnosis not present

## 2018-03-02 DIAGNOSIS — K8020 Calculus of gallbladder without cholecystitis without obstruction: Secondary | ICD-10-CM | POA: Diagnosis present

## 2018-03-02 DIAGNOSIS — D649 Anemia, unspecified: Secondary | ICD-10-CM | POA: Diagnosis present

## 2018-03-02 DIAGNOSIS — K660 Peritoneal adhesions (postprocedural) (postinfection): Secondary | ICD-10-CM | POA: Diagnosis present

## 2018-03-02 DIAGNOSIS — E8770 Fluid overload, unspecified: Secondary | ICD-10-CM | POA: Diagnosis not present

## 2018-03-02 DIAGNOSIS — J9811 Atelectasis: Secondary | ICD-10-CM | POA: Diagnosis present

## 2018-03-02 DIAGNOSIS — J45909 Unspecified asthma, uncomplicated: Secondary | ICD-10-CM | POA: Diagnosis present

## 2018-03-02 DIAGNOSIS — Z4659 Encounter for fitting and adjustment of other gastrointestinal appliance and device: Secondary | ICD-10-CM | POA: Diagnosis not present

## 2018-03-02 DIAGNOSIS — Z681 Body mass index (BMI) 19 or less, adult: Secondary | ICD-10-CM | POA: Diagnosis not present

## 2018-03-02 DIAGNOSIS — I517 Cardiomegaly: Secondary | ICD-10-CM | POA: Diagnosis present

## 2018-03-02 DIAGNOSIS — J9601 Acute respiratory failure with hypoxia: Secondary | ICD-10-CM | POA: Diagnosis not present

## 2018-03-02 DIAGNOSIS — I160 Hypertensive urgency: Secondary | ICD-10-CM | POA: Diagnosis present

## 2018-03-02 DIAGNOSIS — I700 Atherosclerosis of aorta: Secondary | ICD-10-CM | POA: Diagnosis present

## 2018-03-02 DIAGNOSIS — K4130 Unilateral femoral hernia, with obstruction, without gangrene, not specified as recurrent: Secondary | ICD-10-CM | POA: Diagnosis present

## 2018-03-02 DIAGNOSIS — Z7189 Other specified counseling: Secondary | ICD-10-CM | POA: Diagnosis not present

## 2018-03-02 DIAGNOSIS — E1165 Type 2 diabetes mellitus with hyperglycemia: Secondary | ICD-10-CM

## 2018-03-02 DIAGNOSIS — K567 Ileus, unspecified: Secondary | ICD-10-CM | POA: Diagnosis present

## 2018-03-02 DIAGNOSIS — Z515 Encounter for palliative care: Secondary | ICD-10-CM | POA: Diagnosis present

## 2018-03-02 DIAGNOSIS — E039 Hypothyroidism, unspecified: Secondary | ICD-10-CM | POA: Diagnosis present

## 2018-03-02 DIAGNOSIS — D72829 Elevated white blood cell count, unspecified: Secondary | ICD-10-CM | POA: Diagnosis present

## 2018-03-02 DIAGNOSIS — I10 Essential (primary) hypertension: Secondary | ICD-10-CM | POA: Diagnosis not present

## 2018-03-02 DIAGNOSIS — E876 Hypokalemia: Secondary | ICD-10-CM | POA: Diagnosis not present

## 2018-03-02 LAB — GLUCOSE, CAPILLARY
GLUCOSE-CAPILLARY: 168 mg/dL — AB (ref 70–99)
Glucose-Capillary: 177 mg/dL — ABNORMAL HIGH (ref 70–99)
Glucose-Capillary: 117 mg/dL — ABNORMAL HIGH (ref 70–99)
Glucose-Capillary: 150 mg/dL — ABNORMAL HIGH (ref 70–99)

## 2018-03-02 LAB — TROPONIN I: Troponin I: 0.04 ng/mL (ref <0.03)

## 2018-03-02 LAB — MRSA PCR SCREENING: MRSA by PCR: NEGATIVE

## 2018-03-02 LAB — LACTIC ACID, PLASMA: Lactic Acid, Venous: 1.1 mmol/L (ref 0.5–1.9)

## 2018-03-02 MED ORDER — METOPROLOL SUCCINATE ER 50 MG PO TB24: 25.0000 mg | ORAL_TABLET | Freq: Two times a day (BID) | ORAL | Status: DC

## 2018-03-02 MED ORDER — METOPROLOL TARTRATE 5 MG/5ML IV SOLN
2.5000 mg | INTRAVENOUS | Status: DC | PRN
  Filled 2018-03-02: qty 5

## 2018-03-02 MED ORDER — MORPHINE SULFATE (PF) 4 MG/ML IV SOLN: 4.0000 mg | INTRAVENOUS | Status: DC | PRN

## 2018-03-02 MED ORDER — NICARDIPINE HCL IN NACL 20-0.86 MG/200ML-% IV SOLN
0.0000 mg/h | INTRAVENOUS | Status: DC
  Administered 2018-03-02: 5 mg/h via INTRAVENOUS
  Administered 2018-03-02: 7.5 mg/h via INTRAVENOUS
  Administered 2018-03-02: 4 mg/h via INTRAVENOUS
  Administered 2018-03-02: 6 mg/h via INTRAVENOUS
  Administered 2018-03-03: 5 mg/h via INTRAVENOUS
  Administered 2018-03-03: 6.5 mg/h via INTRAVENOUS
  Administered 2018-03-03: 4 mg/h via INTRAVENOUS
  Administered 2018-03-03: 7.5 mg/h via INTRAVENOUS
  Administered 2018-03-03: 4 mg/h via INTRAVENOUS
  Administered 2018-03-03: 7.5 mg/h via INTRAVENOUS
  Administered 2018-03-04: 4 mg/h via INTRAVENOUS
  Administered 2018-03-04: 7.5 mg/h via INTRAVENOUS
  Administered 2018-03-04: 7 mg/h via INTRAVENOUS
  Administered 2018-03-04: 7.5 mg/h via INTRAVENOUS
  Filled 2018-03-02 (×14): qty 200

## 2018-03-02 MED ORDER — ACETAMINOPHEN 325 MG PO TABS
650.0000 mg | ORAL_TABLET | Freq: Four times a day (QID) | ORAL | Status: DC | PRN
  Administered 2018-03-05 – 2018-03-15 (×5): 650 mg via ORAL
  Filled 2018-03-02 (×7): qty 2

## 2018-03-02 MED ORDER — INSULIN ASPART 100 UNIT/ML ~~LOC~~ SOLN
0.0000 [IU] | Freq: Three times a day (TID) | SUBCUTANEOUS | Status: DC
  Administered 2018-03-02: 2 [IU] via SUBCUTANEOUS
  Filled 2018-03-02: qty 1

## 2018-03-02 MED ORDER — ONDANSETRON HCL 4 MG/2ML IJ SOLN
4.0000 mg | Freq: Once | INTRAMUSCULAR | Status: DC
  Filled 2018-03-02: qty 2

## 2018-03-02 MED ORDER — ASPIRIN EC 81 MG PO TBEC
81.0000 mg | DELAYED_RELEASE_TABLET | Freq: Every day | ORAL | Status: DC
  Administered 2018-03-03: 81 mg via ORAL
  Filled 2018-03-02: qty 1

## 2018-03-02 MED ORDER — HYDRALAZINE HCL 20 MG/ML IJ SOLN: 5.0000 mg | INTRAMUSCULAR | Status: DC | PRN

## 2018-03-02 MED ORDER — MORPHINE SULFATE (PF) 4 MG/ML IV SOLN
4.0000 mg | Freq: Once | INTRAVENOUS | Status: DC
  Filled 2018-03-02: qty 1

## 2018-03-02 MED ORDER — MORPHINE SULFATE (PF) 2 MG/ML IV SOLN: 2.0000 mg | INTRAVENOUS | Status: DC | PRN

## 2018-03-02 MED ORDER — SODIUM CHLORIDE 0.9 % IV BOLUS
500.0000 mL | Freq: Once | INTRAVENOUS | Status: AC
  Administered 2018-03-02: 500 mL via INTRAVENOUS

## 2018-03-02 MED ORDER — NICARDIPINE HCL IN NACL 20-0.86 MG/200ML-% IV SOLN
3.0000 mg/h | Freq: Once | INTRAVENOUS | Status: AC
  Administered 2018-03-02: 5 mg/h via INTRAVENOUS
  Filled 2018-03-02: qty 200

## 2018-03-02 MED ORDER — MORPHINE SULFATE (PF) 4 MG/ML IV SOLN
4.0000 mg | Freq: Once | INTRAVENOUS | Status: AC
  Administered 2018-03-02: 4 mg via INTRAVENOUS
  Filled 2018-03-02: qty 1

## 2018-03-02 MED ORDER — NIFEDIPINE ER 60 MG PO TB24
60.0000 mg | ORAL_TABLET | Freq: Every day | ORAL | Status: DC
  Filled 2018-03-02: qty 1

## 2018-03-02 MED ORDER — INSULIN ASPART 100 UNIT/ML ~~LOC~~ SOLN: 0.0000 [IU] | Freq: Four times a day (QID) | SUBCUTANEOUS | Status: DC

## 2018-03-02 MED ORDER — ONDANSETRON HCL 4 MG/2ML IJ SOLN
4.0000 mg | Freq: Four times a day (QID) | INTRAMUSCULAR | Status: DC | PRN
  Administered 2018-03-02 – 2018-03-09 (×4): 4 mg via INTRAVENOUS
  Filled 2018-03-02 (×4): qty 2

## 2018-03-02 MED ORDER — LEVOTHYROXINE SODIUM 50 MCG PO TABS
50.0000 ug | ORAL_TABLET | Freq: Every day | ORAL | Status: DC
  Administered 2018-03-02: 50 ug via ORAL
  Filled 2018-03-02: qty 1

## 2018-03-02 MED ORDER — ENOXAPARIN SODIUM 40 MG/0.4ML ~~LOC~~ SOLN: 40.0000 mg | SUBCUTANEOUS | Status: DC

## 2018-03-02 MED ORDER — INSULIN ASPART 100 UNIT/ML ~~LOC~~ SOLN
0.0000 [IU] | Freq: Four times a day (QID) | SUBCUTANEOUS | Status: DC
  Administered 2018-03-02 – 2018-03-03 (×2): 1 [IU] via SUBCUTANEOUS
  Filled 2018-03-02 (×2): qty 1

## 2018-03-02 MED ORDER — SENNOSIDES-DOCUSATE SODIUM 8.6-50 MG PO TABS: 1.0000 | ORAL_TABLET | Freq: Every evening | ORAL | Status: DC | PRN

## 2018-03-02 MED ORDER — HYDRALAZINE HCL 20 MG/ML IJ SOLN
10.0000 mg | Freq: Once | INTRAMUSCULAR | Status: AC
  Administered 2018-03-02: 10 mg via INTRAVENOUS
  Filled 2018-03-02: qty 1

## 2018-03-02 MED ORDER — ONDANSETRON HCL 4 MG PO TABS: 4.0000 mg | ORAL_TABLET | Freq: Four times a day (QID) | ORAL | Status: DC | PRN

## 2018-03-02 MED ORDER — ENOXAPARIN SODIUM 30 MG/0.3ML ~~LOC~~ SOLN
30.0000 mg | SUBCUTANEOUS | Status: DC
  Administered 2018-03-02 – 2018-03-07 (×6): 30 mg via SUBCUTANEOUS
  Filled 2018-03-02 (×6): qty 0.3

## 2018-03-02 MED ORDER — BISACODYL 5 MG PO TBEC: 5.0000 mg | DELAYED_RELEASE_TABLET | Freq: Every day | ORAL | Status: DC | PRN

## 2018-03-02 MED ORDER — LEVOTHYROXINE SODIUM 100 MCG IV SOLR
25.0000 ug | Freq: Every day | INTRAVENOUS | Status: DC
  Administered 2018-03-03 – 2018-03-13 (×11): 25 ug via INTRAVENOUS
  Filled 2018-03-02 (×12): qty 5

## 2018-03-02 MED ORDER — ACETAMINOPHEN 650 MG RE SUPP
650.0000 mg | Freq: Four times a day (QID) | RECTAL | Status: DC | PRN
  Administered 2018-03-05: 650 mg via RECTAL
  Filled 2018-03-02 (×2): qty 1

## 2018-03-02 MED ORDER — LACTATED RINGERS IV SOLN
INTRAVENOUS | Status: DC
  Administered 2018-03-02: 07:00:00 via INTRAVENOUS

## 2018-03-02 MED ORDER — MORPHINE SULFATE (PF) 2 MG/ML IV SOLN
2.0000 mg | Freq: Once | INTRAVENOUS | Status: AC
  Administered 2018-03-02: 2 mg via INTRAVENOUS
  Filled 2018-03-02: qty 1

## 2018-03-02 MED ORDER — SALINE SPRAY 0.65 % NA SOLN
1.0000 | NASAL | Status: DC | PRN
  Administered 2018-03-02 – 2018-03-14 (×7): 1 via NASAL
  Filled 2018-03-02: qty 44

## 2018-03-02 NOTE — Progress Notes (Signed)
Patient admitted with elevated blood pressure and SBO.  Continue current management.

## 2018-03-02 NOTE — Progress Notes (Signed)
Chaplain received OR for prayer request. Chaplain enter room and introduced herself as the on-call Chaplain. [name] daughter(Tara [name]) was visiting with patient. Tara [name] was trying to find the bag for the patient to throw up in, she was sick on her stomach. The Chaplain assists her in finding bag. Once patient was relaxedChaplain explain she was there to offer prayer. Patient and daughter where excited and very open to prayer. Nurse came in to give patient medicine for nausea, Chaplain was going to wait but nurse joined in with prayer as well. Chaplain prayed for comfort and peace. Chaplain ended prayer and told patient and daughter she will be here for 24 hrs. And she can call if needed. Patient and daughter thanked [name]

## 2018-03-02 NOTE — Progress Notes (Signed)
Chaplain went to check on patient to see if she was feeling better from nausea. Chaplain found patient daughter in waiting area and stops to visit. Chaplain ask is patient feeling better daughter Prentice Docker) said she was feeling much better and the nurse was working on her now. Tara [name] said the patient had been sleeping off and on. Tara [name] thanked [place] for checking back on her mother. Chaplain said no problem and ask the daughter how long would she be here, she stated until 1 pm and then back at [place] said ok, and she will check on patient later.

## 2018-03-02 NOTE — ED Notes (Signed)
Pt to u/s via stretcher accomp by u/s tech 

## 2018-03-02 NOTE — ED Notes (Signed)
Pt returns to room from CT; resting quietly uprite on stretcher with no distress; reports feeling much better now after meds received with no nausea and decreased pain

## 2018-03-02 NOTE — Consult Note (Signed)
PULMONARY / CRITICAL CARE MEDICINE   Name: Tara [name] MRN: [id number] DOB: 12-Apr-1918    ADMISSION DATE:  03/01/2018  PT PROFILE: Highly independent 89 F adm via ED with high grade ileus vs SBO and severe hypertension requiring nicardipine infusion  HISTORY OF PRESENT ILLNESS:   As above. She developed N/V and abd pain after dinner 07/22.  Denies fever and diarrhea.  In the ED, she had very severe hypertension and nicardipine infusion was initiated.  Therefore, admitted to stepdown unit.  PAST MEDICAL HISTORY :  She  has a past medical history of Asthma, Diabetes mellitus ([place]), Environmental allergies, and Hypertension.  PAST SURGICAL HISTORY: She  has a past surgical history that includes Appendectomy and Tubal ligation.  Allergies  Allergen Reactions  . Ace Inhibitors Other (See Comments)    Angioedema  . Hydrochlorothiazide W-Triamterene Other (See Comments)    Gout flare  . Lisinopril Swelling and Other (See Comments)  . Penicillins Swelling  . Trichlormethiazide Other (See Comments)    Gout flare    No current facility-administered medications on file prior to encounter.    Current Outpatient Medications on File Prior to Encounter  Medication Sig  . acetaminophen (TYLENOL) 325 MG tablet Take 650 mg by mouth every 6 (six) hours as needed for mild pain.   Marland Kitchen amLODipine (NORVASC) 5 MG tablet Take 5 mg by mouth daily.  Marland Kitchen aspirin (ASPIRIN 81) 81 MG EC tablet Take 81 mg by mouth daily. Swallow whole.  . Calcium Carbonate-Vitamin D (CALTRATE 600+D PO) Take 1 tablet by mouth daily.   . diclofenac sodium (VOLTAREN) 1 % GEL Apply 4 g topically 3 (three) times daily as needed. Apply to the Right Knee for effusion  . fexofenadine (ALLEGRA) 180 MG tablet Take 1 tablet (180 mg total) by mouth daily.  . fluticasone (FLONASE) 50 MCG/ACT nasal spray Place into both nostrils daily.  . hydrALAZINE (APRESOLINE) 50 MG tablet TAKE 1 TABLET BY MOUTH 3 TIMES A DAY  . metoprolol succinate  (TOPROL-XL) 25 MG 24 hr tablet Take 1 tablet (25 mg total) by mouth 2 (two) times daily.  Marland Kitchen SYNTHROID 50 MCG tablet TAKE ONE (1) TABLET EACH DAY  . Amino Acids-Protein Hydrolys (FEEDING SUPPLEMENT, PRO-STAT SUGAR FREE 64,) LIQD Take 30 mLs by mouth 2 (two) times daily between meals.  Marland Kitchen glucose blood (ACCU-CHEK AVIVA PLUS) test strip CHECK SUGAR ONCE DAILY  . metoprolol tartrate (LOPRESSOR) 25 MG tablet Take 25 mg by mouth 2 (two) times daily.    FAMILY HISTORY:  Her family history includes Cancer in her brother and sister; Hypertension in her mother.  SOCIAL HISTORY: She  reports that she has never smoked. She has never used smokeless tobacco. She reports that she does not drink alcohol or use drugs.  REVIEW OF SYSTEMS:   As per HPI  SUBJECTIVE:    VITAL SIGNS: BP (!) 171/70   Pulse 71   Temp 97.6 F (36.4 C) (Oral)   Resp (!) 25   Ht 4\' 11"  (1.499 m)   Wt 102 lb 1.2 oz (46.3 kg)   SpO2 95%   BMI 20.62 kg/m   HEMODYNAMICS:    VENTILATOR SETTINGS:    INTAKE / OUTPUT: I/O last 3 completed shifts: In: 630.1 [I.V.:72.5; IV Piggyback:557.6] Out: -   PHYSICAL EXAMINATION: General: NAD Neuro: No focal deficits HEENT: NCAT, sclerae white, oropharynx normal Cardiovascular: Regular, no M Lungs: Clear to auscultation and percussion Abdomen: Moderately distended, moderately firm, mildly tender, + BS in all  quadrants Extremities: Warm, no edema  LABS:  BMET Recent Labs  Lab 03/01/18 2250  NA 139  K 4.4  CL 106  CO2 26  BUN 31*  CREATININE 0.96  GLUCOSE 234*    Electrolytes Recent Labs  Lab 03/01/18 2250  CALCIUM 8.9    CBC Recent Labs  Lab 03/01/18 2250  WBC 7.0  HGB 12.1  HCT 36.2  PLT 257    Coag's No results for input(s): APTT, INR in the last 168 hours.  Sepsis Markers Recent Labs  Lab 03/01/18 2321  LATICACIDVEN 1.1    ABG No results for input(s): PHART, PCO2ART, PO2ART in the last 168 hours.  Liver Enzymes Recent Labs  Lab  03/01/18 2250  AST 34  ALT 15  ALKPHOS 45  BILITOT 0.5  ALBUMIN 3.4*    Cardiac Enzymes Recent Labs  Lab 03/01/18 2250 03/02/18 0402  TROPONINI 0.04* 0.04*    Glucose Recent Labs  Lab 03/02/18 0621 03/02/18 0750  GLUCAP 177* 168*    Imaging Ct Angio Abd/pel W And/or Wo Contrast  Result Date: 03/02/2018 CLINICAL DATA:  Generalized abdominal pain EXAM: CTA ABDOMEN AND PELVIS wITHOUT AND WITH CONTRAST TECHNIQUE: Multidetector CT imaging of the abdomen and pelvis was performed using the standard protocol during bolus administration of intravenous contrast. Multiplanar reconstructed images and MIPs were obtained and reviewed to evaluate the vascular anatomy. CONTRAST:  47mL ISOVUE-370 IOPAMIDOL (ISOVUE-370) INJECTION 76% COMPARISON:  CT 06/26/2017 FINDINGS: VASCULAR Aorta: Moderate severe aortic atherosclerosis. No aneurysm or dissection. Celiac: Heavily calcified at the origin suspected moderate stenosis at the origin. SMA: Moderate calcification at the origin. Suspected moderate stenosis at the origin of the SMA. Distal vascular patency. Renals: Single right and single left renal arteries with dense calcification at the origins. Mild to moderate stenosis suspected at the origin of right renal artery. Suspected moderate stenosis at the origin of the left renal artery. IMA: Calcified at the origin.  Distal patency. Inflow: Moderate severe aortic and common iliac disease without occlusion or high-grade focal stenosis. Moderate disease of the internal iliac arteries. No significant stenosis of the external iliac vessels. Proximal Outflow: No significant stenosis at the common femoral or imaged portions of the superficial femoral and profunda arteries. Moderate aortic atherosclerosis. Review of the MIP images confirms the above findings. NON-VASCULAR Lower chest: Lung bases demonstrate no acute consolidation or effusion. There is cardiomegaly. Hepatobiliary: Subcentimeter hypodense liver lesions  too small to characterize. Stones in the gallbladder. No biliary enlargement Pancreas: Unremarkable. No pancreatic ductal dilatation or surrounding inflammatory changes. Spleen: Normal in size without focal abnormality. Adrenals/Urinary Tract: Adrenal glands are within normal limits. No hydronephrosis. Subcentimeter cortical hypodense renal lesions too small to further characterize. Bladder unremarkable Stomach/Bowel: Stomach is nonenlarged. Fluid-filled borderline enlarged loops of small bowel, mostly within the pelvis. No significant bowel wall thickening. Poorly identified appendix. Possible fluid-filled bowel in the right groin, no evidence for incarceration. Lymphatic: No significantly enlarged lymph nodes. Reproductive: No adnexal mass. Other: No free air.  Small amount of free fluid in the pelvis. Musculoskeletal: Scoliosis and degenerative changes of the spine. No acute or suspicious abnormality. Diffuse anasarca. IMPRESSION: VASCULAR Negative for aortic dissection or acute aortic occlusive disease. Moderate severe aortic atherosclerosis with stenosis of branch vessels in the abdomen and pelvis as detailed above. NON-VASCULAR 1. Borderline fluid-filled enlarged loops distal small bowel primarily within the lower abdomen and pelvis without well-defined transition point. Findings could be secondary to ileus or low-grade obstruction. No free air. 2. Gallstones 3.  Small amount of free fluid in the pelvis 4. Cardiomegaly Electronically Signed   By: Donavan Foil M.D.   On: 03/02/2018 00:59   US Abdomen Limited Ruq  Result Date: 03/02/2018 CLINICAL DATA:  Generalized abdominal pain with nausea EXAM: ULTRASOUND ABDOMEN LIMITED RIGHT UPPER QUADRANT COMPARISON:  CT 03/01/2018 FINDINGS: Gallbladder: Small amount of echogenic sludge. Negative sonographic Murphy. Small stones in the gallbladder. Normal wall thickness. Common bile duct: Diameter: 2.9 mm Liver: Hepatic echogenicity within normal limits. Small cysts  within the liver measuring up to 1.1 cm. Portal vein is patent on color Doppler imaging with normal direction of blood flow towards the liver. Tiny amount of fluid adjacent to the liver. Tiny amount of right perinephric fluid. IMPRESSION: 1. Small stones and sludge in the gallbladder. No evidence for acute cholecystitis or biliary dilatation. 2. Trace amount of fluid adjacent to the liver and right kidney 3. Small cysts within the liver Electronically Signed   By: Donavan Foil M.D.   On: 03/02/2018 03:32     ASSESSMENT / PLAN: N/V/abd pain due to SBO Severe hypertension DM2  Bowel rest General surgery to see Continue nicardipine Continue SSI.  Changed from 3 times daily to every 6 hours   Merton Border, MD PCCM service Mobile [phone] Pager [phone] 03/02/2018 11:41 AM

## 2018-03-02 NOTE — ED Notes (Signed)
Pt uprite on stretcher in exam room with no distress noted; family at bedside; pt assisted into hosp gown & on card monitor; reports generalized abd pain accomp by nausea this evening; st hx of same with hernia; resp even/unlab, lungs clear, +bs, abs soft/nondist with generalized discomfort on palpation; pt reports good BM today and denies urinary c/o

## 2018-03-02 NOTE — H&P (Addendum)
[facility] [place] NAME: Sarin Comunale    MR#:  [id number]  DATE OF BIRTH:  12-Sep-1917  DATE OF ADMISSION:  03/01/2018  PRIMARY CARE PHYSICIAN: Einar Pheasant, MD   REQUESTING/REFERRING PHYSICIAN: Loney Hering, MD  CHIEF COMPLAINT:   Chief Complaint  Patient presents with  . Abdominal Pain    HISTORY OF PRESENT ILLNESS:  Jancy Sprankle  is a 84 y.o. female with a known history of T2NIDDM, HTN p/w AP/nausea. Pt AAOx3, and is able to provide Hx w/o difficulty. Hard of hearing, wears R hearing aid at baseline, does not have it with her. Vision good, wears glasses. Functional, lives alone, walks w/ walker, two daughters call/visit regularly. Independent of ADLs, assisted for IADLs. Full code, family has not discussed living will/advance directives in the past.  Pt states she developed AP and nausea shortly after eating dinner, @~1800PM on Monday 03/01/2018. She states she ate chicken, tomatoes and lima beans for dinner, nothing out of the ordinary. She states a very short time after she finished eating, she developed AP, which she states was initially in the lower abdomen (RLQ) and then in the epigastric area. She denies vomiting. She states pain got progressively worse, prompting hospitalization. Had a normal BM during the day. (-) blood in stool. Family states she was in her usual state of health prior to developing symptoms, and has been doing well otherwise. Pt c/o AP at the time of my exam, but is not in acute distress. CTA A/P (+) "Borderline fluid-filled enlarged loops distal small bowel primarily within the lower abdomen and pelvis without well-defined transition point. Findings could be secondary to ileus or low-grade obstruction. No free air."  BP as high as 243/91 in ED, refractory to multiple antihypertensive IV boluses. SBP still 210 at the time of my assessment, pt not in distress as noted above. Denies CP/SOB, HA/blurred vision,  vertigo, LH/LOC, neurological symptoms.  PAST MEDICAL HISTORY:   Past Medical History:  Diagnosis Date  . Asthma   . Diabetes mellitus ([place])   . Environmental allergies   . Hypertension     PAST SURGICAL HISTORY:   Past Surgical History:  Procedure Laterality Date  . APPENDECTOMY    . TUBAL LIGATION      SOCIAL HISTORY:   Social History   Tobacco Use  . Smoking status: Never Smoker  . Smokeless tobacco: Never Used  Substance Use Topics  . Alcohol use: No    Alcohol/week: 0.0 oz    FAMILY HISTORY:   Family History  Problem Relation Age of Onset  . Hypertension Mother   . Cancer Brother        lung (question)  . Cancer Sister        questionable type    DRUG ALLERGIES:   Allergies  Allergen Reactions  . Ace Inhibitors Other (See Comments)    Angioedema  . Hydrochlorothiazide W-Triamterene Other (See Comments)    Gout flare  . Lisinopril Swelling and Other (See Comments)  . Penicillins Swelling  . Trichlormethiazide Other (See Comments)    Gout flare    REVIEW OF SYSTEMS:   Review of Systems  Constitutional: Negative for chills, diaphoresis, fever, malaise/fatigue and weight loss.  HENT: Negative for congestion, ear pain, hearing loss, nosebleeds, sinus pain, sore throat and tinnitus.   Eyes: Negative for blurred vision, double vision and photophobia.  Respiratory: Negative for cough, hemoptysis, sputum production, shortness of breath and wheezing.   Cardiovascular:  Negative for chest pain, palpitations, orthopnea, claudication, leg swelling and PND.  Gastrointestinal: Positive for abdominal pain and nausea. Negative for blood in stool, constipation, diarrhea, heartburn, melena and vomiting.  Genitourinary: Negative for dysuria, flank pain, frequency, hematuria and urgency.  Musculoskeletal: Negative for back pain, falls, joint pain, myalgias and neck pain.  Skin: Negative for itching and rash.  Neurological: Negative for dizziness, tingling, tremors,  sensory change, speech change, focal weakness, seizures, loss of consciousness, weakness and headaches.  Psychiatric/Behavioral: Negative for memory loss. The patient does not have insomnia.    MEDICATIONS AT HOME:   Prior to Admission medications   Medication Sig Start Date End Date Taking? Authorizing Provider  acetaminophen (TYLENOL) 325 MG tablet Take 650 mg by mouth every 6 (six) hours as needed for mild pain.    Yes [provider]  amLODipine (NORVASC) 5 MG tablet Take 5 mg by mouth daily.   Yes [provider]  aspirin (ASPIRIN 81) 81 MG EC tablet Take 81 mg by mouth daily. Swallow whole.   Yes [provider]  Calcium Carbonate-Vitamin D (CALTRATE 600+D PO) Take 1 tablet by mouth daily.    Yes [provider]  diclofenac sodium (VOLTAREN) 1 % GEL Apply 4 g topically 3 (three) times daily as needed. Apply to the Right Knee for effusion 12/23/17  Yes Einar Pheasant, MD  fexofenadine (ALLEGRA) 180 MG tablet Take 1 tablet (180 mg total) by mouth daily. 02/10/18  Yes Einar Pheasant, MD  fluticasone (FLONASE) 50 MCG/ACT nasal spray Place into both nostrils daily.   Yes [provider]  hydrALAZINE (APRESOLINE) 50 MG tablet TAKE 1 TABLET BY MOUTH 3 TIMES A DAY 02/10/18  Yes Einar Pheasant, MD  metoprolol succinate (TOPROL-XL) 25 MG 24 hr tablet Take 1 tablet (25 mg total) by mouth 2 (two) times daily. 12/21/17  Yes Einar Pheasant, MD  SYNTHROID 50 MCG tablet TAKE ONE (1) TABLET EACH DAY 09/24/17  Yes Einar Pheasant, MD  Amino Acids-Protein Hydrolys (FEEDING SUPPLEMENT, PRO-STAT SUGAR FREE 64,) LIQD Take 30 mLs by mouth 2 (two) times daily between meals.    [provider]  glucose blood (ACCU-CHEK AVIVA PLUS) test strip CHECK SUGAR ONCE DAILY 09/24/15   Einar Pheasant, MD  metoprolol tartrate (LOPRESSOR) 25 MG tablet Take 25 mg by mouth 2 (two) times daily.    [provider]      VITAL SIGNS:  Blood pressure (!) 213/72, pulse 69,  temperature 98.2 F (36.8 C), temperature source Oral, resp. rate 19, weight 43.5 kg (96 lb), SpO2 93 %.  PHYSICAL EXAMINATION:  Physical Exam  Constitutional: She is oriented to person, place, and time. She appears well-developed. She is active and cooperative.  Non-toxic appearance. No distress. She is not intubated.  HENT:  Head: Normocephalic and atraumatic.  Mouth/Throat: Oropharynx is clear and moist. No oropharyngeal exudate.  Eyes: Conjunctivae and EOM are normal. No scleral icterus.  Neck: Neck supple. No JVD present. No thyromegaly present.  Cardiovascular: Normal rate, regular rhythm, S1 normal, S2 normal and normal heart sounds.  No extrasystoles are present. Exam reveals no gallop, no S3, no S4, no distant heart sounds and no friction rub.  No murmur heard. Pulmonary/Chest: Effort normal and breath sounds normal. No accessory muscle usage or stridor. No apnea, no tachypnea and no bradypnea. She is not intubated. No respiratory distress. She has no decreased breath sounds. She has no wheezes. She has no rhonchi. She has no rales.  Abdominal: Soft. Normal appearance. She  exhibits no distension. Bowel sounds are decreased. There is generalized tenderness and tenderness in the epigastric area. There is no rebound and no guarding.  Musculoskeletal: Normal range of motion. She exhibits no edema or tenderness.  Lymphadenopathy:    She has no cervical adenopathy.  Neurological: She is alert and oriented to person, place, and time. She is not disoriented.  Skin: Skin is warm, dry and intact. No rash noted. She is not diaphoretic. No erythema.  Psychiatric: She has a normal mood and affect. Her speech is normal and behavior is normal. Judgment and thought content normal. Cognition and memory are normal.   LABORATORY PANEL:   CBC Recent Labs  Lab 03/01/18 2250  WBC 7.0  HGB 12.1  HCT 36.2  PLT 257    ------------------------------------------------------------------------------------------------------------------  Chemistries  Recent Labs  Lab 03/01/18 2250  NA 139  K 4.4  CL 106  CO2 26  GLUCOSE 234*  BUN 31*  CREATININE 0.96  CALCIUM 8.9  AST 34  ALT 15  ALKPHOS 45  BILITOT 0.5   ------------------------------------------------------------------------------------------------------------------  Cardiac Enzymes Recent Labs  Lab 03/02/18 0402  TROPONINI 0.04*   ------------------------------------------------------------------------------------------------------------------  RADIOLOGY:  Ct Angio Abd/pel W And/or Wo Contrast  Result Date: 03/02/2018 CLINICAL DATA:  Generalized abdominal pain EXAM: CTA ABDOMEN AND PELVIS wITHOUT AND WITH CONTRAST TECHNIQUE: Multidetector CT imaging of the abdomen and pelvis was performed using the standard protocol during bolus administration of intravenous contrast. Multiplanar reconstructed images and MIPs were obtained and reviewed to evaluate the vascular anatomy. CONTRAST:  54mL ISOVUE-370 IOPAMIDOL (ISOVUE-370) INJECTION 76% COMPARISON:  CT 06/26/2017 FINDINGS: VASCULAR Aorta: Moderate severe aortic atherosclerosis. No aneurysm or dissection. Celiac: Heavily calcified at the origin suspected moderate stenosis at the origin. SMA: Moderate calcification at the origin. Suspected moderate stenosis at the origin of the SMA. Distal vascular patency. Renals: Single right and single left renal arteries with dense calcification at the origins. Mild to moderate stenosis suspected at the origin of right renal artery. Suspected moderate stenosis at the origin of the left renal artery. IMA: Calcified at the origin.  Distal patency. Inflow: Moderate severe aortic and common iliac disease without occlusion or high-grade focal stenosis. Moderate disease of the internal iliac arteries. No significant stenosis of the external iliac vessels. Proximal Outflow:  No significant stenosis at the common femoral or imaged portions of the superficial femoral and profunda arteries. Moderate aortic atherosclerosis. Review of the MIP images confirms the above findings. NON-VASCULAR Lower chest: Lung bases demonstrate no acute consolidation or effusion. There is cardiomegaly. Hepatobiliary: Subcentimeter hypodense liver lesions too small to characterize. Stones in the gallbladder. No biliary enlargement Pancreas: Unremarkable. No pancreatic ductal dilatation or surrounding inflammatory changes. Spleen: Normal in size without focal abnormality. Adrenals/Urinary Tract: Adrenal glands are within normal limits. No hydronephrosis. Subcentimeter cortical hypodense renal lesions too small to further characterize. Bladder unremarkable Stomach/Bowel: Stomach is nonenlarged. Fluid-filled borderline enlarged loops of small bowel, mostly within the pelvis. No significant bowel wall thickening. Poorly identified appendix. Possible fluid-filled bowel in the right groin, no evidence for incarceration. Lymphatic: No significantly enlarged lymph nodes. Reproductive: No adnexal mass. Other: No free air.  Small amount of free fluid in the pelvis. Musculoskeletal: Scoliosis and degenerative changes of the spine. No acute or suspicious abnormality. Diffuse anasarca. IMPRESSION: VASCULAR Negative for aortic dissection or acute aortic occlusive disease. Moderate severe aortic atherosclerosis with stenosis of branch vessels in the abdomen and pelvis as detailed above. NON-VASCULAR 1. Borderline fluid-filled enlarged loops distal small bowel primarily  within the lower abdomen and pelvis without well-defined transition point. Findings could be secondary to ileus or low-grade obstruction. No free air. 2. Gallstones 3. Small amount of free fluid in the pelvis 4. Cardiomegaly Electronically Signed   By: Donavan Foil M.D.   On: 03/02/2018 00:59   US Abdomen Limited Ruq  Result Date: 03/02/2018 CLINICAL DATA:   Generalized abdominal pain with nausea EXAM: ULTRASOUND ABDOMEN LIMITED RIGHT UPPER QUADRANT COMPARISON:  CT 03/01/2018 FINDINGS: Gallbladder: Small amount of echogenic sludge. Negative sonographic Murphy. Small stones in the gallbladder. Normal wall thickness. Common bile duct: Diameter: 2.9 mm Liver: Hepatic echogenicity within normal limits. Small cysts within the liver measuring up to 1.1 cm. Portal vein is patent on color Doppler imaging with normal direction of blood flow towards the liver. Tiny amount of fluid adjacent to the liver. Tiny amount of right perinephric fluid. IMPRESSION: 1. Small stones and sludge in the gallbladder. No evidence for acute cholecystitis or biliary dilatation. 2. Trace amount of fluid adjacent to the liver and right kidney 3. Small cysts within the liver Electronically Signed   By: Donavan Foil M.D.   On: 03/02/2018 03:32   IMPRESSION AND PLAN:   A/P: 12F p/w 1d Hx nausea/AP, CT A/P (+) ileus vs. obstruction. Uncontrolled HTN/accelerated malignant HTN/hypertensive urgency w/o end organ damage. Hyperglycemia (w/ T2NIDDM), dehydration/mild prerenal azotemia, hypoalbuminemia, mild Troponin elevation. -Given concern for diverticulitis/colitis, cholecystitis, pancreatitis, mesenteric ischemia, AAA, etc. CTA A/P performed in ED; study (-) for dissection, occlusion or aneurysm, (+) for ileus vs. low-grade obstruction -Conservative mgmt, NPO, IVF -No active vomiting, NGT not placed -Symptomatic mgmt, pain ctrl, antiemetics -General Surgery consult, expect recommendation for conservative mgmt given advanced age and medical comorbidities -Pt also p/w elevated BP w/ prior Dx HTN; BP in ED as high as 243/91 -Pt's present clinical condition portends inability to properly utilize GI tract (PO medications) for control of hypertension, as I expect ileus/obstruction will impair digestion, absorption and bioavailability -In lieu of creating a blood pressure "roller coaster" with  repeated IV antihypertensive boluses, I have elected to start pt on a titratable antihypertensive drip -Admit to Stepdown -Continuous cardiac monitoring -Pulse ox -HR 60s, Cardene gtt -Transition back to PO antihypertensives as ileus/obstruction improves and resolves -NPO, FSG, SSI -BUN 31, Cr 0.96; intravascular volume depletion, likely 2/2 pressure natriuresis; received IV contrast for CTA; IVF, monitor BMP, avoid nephrotoxic agents -Prealbumin pending -Troponin 0.04, appears to have mild troponin leak based on review of prior labwork; may also be driven by demand, high afterload; as above -c/w ASA81 and other home meds as tolerated -NPO, advance diet as tolerated (as ileus/obstruction improves) -Lovenox -Full code. Pt's family states they have not had any discussion regarding advance directives/living will. I have told them they will need to broach the subject soon -Admission, > 2 midnights   All the records are reviewed and case discussed with ED provider. Management plans discussed with the patient, family and they are in agreement.  CODE STATUS: Full code  TOTAL TIME TAKING CARE OF THIS PATIENT: 75 minutes.    Arta Silence M.D on 03/02/2018 at 5:28 AM  Between 7am to 6pm - Pager - [phone]  After 6pm go to [email or web address] - Proofreader  [facility]  Office  [phone]  CC: Primary care physician; Einar Pheasant, MD   Note: This dictation was prepared with Dragon dictation along with smaller phrase technology. Any transcriptional errors that result from this process are unintentional.

## 2018-03-02 NOTE — Consult Note (Signed)
Subjective:   CC: SBO HPI:  Tara [name] is a 84 y.o. female who was admitted for Symptoms were first noted 1 day ago. Pain is crampy, worsening.  Associated with nausea, but no vomiting.  Non-radiating, normal BM yesterday.   Past Medical History:  has a past medical history of Asthma, Diabetes mellitus ([place]), Environmental allergies, and Hypertension.  Past Surgical History:  Past Surgical History:  Procedure Laterality Date  . APPENDECTOMY    . TUBAL LIGATION      Family History: family history includes Cancer in her brother and sister; Hypertension in her mother.  Social History:  reports that she has never smoked. She has never used smokeless tobacco. She reports that she does not drink alcohol or use drugs.  Current Outpatient Medications:  Medication Sig  . acetaminophen (TYLENOL) 325 MG tablet Take 650 mg by mouth every 6 (six) hours as needed for mild pain.   Marland Kitchen amLODipine (NORVASC) 5 MG tablet Take 5 mg by mouth daily.  Marland Kitchen aspirin (ASPIRIN 81) 81 MG EC tablet Take 81 mg by mouth daily. Swallow whole.  . Calcium Carbonate-Vitamin D (CALTRATE 600+D PO) Take 1 tablet by mouth daily.   . diclofenac sodium (VOLTAREN) 1 % GEL Apply 4 g topically 3 (three) times daily as needed. Apply to the Right Knee for effusion  . fexofenadine (ALLEGRA) 180 MG tablet Take 1 tablet (180 mg total) by mouth daily.  . fluticasone (FLONASE) 50 MCG/ACT nasal spray Place into both nostrils daily.  . hydrALAZINE (APRESOLINE) 50 MG tablet TAKE 1 TABLET BY MOUTH 3 TIMES A DAY  . metoprolol succinate (TOPROL-XL) 25 MG 24 hr tablet Take 1 tablet (25 mg total) by mouth 2 (two) times daily.  Marland Kitchen SYNTHROID 50 MCG tablet TAKE ONE (1) TABLET EACH DAY  . Amino Acids-Protein Hydrolys (FEEDING SUPPLEMENT, PRO-STAT SUGAR FREE 64,) LIQD Take 30 mLs by mouth 2 (two) times daily between meals.  Marland Kitchen glucose blood (ACCU-CHEK AVIVA PLUS) test strip CHECK SUGAR ONCE DAILY  . metoprolol tartrate (LOPRESSOR) 25 MG tablet Take  25 mg by mouth 2 (two) times daily.       Allergies:  Allergies as of 03/01/2018 - Review Complete 03/01/2018  Allergen Reaction Noted  . Ace inhibitors Other (See Comments) 08/01/2014  . Hydrochlorothiazide w-triamterene Other (See Comments) 05/23/2015  . Lisinopril Swelling and Other (See Comments) 06/11/2012  . Penicillins Swelling 06/11/2012  . Trichlormethiazide Other (See Comments) 08/01/2014    ROS:  A 15 point review of systems was performed and pertinent positives and negatives noted in HPI   Objective:     BP (!) 154/59   Pulse 69   Temp 97.6 F (36.4 C) (Oral)   Resp 17   Ht 4\' 11"  (1.499 m)   Wt 46.3 kg (102 lb 1.2 oz)   SpO2 94%   BMI 20.62 kg/m   Constitutional :  alert, cooperative and no distress  Lymphatics/Throat:  no asymmetry, masses, or scars  Respiratory:  clear to auscultation bilaterally  Cardiovascular:  regular rate and rhythm, S1, S2 normal, no murmur, click, rub or gallop  Gastrointestinal: soft, but distended, with some tenderness to palpation along periumbilical region.   Musculoskeletal: Steady movements  Skin: Cool and moist,infraumbilical  surgical scars   Psychiatric: Normal affect, non-agitated, not confused       LABS:  Wbc, BMP-WNL  RADS: CLINICAL DATA:  Generalized abdominal pain  EXAM: CTA ABDOMEN AND PELVIS wITHOUT AND WITH CONTRAST  TECHNIQUE: Multidetector CT imaging of  the abdomen and pelvis was performed using the standard protocol during bolus administration of intravenous contrast. Multiplanar reconstructed images and MIPs were obtained and reviewed to evaluate the vascular anatomy.  CONTRAST:  60mL ISOVUE-370 IOPAMIDOL (ISOVUE-370) INJECTION 76%  COMPARISON:  CT 06/26/2017  FINDINGS: VASCULAR  Aorta: Moderate severe aortic atherosclerosis. No aneurysm or dissection.  Celiac: Heavily calcified at the origin suspected moderate stenosis at the origin.  SMA: Moderate calcification at the origin.  Suspected moderate stenosis at the origin of the SMA. Distal vascular patency.  Renals: Single right and single left renal arteries with dense calcification at the origins. Mild to moderate stenosis suspected at the origin of right renal artery. Suspected moderate stenosis at the origin of the left renal artery.  IMA: Calcified at the origin.  Distal patency.  Inflow: Moderate severe aortic and common iliac disease without occlusion or high-grade focal stenosis. Moderate disease of the internal iliac arteries. No significant stenosis of the external iliac vessels.  Proximal Outflow: No significant stenosis at the common femoral or imaged portions of the superficial femoral and profunda arteries. Moderate aortic atherosclerosis.  Review of the MIP images confirms the above findings.  NON-VASCULAR  Lower chest: Lung bases demonstrate no acute consolidation or effusion. There is cardiomegaly.  Hepatobiliary: Subcentimeter hypodense liver lesions too small to characterize. Stones in the gallbladder. No biliary enlargement  Pancreas: Unremarkable. No pancreatic ductal dilatation or surrounding inflammatory changes.  Spleen: Normal in size without focal abnormality.  Adrenals/Urinary Tract: Adrenal glands are within normal limits. No hydronephrosis. Subcentimeter cortical hypodense renal lesions too small to further characterize. Bladder unremarkable  Stomach/Bowel: Stomach is nonenlarged. Fluid-filled borderline enlarged loops of small bowel, mostly within the pelvis. No significant bowel wall thickening. Poorly identified appendix. Possible fluid-filled bowel in the right groin, no evidence for incarceration.  Lymphatic: No significantly enlarged lymph nodes.  Reproductive: No adnexal mass.  Other: No free air.  Small amount of free fluid in the pelvis.  Musculoskeletal: Scoliosis and degenerative changes of the spine. No acute or suspicious abnormality.  Diffuse anasarca.  IMPRESSION: VASCULAR  Negative for aortic dissection or acute aortic occlusive disease. Moderate severe aortic atherosclerosis with stenosis of branch vessels in the abdomen and pelvis as detailed above.  NON-VASCULAR  1. Borderline fluid-filled enlarged loops distal small bowel primarily within the lower abdomen and pelvis without well-defined transition point. Findings could be secondary to ileus or low-grade obstruction. No free air. 2. Gallstones 3. Small amount of free fluid in the pelvis 4. Cardiomegaly   Electronically Signed   By: Donavan Foil M.D.   On: 03/02/2018 00:59 Assessment:      Abdominal pain SBO vs ileus  HTN urgency Plan:     Discussed pathophisiology and treatment options including NG tube decompression and possible surgery for lysis of adhesions, laparoscopic or open. Alternatives include continued observation and NG decompression.  Benefits include possible symptom relief, preventing further decline in health and possible death.  The patient and daughter verbalized understanding and all questions were answered.  Due to her advanced age and hypertensive urgency currently managed by ICU, I recommended conservative treatement for few day before any surgical intervention.  She is stable and abdominal exam not concerning enough for immediate surgical intervention. I did discuss use of NG tube and she states she rather go ahead and have it inserted now to prevent further discomfort and to gain possible symptom relief.  RN will proceed with placement.  HTN urgency -per medical team.

## 2018-03-02 NOTE — Progress Notes (Signed)
Lovenox changed to 30 mg daily for CrCl <30 and TBW <45 kg.

## 2018-03-03 ENCOUNTER — Inpatient Hospital Stay: Payer: Medicare Other

## 2018-03-03 DIAGNOSIS — K567 Ileus, unspecified: Secondary | ICD-10-CM

## 2018-03-03 DIAGNOSIS — I160 Hypertensive urgency: Secondary | ICD-10-CM

## 2018-03-03 LAB — GLUCOSE, CAPILLARY
GLUCOSE-CAPILLARY: 137 mg/dL — AB (ref 70–99)
GLUCOSE-CAPILLARY: 162 mg/dL — AB (ref 70–99)
GLUCOSE-CAPILLARY: 144 mg/dL — AB (ref 70–99)
Glucose-Capillary: 128 mg/dL — ABNORMAL HIGH (ref 70–99)
Glucose-Capillary: 156 mg/dL — ABNORMAL HIGH (ref 70–99)
Glucose-Capillary: 66 mg/dL — ABNORMAL LOW (ref 70–99)
Glucose-Capillary: 68 mg/dL — ABNORMAL LOW (ref 70–99)

## 2018-03-03 LAB — BASIC METABOLIC PANEL
Anion gap: 7 (ref 5–15)
BUN: 30 mg/dL — AB (ref 8–23)
CALCIUM: 8.5 mg/dL — AB (ref 8.9–10.3)
CO2: 25 mmol/L (ref 22–32)
Chloride: 110 mmol/L (ref 98–111)
Creatinine, Ser: 1.04 mg/dL — ABNORMAL HIGH (ref 0.44–1.00)
GFR calc Af Amer: 50 mL/min — ABNORMAL LOW (ref >60)
GFR calc non Af Amer: 43 mL/min — ABNORMAL LOW (ref >60)
Glucose, Bld: 160 mg/dL — ABNORMAL HIGH (ref 70–99)
Potassium: 4.3 mmol/L (ref 3.5–5.1)
Sodium: 142 mmol/L (ref 135–145)

## 2018-03-03 LAB — PREALBUMIN: Prealbumin: 21.7 mg/dL (ref 18–38)

## 2018-03-03 MED ORDER — DEXTROSE 5 % IV SOLN
INTRAVENOUS | Status: DC
  Administered 2018-03-03 – 2018-03-04 (×2): via INTRAVENOUS

## 2018-03-03 MED ORDER — HYDRALAZINE HCL 20 MG/ML IJ SOLN
5.0000 mg | INTRAMUSCULAR | Status: DC | PRN
  Administered 2018-03-05 – 2018-03-06 (×4): 20 mg via INTRAVENOUS
  Administered 2018-03-07 (×2): 10 mg via INTRAVENOUS
  Administered 2018-03-08 – 2018-03-12 (×3): 20 mg via INTRAVENOUS
  Administered 2018-03-13 (×2): 5 mg via INTRAVENOUS
  Administered 2018-03-15: 20 mg via INTRAVENOUS
  Administered 2018-03-15: 10 mg via INTRAVENOUS
  Administered 2018-03-16: 20 mg via INTRAVENOUS
  Filled 2018-03-03 (×16): qty 1

## 2018-03-03 MED ORDER — METOPROLOL TARTRATE 25 MG PO TABS
12.5000 mg | ORAL_TABLET | Freq: Two times a day (BID) | ORAL | Status: DC
  Administered 2018-03-03: 12.5 mg via ORAL
  Filled 2018-03-03: qty 1

## 2018-03-03 MED ORDER — INSULIN ASPART 100 UNIT/ML ~~LOC~~ SOLN
0.0000 [IU] | Freq: Three times a day (TID) | SUBCUTANEOUS | Status: DC
  Administered 2018-03-03: 1 [IU] via SUBCUTANEOUS
  Administered 2018-03-03 – 2018-03-04 (×2): 2 [IU] via SUBCUTANEOUS
  Filled 2018-03-03 (×2): qty 1

## 2018-03-03 MED ORDER — MORPHINE SULFATE (PF) 2 MG/ML IV SOLN
1.0000 mg | INTRAVENOUS | Status: DC | PRN
  Administered 2018-03-05 – 2018-03-13 (×3): 1 mg via INTRAVENOUS
  Filled 2018-03-03 (×3): qty 1

## 2018-03-03 MED ORDER — AMLODIPINE BESYLATE 5 MG PO TABS
5.0000 mg | ORAL_TABLET | Freq: Every day | ORAL | Status: DC
  Administered 2018-03-03: 5 mg via ORAL
  Filled 2018-03-03: qty 1

## 2018-03-03 MED ORDER — HYDRALAZINE HCL 50 MG PO TABS
50.0000 mg | ORAL_TABLET | Freq: Three times a day (TID) | ORAL | Status: DC
  Administered 2018-03-03: 50 mg via ORAL
  Filled 2018-03-03: qty 1

## 2018-03-03 MED ORDER — DEXTROSE IN LACTATED RINGERS 5 % IV SOLN
INTRAVENOUS | Status: DC
  Administered 2018-03-03: via INTRAVENOUS

## 2018-03-03 MED ORDER — DEXTROSE 50 % IV SOLN
25.0000 mL | Freq: Once | INTRAVENOUS | Status: AC
  Administered 2018-03-03: 25 mL via INTRAVENOUS

## 2018-03-03 MED ORDER — MORPHINE SULFATE (PF) 2 MG/ML IV SOLN
2.0000 mg | INTRAVENOUS | Status: DC | PRN
  Administered 2018-03-06 – 2018-03-12 (×2): 2 mg via INTRAVENOUS
  Filled 2018-03-03 (×3): qty 1

## 2018-03-03 MED ORDER — DEXTROSE 50 % IV SOLN
INTRAVENOUS | Status: AC
  Filled 2018-03-03: qty 50

## 2018-03-03 NOTE — Progress Notes (Signed)
PT Cancellation Note  Patient Details Name: YUDIT MODESITT MRN: [id number] DOB: 10-Aug-1918   Cancelled Treatment:    Reason Eval/Treat Not Completed: Other (comment); Nursing requested PT hold this date secondary to NG tube just inserted prior to this PT arrival.  Will attempt to see pt tomorrow as medically appropriate.     Linus Salmons PT, DPT 03/03/18, 2:10 PM

## 2018-03-03 NOTE — Progress Notes (Signed)
PULMONARY / CRITICAL CARE MEDICINE   Name: Tara [name] MRN: [id number] DOB: 1918/06/04    ADMISSION DATE:  03/01/2018  PT PROFILE: Highly independent 89 F adm via ED with high grade ileus vs SBO and severe hypertension requiring nicardipine infusion   SUBJECTIVE:  Abdominal pain improved.  Denies nausea and vomiting.  Starting to feel a little hungry.  No new complaints.  VITAL SIGNS: BP (!) 160/69   Pulse 85   Temp 99 F (37.2 C) (Oral)   Resp (!) 21   Ht 4\' 11"  (1.499 m)   Wt 102 lb 1.2 oz (46.3 kg)   SpO2 95%   BMI 20.62 kg/m   HEMODYNAMICS:    VENTILATOR SETTINGS:    INTAKE / OUTPUT: I/O last 3 completed shifts: In: 2587.1 [I.V.:2029.5; IV Piggyback:557.6] Out: 370 [Urine:370]  PHYSICAL EXAMINATION: General: NAD Neuro: No focal deficits HEENT: NCAT, sclerae white Cardiovascular: Regular, no M Lungs: Clear to auscultation and percussion Abdomen: Nondistended, minimally tender, + BS in all quadrants Extremities: Warm, no edema  LABS:  BMET Recent Labs  Lab 03/01/18 2250 03/03/18 0619  NA 139 142  K 4.4 4.3  CL 106 110  CO2 26 25  BUN 31* 30*  CREATININE 0.96 1.04*  GLUCOSE 234* 160*    Electrolytes Recent Labs  Lab 03/01/18 2250 03/03/18 0619  CALCIUM 8.9 8.5*    CBC Recent Labs  Lab 03/01/18 2250  WBC 7.0  HGB 12.1  HCT 36.2  PLT 257    Coag's No results for input(s): APTT, INR in the last 168 hours.  Sepsis Markers Recent Labs  Lab 03/01/18 2321  LATICACIDVEN 1.1    ABG No results for input(s): PHART, PCO2ART, PO2ART in the last 168 hours.  Liver Enzymes Recent Labs  Lab 03/01/18 2250  AST 34  ALT 15  ALKPHOS 45  BILITOT 0.5  ALBUMIN 3.4*    Cardiac Enzymes Recent Labs  Lab 03/01/18 2250 03/02/18 0402  TROPONINI 0.04* 0.04*    Glucose Recent Labs  Lab 03/02/18 0750 03/02/18 1152 03/02/18 1823 03/02/18 2359 03/03/18 0633 03/03/18 0757  GLUCAP 168* 117* 150* 68* 137* 156*    Imaging No  results found.   ASSESSMENT / PLAN: SBO/ileus-improving Severe hypertension, controlled on nicardipine infusion DM2, controlled  Initiate clear liquids Initiate oral antihypertensives Wean nicardipine to off as able maintaining SBP <160 mmHg Continue SSI.  Changed to ACHS If able to transition off of nicardipine infusion, will transfer to MedSurg later today   Merton Border, MD PCCM service Mobile [phone] Pager [phone] 03/03/2018 11:08 AM   ADD: Did not tolerate clear liquids.  Caused abdominal pain and nausea.  Clear liquids discontinued.  NGT placed  Merton Border, MD PCCM service Mobile [phone] Pager [phone] 03/03/2018 1:40 PM

## 2018-03-03 NOTE — Significant Event (Signed)
Hypoglycemic Event  CBG: 66 @ 2144  Treatment: D50 IV 25 mL  Symptoms: None  Follow-up CBG: Time:2213 CBG Result: 128  Possible Reasons for Event: Inadequate meal intake  Comments/MD notified:[name] notified of hypoglycemic event. Order to follow protocol. Maintenance fluid of D5W was also increased from 25 mL/hr to 50 mL/hr    Ordean Fouts

## 2018-03-03 NOTE — Progress Notes (Signed)
Subjective:  Late entry  Tara [name] is a 84 y.o. female  Hospital stay day 1,   SBO  No acute issues overnight.  Decided against NG yesterday but still did ok.  No flatus or BM but thinks she feels less distended   Objective:      Temp:  [99 F (37.2 C)-100.1 F (37.8 C)] 100.1 F (37.8 C) (07/24 1518) Pulse Rate:  [73-93] 93 (07/24 1830) Resp:  [14-35] 20 (07/24 1830) BP: (131-175)/(50-125) 153/65 (07/24 1830) SpO2:  [86 %-97 %] 96 % (07/24 1830)     Height: 4\' 11"  (149.9 cm) Weight: 46.3 kg (102 lb 1.2 oz) BMI (Calculated): 20.61   Intake/Output this shift:  Total I/O In: 133.3 [I.V.:133.3] Out: 30 [Urine:30]   Intake/Output last 2 shifts:    Constitutional :  alert, cooperative and appears stated age  Respiratory:  clear to auscultation bilaterally  Cardiovascular:  regular rate and rhythm, S1, S2 normal, no murmur, click, rub or gallop  Gastrointestinal: soft, minimal tenderness, less distended than previous exam..   Skin: Cool and moist.   Psychiatric: Normal affect, non-agitated, not confused       LABS:  CMP Latest Ref Rng & Units 03/03/2018 03/01/2018 12/01/2017  Glucose 70 - 99 mg/dL 160(H) 234(H) 113(H)  BUN 8 - 23 mg/dL 30(H) 31(H) 24(H)  Creatinine 0.44 - 1.00 mg/dL 1.04(H) 0.96 0.75  Sodium 135 - 145 mmol/L 142 139 140  Potassium 3.5 - 5.1 mmol/L 4.3 4.4 3.9  Chloride 98 - 111 mmol/L 110 106 107  CO2 22 - 32 mmol/L 25 26 27   Calcium 8.9 - 10.3 mg/dL 8.5(L) 8.9 8.7  Total Protein 6.5 - 8.1 g/dL - 7.9 6.8  Total Bilirubin 0.3 - 1.2 mg/dL - 0.5 0.2  Alkaline Phos 38 - 126 U/L - 45 56  AST 15 - 41 U/L - 34 20  ALT 0 - 44 U/L - 15 12   CBC Latest Ref Rng & Units 03/01/2018 12/31/2017 12/01/2017  WBC 3.6 - 11.0 K/uL 7.0 7.7 6.2  Hemoglobin 12.0 - 16.0 g/dL 12.1 10.9(L) 10.3(L)  Hematocrit 35.0 - 47.0 % 36.2 33.5(L) 31.4(L)  Platelets 150 - 440 K/uL 257 288.0 298.0    RADS: none Assessment:        sbo- clincially improving exam even though no  return of BM/flatus yet.  Continue to encourage ambulation. Plan:

## 2018-03-03 NOTE — Progress Notes (Signed)
[facility] NAME: Tara [name]    MR#:  [id number]  DATE OF BIRTH:  1918-06-28  SUBJECTIVE:   Patient without abdominal pain this morning.  No nausea.  She has a diet ordered  REVIEW OF SYSTEMS:    Review of Systems  Constitutional: Negative for fever, chills weight loss HENT: Negative for ear pain, nosebleeds, congestion, facial swelling, rhinorrhea, neck pain, neck stiffness and ear discharge.   Respiratory: Negative for cough, shortness of breath, wheezing  Cardiovascular: Negative for chest pain, palpitations and leg swelling.  Gastrointestinal: Negative for heartburn, abdominal pain, vomiting, diarrhea or consitpation Genitourinary: Negative for dysuria, urgency, frequency, hematuria Musculoskeletal: Negative for back pain or joint pain Neurological: Negative for dizziness, seizures, syncope, focal weakness,  numbness and headaches.  Hematological: Does not bruise/bleed easily.  Psychiatric/Behavioral: Negative for hallucinations, confusion, dysphoric mood    Tolerating Diet: yes      DRUG ALLERGIES:   Allergies  Allergen Reactions  . Ace Inhibitors Other (See Comments)    Angioedema  . Hydrochlorothiazide W-Triamterene Other (See Comments)    Gout flare  . Lisinopril Swelling and Other (See Comments)  . Penicillins Swelling  . Trichlormethiazide Other (See Comments)    Gout flare    VITALS:  Blood pressure (!) 158/92, pulse 89, temperature 99 F (37.2 C), temperature source Oral, resp. rate (!) 21, height 4\' 11"  (1.499 m), weight 46.3 kg (102 lb 1.2 oz), SpO2 94 %.  PHYSICAL EXAMINATION:  Constitutional: Appears well-developed and well-nourished. No distress. HENT: Normocephalic. Marland Kitchen Oropharynx is clear and moist.  Eyes: Conjunctivae and EOM are normal. PERRLA, no scleral icterus.  Neck: Normal ROM. Neck supple. No JVD. No tracheal deviation. CVS: RRR, S1/S2 +, no murmurs, no gallops, no carotid bruit.   Pulmonary: Effort and breath sounds normal, no stridor, rhonchi, wheezes, rales.  Abdominal: Soft. BS +,  no distension, tenderness, rebound or guarding.  Musculoskeletal: Normal range of motion. No edema and no tenderness.  Neuro: Alert. CN 2-12 grossly intact. No focal deficits. Skin: Skin is warm and dry. No rash noted. Psychiatric: Normal mood and affect.      LABORATORY PANEL:   CBC Recent Labs  Lab 03/01/18 2250  WBC 7.0  HGB 12.1  HCT 36.2  PLT 257   ------------------------------------------------------------------------------------------------------------------  Chemistries  Recent Labs  Lab 03/01/18 2250 03/03/18 0619  NA 139 142  K 4.4 4.3  CL 106 110  CO2 26 25  GLUCOSE 234* 160*  BUN 31* 30*  CREATININE 0.96 1.04*  CALCIUM 8.9 8.5*  AST 34  --   ALT 15  --   ALKPHOS 45  --   BILITOT 0.5  --    ------------------------------------------------------------------------------------------------------------------  Cardiac Enzymes Recent Labs  Lab 03/01/18 2250 03/02/18 0402  TROPONINI 0.04* 0.04*   ------------------------------------------------------------------------------------------------------------------  RADIOLOGY:  Ct Angio Abd/pel W And/or Wo Contrast  Result Date: 03/02/2018 CLINICAL DATA:  Generalized abdominal pain EXAM: CTA ABDOMEN AND PELVIS wITHOUT AND WITH CONTRAST TECHNIQUE: Multidetector CT imaging of the abdomen and pelvis was performed using the standard protocol during bolus administration of intravenous contrast. Multiplanar reconstructed images and MIPs were obtained and reviewed to evaluate the vascular anatomy. CONTRAST:  12mL ISOVUE-370 IOPAMIDOL (ISOVUE-370) INJECTION 76% COMPARISON:  CT 06/26/2017 FINDINGS: VASCULAR Aorta: Moderate severe aortic atherosclerosis. No aneurysm or dissection. Celiac: Heavily calcified at the origin suspected moderate stenosis at the origin. SMA: Moderate calcification at the origin. Suspected  moderate stenosis at the origin of the  SMA. Distal vascular patency. Renals: Single right and single left renal arteries with dense calcification at the origins. Mild to moderate stenosis suspected at the origin of right renal artery. Suspected moderate stenosis at the origin of the left renal artery. IMA: Calcified at the origin.  Distal patency. Inflow: Moderate severe aortic and common iliac disease without occlusion or high-grade focal stenosis. Moderate disease of the internal iliac arteries. No significant stenosis of the external iliac vessels. Proximal Outflow: No significant stenosis at the common femoral or imaged portions of the superficial femoral and profunda arteries. Moderate aortic atherosclerosis. Review of the MIP images confirms the above findings. NON-VASCULAR Lower chest: Lung bases demonstrate no acute consolidation or effusion. There is cardiomegaly. Hepatobiliary: Subcentimeter hypodense liver lesions too small to characterize. Stones in the gallbladder. No biliary enlargement Pancreas: Unremarkable. No pancreatic ductal dilatation or surrounding inflammatory changes. Spleen: Normal in size without focal abnormality. Adrenals/Urinary Tract: Adrenal glands are within normal limits. No hydronephrosis. Subcentimeter cortical hypodense renal lesions too small to further characterize. Bladder unremarkable Stomach/Bowel: Stomach is nonenlarged. Fluid-filled borderline enlarged loops of small bowel, mostly within the pelvis. No significant bowel wall thickening. Poorly identified appendix. Possible fluid-filled bowel in the right groin, no evidence for incarceration. Lymphatic: No significantly enlarged lymph nodes. Reproductive: No adnexal mass. Other: No free air.  Small amount of free fluid in the pelvis. Musculoskeletal: Scoliosis and degenerative changes of the spine. No acute or suspicious abnormality. Diffuse anasarca. IMPRESSION: VASCULAR Negative for aortic dissection or acute aortic  occlusive disease. Moderate severe aortic atherosclerosis with stenosis of branch vessels in the abdomen and pelvis as detailed above. NON-VASCULAR 1. Borderline fluid-filled enlarged loops distal small bowel primarily within the lower abdomen and pelvis without well-defined transition point. Findings could be secondary to ileus or low-grade obstruction. No free air. 2. Gallstones 3. Small amount of free fluid in the pelvis 4. Cardiomegaly Electronically Signed   By: Donavan Foil M.D.   On: 03/02/2018 00:59   US Abdomen Limited Ruq  Result Date: 03/02/2018 CLINICAL DATA:  Generalized abdominal pain with nausea EXAM: ULTRASOUND ABDOMEN LIMITED RIGHT UPPER QUADRANT COMPARISON:  CT 03/01/2018 FINDINGS: Gallbladder: Small amount of echogenic sludge. Negative sonographic Murphy. Small stones in the gallbladder. Normal wall thickness. Common bile duct: Diameter: 2.9 mm Liver: Hepatic echogenicity within normal limits. Small cysts within the liver measuring up to 1.1 cm. Portal vein is patent on color Doppler imaging with normal direction of blood flow towards the liver. Tiny amount of fluid adjacent to the liver. Tiny amount of right perinephric fluid. IMPRESSION: 1. Small stones and sludge in the gallbladder. No evidence for acute cholecystitis or biliary dilatation. 2. Trace amount of fluid adjacent to the liver and right kidney 3. Small cysts within the liver Electronically Signed   By: Donavan Foil M.D.   On: 03/02/2018 03:32     ASSESSMENT AND PLAN:   84 year old female with history of diabetes who is admitted due to abdominal pain   1.  SBO versus ileus: Patient symptoms seem to have improved.  She is tolerating diet.  Surgery evaluation is appreciated.  2.  Accelerated essential hypertension: Patient is being transitioned off of nicardipine drip to oral medications. Continue Norvasc, metoprolol and hydralazine  3.  Hypothyroid is not: Continue Synthroid  4.  Diabetes: Continue sliding  scale PT eval for d/c planning   Management plans discussed with the patient and she is in agreement.  CODE STATUS: full  TOTAL TIME TAKING CARE OF THIS PATIENT:  27 minutes.     POSSIBLE D/C tomorrow, DEPENDING ON CLINICAL CONDITION.   Mantaj Chamberlin M.D on 03/03/2018 at 1:08 PM  Between 7am to 6pm - Pager - [phone] After 6pm go to [email or web address] - password [email or web address] [facility]  Office  [phone]  CC: Primary care physician; Einar Pheasant, MD  Note: This dictation was prepared with Dragon dictation along with smaller phrase technology. Any transcriptional errors that result from this process are unintentional.

## 2018-03-04 ENCOUNTER — Inpatient Hospital Stay: Payer: Medicare Other

## 2018-03-04 ENCOUNTER — Telehealth: Payer: Self-pay | Admitting: Internal Medicine

## 2018-03-04 LAB — BASIC METABOLIC PANEL
ANION GAP: 6 (ref 5–15)
BUN: 33 mg/dL — ABNORMAL HIGH (ref 8–23)
CALCIUM: 7.9 mg/dL — AB (ref 8.9–10.3)
CO2: 27 mmol/L (ref 22–32)
Chloride: 111 mmol/L (ref 98–111)
Creatinine, Ser: 1.25 mg/dL — ABNORMAL HIGH (ref 0.44–1.00)
GFR, EST AFRICAN AMERICAN: 40 mL/min — AB (ref >60)
GFR, EST NON AFRICAN AMERICAN: 35 mL/min — AB (ref >60)
GLUCOSE: 149 mg/dL — AB (ref 70–99)
Potassium: 3.9 mmol/L (ref 3.5–5.1)
Sodium: 144 mmol/L (ref 135–145)

## 2018-03-04 LAB — GLUCOSE, CAPILLARY
GLUCOSE-CAPILLARY: 92 mg/dL (ref 70–99)
GLUCOSE-CAPILLARY: 133 mg/dL — AB (ref 70–99)
Glucose-Capillary: 155 mg/dL — ABNORMAL HIGH (ref 70–99)

## 2018-03-04 LAB — PHOSPHORUS: PHOSPHORUS: 4.1 mg/dL (ref 2.5–4.6)

## 2018-03-04 MED ORDER — ORAL CARE MOUTH RINSE
15.0000 mL | Freq: Two times a day (BID) | OROMUCOSAL | Status: DC
  Administered 2018-03-05 – 2018-03-13 (×8): 15 mL via OROMUCOSAL

## 2018-03-04 MED ORDER — BISACODYL 10 MG RE SUPP
10.0000 mg | Freq: Once | RECTAL | Status: AC
  Administered 2018-03-04: 10 mg via RECTAL
  Filled 2018-03-04: qty 1

## 2018-03-04 MED ORDER — METOPROLOL TARTRATE 5 MG/5ML IV SOLN
2.5000 mg | Freq: Four times a day (QID) | INTRAVENOUS | Status: DC
  Administered 2018-03-04 – 2018-03-07 (×9): 2.5 mg via INTRAVENOUS
  Filled 2018-03-04 (×11): qty 5

## 2018-03-04 MED ORDER — CHLORHEXIDINE GLUCONATE 0.12 % MT SOLN
15.0000 mL | Freq: Two times a day (BID) | OROMUCOSAL | Status: DC
  Administered 2018-03-04 – 2018-03-16 (×20): 15 mL via OROMUCOSAL
  Filled 2018-03-04 (×21): qty 15

## 2018-03-04 MED ORDER — HYDRALAZINE HCL 20 MG/ML IJ SOLN
10.0000 mg | Freq: Four times a day (QID) | INTRAMUSCULAR | Status: DC
  Administered 2018-03-04 – 2018-03-07 (×12): 10 mg via INTRAVENOUS
  Filled 2018-03-04 (×13): qty 1

## 2018-03-04 MED ORDER — INSULIN ASPART 100 UNIT/ML ~~LOC~~ SOLN: 0.0000 [IU] | SUBCUTANEOUS | Status: DC

## 2018-03-04 MED ORDER — LACTATED RINGERS IV BOLUS: 500.0000 mL | Freq: Once | INTRAVENOUS | Status: DC

## 2018-03-04 MED ORDER — LACTATED RINGERS IV SOLN
INTRAVENOUS | Status: AC
  Administered 2018-03-04 – 2018-03-05 (×2): via INTRAVENOUS

## 2018-03-04 MED ORDER — NITROGLYCERIN 0.1 MG/HR TD PT24
0.1000 mg | MEDICATED_PATCH | Freq: Every day | TRANSDERMAL | Status: DC
  Administered 2018-03-04 – 2018-03-06 (×3): 0.1 mg via TRANSDERMAL
  Filled 2018-03-04 (×3): qty 1

## 2018-03-04 NOTE — Evaluation (Signed)
Physical Therapy Evaluation Patient Details Name: Tara [name] MRN: [id number] DOB: 10-18-1917 Today's Date: 03/04/2018   History of Present Illness  Pt is a 84 year old female with history of diabetes who is admitted due to abdominal pain with NG tube placed.  Assessment includes:  SBO versus ileus, accelerated HTN, hypothyroidism, and DM.    Clinical Impression  Pt presents with deficits in strength, transfers, mobility, gait, balance, and activity tolerance.  Pt required min A with bed mobility tasks and transfers.  Upon initial stand pt required min A to prevent LOB.  Pt able to amb 10' with a RW with min A for stability with no adverse symptoms reported.  Pt tolerated below therex well with no c/o abdominal pain and with SpO2 and HR WNL on 2LO2/min.  Pt will benefit from PT services in a SNF setting upon discharge to safely address above deficits for decreased caregiver assistance and eventual return to PLOF.      Follow Up Recommendations SNF;Supervision for mobility/OOB    Equipment Recommendations  None recommended by PT    Recommendations for Other Services       Precautions / Restrictions Precautions Precautions: Fall Restrictions Weight Bearing Restrictions: No      Mobility  Bed Mobility Overal bed mobility: Needs Assistance Bed Mobility: Supine to Sit;Sit to Supine     Supine to sit: Min assist Sit to supine: Min assist   General bed mobility comments: Min verbal cues for sequencing  Transfers Overall transfer level: Needs assistance Equipment used: Rolling walker (2 wheeled) Transfers: Sit to/from Stand Sit to Stand: Min assist;From elevated surface;+2 safety/equipment         General transfer comment: Mod verbal cues for sequencing with pt unable to come to standing independently even from an elevated surface; min A for stability upon initial stand  Ambulation/Gait Ambulation/Gait assistance: Min guard;+2 safety/equipment Gait Distance (Feet): 10  Feet Assistive device: Rolling walker (2 wheeled) Gait Pattern/deviations: Step-through pattern;Decreased step length - right;Decreased step length - left     General Gait Details: Short B step length with slow cadence with min instability that pt was able to self-correct  Stairs            Wheelchair Mobility    Modified Rankin (Stroke Patients Only)       Balance Overall balance assessment: Needs assistance Sitting-balance support: Bilateral upper extremity supported Sitting balance-Leahy Scale: Fair     Standing balance support: Bilateral upper extremity supported Standing balance-Leahy Scale: Poor Standing balance comment: Min A to prevent LOB in standing                             Pertinent Vitals/Pain Pain Assessment: No/denies pain    Home Living Family/patient expects to be discharged to:: Private residence Living Arrangements: Alone Available Help at Discharge: Family;Available PRN/intermittently Type of Home: House Home Access: Ramped entrance     Home Layout: One level Home Equipment: Walker - 2 wheels;Walker - 4 wheels      Prior Function Level of Independence: Needs assistance   Gait / Transfers Assistance Needed: Mod Ind with amb with a RW limited community distances, Ind with bed mobility and transfers, no recent fall history  ADL's / Homemaking Assistance Needed: Daughter assists with meals, errands, meds, bathing, and dressing        Hand Dominance        Extremity/Trunk Assessment   Upper Extremity Assessment Upper Extremity Assessment: Generalized weakness  Lower Extremity Assessment Lower Extremity Assessment: Generalized weakness       Communication   Communication: HOH  Cognition Arousal/Alertness: Awake/alert Behavior During Therapy: Flat affect Overall Cognitive Status: Within Functional Limits for tasks assessed                                        General Comments       Exercises Total Joint Exercises Ankle Circles/Pumps: AROM;Both;5 reps;10 reps Quad Sets: Strengthening;Both;5 reps;10 reps Gluteal Sets: Strengthening;Both;10 reps;5 reps Hip ABduction/ADduction: AAROM;Both;5 reps Straight Leg Raises: Both;5 reps Long Arc Quad: AROM;Both;5 reps;10 reps Knee Flexion: Strengthening;Both;5 reps;10 reps Other Exercises Other Exercises: HEP education and review for BLE APs, QS, and GS x 10 each 5-6x/day   Assessment/Plan    PT Assessment Patient needs continued PT services  PT Problem List Decreased strength;Decreased activity tolerance;Decreased balance;Decreased mobility;Decreased knowledge of use of DME       PT Treatment Interventions DME instruction;Gait training;Functional mobility training;Balance training;Therapeutic exercise;Therapeutic activities;Patient/family education    PT Goals (Current goals can be found in the Care Plan section)  Acute Rehab PT Goals Patient Stated Goal: To return to prior living situation PT Goal Formulation: With patient Time For Goal Achievement: 03/17/18 Potential to Achieve Goals: Fair    Frequency Min 2X/week   Barriers to discharge Inaccessible home environment;Decreased caregiver support      Co-evaluation               AM-PAC PT "6 Clicks" Daily Activity  Outcome Measure Difficulty turning over in bed (including adjusting bedclothes, sheets and blankets)?: Unable Difficulty moving from lying on back to sitting on the side of the bed? : Unable Difficulty sitting down on and standing up from a chair with arms (e.g., wheelchair, bedside commode, etc,.)?: Unable Help needed moving to and from a bed to chair (including a wheelchair)?: A Little Help needed walking in hospital room?: A Little Help needed climbing 3-5 steps with a railing? : A Lot 6 Click Score: 11    End of Session Equipment Utilized During Treatment: Gait belt;Oxygen Activity Tolerance: Patient limited by fatigue Patient left: in  chair;with chair alarm set;with family/visitor present Nurse Communication: Mobility status;Other (comment)(Pt requires call bell) PT Visit Diagnosis: Unsteadiness on feet (R26.81);Muscle weakness (generalized) (M62.81);Difficulty in walking, not elsewhere classified (R26.2)    Time: 4098-1191 PT Time Calculation (min) (ACUTE ONLY): 35 min   Charges:   PT Evaluation $PT Eval Low Complexity: 1 Low PT Treatments $Therapeutic Exercise: 8-22 mins   PT G Codes:        DRoyetta Asal PT, DPT 03/04/18, 9:33 AM

## 2018-03-04 NOTE — Progress Notes (Signed)
Pt awake and alert and sat out of bed for long period this am, . IV Carden currently if off and transdermal and IV meds given and at this time BP is 144/54. PR suppository given with no effects as yet. NG tube continues to drain large amounts of dark brown material

## 2018-03-04 NOTE — Progress Notes (Signed)
PULMONARY / CRITICAL CARE MEDICINE   Name: Tara [name] MRN: [id number] DOB: [birth year]    ADMISSION DATE:  03/01/2018  PT PROFILE: Highly independent 89 F adm via ED with high grade ileus vs SBO and severe hypertension requiring nicardipine infusion   SUBJECTIVE:  Nausea and abdominal pain improved.  Still with high NGT output with feculent appearing drainage.  No new complaints.  VITAL SIGNS: BP (!) 141/48   Pulse 85   Temp 99.6 F (37.6 C) (Oral)   Resp 18   Ht 4\' 11"  (1.499 m)   Wt 102 lb 1.2 oz (46.3 kg)   SpO2 99%   BMI 20.62 kg/m   HEMODYNAMICS:    VENTILATOR SETTINGS:    INTAKE / OUTPUT: I/O last 3 completed shifts: In: 3664.2 [I.V.:3664.2] Out: 6063 [Urine:490; Emesis/NG output:2700]  PHYSICAL EXAMINATION: General: NAD Neuro: No focal deficits HEENT: NCAT, sclerae white Cardiovascular: Regular, no M Lungs: Clear to auscultation and percussion Abdomen: Nondistended, minimally tender, active bowel sounds Extremities: Warm, no edema  LABS:  BMET Recent Labs  Lab 03/01/18 2250 03/03/18 0619 03/04/18 0426  NA 139 142 144  K 4.4 4.3 3.9  CL 106 110 111  CO2 26 25 27   BUN 31* 30* 33*  CREATININE 0.96 1.04* 1.25*  GLUCOSE 234* 160* 149*    Electrolytes Recent Labs  Lab 03/01/18 2250 03/03/18 0619 03/04/18 0426  CALCIUM 8.9 8.5* 7.9*    CBC Recent Labs  Lab 03/01/18 2250  WBC 7.0  HGB 12.1  HCT 36.2  PLT 257    Coag's No results for input(s): APTT, INR in the last 168 hours.  Sepsis Markers Recent Labs  Lab 03/01/18 2321  LATICACIDVEN 1.1    ABG No results for input(s): PHART, PCO2ART, PO2ART in the last 168 hours.  Liver Enzymes Recent Labs  Lab 03/01/18 2250  AST 34  ALT 15  ALKPHOS 45  BILITOT 0.5  ALBUMIN 3.4*    Cardiac Enzymes Recent Labs  Lab 03/01/18 2250 03/02/18 0402  TROPONINI 0.04* 0.04*    Glucose Recent Labs  Lab 03/03/18 1129 03/03/18 1640 03/03/18 2144 03/03/18 2213 03/04/18 0732  03/04/18 1148  GLUCAP 162* 144* 66* 128* 155* 92    Imaging Dg Abd 1 View  Result Date: 03/03/2018 CLINICAL DATA:  NG tube placement. EXAM: ABDOMEN - 1 VIEW COMPARISON:  CT abdomen pelvis dated March 01, 2018. FINDINGS: NG tube within the stomach. Mildly dilated loops of air-filled small bowel in the central abdomen. No pneumoperitoneum. No acute osseous abnormality. IMPRESSION: 1. NG tube within the stomach. 2. Unchanged small bowel ileus/obstruction. Electronically Signed   By: Titus Dubin M.D.   On: 03/03/2018 15:59   Dg Chest Port 1 View  Result Date: 03/03/2018 CLINICAL DATA:  Fever and hypoxia. EXAM: PORTABLE CHEST 1 VIEW COMPARISON:  Chest x-ray dated October 30, 2017. FINDINGS: The patient is rotated to the right. NG tube within the stomach. Stable cardiomegaly. Normal pulmonary vascularity. Atherosclerotic calcification of the aortic arch. Small bilateral pleural effusions with bibasilar opacities. No pneumothorax. No acute osseous abnormality. IMPRESSION: 1. Small bilateral pleural effusions with bibasilar opacities, favoring atelectasis. Electronically Signed   By: Titus Dubin M.D.   On: 03/03/2018 16:01     ASSESSMENT / PLAN: SBO/ileus with high NGT output Severe hypertension, controlled on nicardipine infusion DM2, controlled  Transition off of nicardipine to scheduled IV metoprolol, hydralazine and topical NTG Dulcolax suppository X 1 ordered Once off of nicardipine, she can be transferred to [place] floor.  Once able to take POs, stop the IV antihypertensives and resume her previous regimen General surg following Continue SSI.  Scale adjusted    Merton Border, MD PCCM service Mobile [phone] Pager [phone] 03/04/2018 3:16 PM   ADD: Did not tolerate clear liquids.  Caused abdominal pain and nausea.  Clear liquids discontinued.  NGT placed  Merton Border, MD PCCM service Mobile [phone] Pager [phone] 03/04/2018 3:16 PM

## 2018-03-04 NOTE — Progress Notes (Signed)
[facility] [place] NAME: Tara [name]    MR#:  [id number]  DATE OF BIRTH:  Oct 23, 1917  SUBJECTIVE:   Patient did not tolerate her diet now has NG tube placed  REVIEW OF SYSTEMS:    Review of Systems  Constitutional: Negative for fever, chills weight loss HENT: Negative for ear pain, nosebleeds, congestion, facial swelling, rhinorrhea, neck pain, neck stiffness and ear discharge.   Respiratory: Negative for cough, shortness of breath, wheezing  Cardiovascular: Negative for chest pain, palpitations and leg swelling.  Gastrointestinal: Negative for heartburn, abdominal pain, vomiting, diarrhea or consitpation Genitourinary: Negative for dysuria, urgency, frequency, hematuria Musculoskeletal: Negative for back pain or joint pain Neurological: Negative for dizziness, seizures, syncope, focal weakness,  numbness and headaches.  Hematological: Does not bruise/bleed easily.  Psychiatric/Behavioral: Negative for hallucinations, confusion, dysphoric mood    Tolerating Diet: npo     DRUG ALLERGIES:   Allergies  Allergen Reactions  . Ace Inhibitors Other (See Comments)    Angioedema  . Hydrochlorothiazide W-Triamterene Other (See Comments)    Gout flare  . Lisinopril Swelling and Other (See Comments)  . Penicillins Swelling  . Trichlormethiazide Other (See Comments)    Gout flare    VITALS:  Blood pressure (!) 144/54, pulse 93, temperature 98.6 F (37 C), resp. rate 19, height 4\' 11"  (1.499 m), weight 102 lb 1.2 oz (46.3 kg), SpO2 96 %.  PHYSICAL EXAMINATION:  Constitutional: Appears thin and frail and has NG tube placed no distress. HENT: Normocephalic. Marland Kitchen Oropharynx is clear and moist.  Eyes: Conjunctivae and EOM are normal. PERRLA, no scleral icterus.  Neck: Normal ROM. Neck supple. No JVD. No tracheal deviation. CVS: RRR, S1/S2 +, no murmurs, no gallops, no carotid bruit.  Pulmonary: Effort and breath sounds normal, no stridor,  rhonchi, wheezes, rales.  Abdominal: no BS  ++distension, mild tenderness, NO rebound or guarding.  Musculoskeletal: Normal range of motion. No edema and no tenderness.  Neuro: Alert. CN 2-12 grossly intact. No focal deficits. Skin: Skin is warm and dry. No rash noted. Psychiatric: Normal mood and affect.      LABORATORY PANEL:   CBC Recent Labs  Lab 03/01/18 2250  WBC 7.0  HGB 12.1  HCT 36.2  PLT 257   ------------------------------------------------------------------------------------------------------------------  Chemistries  Recent Labs  Lab 03/01/18 2250  03/04/18 0426  NA 139   < > 144  K 4.4   < > 3.9  CL 106   < > 111  CO2 26   < > 27  GLUCOSE 234*   < > 149*  BUN 31*   < > 33*  CREATININE 0.96   < > 1.25*  CALCIUM 8.9   < > 7.9*  AST 34  --   --   ALT 15  --   --   ALKPHOS 45  --   --   BILITOT 0.5  --   --    < > = values in this interval not displayed.   ------------------------------------------------------------------------------------------------------------------  Cardiac Enzymes Recent Labs  Lab 03/01/18 2250 03/02/18 0402  TROPONINI 0.04* 0.04*   ------------------------------------------------------------------------------------------------------------------  RADIOLOGY:  Dg Abd 1 View  Result Date: 03/03/2018 CLINICAL DATA:  NG tube placement. EXAM: ABDOMEN - 1 VIEW COMPARISON:  CT abdomen pelvis dated March 01, 2018. FINDINGS: NG tube within the stomach. Mildly dilated loops of air-filled small bowel in the central abdomen. No pneumoperitoneum. No acute osseous abnormality. IMPRESSION: 1. NG tube within the stomach. 2.  Unchanged small bowel ileus/obstruction. Electronically Signed   By: Titus Dubin M.D.   On: 03/03/2018 15:59   Dg Chest Port 1 View  Result Date: 03/03/2018 CLINICAL DATA:  Fever and hypoxia. EXAM: PORTABLE CHEST 1 VIEW COMPARISON:  Chest x-ray dated October 30, 2017. FINDINGS: The patient is rotated to the right. NG tube  within the stomach. Stable cardiomegaly. Normal pulmonary vascularity. Atherosclerotic calcification of the aortic arch. Small bilateral pleural effusions with bibasilar opacities. No pneumothorax. No acute osseous abnormality. IMPRESSION: 1. Small bilateral pleural effusions with bibasilar opacities, favoring atelectasis. Electronically Signed   By: Titus Dubin M.D.   On: 03/03/2018 16:01     ASSESSMENT AND PLAN:   84 year old female with history of diabetes who is admitted due to abdominal pain   1.  SBO: Continue with NG tube and management as per surgery  2.  Accelerated essential hypertension: Patient ioff of nicardipine drip to oral medications. Continue IV metoprolol and hydralazine   3.  Hypothyroid: Continue Synthroid  4.  Diabetes: Continue sliding scale  5.  Acute kidney injury in the setting of SBO: Continue IV fluids and repeat BMP for a.m.  6.  Acute hypoxic respiratory failure in the setting of atelectasis: ISS if tolerated.  Physical therapy is recommending skilled nursing facility upon discharge.  Clinical social worker consultation placed.  Discussed with surgical services today.   Management plans discussed with the patient and daughter and she is in agreement.  CODE STATUS: full  TOTAL TIME TAKING CARE OF THIS PATIENT: 27 minutes.     POSSIBLE D/C tomorrow, DEPENDING ON CLINICAL CONDITION.   Terilynn Buresh M.D on 03/04/2018 at 1:14 PM  Between 7am to 6pm - Pager - [phone] After 6pm go to [email or web address] - password [email or web address] [facility]  Office  [phone]  CC: Primary care physician; Einar Pheasant, MD  Note: This dictation was prepared with Dragon dictation along with smaller phrase technology. Any transcriptional errors that result from this process are unintentional.

## 2018-03-04 NOTE — Plan of Care (Signed)
  Problem: Clinical Measurements: Goal: Ability to maintain clinical measurements within normal limits will improve Outcome: Progressing Goal: Respiratory complications will improve Outcome: Progressing Goal: Cardiovascular complication will be avoided Outcome: Progressing   Problem: Safety: Goal: Ability to remain free from injury will improve Outcome: Progressing   Problem: Skin Integrity: Goal: Risk for impaired skin integrity will decrease Outcome: Progressing   Problem: Nutrition: Goal: Adequate nutrition will be maintained Outcome: Not Met (add Reason) Note:  Pt remains NPO   Problem: Elimination: Goal: Will not experience complications related to bowel motility Outcome: Not Met (add Reason) Note:  During previous shift, NG was placed for abdominal pain. This shift, pts NG had 1.1L of brown output

## 2018-03-04 NOTE — Progress Notes (Signed)
Subjective:    Tara [name] is a 84 y.o. female  Hospital stay day 2,   SBO  Unable to tolerate clears so NG tube placed.  Over 3L removed since.  States she is having worsening pain in RLQ.   Objective:      Temp:  [98.6 F (37 C)-100.4 F (38 C)] 99.6 F (37.6 C) (07/25 1959) Pulse Rate:  [82-104] 94 (07/25 1959) Resp:  [15-22] 20 (07/25 1959) BP: (123-162)/(46-64) 162/55 (07/25 1959) SpO2:  [92 %-99 %] 98 % (07/25 1959)     Height: 4\' 11"  (149.9 cm) Weight: 46.3 kg (102 lb 1.2 oz) BMI (Calculated): 20.61   Intake/Output this shift:  As above  Intake/Output last 2 shifts:     Constitutional :  alert and cooperative  Respiratory:  clear to auscultation bilaterally  Cardiovascular:  regular rate and rhythm, S1, S2 normal, no murmur, click, rub or gallop  Gastrointestinal: soft, but slightly more distended today compared to yesterday, with focal tenderness in RLQ.  NG with bilious output.   Skin: Cool and moist.   Psychiatric: Normal affect, non-agitated, not confused       LABS:  CMP Latest Ref Rng & Units 03/04/2018 03/03/2018 03/01/2018  Glucose 70 - 99 mg/dL 149(H) 160(H) 234(H)  BUN 8 - 23 mg/dL 33(H) 30(H) 31(H)  Creatinine 0.44 - 1.00 mg/dL 1.25(H) 1.04(H) 0.96  Sodium 135 - 145 mmol/L 144 142 139  Potassium 3.5 - 5.1 mmol/L 3.9 4.3 4.4  Chloride 98 - 111 mmol/L 111 110 106  CO2 22 - 32 mmol/L 27 25 26   Calcium 8.9 - 10.3 mg/dL 7.9(L) 8.5(L) 8.9  Total Protein 6.5 - 8.1 g/dL - - 7.9  Total Bilirubin 0.3 - 1.2 mg/dL - - 0.5  Alkaline Phos 38 - 126 U/L - - 45  AST 15 - 41 U/L - - 34  ALT 0 - 44 U/L - - 15   CBC Latest Ref Rng & Units 03/01/2018 12/31/2017 12/01/2017  WBC 3.6 - 11.0 K/uL 7.0 7.7 6.2  Hemoglobin 12.0 - 16.0 g/dL 12.1 10.9(L) 10.3(L)  Hematocrit 35.0 - 47.0 % 36.2 33.5(L) 31.4(L)  Platelets 150 - 440 K/uL 257 288.0 298.0    RADS: Personal review of NG tube placement imaging shows NG curling within stomach, with portion crossing midline, concern  for possible entry into duodenum. Assessment:      SBO.  Pulled NG tube back and will repeat xray for placement confirmation and also to reassess degree of bowel distention.  No concern for immediate surgical intervention at this point, but will need to continue to monitor closely.  Will restart LR and reassess urine output.  Back to NPO.  Case discussed with family members at bedside.

## 2018-03-04 NOTE — Telephone Encounter (Signed)
Return call to daughter

## 2018-03-04 NOTE — Progress Notes (Signed)
Patient resting in no apparent distress.  NG continues on LIS.  Order in chl for patient to transfer to floor care 2C with off unit tele, room assignment obtained.  Patient and family updated. Patient's belonging packed to go with patient.  Report called to receiving RN, Anabelle with no further questions.

## 2018-03-04 NOTE — Telephone Encounter (Signed)
Copied from [place] [id number]. Topic: General - Other >> Mar 04, 2018 10:05 AM Gardiner Ramus wrote: Reason for CRM: pt daughter called and stated that she is concerned about the medication that she is receiving in the hospital. Pt sugars are up and down. Also concerned about blood pressure.

## 2018-03-05 ENCOUNTER — Inpatient Hospital Stay: Payer: Self-pay

## 2018-03-05 ENCOUNTER — Inpatient Hospital Stay: Payer: Medicare Other

## 2018-03-05 LAB — BASIC METABOLIC PANEL
Anion gap: 7 (ref 5–15)
BUN: 34 mg/dL — ABNORMAL HIGH (ref 8–23)
CO2: 30 mmol/L (ref 22–32)
Calcium: 7.9 mg/dL — ABNORMAL LOW (ref 8.9–10.3)
Chloride: 106 mmol/L (ref 98–111)
Creatinine, Ser: 1.14 mg/dL — ABNORMAL HIGH (ref 0.44–1.00)
GFR calc Af Amer: 45 mL/min — ABNORMAL LOW (ref >60)
GFR calc non Af Amer: 39 mL/min — ABNORMAL LOW (ref >60)
Glucose, Bld: 114 mg/dL — ABNORMAL HIGH (ref 70–99)
Potassium: 3.6 mmol/L (ref 3.5–5.1)
Sodium: 143 mmol/L (ref 135–145)

## 2018-03-05 LAB — HEPATIC FUNCTION PANEL
ALT: 12 U/L (ref 0–44)
AST: 21 U/L (ref 15–41)
Albumin: 2 g/dL — ABNORMAL LOW (ref 3.5–5.0)
Alkaline Phosphatase: 47 U/L (ref 38–126)
BILIRUBIN TOTAL: 0.7 mg/dL (ref 0.3–1.2)
Total Protein: 5.4 g/dL — ABNORMAL LOW (ref 6.5–8.1)

## 2018-03-05 LAB — CBC
HCT: 29.6 % — ABNORMAL LOW (ref 35.0–47.0)
Hemoglobin: 10.1 g/dL — ABNORMAL LOW (ref 12.0–16.0)
MCH: 31.6 pg (ref 26.0–34.0)
MCHC: 34.1 g/dL (ref 32.0–36.0)
MCV: 92.8 fL (ref 80.0–100.0)
Platelets: 232 10*3/uL (ref 150–440)
RBC: 3.19 MIL/uL — ABNORMAL LOW (ref 3.80–5.20)
RDW: 15.4 % — ABNORMAL HIGH (ref 11.5–14.5)
WBC: 9.9 10*3/uL (ref 3.6–11.0)

## 2018-03-05 LAB — GLUCOSE, CAPILLARY
GLUCOSE-CAPILLARY: 140 mg/dL — AB (ref 70–99)
GLUCOSE-CAPILLARY: 95 mg/dL (ref 70–99)
GLUCOSE-CAPILLARY: 95 mg/dL (ref 70–99)
Glucose-Capillary: 122 mg/dL — ABNORMAL HIGH (ref 70–99)
Glucose-Capillary: 104 mg/dL — ABNORMAL HIGH (ref 70–99)

## 2018-03-05 LAB — MAGNESIUM: Magnesium: 2.3 mg/dL (ref 1.7–2.4)

## 2018-03-05 LAB — PHOSPHORUS: PHOSPHORUS: 4.2 mg/dL (ref 2.5–4.6)

## 2018-03-05 MED ORDER — LACTATED RINGERS IV SOLN
INTRAVENOUS | Status: DC
  Administered 2018-03-05 – 2018-03-07 (×3): via INTRAVENOUS

## 2018-03-05 MED ORDER — TRACE MINERALS CR-CU-MN-SE-ZN 10-1000-500-60 MCG/ML IV SOLN
INTRAVENOUS | Status: AC
  Administered 2018-03-05: 20:00:00 via INTRAVENOUS
  Filled 2018-03-05: qty 960

## 2018-03-05 MED ORDER — FAMOTIDINE IN NACL 20-0.9 MG/50ML-% IV SOLN
20.0000 mg | Freq: Every day | INTRAVENOUS | Status: DC
  Administered 2018-03-05 – 2018-03-13 (×9): 20 mg via INTRAVENOUS
  Filled 2018-03-05 (×9): qty 50

## 2018-03-05 MED ORDER — FUROSEMIDE 10 MG/ML IJ SOLN
20.0000 mg | Freq: Once | INTRAMUSCULAR | Status: AC
  Administered 2018-03-05: 20 mg via INTRAVENOUS
  Filled 2018-03-05: qty 4

## 2018-03-05 MED ORDER — INSULIN ASPART 100 UNIT/ML ~~LOC~~ SOLN
0.0000 [IU] | Freq: Four times a day (QID) | SUBCUTANEOUS | Status: DC
  Administered 2018-03-06 (×2): 3 [IU] via SUBCUTANEOUS
  Administered 2018-03-07 (×2): 2 [IU] via SUBCUTANEOUS
  Administered 2018-03-07 – 2018-03-08 (×4): 3 [IU] via SUBCUTANEOUS
  Filled 2018-03-05 (×8): qty 1

## 2018-03-05 MED ORDER — SODIUM CHLORIDE 0.9% FLUSH: 10.0000 mL | INTRAVENOUS | Status: DC | PRN

## 2018-03-05 MED ORDER — FAT EMULSION 20 % IV EMUL
180.0000 mL | INTRAVENOUS | Status: AC
  Administered 2018-03-05: 180 mL via INTRAVENOUS
  Filled 2018-03-05: qty 180

## 2018-03-05 MED ORDER — SODIUM CHLORIDE 0.9% FLUSH
10.0000 mL | Freq: Two times a day (BID) | INTRAVENOUS | Status: DC
  Administered 2018-03-05 – 2018-03-06 (×2): 20 mL
  Administered 2018-03-06 – 2018-03-07 (×3): 10 mL
  Administered 2018-03-08: 40 mL
  Administered 2018-03-09: 20 mL
  Administered 2018-03-09 – 2018-03-11 (×4): 10 mL
  Administered 2018-03-11: 20 mL
  Administered 2018-03-12 – 2018-03-16 (×7): 10 mL

## 2018-03-05 NOTE — NC FL2 (Signed)
[place] LEVEL OF CARE SCREENING TOOL     IDENTIFICATION  Patient Name: Tara [name] Birthdate: 02/09/1918 Sex: female Admission Date (Current Location): 03/01/2018  [facility] and [place] Number:  Engineering geologist and Address:         Provider Number: [id number]  Attending Physician Name and Address:  Bettey Costa, MD  Relative Name and Phone Number:       Current Level of Care: Hospital Recommended Level of Care: [place] Prior Approval Number:    Date Approved/Denied:   PASRR Number:    Discharge Plan: SNF    Current Diagnoses: Patient Active Problem List   Diagnosis Date Noted  . Abdominal pain 03/02/2018  . Hypertensive urgency, malignant 03/02/2018  . Difficulty breathing 11/03/2017  . Knee effusion, right 11/03/2017  . Fracture of superior rim of left pubis, subsequent encounter for fracture with routine healing 11/03/2017  . History of falling 11/03/2017  . Protein-calorie malnutrition, severe 11/01/2017  . Hypertensive urgency 10/31/2017  . Bilateral lower extremity edema 09/07/2017  . Right hand pain 08/25/2017  . Leg pain 08/25/2017  . Right groin hernia 07/03/2017  . Abnormal CT of the abdomen 07/03/2017  . Allergic rhinitis 06/17/2017  . Osteoporosis 03/12/2017  . Nasal obstruction 09/03/2016  . Loss of weight 03/05/2015  . Unsteady gait 07/31/2013  . Lower extremity edema 05/15/2013  . Peripheral vascular disease ([place]) 08/29/2012  . Hypertension 06/11/2012  . Hypercholesteremia 06/11/2012  . Diabetes mellitus with peripheral vascular disease ([place]) 06/11/2012  . Anemia 06/11/2012  . B12 deficiency 06/11/2012  . Hypothyroidism 06/11/2012    Orientation RESPIRATION BLADDER Height & Weight     Self, Place, Situation  Normal, O2 Incontinent Weight: 102 lb 1.2 oz (46.3 kg) Height:  4\' 11"  (149.9 cm)  BEHAVIORAL SYMPTOMS/MOOD NEUROLOGICAL BOWEL NUTRITION STATUS  (none) (none) Continent Diet(currently npo but  advance as tolerated)  AMBULATORY STATUS COMMUNICATION OF NEEDS Skin   Extensive Assist Verbally Normal                       Personal Care Assistance Level of Assistance  Bathing, Dressing Bathing Assistance: Limited assistance   Dressing Assistance: Limited assistance     Functional Limitations Info  Hearing   Hearing Info: Impaired      SPECIAL CARE FACTORS FREQUENCY  PT (By licensed PT)                    Contractures Contractures Info: Not present    Additional Factors Info  Code Status Code Status Info: full             Current Medications (03/05/2018):  This is the current hospital active medication list Current Facility-Administered Medications  Medication Dose Route Frequency Provider Last Rate Last Dose  . acetaminophen (TYLENOL) tablet 650 mg  650 mg Oral Q6H PRN Arta Silence, MD   650 mg at 03/05/18 1046   Or  . acetaminophen (TYLENOL) suppository 650 mg  650 mg Rectal Q6H PRN Arta Silence, MD      . chlorhexidine (PERIDEX) 0.12 % solution 15 mL  15 mL Mouth Rinse BID Flora Lipps, MD   15 mL at 03/05/18 0922  . enoxaparin (LOVENOX) injection 30 mg  30 mg Subcutaneous Q24H Arta Silence, MD   30 mg at 03/04/18 2319  . famotidine (PEPCID) IVPB 20 mg premix  20 mg Intravenous Daily Sakai, Isami, DO   Stopped at 03/05/18 1137  . hydrALAZINE (  APRESOLINE) injection 10 mg  10 mg Intravenous Q6H Wilhelmina Mcardle, MD   10 mg at 03/05/18 0926  . hydrALAZINE (APRESOLINE) injection 5-20 mg  5-20 mg Intravenous Q4H PRN Wilhelmina Mcardle, MD   20 mg at 03/05/18 1051  . insulin aspart (novoLOG) injection 0-9 Units  0-9 Units Subcutaneous Q4H Wilhelmina Mcardle, MD      . lactated ringers infusion   Intravenous Continuous Sakai, Isami, DO 100 mL/hr at 03/05/18 0700    . levothyroxine (SYNTHROID, LEVOTHROID) injection 25 mcg  25 mcg Intravenous Daily Wilhelmina Mcardle, MD   25 mcg at 03/05/18 [phone]  . MEDLINE mouth rinse  15 mL Mouth Rinse q12n4p  Flora Lipps, MD   15 mL at 03/05/18 1224  . metoprolol tartrate (LOPRESSOR) injection 2.5 mg  2.5 mg Intravenous Q6H Wilhelmina Mcardle, MD   2.5 mg at 03/05/18 1220  . morphine 2 MG/ML injection 1 mg  1 mg Intravenous Q3H PRN Wilhelmina Mcardle, MD   1 mg at 03/05/18 0920   Or  . morphine 2 MG/ML injection 2 mg  2 mg Intravenous Q3H PRN Wilhelmina Mcardle, MD      . nitroGLYCERIN (NITRODUR - Dosed in mg/24 hr) patch 0.1 mg  0.1 mg Transdermal Daily Wilhelmina Mcardle, MD   0.1 mg at 03/05/18 0929  . ondansetron (ZOFRAN) injection 4 mg  4 mg Intravenous Q6H PRN Arta Silence, MD   4 mg at 03/03/18 1235  . sodium chloride (OCEAN) 0.65 % nasal spray 1 spray  1 spray Each Nare PRN Awilda Bill, NP   1 spray at 03/02/18 2046     Discharge Medications: Please see discharge summary for a list of discharge medications.  Relevant Imaging Results:  Relevant Lab Results:   Additional Information ss: [id number]  Shela Leff, LCSW

## 2018-03-05 NOTE — Clinical Social Work Note (Signed)
Patient's daughter prefers [facility]. CSW spoke with Lovena Le at [facility] to see if they could extend a bed offer. Their Director of Nursing wishes to see how patient does over the weekend and will assess on Monday.  Shela Leff MSW,LCSW [phone]

## 2018-03-05 NOTE — Telephone Encounter (Signed)
Spoke with patients daughter. She was in the grocery store and could not clarify meds at this time but stated that she would like for you to review the changes that they have made in the hospital and see if there is anything that she should or should not be taking. I looked over them while I was on the phone and told her I did not see anything that was out of the ordinary but I would send to you for review.

## 2018-03-05 NOTE — Progress Notes (Signed)
Subjective:    Tara [name] is a 84 y.o. female  Hospital stay day 3,   SBO  No acute issues overnight.  NG continues to drain, dark bilious fluid   Objective:      Temp:  [99.3 F (37.4 C)-100.4 F (38 C)] 99.3 F (37.4 C) (07/26 0920) Pulse Rate:  [84-96] 88 (07/26 0920) Resp:  [16-26] 26 (07/26 0602) BP: (141-188)/(48-70) 188/70 (07/26 0920) SpO2:  [92 %-99 %] 99 % (07/26 0602)     Height: 4\' 11"  (149.9 cm) Weight: 46.3 kg (102 lb 1.2 oz) BMI (Calculated): 20.61   Intake/Output this shift:  2L bilious output  Intake/Output last 2 shifts:     Constitutional :  cooperative, appears stated age and fatigued  Respiratory:  clear to auscultation bilaterally  Cardiovascular:  regular rate and rhythm, S1, S2 normal, no murmur, click, rub or gallop  Gastrointestinal: soft, less distention, but slightly more tender especially in RLQ compared to previous exam..   Skin: Cool and moist.   Psychiatric: Normal affect, non-agitated, not confused       LABS:  CMP Latest Ref Rng & Units 03/05/2018 03/04/2018 03/03/2018  Glucose 70 - 99 mg/dL 114(H) 149(H) 160(H)  BUN 8 - 23 mg/dL 34(H) 33(H) 30(H)  Creatinine 0.44 - 1.00 mg/dL 1.14(H) 1.25(H) 1.04(H)  Sodium 135 - 145 mmol/L 143 144 142  Potassium 3.5 - 5.1 mmol/L 3.6 3.9 4.3  Chloride 98 - 111 mmol/L 106 111 110  CO2 22 - 32 mmol/L 30 27 25   Calcium 8.9 - 10.3 mg/dL 7.9(L) 7.9(L) 8.5(L)  Total Protein 6.5 - 8.1 g/dL - - -  Total Bilirubin 0.3 - 1.2 mg/dL - - -  Alkaline Phos 38 - 126 U/L - - -  AST 15 - 41 U/L - - -  ALT 0 - 44 U/L - - -   CBC Latest Ref Rng & Units 03/05/2018 03/01/2018 12/31/2017  WBC 3.6 - 11.0 K/uL 9.9 7.0 7.7  Hemoglobin 12.0 - 16.0 g/dL 10.1(L) 12.1 10.9(L)  Hematocrit 35.0 - 47.0 % 29.6(L) 36.2 33.5(L)  Platelets 150 - 440 K/uL 232 257 288.0    RADS: CLINICAL DATA:  Ileus  EXAM: ABDOMEN - 1 VIEW  COMPARISON:  03/04/2018  FINDINGS: Enteric tube is looped in the left upper quadrant consistent  with location in the body of the stomach. Decreasing gaseous distention of small bowel since previous study. Left upper quadrant small bowel remains distended. Paucity of gas in the colon with stool in the right colon. No radiopaque stones. Degenerative changes in the lumbar spine with lumbar scoliosis convex towards the right. Vascular calcifications. Old fractures of the left superior and right inferior pubic rami.  IMPRESSION: Decreasing gaseous distention of small bowel since previous study. Enteric tube is unchanged in position.   Electronically Signed   By: Lucienne Capers M.D.   On: 03/05/2018 04:58  CLINICAL DATA:  Fever  EXAM: CHEST  1 VIEW  COMPARISON:  03/03/2018  FINDINGS: Cardiac enlargement with vascular congestion. Progressive vascular congestion and bilateral edema.  Bibasilar airspace disease and small effusions left greater than right with mild progression from the prior study. NG tube in the stomach  IMPRESSION: Progression of vascular congestion and edema. Progressive atelectasis and effusion left greater than right.   Electronically Signed   By: Franchot Gallo M.D. Assessment:        SBO, images look better and output rate decreased slightly, but exam is still slightly concerning.  Will continue with  NG decompression and NPO.  May need to consider nutrition supplement via TPN.  Noted increased vasculature, edema on CXR.  Urine output still marginal at 0.54ml/kg/hr, in addition to the NG output, so will have to maintain a tight balance between resuscitation and overload.  Cr level decreased as well. Recommend current LR@100  for now.  Also consider foley placement for better urine output tracking

## 2018-03-05 NOTE — Progress Notes (Signed)
Famotidine 20 mg IV BID changed to daily based on patient's creatinine clearance.  Tawnya Crook, PharmD Pharmacy Resident  03/05/2018 10:27 AM

## 2018-03-05 NOTE — Clinical Social Work Note (Signed)
Clinical Social Work Assessment  Patient Details  Name: Tara [name] MRN: [id number] Date of Birth: Feb 03, 1918  Date of referral:  03/05/18               Reason for consult:  Discharge Planning                Permission sought to share information with:  Family Supports Permission granted to share information::  Yes, Verbal Permission Granted  Name::        Agency::     Relationship::     Contact Information:     Housing/Transportation Living arrangements for the past 2 months:  Single Family Home Source of Information:  Patient, Adult Children Patient Interpreter Needed:  None Criminal Activity/Legal Involvement Pertinent to Current Situation/Hospitalization:  No - Comment as needed Significant Relationships:  Adult Children Lives with:  Self Do you feel safe going back to the place where you live?  Yes Need for family participation in patient care:  No (Coment)  Care giving concerns:  Patient lives alone.   Social Worker assessment / plan:  Patient is very lethargic this afternoon. CSW spoke with her daughter: Jayme Cloud: [phone] outside of patient's room. Patient's daughter was on her way to leave so CSW kept assessment brief. Patient's daughter wishes to pursue rehab and states she has been to [facility] in the past. CSW has initiated bed search.   Employment status:  Retired Forensic scientist:    PT Recommendations:  [place] / Referral to community resources:     Patient/Family's Response to care:  Patient's daughter expressed appreciation for CSW assistance.  Patient/Family's Understanding of and Emotional Response to Diagnosis, Current Treatment, and Prognosis:  Patient is too lethargic to hold conversation well. Patient's daughter expressed no questions or concerns regarding current treatment plan.   Emotional Assessment Appearance:  Appears stated age Attitude/Demeanor/Rapport:  (lethargic but pleasant) Affect (typically  observed):  Calm Orientation:  Oriented to Self, Oriented to Place, Oriented to Situation Alcohol / Substance use:  Not Applicable Psych involvement (Current and /or in the community):  No (Comment)  Discharge Needs  Concerns to be addressed:  Care Coordination Readmission within the last 30 days:  No Current discharge risk:  None Barriers to Discharge:  No Barriers Identified   Shela Leff, LCSW 03/05/2018, 1:36 PM

## 2018-03-05 NOTE — Progress Notes (Signed)
PHARMACY - ADULT TOTAL PARENTERAL NUTRITION CONSULT NOTE   Pharmacy Consult for TPN Indication: bowel obstruction  Patient Measurements: Height: 4\' 11"  (149.9 cm) Weight: 98 lb 9.6 oz (44.7 kg) IBW/kg (Calculated) : 43.2 TPN AdjBW (KG): 46.3 Body mass index is 19.91 kg/m.   Assessment: 84 yo female with SBO starting on TPN  Endo: Pt with hx of DM Insulin requirements in the past 24 hours:  Lytes: K 3.6, Mag 2.3, Phos 4.2 - no supplementation at this time Renal: SCr 1.14, CrCl 18.8 ml/min   TPN Access: Order to place PICC 7/26 TPN start date: To be started 7/26 Nutritional Goals (per RD recommendation on 7/26):  Estimated Nutritional Needs:  Kcal:  1100-1300kcal/day  Protein:  60-70g/day Fluid:  >1.1L/day  Goal TPN rate is 50 ml/hr (provides 1212kcal/day, 60g/day protein, 1372ml volume)  Current Nutrition: NPO  Plan:  Clinimix E 5/15 at 41 ml/hr with MVI and trace elements in TPN 20% lipid emulsion at 15 ml/hr x 12h/day  SSI 0-9 units ordered q6h and adjust as needed  LR currently at 100 ml/hr to reduce to 60 ml/hr when TPN is started tonight  Daily weights and strict in/out  Monitor TPN labs, BG F/U in AM  Rayna Sexton L 03/05/2018,3:17 PM

## 2018-03-05 NOTE — Care Management Important Message (Signed)
Copy of signed IM left with patient in room.  

## 2018-03-05 NOTE — Telephone Encounter (Signed)
Waiting on call back from daughter to clarify meds

## 2018-03-05 NOTE — Progress Notes (Signed)
Palliative Medicine Consult Order Noted. Due to high referral volume, there will be a delay seeing this patient. Palliative Medicine Provider will return to [facility] on 03/08/18, and patient will be evaluated then. If patient discharges before they are seen, an outpatient palliative referral may be appropriate.   Please call the Palliative Medicine Team office at [phone] if recommendations are needed in the interim.  Thank you for inviting [place] to see this patient.  No charge note.  Juel Burrow, DNP, AGNP-C Palliative Medicine Team Team Phone # [phone]  Pager # [phone]

## 2018-03-05 NOTE — Progress Notes (Signed)
[facility] NAME: Tara [name]    MR#:  [id number]  DATE OF BIRTH:  1917-12-25  SUBJECTIVE:   Patient's KUB this morning shows some improvement She had a low-grade fever  REVIEW OF SYSTEMS:    Review of Systems  Constitutional: Low-grade fever   hENT: Negative for ear pain, nosebleeds, congestion, facial swelling, rhinorrhea, neck pain, neck stiffness and ear discharge.   Respiratory: Negative for cough, shortness of breath, wheezing  Cardiovascular: Negative for chest pain, palpitations and leg swelling.  Gastrointestinal: Negative for heartburn, abdominal pain, vomiting, diarrhea or consitpation Genitourinary: Negative for dysuria, urgency, frequency, hematuria Musculoskeletal: Negative for back pain or joint pain Neurological: Negative for dizziness, seizures, syncope, focal weakness,  numbness and headaches.  Hematological: Does not bruise/bleed easily.  Psychiatric/Behavioral: Negative for hallucinations, confusion, dysphoric mood    Tolerating Diet: npo     DRUG ALLERGIES:   Allergies  Allergen Reactions  . Ace Inhibitors Other (See Comments)    Angioedema  . Hydrochlorothiazide W-Triamterene Other (See Comments)    Gout flare  . Lisinopril Swelling and Other (See Comments)  . Penicillins Swelling  . Trichlormethiazide Other (See Comments)    Gout flare    VITALS:  Blood pressure (!) 180/70, pulse 91, temperature 99.8 F (37.7 C), temperature source Oral, resp. rate 17, height 4\' 11"  (1.499 m), weight 102 lb 1.2 oz (46.3 kg), SpO2 98 %.  PHYSICAL EXAMINATION:  Constitutional: Appears thin and frail and has NG tube placed no distress. HENT: Normocephalic. Marland Kitchen Oropharynx is clear and moist.  Eyes: Conjunctivae and EOM are normal. PERRLA, no scleral icterus.  Neck: Normal ROM. Neck supple. No JVD. No tracheal deviation. CVS: RRR, S1/S2 +, no murmurs, no gallops, no carotid bruit.  Pulmonary: Effort and breath  sounds normal, no stridor, rhonchi, wheezes, rales.  Abdominal: no BS  ++distension, mild tenderness, NO rebound or guarding.  Musculoskeletal: Normal range of motion. No edema and no tenderness.  Neuro: Alert. CN 2-12 grossly intact. No focal deficits. Skin: Skin is warm and dry. No rash noted. Psychiatric: Normal mood and affect.      LABORATORY PANEL:   CBC Recent Labs  Lab 03/05/18 0549  WBC 9.9  HGB 10.1*  HCT 29.6*  PLT 232   ------------------------------------------------------------------------------------------------------------------  Chemistries  Recent Labs  Lab 03/01/18 2250  03/05/18 0549  NA 139   < > 143  K 4.4   < > 3.6  CL 106   < > 106  CO2 26   < > 30  GLUCOSE 234*   < > 114*  BUN 31*   < > 34*  CREATININE 0.96   < > 1.14*  CALCIUM 8.9   < > 7.9*  MG  --   --  2.3  AST 34  --   --   ALT 15  --   --   ALKPHOS 45  --   --   BILITOT 0.5  --   --    < > = values in this interval not displayed.   ------------------------------------------------------------------------------------------------------------------  Cardiac Enzymes Recent Labs  Lab 03/01/18 2250 03/02/18 0402  TROPONINI 0.04* 0.04*   ------------------------------------------------------------------------------------------------------------------  RADIOLOGY:  Dg Chest 1 View  Result Date: 03/05/2018 CLINICAL DATA:  Fever EXAM: CHEST  1 VIEW COMPARISON:  03/03/2018 FINDINGS: Cardiac enlargement with vascular congestion. Progressive vascular congestion and bilateral edema. Bibasilar airspace disease and small effusions left greater than right with mild progression from the prior  study. NG tube in the stomach IMPRESSION: Progression of vascular congestion and edema. Progressive atelectasis and effusion left greater than right. Electronically Signed   By: Franchot Gallo M.D.   On: 03/05/2018 09:20   Dg Abd 1 View  Result Date: 03/05/2018 CLINICAL DATA:  Ileus EXAM: ABDOMEN - 1 VIEW  COMPARISON:  03/04/2018 FINDINGS: Enteric tube is looped in the left upper quadrant consistent with location in the body of the stomach. Decreasing gaseous distention of small bowel since previous study. Left upper quadrant small bowel remains distended. Paucity of gas in the colon with stool in the right colon. No radiopaque stones. Degenerative changes in the lumbar spine with lumbar scoliosis convex towards the right. Vascular calcifications. Old fractures of the left superior and right inferior pubic rami. IMPRESSION: Decreasing gaseous distention of small bowel since previous study. Enteric tube is unchanged in position. Electronically Signed   By: Lucienne Capers M.D.   On: 03/05/2018 04:58   Dg Abd 1 View  Result Date: 03/05/2018 CLINICAL DATA:  NG tube placement EXAM: ABDOMEN - 1 VIEW COMPARISON:  03/04/2018, 03/03/2018 FINDINGS: Pleural effusions and bibasilar airspace disease. Esophageal tube is looped back upon itself with the tip positioned near the GE junction. Gaseous enlargement of small bowel up to 3 cm. Vascular calcifications. IMPRESSION: 1. Esophageal tube is looped upon itself in the stomach with the tip projecting near the GE junction and directed cephalad. 2. Dilated loops of small bowel within the central abdomen and pelvis 3. Pleural effusions and basilar consolidations. Electronically Signed   By: Donavan Foil M.D.   On: 03/05/2018 00:46   Dg Abd 1 View  Result Date: 03/03/2018 CLINICAL DATA:  NG tube placement. EXAM: ABDOMEN - 1 VIEW COMPARISON:  CT abdomen pelvis dated March 01, 2018. FINDINGS: NG tube within the stomach. Mildly dilated loops of air-filled small bowel in the central abdomen. No pneumoperitoneum. No acute osseous abnormality. IMPRESSION: 1. NG tube within the stomach. 2. Unchanged small bowel ileus/obstruction. Electronically Signed   By: Titus Dubin M.D.   On: 03/03/2018 15:59   Dg Chest Port 1 View  Result Date: 03/03/2018 CLINICAL DATA:  Fever and  hypoxia. EXAM: PORTABLE CHEST 1 VIEW COMPARISON:  Chest x-ray dated October 30, 2017. FINDINGS: The patient is rotated to the right. NG tube within the stomach. Stable cardiomegaly. Normal pulmonary vascularity. Atherosclerotic calcification of the aortic arch. Small bilateral pleural effusions with bibasilar opacities. No pneumothorax. No acute osseous abnormality. IMPRESSION: 1. Small bilateral pleural effusions with bibasilar opacities, favoring atelectasis. Electronically Signed   By: Titus Dubin M.D.   On: 03/03/2018 16:01   Dg Abd Portable 1v  Result Date: 03/04/2018 CLINICAL DATA:  Small bowel obstruction. EXAM: PORTABLE ABDOMEN - 1 VIEW COMPARISON:  Radiograph of March 03, 2018. FINDINGS: Nasogastric tube is seen looped within the stomach with distal tip in proximal stomach. Improved mild small bowel dilatation is noted concerning for distal small bowel obstruction. Vascular calcifications are noted. IMPRESSION: Nasogastric tube tip seen within proximal stomach. Decreased small bowel dilatation is noted suggesting improving distal small bowel obstruction or ileus. Electronically Signed   By: Marijo Conception, M.D.   On: 03/04/2018 20:53     ASSESSMENT AND PLAN:   84 year old female with history of diabetes who is admitted due to abdominal pain   1.  SFK:CLEXNTZG today on XRAY Continue with NG tube and management as per surgery  2.  Accelerated essential hypertension: Patient off of nicardipine drip to oral medications. Continue  IV metoprolol and hydralazine , nitroglycerin  3.  Hypothyroid: Continue Synthroid  4.  Diabetes: Continue sliding scale  5.  Acute kidney injury in the setting of SBO: Continue IV fluids and repeat BMP for a.m.  6.  Acute hypoxic respiratory failure in the setting of atelectasis/pulmonary edema: ISS if tolerated. Lasix 20 mg IV x1 Will not order echo at this time given age of 43.  Physical therapy is recommending skilled nursing facility upon discharge.   Clinical social worker consultation placed.     Management plans discussed with the patient and daughter and she is in agreement.  CODE STATUS: full  TOTAL TIME TAKING CARE OF THIS PATIENT: 27 minutes.     POSSIBLE D/C 2-5 days DEPENDING ON CLINICAL CONDITION.   Khyrie Masi M.D on 03/05/2018 at 12:00 PM  Between 7am to 6pm - Pager - [phone] After 6pm go to [email or web address] - password [email or web address] [facility]  Office  [phone]  CC: Primary care physician; Einar Pheasant, MD  Note: This dictation was prepared with Dragon dictation along with smaller phrase technology. Any transcriptional errors that result from this process are unintentional.

## 2018-03-05 NOTE — Progress Notes (Signed)
Pt refuses to ambulate and sit in chair due pt stating "she feels to weak." RN educated pt the importance of ambulation. RN will continue to encourage ambulation.  Triston Lisanti [employer]

## 2018-03-05 NOTE — Progress Notes (Signed)
Peripherally Inserted Central Catheter/Midline Placement  The IV Nurse has discussed with the patient and/or persons authorized to consent for the patient, the purpose of this procedure and the potential benefits and risks involved with this procedure.  The benefits include less needle sticks, lab draws from the catheter, and the patient may be discharged home with the catheter. Risks include, but not limited to, infection, bleeding, blood clot (thrombus formation), and puncture of an artery; nerve damage and irregular heartbeat and possibility to perform a PICC exchange if needed/ordered by physician.  Alternatives to this procedure were also discussed.  Bard Power PICC patient education guide, fact sheet on infection prevention and patient information card has been provided to patient /or left at bedside.    PICC/Midline Placement Documentation  PICC Double Lumen 03/05/18 PICC Right Brachial 34 cm 0 cm (Active)  Indication for Insertion or Continuance of Line Administration of hyperosmolar/irritating solutions (i.e. TPN, Vancomycin, etc.) 03/05/2018  6:49 PM  Exposed Catheter (cm) 0 cm 03/05/2018  6:49 PM  Site Assessment Clean;Dry;Intact 03/05/2018  6:49 PM  Lumen #1 Status Flushed;Saline locked;Blood return noted 03/05/2018  6:49 PM  Lumen #2 Status Flushed;Saline locked;Blood return noted 03/05/2018  6:49 PM  Dressing Type Transparent 03/05/2018  6:49 PM  Dressing Status Clean;Dry;Intact;Antimicrobial disc in place 03/05/2018  6:49 PM  Dressing Intervention New dressing 03/05/2018  6:49 PM  Dressing Change Due 03/12/18 03/05/2018  6:49 PM       Remberto Lienhard, Nicolette Bang 03/05/2018, 6:50 PM

## 2018-03-05 NOTE — Progress Notes (Signed)
Initial Nutrition Assessment  DOCUMENTATION CODES:   Severe malnutrition in context of chronic illness  INTERVENTION:   Initiate Clinimix 5/15 with electrolytes at 47m/hr + 20% lipids @15ml /hr x 12 hrs/day (Advance to goal rate 526mhr 7/27 if labs stable)   Regimen provides 1212kcal/day, 60g/day protein, 138067molume    Add MVI daily   Add trace elements daily    Pt at high refeeding risk; monitor P, K, and Mg daily     Daily weights   Per MD, keep total IVF rate between TPN + LRS @100ml /hr- will decrease LRS to 60m26m today  NUTRITION DIAGNOSIS:   Severe Malnutrition related to chronic illness(DM, advanced age) as evidenced by severe fat depletion, severe muscle depletion.  GOAL:   Patient will meet greater than or equal to 90% of their needs  MONITOR:   PO intake, Supplement acceptance, Labs, Weight trends, Skin, I & O's  REASON FOR ASSESSMENT:   Consult New TPN/TNA  ASSESSMENT:   89 y80 female with h/o DM, HTN admitted with SBO   Met with pt in room today. Pt reports feeling terrible today r/t abdominal pain, distension and nausea. Pt NPO with NGT on place; 2050ml53mput x 24 hrs. Pt has been on NPO/clear liquid diet for 5 days but reports pta, she was eating "my usual". Pt with severe muscle and fat depletions; RD suspects pt does not eat well at baseline. Per chart, pt appears to have had some weight gain over the past 6 months. Spoke to MD, plan to initiate TPN today. PICC line ordered. Pt likely at moderate to high refeeding risk; monitor labs. Palliative care consult pending.   Medications reviewed and include: lovenox, insulin, synthroid, pepcid, LRS @100ml /hr   Labs reviewed: BUN 34(H), creat 1.14(H), Ca 7.9(L), Mg 2.3 wnl P 4.1 wnl- 7/25 Hgb 10.1(L), Hct 29.6(L) cbgs- 140, 122, 95, 104 x 24 hrs  NUTRITION - FOCUSED PHYSICAL EXAM:    Most Recent Value  Orbital Region  No depletion  Upper Arm Region  Severe depletion  Thoracic and Lumbar Region   Severe depletion  Buccal Region  Moderate depletion  Temple Region  No depletion  Clavicle Bone Region  Mild depletion  Clavicle and Acromion Bone Region  Mild depletion  Scapular Bone Region  Mild depletion  Dorsal Hand  Severe depletion  Patellar Region  Severe depletion  Anterior Thigh Region  Severe depletion  Posterior Calf Region  Severe depletion  Edema (RD Assessment)  None  Hair  Reviewed  Eyes  Reviewed  Mouth  Reviewed  Skin  Reviewed  Nails  Reviewed     Diet Order:   Diet Order           Diet NPO time specified  Diet effective now         EDUCATION NEEDS:   Education needs have been addressed  Skin:  Skin Assessment: Reviewed RN Assessment  Last BM:  PTA  Height:   Ht Readings from Last 1 Encounters:  03/02/18 4' 11"  (1.499 m)    Weight:   Wt Readings from Last 1 Encounters:  03/05/18 101 lb 11.2 oz (46.1 kg)    Ideal Body Weight:  44.5 kg  BMI:  Body mass index is 20.54 kg/m.  Estimated Nutritional Needs:   Kcal:  1100-1300kcal/day   Protein:  60-70g/day  Fluid:  >1.1L/day   CaseyKoleen DistanceRD, LDN Pager #- [phone]#- [phone] Hours Pager: [phone]

## 2018-03-06 ENCOUNTER — Inpatient Hospital Stay: Payer: Medicare Other

## 2018-03-06 DIAGNOSIS — I161 Hypertensive emergency: Secondary | ICD-10-CM

## 2018-03-06 LAB — URINALYSIS, COMPLETE (UACMP) WITH MICROSCOPIC
Bilirubin Urine: NEGATIVE
GLUCOSE, UA: NEGATIVE mg/dL
Hgb urine dipstick: NEGATIVE
KETONES UR: NEGATIVE mg/dL
Leukocytes, UA: NEGATIVE
NITRITE: NEGATIVE
PH: 7 (ref 5.0–8.0)
Protein, ur: 100 mg/dL — AB
SPECIFIC GRAVITY, URINE: 1.006 (ref 1.005–1.030)

## 2018-03-06 LAB — DIFFERENTIAL
Basophils Absolute: 0.1 10*3/uL (ref 0–0.1)
Basophils Relative: 1 %
Eosinophils Absolute: 0.4 10*3/uL (ref 0–0.7)
Eosinophils Relative: 4 %
LYMPHS PCT: 19 %
Lymphs Abs: 1.8 10*3/uL (ref 1.0–3.6)
MONO ABS: 0.8 10*3/uL (ref 0.2–0.9)
MONOS PCT: 8 %
NEUTROS ABS: 6.5 10*3/uL (ref 1.4–6.5)
Neutrophils Relative %: 68 %

## 2018-03-06 LAB — GLUCOSE, CAPILLARY
GLUCOSE-CAPILLARY: 155 mg/dL — AB (ref 70–99)
GLUCOSE-CAPILLARY: 238 mg/dL — AB (ref 70–99)
GLUCOSE-CAPILLARY: 180 mg/dL — AB (ref 70–99)
GLUCOSE-CAPILLARY: 230 mg/dL — AB (ref 70–99)
Glucose-Capillary: 163 mg/dL — ABNORMAL HIGH (ref 70–99)
Glucose-Capillary: 210 mg/dL — ABNORMAL HIGH (ref 70–99)

## 2018-03-06 LAB — BASIC METABOLIC PANEL
ANION GAP: 7 (ref 5–15)
BUN: 30 mg/dL — AB (ref 8–23)
CHLORIDE: 106 mmol/L (ref 98–111)
CO2: 32 mmol/L (ref 22–32)
Calcium: 7.9 mg/dL — ABNORMAL LOW (ref 8.9–10.3)
Creatinine, Ser: 1.03 mg/dL — ABNORMAL HIGH (ref 0.44–1.00)
GFR calc Af Amer: 51 mL/min — ABNORMAL LOW (ref >60)
GFR calc non Af Amer: 44 mL/min — ABNORMAL LOW (ref >60)
GLUCOSE: 167 mg/dL — AB (ref 70–99)
POTASSIUM: 3.5 mmol/L (ref 3.5–5.1)
SODIUM: 145 mmol/L (ref 135–145)

## 2018-03-06 LAB — CBC
HEMATOCRIT: 30.9 % — AB (ref 35.0–47.0)
HEMOGLOBIN: 10.5 g/dL — AB (ref 12.0–16.0)
MCH: 31.2 pg (ref 26.0–34.0)
MCHC: 33.8 g/dL (ref 32.0–36.0)
MCV: 92.4 fL (ref 80.0–100.0)
Platelets: 274 10*3/uL (ref 150–440)
RBC: 3.35 MIL/uL — ABNORMAL LOW (ref 3.80–5.20)
RDW: 15 % — ABNORMAL HIGH (ref 11.5–14.5)
WBC: 9.5 10*3/uL (ref 3.6–11.0)

## 2018-03-06 LAB — PREALBUMIN: Prealbumin: 11.2 mg/dL — ABNORMAL LOW (ref 18–38)

## 2018-03-06 LAB — PHOSPHORUS: Phosphorus: 3.4 mg/dL (ref 2.5–4.6)

## 2018-03-06 LAB — TRIGLYCERIDES: Triglycerides: 114 mg/dL (ref <150)

## 2018-03-06 LAB — MAGNESIUM: Magnesium: 2.1 mg/dL (ref 1.7–2.4)

## 2018-03-06 MED ORDER — NICARDIPINE HCL IN NACL 20-0.86 MG/200ML-% IV SOLN
3.0000 mg/h | INTRAVENOUS | Status: DC
Start: 2018-03-06 — End: 2018-03-06
  Administered 2018-03-06: 5 mg/h via INTRAVENOUS
  Administered 2018-03-06: 6 mg/h via INTRAVENOUS
  Administered 2018-03-06 (×2): 10 mg/h via INTRAVENOUS
  Filled 2018-03-06 (×5): qty 200

## 2018-03-06 MED ORDER — NICARDIPINE HCL IN NACL 40-0.83 MG/200ML-% IV SOLN
3.0000 mg/h | INTRAVENOUS | Status: DC
  Administered 2018-03-07: 3 mg/h via INTRAVENOUS
  Filled 2018-03-06 (×3): qty 200

## 2018-03-06 MED ORDER — CLONIDINE HCL 0.2 MG/24HR TD PTWK
0.2000 mg | MEDICATED_PATCH | TRANSDERMAL | Status: DC
  Administered 2018-03-06 – 2018-03-13 (×2): 0.2 mg via TRANSDERMAL
  Filled 2018-03-06 (×2): qty 1

## 2018-03-06 MED ORDER — PHENOL 1.4 % MT LIQD
1.0000 | OROMUCOSAL | Status: DC | PRN
  Administered 2018-03-06 – 2018-03-10 (×2): 1 via OROMUCOSAL
  Filled 2018-03-06 (×2): qty 177

## 2018-03-06 MED ORDER — TRACE MINERALS CR-CU-MN-SE-ZN 10-1000-500-60 MCG/ML IV SOLN
INTRAVENOUS | Status: AC
  Administered 2018-03-06: 18:00:00 via INTRAVENOUS
  Filled 2018-03-06: qty 1200

## 2018-03-06 MED ORDER — FAT EMULSION PLANT BASED 20 % IV EMUL
250.0000 mL | INTRAVENOUS | Status: AC
  Administered 2018-03-06: 250 mL via INTRAVENOUS
  Filled 2018-03-06: qty 250

## 2018-03-06 MED ORDER — FAT EMULSION PLANT BASED 20 % IV EMUL
250.0000 mL | INTRAVENOUS | Status: DC
  Filled 2018-03-06: qty 250

## 2018-03-06 MED ORDER — CLINIMIX E/DEXTROSE (5/15) 5 % IV SOLN
INTRAVENOUS | Status: DC
  Filled 2018-03-06: qty 1200

## 2018-03-06 MED ORDER — FUROSEMIDE 10 MG/ML IJ SOLN
40.0000 mg | Freq: Once | INTRAMUSCULAR | Status: AC
  Administered 2018-03-06: 40 mg via INTRAVENOUS
  Filled 2018-03-06: qty 4

## 2018-03-06 NOTE — Progress Notes (Signed)
MD made aware that BP has been staying in the 180s- low 200s/ 60s-70s. Pt is asymptomatic. All schedule medications have been given and PRN hydralazine was given at 0751 Q4 PRN. New orders to d/c nitro patch order, and place a clonidine 0.2 mg patch instead. Pt and family updated. RN will continue to monitor.

## 2018-03-06 NOTE — Consult Note (Signed)
Reason for Consult: Hypertensive emergency Referring Physician: Dr. Genia Plants Tara [name] is an 84 y.o. female.  HPI: Tara [name] is a very pleasant highly independent 84 year old African-American female with a past medical history remarkable for hypertension, diabetes, asthma, who was admitted for nausea, vomiting and significant abdominal pain, found to have high-grade ileus versus small bowel obstruction. She was also noted to have hypertensive emergency and was admitted initially to the intensive care unit for a nicardipine infusion. She was subsequently transitioned to scheduled IV hydralazine and Lopressor. Was seen by surgery in consultation for SBO and opted for decompression versus exploratory laparotomy. Patient had worsening hypertension today and unable to control with IV medications and will be subsequently transferred back to the intensive care unit for nicardipine infusion. Presently she is doing okay awake alert and communicating  Past Medical History:  Diagnosis Date  . Asthma   . Diabetes mellitus ([place])   . Environmental allergies   . Hypertension     Past Surgical History:  Procedure Laterality Date  . APPENDECTOMY    . TUBAL LIGATION      Family History  Problem Relation Age of Onset  . Hypertension Mother   . Cancer Brother        lung (question)  . Cancer Sister        questionable type    Social History:  reports that she has never smoked. She has never used smokeless tobacco. She reports that she does not drink alcohol or use drugs.  Allergies:  Allergies  Allergen Reactions  . Ace Inhibitors Other (See Comments)    Angioedema  . Hydrochlorothiazide W-Triamterene Other (See Comments)    Gout flare  . Lisinopril Swelling and Other (See Comments)  . Penicillins Swelling  . Trichlormethiazide Other (See Comments)    Gout flare    Medications: I have reviewed the patient's current medications.  Results for orders placed or performed during the  hospital encounter of 03/01/18 (from the past 48 hour(s))  Glucose, capillary     Status: Abnormal   Collection Time: 03/04/18  4:17 PM  Result Value Ref Range   Glucose-Capillary 133 (H) 70 - 99 mg/dL   Comment 1 Notify RN   Phosphorus     Status: None   Collection Time: 03/04/18  8:54 PM  Result Value Ref Range   Phosphorus 4.1 2.5 - 4.6 mg/dL    Comment: Performed at [facility], [address] [place], [place] [address]  Glucose, capillary     Status: Abnormal   Collection Time: 03/05/18 12:05 AM  Result Value Ref Range   Glucose-Capillary 140 (H) 70 - 99 mg/dL  Basic metabolic panel     Status: Abnormal   Collection Time: 03/05/18  5:49 AM  Result Value Ref Range   Sodium 143 135 - 145 mmol/L   Potassium 3.6 3.5 - 5.1 mmol/L   Chloride 106 98 - 111 mmol/L   CO2 30 22 - 32 mmol/L   Glucose, Bld 114 (H) 70 - 99 mg/dL   BUN 34 (H) 8 - 23 mg/dL   Creatinine, Ser 1.14 (H) 0.44 - 1.00 mg/dL   Calcium 7.9 (L) 8.9 - 10.3 mg/dL   GFR calc non Af Amer 39 (L) >60 mL/min   GFR calc Af Amer 45 (L) >60 mL/min    Comment: (NOTE) The eGFR has been calculated using the CKD EPI equation. This calculation has not been validated in all clinical situations. eGFR's persistently <60 mL/min signify possible  Chronic Kidney Disease.    Anion gap 7 5 - 15    Comment: Performed at [facility], [address] [place], [place] [address]  CBC     Status: Abnormal   Collection Time: 03/05/18  5:49 AM  Result Value Ref Range   WBC 9.9 3.6 - 11.0 K/uL   RBC 3.19 (L) 3.80 - 5.20 MIL/uL   Hemoglobin 10.1 (L) 12.0 - 16.0 g/dL   HCT 29.6 (L) 35.0 - 47.0 %   MCV 92.8 80.0 - 100.0 fL   MCH 31.6 26.0 - 34.0 pg   MCHC 34.1 32.0 - 36.0 g/dL   RDW 15.4 (H) 11.5 - 14.5 %   Platelets 232 150 - 440 K/uL    Comment: Performed at [facility], [address] [place], [place] [address]  Magnesium     Status: None   Collection Time: 03/05/18  5:49 AM  Result Value Ref Range    Magnesium 2.3 1.7 - 2.4 mg/dL    Comment: Performed at [facility], [address] [place], [place] [address]  Glucose, capillary     Status: Abnormal   Collection Time: 03/05/18  5:59 AM  Result Value Ref Range   Glucose-Capillary 122 (H) 70 - 99 mg/dL  Glucose, capillary     Status: None   Collection Time: 03/05/18  8:10 AM  Result Value Ref Range   Glucose-Capillary 95 70 - 99 mg/dL   Comment 1 Notify RN   Glucose, capillary     Status: Abnormal   Collection Time: 03/05/18 12:02 PM  Result Value Ref Range   Glucose-Capillary 104 (H) 70 - 99 mg/dL  Hepatic function panel     Status: Abnormal   Collection Time: 03/05/18  1:56 PM  Result Value Ref Range   Total Protein 5.4 (L) 6.5 - 8.1 g/dL   Albumin 2.0 (L) 3.5 - 5.0 g/dL   AST 21 15 - 41 U/L   ALT 12 0 - 44 U/L   Alkaline Phosphatase 47 38 - 126 U/L   Total Bilirubin 0.7 0.3 - 1.2 mg/dL   Bilirubin, Direct <0.1 0.0 - 0.2 mg/dL   Indirect Bilirubin NOT CALCULATED 0.3 - 0.9 mg/dL    Comment: Performed at [facility], [address], [place],  [address]  Phosphorus     Status: None   Collection Time: 03/05/18  1:56 PM  Result Value Ref Range   Phosphorus 4.2 2.5 - 4.6 mg/dL    Comment: Performed at [facility], [address] [place],  [address]  Glucose, capillary     Status: None   Collection Time: 03/05/18  4:55 PM  Result Value Ref Range   Glucose-Capillary 95 70 - 99 mg/dL   Comment 1 Notify RN   Glucose, capillary     Status: Abnormal   Collection Time: 03/06/18 12:22 AM  Result Value Ref Range   Glucose-Capillary 163 (H) 70 - 99 mg/dL  Glucose, capillary     Status: Abnormal   Collection Time: 03/06/18  5:26 AM  Result Value Ref Range   Glucose-Capillary 155 (H) 70 - 99 mg/dL  CBC     Status: Abnormal   Collection Time: 03/06/18  6:21 AM  Result Value Ref Range   WBC 9.5 3.6 - 11.0 K/uL   RBC 3.35 (L) 3.80 - 5.20 MIL/uL   Hemoglobin 10.5 (L) 12.0 - 16.0 g/dL    HCT 30.9 (L) 35.0 - 47.0 %   MCV 92.4  80.0 - 100.0 fL   MCH 31.2 26.0 - 34.0 pg   MCHC 33.8 32.0 - 36.0 g/dL   RDW 15.0 (H) 11.5 - 14.5 %   Platelets 274 150 - 440 K/uL    Comment: Performed at [facility], [address] [place], [place] [address]  Triglycerides     Status: None   Collection Time: 03/06/18  6:21 AM  Result Value Ref Range   Triglycerides 114 <150 mg/dL    Comment: Performed at [facility], [address] [place], [place] [address]  Differential     Status: None   Collection Time: 03/06/18  6:21 AM  Result Value Ref Range   Neutrophils Relative % 68 %   Neutro Abs 6.5 1.4 - 6.5 K/uL   Lymphocytes Relative 19 %   Lymphs Abs 1.8 1.0 - 3.6 K/uL   Monocytes Relative 8 %   Monocytes Absolute 0.8 0.2 - 0.9 K/uL   Eosinophils Relative 4 %   Eosinophils Absolute 0.4 0 - 0.7 K/uL   Basophils Relative 1 %   Basophils Absolute 0.1 0 - 0.1 K/uL    Comment: Performed at [facility], [address] [place], [place] [address]  Basic metabolic panel     Status: Abnormal   Collection Time: 03/06/18  6:21 AM  Result Value Ref Range   Sodium 145 135 - 145 mmol/L   Potassium 3.5 3.5 - 5.1 mmol/L   Chloride 106 98 - 111 mmol/L   CO2 32 22 - 32 mmol/L   Glucose, Bld 167 (H) 70 - 99 mg/dL   BUN 30 (H) 8 - 23 mg/dL   Creatinine, Ser 1.03 (H) 0.44 - 1.00 mg/dL   Calcium 7.9 (L) 8.9 - 10.3 mg/dL   GFR calc non Af Amer 44 (L) >60 mL/min   GFR calc Af Amer 51 (L) >60 mL/min    Comment: (NOTE) The eGFR has been calculated using the CKD EPI equation. This calculation has not been validated in all clinical situations. eGFR's persistently <60 mL/min signify possible Chronic Kidney Disease.    Anion gap 7 5 - 15    Comment: Performed at [facility], [address] [place], [place] [address]  Phosphorus     Status: None   Collection Time: 03/06/18  6:21 AM  Result Value Ref Range   Phosphorus 3.4 2.5 - 4.6 mg/dL    Comment: Performed  at [facility], [address] [place], [place] [address]  Magnesium     Status: None   Collection Time: 03/06/18  6:21 AM  Result Value Ref Range   Magnesium 2.1 1.7 - 2.4 mg/dL    Comment: Performed at [facility], [address] [place], [place] [address]  Prealbumin     Status: Abnormal   Collection Time: 03/06/18  6:32 AM  Result Value Ref Range   Prealbumin 11.2 (L) 18 - 38 mg/dL    Comment: Performed at [facility] [address] [place], [place] [address]  Glucose, capillary     Status: Abnormal   Collection Time: 03/06/18 11:36 AM  Result Value Ref Range   Glucose-Capillary 238 (H) 70 - 99 mg/dL    Dg Chest 1 View  Result Date: 03/06/2018 CLINICAL DATA:  Congestive heart failure. EXAM: CHEST  1 VIEW COMPARISON:  One-view chest x-ray 03/05/2018 FINDINGS: Heart is enlarged. NG tube terminates in the stomach. A right-sided PICC line is stable. Interstitial edema and bilateral pleural effusions  is stable. Bibasilar airspace disease likely reflects atelectasis. IMPRESSION: 1. Stable appearance of cardiomegaly with interstitial edema and bilateral effusions compatible with congestive heart failure. Electronically Signed   By: San Morelle M.D.   On: 03/06/2018 07:18   Dg Chest 1 View  Result Date: 03/05/2018 CLINICAL DATA:  Fever EXAM: CHEST  1 VIEW COMPARISON:  03/03/2018 FINDINGS: Cardiac enlargement with vascular congestion. Progressive vascular congestion and bilateral edema. Bibasilar airspace disease and small effusions left greater than right with mild progression from the prior study. NG tube in the stomach IMPRESSION: Progression of vascular congestion and edema. Progressive atelectasis and effusion left greater than right. Electronically Signed   By: Franchot Gallo M.D.   On: 03/05/2018 09:20   Dg Abd 1 View  Result Date: 03/05/2018 CLINICAL DATA:  Ileus EXAM: ABDOMEN - 1 VIEW COMPARISON:  03/04/2018 FINDINGS: Enteric tube is looped in  the left upper quadrant consistent with location in the body of the stomach. Decreasing gaseous distention of small bowel since previous study. Left upper quadrant small bowel remains distended. Paucity of gas in the colon with stool in the right colon. No radiopaque stones. Degenerative changes in the lumbar spine with lumbar scoliosis convex towards the right. Vascular calcifications. Old fractures of the left superior and right inferior pubic rami. IMPRESSION: Decreasing gaseous distention of small bowel since previous study. Enteric tube is unchanged in position. Electronically Signed   By: Lucienne Capers M.D.   On: 03/05/2018 04:58   Dg Abd 1 View  Result Date: 03/05/2018 CLINICAL DATA:  NG tube placement EXAM: ABDOMEN - 1 VIEW COMPARISON:  03/04/2018, 03/03/2018 FINDINGS: Pleural effusions and bibasilar airspace disease. Esophageal tube is looped back upon itself with the tip positioned near the GE junction. Gaseous enlargement of small bowel up to 3 cm. Vascular calcifications. IMPRESSION: 1. Esophageal tube is looped upon itself in the stomach with the tip projecting near the GE junction and directed cephalad. 2. Dilated loops of small bowel within the central abdomen and pelvis 3. Pleural effusions and basilar consolidations. Electronically Signed   By: Donavan Foil M.D.   On: 03/05/2018 00:46   Dg Abd 2 Views  Result Date: 03/06/2018 CLINICAL DATA:  Abdominal pain. EXAM: ABDOMEN - 2 VIEW COMPARISON:  03/05/2018 radiographs and prior studies FINDINGS: An NG tube coiled within the stomach again noted. Decreased mild gaseous distension of a few small bowel loops noted. No new findings are identified. IMPRESSION: Decreased mild gaseous distension of a few small bowel loops. NG tube again noted. Electronically Signed   By: Margarette Canada M.D.   On: 03/06/2018 14:51   Dg Abd Portable 1v  Result Date: 03/04/2018 CLINICAL DATA:  Small bowel obstruction. EXAM: PORTABLE ABDOMEN - 1 VIEW COMPARISON:   Radiograph of March 03, 2018. FINDINGS: Nasogastric tube is seen looped within the stomach with distal tip in proximal stomach. Improved mild small bowel dilatation is noted concerning for distal small bowel obstruction. Vascular calcifications are noted. IMPRESSION: Nasogastric tube tip seen within proximal stomach. Decreased small bowel dilatation is noted suggesting improving distal small bowel obstruction or ileus. Electronically Signed   By: Marijo Conception, M.D.   On: 03/04/2018 20:53   [place] Ekg Site Rite  Result Date: 03/05/2018 If Site Rite image not attached, placement could not be confirmed due to current cardiac rhythm.   ROS Blood pressure (!) 204/72, pulse 84, temperature 99.7 F (37.6 C), temperature source Oral, resp. rate (!) 24, height 4' 11"  (1.499 m), weight 99  lb 3.3 oz (45 kg), SpO2 95 %. Physical Exam General: NAD Neuro: No focal deficits HEENT: NCAT, sclerae white, oropharynx normal Cardiovascular: Regular, no M Lungs: Clear to auscultation and percussion Abdomen: Moderately distended, moderately firm, mildly tender, + BS in all quadrants Extremities: Warm, no edema  Assessment/Plan:  Hypertensive emergency. Will add Cardene infusion and reduce blood pressure to approximately 160  High-grade ileus versus small bowel obstruction. NG tube decompression, being followed by surgery  Protein calorie malnutrition. Started on TPN  Prerenal azotemia. Has slowly improved  Anemia. No evidence of active bleeding  Kamarian Sahakian 03/06/2018, 3:47 PM

## 2018-03-06 NOTE — Progress Notes (Signed)
Patient ID: Tara [name], female   DOB: 08/15/1917, 84 y.o.   MRN: [id number]     [facility] Day(s): 4.   Post op day(s):  Mar[place] Kitchen   Interval History: Patient seen and examined, no acute events or new complaints overnight. Patient reports feeling "OK" with mild pain. Refers still not passing gas. .  Vital signs in last 24 hours: [min-max] current  Temp:  [99 F (37.2 C)-100.1 F (37.8 C)] 99.2 F (37.3 C) (07/27 0631) Pulse Rate:  [71-93] 71 (07/27 0838) Resp:  [16-32] 24 (07/27 0631) BP: (159-213)/(56-84) 188/79 (07/27 0838) SpO2:  [90 %-100 %] 93 % (07/27 0631) Weight:  [44.7 kg (98 lb 9.6 oz)-46.1 kg (101 lb 11.2 oz)] 45 kg (99 lb 3.3 oz) (07/27 0525)     Height: 4\' 11"  (149.9 cm) Weight: 45 kg (99 lb 3.3 oz) BMI (Calculated): 20.03   Intake/Output this shift:  NGT 350 mL (bilious)  Physical Exam:  Constitutional: alert, cooperative and no distress  Gastrointestinal: soft, mild-tender, and mild-distended  Labs:  CBC Latest Ref Rng & Units 03/06/2018 03/05/2018 03/01/2018  WBC 3.6 - 11.0 K/uL 9.5 9.9 7.0  Hemoglobin 12.0 - 16.0 g/dL 10.5(L) 10.1(L) 12.1  Hematocrit 35.0 - 47.0 % 30.9(L) 29.6(L) 36.2  Platelets 150 - 440 K/uL 274 232 257   CMP Latest Ref Rng & Units 03/06/2018 03/05/2018 03/04/2018  Glucose 70 - 99 mg/dL 167(H) 114(H) 149(H)  BUN 8 - 23 mg/dL 30(H) 34(H) 33(H)  Creatinine 0.44 - 1.00 mg/dL 1.03(H) 1.14(H) 1.25(H)  Sodium 135 - 145 mmol/L 145 143 144  Potassium 3.5 - 5.1 mmol/L 3.5 3.6 3.9  Chloride 98 - 111 mmol/L 106 106 111  CO2 22 - 32 mmol/L 32 30 27  Calcium 8.9 - 10.3 mg/dL 7.9(L) 7.9(L) 7.9(L)  Total Protein 6.5 - 8.1 g/dL - 5.4(L) -  Total Bilirubin 0.3 - 1.2 mg/dL - 0.7 -  Alkaline Phos 38 - 126 U/L - 47 -  AST 15 - 41 U/L - 21 -  ALT 0 - 44 U/L - 12 -    Assessment/Plan:  84 y.o. female with small bowel obstruction.  There is adequate labs, and vitals signs. NGT output is adequate but still not passing gas. Will keep NGT until  start passing gas.   Arnold Long, MD

## 2018-03-06 NOTE — Progress Notes (Signed)
MD notified that pt's BP has not decreased below 200/70s. All prescribed IV antihypertensives have been given and PRN hydralazine has been given at 1357. Pt states "she does not feel well." PRN morphine was given at 1410, pt states "that her pain in her stomach is coming and going but still does not feel well." New orders placed by MD. Pt and pt.'s family updated.   Tara [name] [employer]

## 2018-03-06 NOTE — Progress Notes (Signed)
Pt transferred to bed ICU 3, pt alert and oriented, VSS.all pt belongings were transported without complications. pt's family updated. Report given to taylor, RN  Alanson Puls

## 2018-03-06 NOTE — Progress Notes (Signed)
PHARMACY - ADULT TOTAL PARENTERAL NUTRITION CONSULT NOTE   Pharmacy Consult for TPN Indication: bowel obstruction  Patient Measurements: Height: 4\' 11"  (149.9 cm) Weight: 99 lb 3.3 oz (45 kg) IBW/kg (Calculated) : 43.2 TPN AdjBW (KG): 46.3 Body mass index is 20.04 kg/m.   Assessment: 84 yo female with SBO starting on TPN  Endo: Pt with hx of DM Insulin requirements in the past 24 hours:  Lytes: K 3.6, Mag 2.3, Phos 4.2 - no supplementation at this time Renal: SCr 1.14, CrCl 18.8 ml/min   TPN Access: Order to place PICC 7/26 TPN start date: To be started 7/26 Nutritional Goals (per RD recommendation on 7/26):  Estimated Nutritional Needs:  Kcal:  1100-1300kcal/day  Protein:  60-70g/day Fluid:  >1.1L/day  Goal TPN rate is 50 ml/hr (provides 1212kcal/day, 60g/day protein, 1362ml volume)  Current Nutrition: NPO  Plan:  Clinimix E 5/15 at 41 ml/hr with MVI and trace elements in TPN 20% lipid emulsion at 15 ml/hr x 12h/day  SSI 0-9 units ordered q6h and adjust as needed  Will increase Clinimix rate to 50 ml/hr with MVI and trace along with 20% lipid @ 15 ml/hr x 12 hours.   Monitor TPN labs, BG F/U in AM  Kasi Lasky D 03/06/2018,9:53 AM

## 2018-03-06 NOTE — Progress Notes (Signed)
[facility] [place] NAME: Tara [name]    MR#:  [id number]  DATE OF BIRTH:  1918/08/04  SUBJECTIVE:  Patient seen and evaluated today Has mild abdominal discomfort On NG tube with suction Has elevated blood pressure  no new episodes of fever   REVIEW OF SYSTEMS:    Review of Systems  Constitutional: No fever HENT: Negative for ear pain, nosebleeds, congestion, facial swelling, rhinorrhea, neck pain, neck stiffness and ear discharge.   Respiratory: Negative for cough, shortness of breath, wheezing  Cardiovascular: Negative for chest pain, palpitations and leg swelling.  Gastrointestinal: Negative for heartburn,mild  abdominal pain present ,no vomiting, diarrhea or consitpation Genitourinary: Negative for dysuria, urgency, frequency, hematuria Musculoskeletal: Negative for back pain or joint pain Neurological: Negative for dizziness, seizures, syncope, focal weakness,  numbness and headaches.  Hematological: Does not bruise/bleed easily.  Psychiatric/Behavioral: Negative for hallucinations, confusion, dysphoric mood    Tolerating Diet: npo     DRUG ALLERGIES:   Allergies  Allergen Reactions  . Ace Inhibitors Other (See Comments)    Angioedema  . Hydrochlorothiazide W-Triamterene Other (See Comments)    Gout flare  . Lisinopril Swelling and Other (See Comments)  . Penicillins Swelling  . Trichlormethiazide Other (See Comments)    Gout flare    VITALS:  Blood pressure (!) 196/63, pulse 79, temperature 97.7 F (36.5 C), temperature source Axillary, resp. rate (!) 24, height 4\' 11"  (1.499 m), weight 45 kg (99 lb 3.3 oz), SpO2 93 %.  PHYSICAL EXAMINATION:  Constitutional: Appears thin and frail and has NG tube placed no distress. HENT: Normocephalic. Marland Kitchen Oropharynx is clear and moist.  Eyes: Conjunctivae and EOM are normal. PERRLA, no scleral icterus.  Neck: Normal ROM. Neck supple. No JVD. No tracheal deviation. CVS: RRR,  S1/S2 +, no murmurs, no gallops, no carotid bruit.  Pulmonary: Effort and breath sounds normal, no stridor, rhonchi, wheezes, rales.  Abdominal: no BS  ++distension, mild tenderness, NO rebound or guarding.  Musculoskeletal: Normal range of motion. No edema and no tenderness.  Neuro: Alert. CN 2-12 grossly intact. No focal deficits. Skin: Skin is warm and dry. No rash noted. Psychiatric: Normal mood and affect.      LABORATORY PANEL:   CBC Recent Labs  Lab 03/06/18 0621  WBC 9.5  HGB 10.5*  HCT 30.9*  PLT 274   ------------------------------------------------------------------------------------------------------------------  Chemistries  Recent Labs  Lab 03/05/18 1356 03/06/18 0621  NA  --  145  K  --  3.5  CL  --  106  CO2  --  32  GLUCOSE  --  167*  BUN  --  30*  CREATININE  --  1.03*  CALCIUM  --  7.9*  MG  --  2.1  AST 21  --   ALT 12  --   ALKPHOS 47  --   BILITOT 0.7  --    ------------------------------------------------------------------------------------------------------------------  Cardiac Enzymes Recent Labs  Lab 03/01/18 2250 03/02/18 0402  TROPONINI 0.04* 0.04*   ------------------------------------------------------------------------------------------------------------------  RADIOLOGY:  Dg Chest 1 View  Result Date: 03/06/2018 CLINICAL DATA:  Congestive heart failure. EXAM: CHEST  1 VIEW COMPARISON:  One-view chest x-ray 03/05/2018 FINDINGS: Heart is enlarged. NG tube terminates in the stomach. A right-sided PICC line is stable. Interstitial edema and bilateral pleural effusions is stable. Bibasilar airspace disease likely reflects atelectasis. IMPRESSION: 1. Stable appearance of cardiomegaly with interstitial edema and bilateral effusions compatible with congestive heart failure. Electronically Signed   By:  San Morelle M.D.   On: 03/06/2018 07:18   Dg Chest 1 View  Result Date: 03/05/2018 CLINICAL DATA:  Fever EXAM: CHEST  1 VIEW  COMPARISON:  03/03/2018 FINDINGS: Cardiac enlargement with vascular congestion. Progressive vascular congestion and bilateral edema. Bibasilar airspace disease and small effusions left greater than right with mild progression from the prior study. NG tube in the stomach IMPRESSION: Progression of vascular congestion and edema. Progressive atelectasis and effusion left greater than right. Electronically Signed   By: Franchot Gallo M.D.   On: 03/05/2018 09:20   Dg Abd 1 View  Result Date: 03/05/2018 CLINICAL DATA:  Ileus EXAM: ABDOMEN - 1 VIEW COMPARISON:  03/04/2018 FINDINGS: Enteric tube is looped in the left upper quadrant consistent with location in the body of the stomach. Decreasing gaseous distention of small bowel since previous study. Left upper quadrant small bowel remains distended. Paucity of gas in the colon with stool in the right colon. No radiopaque stones. Degenerative changes in the lumbar spine with lumbar scoliosis convex towards the right. Vascular calcifications. Old fractures of the left superior and right inferior pubic rami. IMPRESSION: Decreasing gaseous distention of small bowel since previous study. Enteric tube is unchanged in position. Electronically Signed   By: Lucienne Capers M.D.   On: 03/05/2018 04:58   Dg Abd 1 View  Result Date: 03/05/2018 CLINICAL DATA:  NG tube placement EXAM: ABDOMEN - 1 VIEW COMPARISON:  03/04/2018, 03/03/2018 FINDINGS: Pleural effusions and bibasilar airspace disease. Esophageal tube is looped back upon itself with the tip positioned near the GE junction. Gaseous enlargement of small bowel up to 3 cm. Vascular calcifications. IMPRESSION: 1. Esophageal tube is looped upon itself in the stomach with the tip projecting near the GE junction and directed cephalad. 2. Dilated loops of small bowel within the central abdomen and pelvis 3. Pleural effusions and basilar consolidations. Electronically Signed   By: Donavan Foil M.D.   On: 03/05/2018 00:46   Dg  Abd Portable 1v  Result Date: 03/04/2018 CLINICAL DATA:  Small bowel obstruction. EXAM: PORTABLE ABDOMEN - 1 VIEW COMPARISON:  Radiograph of March 03, 2018. FINDINGS: Nasogastric tube is seen looped within the stomach with distal tip in proximal stomach. Improved mild small bowel dilatation is noted concerning for distal small bowel obstruction. Vascular calcifications are noted. IMPRESSION: Nasogastric tube tip seen within proximal stomach. Decreased small bowel dilatation is noted suggesting improving distal small bowel obstruction or ileus. Electronically Signed   By: Marijo Conception, M.D.   On: 03/04/2018 20:53   [place] Ekg Site Rite  Result Date: 03/05/2018 If Site Rite image not attached, placement could not be confirmed due to current cardiac rhythm.    ASSESSMENT AND PLAN:   84 year old female with history of diabetes who is admitted due to abdominal pain   1.  SBO: Repeat abdominal x ray Continue with NG tube and management as per surgery  2.  Accelerated essential hypertension: Patient off of nicardipine drip to oral medications. Continue IV metoprolol and hydralazine ,  DC Nitroglycerin Start clonidine patch  3.  Hypothyroid: Continue Synthroid  4.  Diabetes: Continue sliding scale  5.  Acute kidney injury in the setting of SBO: On IV fluids  6.  Acute hypoxic respiratory failure in the setting of atelectasis/pulmonary edema: IV lasix 40 mg once for diuresis  Physical therapy is recommending skilled nursing facility upon discharge.  Clinical social worker consultation placed.  Management plans discussed with the patient and daughter and she is  in agreement.  CODE STATUS: full  TOTAL TIME TAKING CARE OF THIS PATIENT: 31 minutes.     POSSIBLE D/C 2-5 days DEPENDING ON CLINICAL CONDITION.   Saundra Shelling M.D on 03/06/2018 at 12:18 PM  Between 7am to 6pm - Pager - [phone] After 6pm go to [email or web address] - password [email or web address] [facility]  Office   [phone]  CC: Primary care physician; Einar Pheasant, MD  Note: This dictation was prepared with Dragon dictation along with smaller phrase technology. Any transcriptional errors that result from this process are unintentional.

## 2018-03-07 ENCOUNTER — Inpatient Hospital Stay: Payer: Medicare Other

## 2018-03-07 LAB — BASIC METABOLIC PANEL
ANION GAP: 7 (ref 5–15)
ANION GAP: 7 (ref 5–15)
BUN: 36 mg/dL — ABNORMAL HIGH (ref 8–23)
BUN: 36 mg/dL — AB (ref 8–23)
CALCIUM: 8 mg/dL — AB (ref 8.9–10.3)
CHLORIDE: 106 mmol/L (ref 98–111)
CO2: 28 mmol/L (ref 22–32)
CO2: 29 mmol/L (ref 22–32)
Calcium: 8.1 mg/dL — ABNORMAL LOW (ref 8.9–10.3)
Chloride: 106 mmol/L (ref 98–111)
Creatinine, Ser: 1.18 mg/dL — ABNORMAL HIGH (ref 0.44–1.00)
Creatinine, Ser: 0.98 mg/dL (ref 0.44–1.00)
GFR calc Af Amer: 43 mL/min — ABNORMAL LOW (ref >60)
GFR calc Af Amer: 54 mL/min — ABNORMAL LOW (ref >60)
GFR calc non Af Amer: 37 mL/min — ABNORMAL LOW (ref >60)
GFR calc non Af Amer: 46 mL/min — ABNORMAL LOW (ref >60)
GLUCOSE: 125 mg/dL — AB (ref 70–99)
GLUCOSE: 189 mg/dL — AB (ref 70–99)
POTASSIUM: 3 mmol/L — AB (ref 3.5–5.1)
POTASSIUM: 4.3 mmol/L (ref 3.5–5.1)
Sodium: 141 mmol/L (ref 135–145)
Sodium: 142 mmol/L (ref 135–145)

## 2018-03-07 LAB — GLUCOSE, CAPILLARY
GLUCOSE-CAPILLARY: 221 mg/dL — AB (ref 70–99)
Glucose-Capillary: 182 mg/dL — ABNORMAL HIGH (ref 70–99)
Glucose-Capillary: 194 mg/dL — ABNORMAL HIGH (ref 70–99)

## 2018-03-07 LAB — PHOSPHORUS: Phosphorus: 3 mg/dL (ref 2.5–4.6)

## 2018-03-07 LAB — MAGNESIUM: MAGNESIUM: 1.9 mg/dL (ref 1.7–2.4)

## 2018-03-07 MED ORDER — POTASSIUM CHLORIDE 10 MEQ/50ML IV SOLN
10.0000 meq | INTRAVENOUS | Status: DC
  Filled 2018-03-07 (×3): qty 50

## 2018-03-07 MED ORDER — FAT EMULSION PLANT BASED 20 % IV EMUL
250.0000 mL | INTRAVENOUS | Status: AC
  Administered 2018-03-07: 250 mL via INTRAVENOUS
  Filled 2018-03-07: qty 250

## 2018-03-07 MED ORDER — HYDRALAZINE HCL 20 MG/ML IJ SOLN
20.0000 mg | Freq: Four times a day (QID) | INTRAMUSCULAR | Status: DC
  Administered 2018-03-07 – 2018-03-12 (×19): 20 mg via INTRAVENOUS
  Filled 2018-03-07 (×18): qty 1

## 2018-03-07 MED ORDER — TRACE MINERALS CR-CU-MN-SE-ZN 10-1000-500-60 MCG/ML IV SOLN
INTRAVENOUS | Status: AC
  Administered 2018-03-07: 18:00:00 via INTRAVENOUS
  Filled 2018-03-07: qty 1200

## 2018-03-07 MED ORDER — METOPROLOL TARTRATE 5 MG/5ML IV SOLN
5.0000 mg | Freq: Four times a day (QID) | INTRAVENOUS | Status: DC
  Administered 2018-03-07 – 2018-03-12 (×19): 5 mg via INTRAVENOUS
  Filled 2018-03-07 (×18): qty 5

## 2018-03-07 MED ORDER — POTASSIUM CHLORIDE 10 MEQ/100ML IV SOLN
10.0000 meq | INTRAVENOUS | Status: AC
  Administered 2018-03-07 (×3): 10 meq via INTRAVENOUS
  Filled 2018-03-07 (×3): qty 100

## 2018-03-07 NOTE — Telephone Encounter (Signed)
Called and spoke with Parks.  Discussed hospitalization and blood pressure issues.  Ms Mcgriff has been transferred back to ICU for blood pressure control.  Questions answered.

## 2018-03-07 NOTE — Progress Notes (Signed)
   03/07/18 1105  Clinical Encounter Type  Visited With Patient and family together  Visit Type Initial;Spiritual support  Referral From Nurse  Consult/Referral To Chaplain  Spiritual Encounters  Spiritual Needs Prayer;Emotional   [place] visited with Tara [name] and her daughter while rounding on the ICU. Patient was pleasant to talk to and very attentive. I noted to patient and her daughter that Tara [name] was "very sharp'" meaning she appeared to have a stable mind and good understanding of questions that I asked. CH will follow up with Tara [name] as needed.

## 2018-03-07 NOTE — Progress Notes (Signed)
Patient ID: Tara [name], female   DOB: November 30, 1917, 84 y.o.   MRN: [id number]     [facility] Day(s): 5.   Post op day(s):  Marland Kitchen   Interval History: Patient seen and examined, no acute events or new complaints overnight. Patient reports feeling a little bit better, denies nausea or vomiting. Refers has a small bowel movement but no gas per rectum.   Vital signs in last 24 hours: [min-max] current  Temp:  [97.7 F (36.5 C)-100 F (37.8 C)] 98.6 F (37 C) (07/28 0800) Pulse Rate:  [79-111] 94 (07/28 0822) Resp:  [16-30] 19 (07/28 0825) BP: (134-234)/(47-95) 142/68 (07/28 0822) SpO2:  [88 %-98 %] 97 % (07/28 0822) Weight:  [43.8 kg (96 lb 9 oz)] 43.8 kg (96 lb 9 oz) (07/28 0800)     Height: 4\' 11"  (149.9 cm) Weight: 43.8 kg (96 lb 9 oz) BMI (Calculated): 19.49   NGT: 200 mL  Physical Exam:  Constitutional: alert, cooperative and no distress  Gastrointestinal: soft, non-tender, and non-distended  Labs:  CBC Latest Ref Rng & Units 03/06/2018 03/05/2018 03/01/2018  WBC 3.6 - 11.0 K/uL 9.5 9.9 7.0  Hemoglobin 12.0 - 16.0 g/dL 10.5(L) 10.1(L) 12.1  Hematocrit 35.0 - 47.0 % 30.9(L) 29.6(L) 36.2  Platelets 150 - 440 K/uL 274 232 257   CMP Latest Ref Rng & Units 03/07/2018 03/06/2018 03/05/2018  Glucose 70 - 99 mg/dL 125(H) 167(H) 114(H)  BUN 8 - 23 mg/dL 36(H) 30(H) 34(H)  Creatinine 0.44 - 1.00 mg/dL 1.18(H) 1.03(H) 1.14(H)  Sodium 135 - 145 mmol/L 141 145 143  Potassium 3.5 - 5.1 mmol/L 3.0(L) 3.5 3.6  Chloride 98 - 111 mmol/L 106 106 106  CO2 22 - 32 mmol/L 28 32 30  Calcium 8.9 - 10.3 mg/dL 8.0(L) 7.9(L) 7.9(L)  Total Protein 6.5 - 8.1 g/dL - - 5.4(L)  Total Bilirubin 0.3 - 1.2 mg/dL - - 0.7  Alkaline Phos 38 - 126 U/L - - 47  AST 15 - 41 U/L - - 21  ALT 0 - 44 U/L - - 12   Imaging studies: New abdominal xray shows still mild dilation of few loop of bowel. No generalized dilation. No free air.   Assessment/Plan:  84 y.o. female with small bowel  obstruction.  Patient transferred yesterday due to elevated blood pressure unable to be controlled. There is adequate labs, and vitals signs. NGT output is adequate but still not passing gas. Will keep NGT until start passing gas. No indication for urgent surgical at this moment. Will follow closely.   Arnold Long, MD

## 2018-03-07 NOTE — Progress Notes (Signed)
Follow up - Critical Care Medicine Note  Patient Details:    Tara [name] is an 84 y.o. female.past medical history remarkable for hypertension, diabetes, asthma, who was admitted for nausea, vomiting and significant abdominal pain, found to have high-grade ileus versus small bowel obstruction. She was also noted to have hypertensive emergency and was admitted initially to the intensive care unit for a nicardipine infusion. She was subsequently transitioned to scheduled IV hydralazine and Lopressor. Was seen by surgery in consultation for SBO and opted for decompression versus exploratory laparotomy. Patient had worsening hypertension and unable to control with IV medications and will be subsequently transferred back to the intensive care unit for nicardipine infusion.     Lines, Airways, Drains: PICC Double Lumen 03/05/18 PICC Right Brachial 34 cm 0 cm (Active)  Indication for Insertion or Continuance of Line Administration of hyperosmolar/irritating solutions (i.e. TPN, Vancomycin, etc.) 03/07/2018  8:00 AM  Exposed Catheter (cm) 0 cm 03/05/2018  6:49 PM  Site Assessment Clean;Dry;Intact 03/07/2018  8:00 AM  Lumen #1 Status Infusing;Flushed;Blood return noted 03/07/2018  8:00 AM  Lumen #2 Status Infusing;Flushed;Blood return noted;Cap changed 03/07/2018  8:00 AM  Dressing Type Transparent;Occlusive 03/07/2018  8:00 AM  Dressing Status Clean;Dry;Antimicrobial disc in place 03/07/2018  8:00 AM  Line Care Connections checked and tightened 03/07/2018  8:00 AM  Dressing Intervention New dressing 03/05/2018  6:49 PM  Dressing Change Due 03/12/18 03/07/2018 12:00 AM     NG/OG Tube Nasogastric 14 Fr. Right nare (Active)  Site Assessment Clean;Intact;Dry 03/07/2018  8:00 AM  Ongoing Placement Verification No change in respiratory status 03/07/2018  8:00 AM  Status Suction-low intermittent 03/07/2018  8:00 AM  Drainage Appearance Brown 03/07/2018 12:25 AM  Intake (mL) 0 mL 03/06/2018  7:43 AM  Output (mL) 50  mL 03/06/2018  4:00 PM     External Urinary Catheter (Active)  Collection Container Dedicated Suction Canister 03/07/2018  8:00 AM  Output (mL) 100 mL 03/06/2018  7:53 PM    Anti-infectives:  Anti-infectives (From admission, onward)   None      Microbiology: Results for orders placed or performed during the hospital encounter of 03/01/18  MRSA PCR Screening     Status: None   Collection Time: 03/02/18  6:25 AM  Result Value Ref Range Status   MRSA by PCR NEGATIVE NEGATIVE Final    Comment:        The GeneXpert MRSA Assay (FDA approved for NASAL specimens only), is one component of a comprehensive MRSA colonization surveillance program. It is not intended to diagnose MRSA infection nor to guide or monitor treatment for MRSA infections. Performed at [facility], [address] [place], [place] [address]     Studies: Dg Chest 1 View  Result Date: 03/06/2018 CLINICAL DATA:  Congestive heart failure. EXAM: CHEST  1 VIEW COMPARISON:  One-view chest x-ray 03/05/2018 FINDINGS: Heart is enlarged. NG tube terminates in the stomach. A right-sided PICC line is stable. Interstitial edema and bilateral pleural effusions is stable. Bibasilar airspace disease likely reflects atelectasis. IMPRESSION: 1. Stable appearance of cardiomegaly with interstitial edema and bilateral effusions compatible with congestive heart failure. Electronically Signed   By: San Morelle M.D.   On: 03/06/2018 07:18   Dg Chest 1 View  Result Date: 03/05/2018 CLINICAL DATA:  Fever EXAM: CHEST  1 VIEW COMPARISON:  03/03/2018 FINDINGS: Cardiac enlargement with vascular congestion. Progressive vascular congestion and bilateral edema. Bibasilar airspace disease and small effusions left greater than right with mild progression from  the prior study. NG tube in the stomach IMPRESSION: Progression of vascular congestion and edema. Progressive atelectasis and effusion left greater than right. Electronically  Signed   By: Franchot Gallo M.D.   On: 03/05/2018 09:20   Dg Abd 1 View  Result Date: 03/07/2018 CLINICAL DATA:  Follow up small bowel obstruction EXAM: ABDOMEN - 1 VIEW COMPARISON:  03/06/2018 FINDINGS: Nasogastric catheter is noted coiled within the stomach. Scattered large and small bowel gas is noted. Some mildly prominent loops of small bowel are noted in the mid abdomen relatively stable from the prior exam. No free air is seen. Degenerative changes of lumbar spine are noted. IMPRESSION: Mild gaseous distension of a few small bowel loops within the mid abdomen. Electronically Signed   By: Inez Catalina M.D.   On: 03/07/2018 07:03   Dg Abd 1 View  Result Date: 03/05/2018 CLINICAL DATA:  Ileus EXAM: ABDOMEN - 1 VIEW COMPARISON:  03/04/2018 FINDINGS: Enteric tube is looped in the left upper quadrant consistent with location in the body of the stomach. Decreasing gaseous distention of small bowel since previous study. Left upper quadrant small bowel remains distended. Paucity of gas in the colon with stool in the right colon. No radiopaque stones. Degenerative changes in the lumbar spine with lumbar scoliosis convex towards the right. Vascular calcifications. Old fractures of the left superior and right inferior pubic rami. IMPRESSION: Decreasing gaseous distention of small bowel since previous study. Enteric tube is unchanged in position. Electronically Signed   By: Lucienne Capers M.D.   On: 03/05/2018 04:58   Dg Abd 1 View  Result Date: 03/05/2018 CLINICAL DATA:  NG tube placement EXAM: ABDOMEN - 1 VIEW COMPARISON:  03/04/2018, 03/03/2018 FINDINGS: Pleural effusions and bibasilar airspace disease. Esophageal tube is looped back upon itself with the tip positioned near the GE junction. Gaseous enlargement of small bowel up to 3 cm. Vascular calcifications. IMPRESSION: 1. Esophageal tube is looped upon itself in the stomach with the tip projecting near the GE junction and directed cephalad. 2. Dilated  loops of small bowel within the central abdomen and pelvis 3. Pleural effusions and basilar consolidations. Electronically Signed   By: Donavan Foil M.D.   On: 03/05/2018 00:46   Dg Abd 1 View  Result Date: 03/03/2018 CLINICAL DATA:  NG tube placement. EXAM: ABDOMEN - 1 VIEW COMPARISON:  CT abdomen pelvis dated March 01, 2018. FINDINGS: NG tube within the stomach. Mildly dilated loops of air-filled small bowel in the central abdomen. No pneumoperitoneum. No acute osseous abnormality. IMPRESSION: 1. NG tube within the stomach. 2. Unchanged small bowel ileus/obstruction. Electronically Signed   By: Titus Dubin M.D.   On: 03/03/2018 15:59   Dg Chest Port 1 View  Result Date: 03/03/2018 CLINICAL DATA:  Fever and hypoxia. EXAM: PORTABLE CHEST 1 VIEW COMPARISON:  Chest x-ray dated October 30, 2017. FINDINGS: The patient is rotated to the right. NG tube within the stomach. Stable cardiomegaly. Normal pulmonary vascularity. Atherosclerotic calcification of the aortic arch. Small bilateral pleural effusions with bibasilar opacities. No pneumothorax. No acute osseous abnormality. IMPRESSION: 1. Small bilateral pleural effusions with bibasilar opacities, favoring atelectasis. Electronically Signed   By: Titus Dubin M.D.   On: 03/03/2018 16:01   Dg Abd 2 Views  Result Date: 03/06/2018 CLINICAL DATA:  Abdominal pain. EXAM: ABDOMEN - 2 VIEW COMPARISON:  03/05/2018 radiographs and prior studies FINDINGS: An NG tube coiled within the stomach again noted. Decreased mild gaseous distension of a few small bowel loops noted.  No new findings are identified. IMPRESSION: Decreased mild gaseous distension of a few small bowel loops. NG tube again noted. Electronically Signed   By: Margarette Canada M.D.   On: 03/06/2018 14:51   Dg Abd Portable 1v  Result Date: 03/04/2018 CLINICAL DATA:  Small bowel obstruction. EXAM: PORTABLE ABDOMEN - 1 VIEW COMPARISON:  Radiograph of March 03, 2018. FINDINGS: Nasogastric tube is seen  looped within the stomach with distal tip in proximal stomach. Improved mild small bowel dilatation is noted concerning for distal small bowel obstruction. Vascular calcifications are noted. IMPRESSION: Nasogastric tube tip seen within proximal stomach. Decreased small bowel dilatation is noted suggesting improving distal small bowel obstruction or ileus. Electronically Signed   By: Marijo Conception, M.D.   On: 03/04/2018 20:53   [place] Ekg Site Rite  Result Date: 03/05/2018 If Site Rite image not attached, placement could not be confirmed due to current cardiac rhythm.  Ct Angio Abd/pel W And/or Wo Contrast  Result Date: 03/02/2018 CLINICAL DATA:  Generalized abdominal pain EXAM: CTA ABDOMEN AND PELVIS wITHOUT AND WITH CONTRAST TECHNIQUE: Multidetector CT imaging of the abdomen and pelvis was performed using the standard protocol during bolus administration of intravenous contrast. Multiplanar reconstructed images and MIPs were obtained and reviewed to evaluate the vascular anatomy. CONTRAST:  72mL ISOVUE-370 IOPAMIDOL (ISOVUE-370) INJECTION 76% COMPARISON:  CT 06/26/2017 FINDINGS: VASCULAR Aorta: Moderate severe aortic atherosclerosis. No aneurysm or dissection. Celiac: Heavily calcified at the origin suspected moderate stenosis at the origin. SMA: Moderate calcification at the origin. Suspected moderate stenosis at the origin of the SMA. Distal vascular patency. Renals: Single right and single left renal arteries with dense calcification at the origins. Mild to moderate stenosis suspected at the origin of right renal artery. Suspected moderate stenosis at the origin of the left renal artery. IMA: Calcified at the origin.  Distal patency. Inflow: Moderate severe aortic and common iliac disease without occlusion or high-grade focal stenosis. Moderate disease of the internal iliac arteries. No significant stenosis of the external iliac vessels. Proximal Outflow: No significant stenosis at the common femoral or  imaged portions of the superficial femoral and profunda arteries. Moderate aortic atherosclerosis. Review of the MIP images confirms the above findings. NON-VASCULAR Lower chest: Lung bases demonstrate no acute consolidation or effusion. There is cardiomegaly. Hepatobiliary: Subcentimeter hypodense liver lesions too small to characterize. Stones in the gallbladder. No biliary enlargement Pancreas: Unremarkable. No pancreatic ductal dilatation or surrounding inflammatory changes. Spleen: Normal in size without focal abnormality. Adrenals/Urinary Tract: Adrenal glands are within normal limits. No hydronephrosis. Subcentimeter cortical hypodense renal lesions too small to further characterize. Bladder unremarkable Stomach/Bowel: Stomach is nonenlarged. Fluid-filled borderline enlarged loops of small bowel, mostly within the pelvis. No significant bowel wall thickening. Poorly identified appendix. Possible fluid-filled bowel in the right groin, no evidence for incarceration. Lymphatic: No significantly enlarged lymph nodes. Reproductive: No adnexal mass. Other: No free air.  Small amount of free fluid in the pelvis. Musculoskeletal: Scoliosis and degenerative changes of the spine. No acute or suspicious abnormality. Diffuse anasarca. IMPRESSION: VASCULAR Negative for aortic dissection or acute aortic occlusive disease. Moderate severe aortic atherosclerosis with stenosis of branch vessels in the abdomen and pelvis as detailed above. NON-VASCULAR 1. Borderline fluid-filled enlarged loops distal small bowel primarily within the lower abdomen and pelvis without well-defined transition point. Findings could be secondary to ileus or low-grade obstruction. No free air. 2. Gallstones 3. Small amount of free fluid in the pelvis 4. Cardiomegaly Electronically Signed   By: Maudie Mercury  Francoise Ceo M.D.   On: 03/02/2018 00:59   US Abdomen Limited Ruq  Result Date: 03/02/2018 CLINICAL DATA:  Generalized abdominal pain with nausea EXAM:  ULTRASOUND ABDOMEN LIMITED RIGHT UPPER QUADRANT COMPARISON:  CT 03/01/2018 FINDINGS: Gallbladder: Small amount of echogenic sludge. Negative sonographic Murphy. Small stones in the gallbladder. Normal wall thickness. Common bile duct: Diameter: 2.9 mm Liver: Hepatic echogenicity within normal limits. Small cysts within the liver measuring up to 1.1 cm. Portal vein is patent on color Doppler imaging with normal direction of blood flow towards the liver. Tiny amount of fluid adjacent to the liver. Tiny amount of right perinephric fluid. IMPRESSION: 1. Small stones and sludge in the gallbladder. No evidence for acute cholecystitis or biliary dilatation. 2. Trace amount of fluid adjacent to the liver and right kidney 3. Small cysts within the liver Electronically Signed   By: Donavan Foil M.D.   On: 03/02/2018 03:32    Consults: Treatment Team:  Arta Silence, MD Benjamine Sprague, DO Saundra Shelling, MD   Subjective:    Overnight Issues: overnight has remained on the nicardipine infusion, blood pressure is significantly improved presently 150/80  Objective:  Vital signs for last 24 hours: Temp:  [97.7 F (36.5 C)-100 F (37.8 C)] 98.6 F (37 C) (07/28 0800) Pulse Rate:  [79-111] 94 (07/28 0822) Resp:  [16-30] 19 (07/28 0825) BP: (134-234)/(47-95) 142/68 (07/28 0822) SpO2:  [88 %-98 %] 97 % (07/28 0822) Weight:  [96 lb 9 oz (43.8 kg)] 96 lb 9 oz (43.8 kg) (07/28 0800)  Hemodynamic parameters for last 24 hours:    Intake/Output from previous day: 07/27 0701 - 07/28 0700 In: 4426 [I.V.:4426] Out: 970 [Urine:750; Emesis/NG output:220]  Intake/Output this shift: Total I/O In: 162.9 [I.V.:162.9] Out: -   Vent settings for last 24 hours:    Physical Exam:  General:NAD Neuro:No focal deficits HEENT:NCAT, sclerae white, oropharynx normal Cardiovascular:Regular, no M Lungs:Clear to auscultation and percussion Abdomen:Moderately distended, moderately firm, mildly tender, + BS  in all quadrants Extremities: Warm, no edema  Assessment/Plan:   Hypertensive emergency. On nicardipine infusion, will adjust IV medications as patient is unable to take oral secondary to her severe ileus  High-grade ileus versus small bowel obstruction. NG tube decompression, being followed by surgery  Protein calorie malnutrition. Started on TPN  Prerenal azotemia. Has slowly improved  Anemia. No evidence of active bleeding    Critical Care Total Time 35 minutes  Tara [name] 03/07/2018  *Care during the described time interval was provided by me and/or other providers on the critical care team.  I have reviewed this patient's available data, including medical history, events of note, physical examination and test results as part of my evaluation.

## 2018-03-07 NOTE — Progress Notes (Signed)
[place] [facility] [place] NAME: Tara [name]    MR#:  [id number]  DATE OF BIRTH:  June 29, 1918  SUBJECTIVE:  Patient seen and evaluated today Has mild abdominal discomfort On NG tube with suction Has better control blood pressure blood pressure  no new episodes of fever   REVIEW OF SYSTEMS:    Review of Systems  Constitutional: No fever HENT: Negative for ear pain, nosebleeds, congestion, facial swelling, rhinorrhea, neck pain, neck stiffness and ear discharge.   Respiratory: Negative for cough, shortness of breath, wheezing  Cardiovascular: Negative for chest pain, palpitations and leg swelling.  Gastrointestinal: Negative for heartburn,mild  abdominal pain present ,no vomiting, diarrhea or consitpation Genitourinary: Negative for dysuria, urgency, frequency, hematuria Musculoskeletal: Negative for back pain or joint pain Neurological: Negative for dizziness, seizures, syncope, focal weakness,  numbness and headaches.  Hematological: Does not bruise/bleed easily.  Psychiatric/Behavioral: Negative for hallucinations, confusion, dysphoric mood    Tolerating Diet: npo     DRUG ALLERGIES:   Allergies  Allergen Reactions  . Ace Inhibitors Other (See Comments)    Angioedema  . Hydrochlorothiazide W-Triamterene Other (See Comments)    Gout flare  . Lisinopril Swelling and Other (See Comments)  . Penicillins Swelling  . Trichlormethiazide Other (See Comments)    Gout flare    VITALS:  Blood pressure (!) 177/64, pulse 97, temperature 98.6 F (37 C), temperature source Oral, resp. rate 15, height 4\' 11"  (1.499 m), weight 43.8 kg (96 lb 9 oz), SpO2 93 %.  PHYSICAL EXAMINATION:  Constitutional: Appears thin and frail and has NG tube placed no distress. HENT: Normocephalic. Marland Kitchen Oropharynx is clear and moist.  Eyes: Conjunctivae and EOM are normal. PERRLA, no scleral icterus.  Neck: Normal ROM. Neck supple. No JVD. No tracheal  deviation. CVS: RRR, S1/S2 +, no murmurs, no gallops, no carotid bruit.  Pulmonary: Effort and breath sounds normal, no stridor, rhonchi, wheezes, rales.  Abdominal: no BS  ++distension, mild tenderness, NO rebound or guarding.  Musculoskeletal: Normal range of motion. No edema and no tenderness.  Neuro: Alert. CN 2-12 grossly intact. No focal deficits. Skin: Skin is warm and dry. No rash noted. Psychiatric: Normal mood and affect.      LABORATORY PANEL:   CBC Recent Labs  Lab 03/06/18 0621  WBC 9.5  HGB 10.5*  HCT 30.9*  PLT 274   ------------------------------------------------------------------------------------------------------------------  Chemistries  Recent Labs  Lab 03/05/18 1356  03/07/18 0820  NA  --    < > 141  K  --    < > 3.0*  CL  --    < > 106  CO2  --    < > 28  GLUCOSE  --    < > 125*  BUN  --    < > 36*  CREATININE  --    < > 1.18*  CALCIUM  --    < > 8.0*  MG  --    < > 1.9  AST 21  --   --   ALT 12  --   --   ALKPHOS 47  --   --   BILITOT 0.7  --   --    < > = values in this interval not displayed.   ------------------------------------------------------------------------------------------------------------------  Cardiac Enzymes Recent Labs  Lab 03/01/18 2250 03/02/18 0402  TROPONINI 0.04* 0.04*   ------------------------------------------------------------------------------------------------------------------  RADIOLOGY:  Dg Chest 1 View  Result Date: 03/06/2018 CLINICAL DATA:  Congestive heart failure. EXAM:  CHEST  1 VIEW COMPARISON:  One-view chest x-ray 03/05/2018 FINDINGS: Heart is enlarged. NG tube terminates in the stomach. A right-sided PICC line is stable. Interstitial edema and bilateral pleural effusions is stable. Bibasilar airspace disease likely reflects atelectasis. IMPRESSION: 1. Stable appearance of cardiomegaly with interstitial edema and bilateral effusions compatible with congestive heart failure. Electronically  Signed   By: San Morelle M.D.   On: 03/06/2018 07:18   Dg Abd 1 View  Result Date: 03/07/2018 CLINICAL DATA:  Follow up small bowel obstruction EXAM: ABDOMEN - 1 VIEW COMPARISON:  03/06/2018 FINDINGS: Nasogastric catheter is noted coiled within the stomach. Scattered large and small bowel gas is noted. Some mildly prominent loops of small bowel are noted in the mid abdomen relatively stable from the prior exam. No free air is seen. Degenerative changes of lumbar spine are noted. IMPRESSION: Mild gaseous distension of a few small bowel loops within the mid abdomen. Electronically Signed   By: Inez Catalina M.D.   On: 03/07/2018 07:03   Dg Abd 2 Views  Result Date: 03/06/2018 CLINICAL DATA:  Abdominal pain. EXAM: ABDOMEN - 2 VIEW COMPARISON:  03/05/2018 radiographs and prior studies FINDINGS: An NG tube coiled within the stomach again noted. Decreased mild gaseous distension of a few small bowel loops noted. No new findings are identified. IMPRESSION: Decreased mild gaseous distension of a few small bowel loops. NG tube again noted. Electronically Signed   By: Margarette Canada M.D.   On: 03/06/2018 14:51   [place] Ekg Site Rite  Result Date: 03/05/2018 If Site Rite image not attached, placement could not be confirmed due to current cardiac rhythm.    ASSESSMENT AND PLAN:   84 year old female with history of diabetes who is admitted due to abdominal pain   1.  SBO versus High grade ileus : Repeat abdominal x ray reviewed Continue with NG tube and management as per surgery  2.  Hypertensive urgency:  Was on IV nicardipine drip till yesterday Blood pressure better controlled and Cardene drip has been stopped this morning Continue IV metoprolol and hydralazine ,  Monitor BP closely  Appreciate intensivist follow-up  3.  Hypothyroid: Continue Synthroid  4.  Diabetes: Continue sliding scale  5.  Acute kidney injury in the setting of SBO: On IV fluids  6.  Acute hypoxic respiratory failure  in the setting of atelectasis/pulmonary edema: On diuresis prn with lasix  7. Acute Hypokalemia Replace potassium  Physical therapy is recommending skilled nursing facility upon discharge.  Clinical social worker consultation placed.  Management plans discussed with the patient and daughter and she is in agreement.  CODE STATUS: full  TOTAL TIME TAKING CARE OF THIS PATIENT: 33 minutes.     POSSIBLE D/C 2-5 days DEPENDING ON CLINICAL CONDITION.   Saundra Shelling M.D on 03/07/2018 at 12:50 PM  Between 7am to 6pm - Pager - [phone] After 6pm go to [email or web address] - password [email or web address] [facility]  Office  [phone]  CC: Primary care physician; Einar Pheasant, MD  Note: This dictation was prepared with Dragon dictation along with smaller phrase technology. Any transcriptional errors that result from this process are unintentional.

## 2018-03-07 NOTE — Progress Notes (Signed)
PHARMACY - ADULT TOTAL PARENTERAL NUTRITION CONSULT NOTE   Pharmacy Consult for TPN Indication: bowel obstruction  Patient Measurements: Height: 4\' 11"  (149.9 cm) Weight: 96 lb 9 oz (43.8 kg) IBW/kg (Calculated) : 43.2 TPN AdjBW (KG): 46.3 Body mass index is 19.5 kg/m.   Assessment: 84 yo female with SBO starting on TPN  Endo: Pt with hx of DM Insulin requirements in the past 24 hours:  Lytes: K 3.6, Mag 2.3, Phos 4.2 - no supplementation at this time Renal: SCr 1.14, CrCl 18.8 ml/min   TPN Access: Order to place PICC 7/26 TPN start date: To be started 7/26 Nutritional Goals (per RD recommendation on 7/26):  Estimated Nutritional Needs:  Kcal:  1100-1300kcal/day  Protein:  60-70g/day Fluid:  >1.1L/day  Goal TPN rate is 50 ml/hr (provides 1212kcal/day, 60g/day protein, 1367ml volume)  Current Nutrition: NPO  Plan:    SSI 0-9 units ordered q6h and adjust as needed  Will continue Clinimix rate to 50 ml/hr with MVI and trace along with 20% lipid @ 15 ml/hr x 12 hours. Will also replace KCl 10 mEq IV x 3.   Monitor TPN labs, BG F/U in AM  Maxamillion Banas D 03/07/2018,9:04 AM

## 2018-03-07 NOTE — Progress Notes (Signed)
Patient is alert and oriented, reporting no pain. Positive bowel sounds, charted small BM yesterday, reporting no flatus at this time. BP drip off since this morning, BP fluctuating but responding scheduled and PRN IV push medications. Family at bedside today. External female catheter in place. Pt received bath today.Resting quietl between care.

## 2018-03-08 ENCOUNTER — Inpatient Hospital Stay: Payer: Medicare Other

## 2018-03-08 DIAGNOSIS — Z515 Encounter for palliative care: Secondary | ICD-10-CM

## 2018-03-08 DIAGNOSIS — Z7189 Other specified counseling: Secondary | ICD-10-CM

## 2018-03-08 DIAGNOSIS — Z4659 Encounter for fitting and adjustment of other gastrointestinal appliance and device: Secondary | ICD-10-CM

## 2018-03-08 LAB — CBC
HEMATOCRIT: 33.2 % — AB (ref 35.0–47.0)
Hemoglobin: 11.5 g/dL — ABNORMAL LOW (ref 12.0–16.0)
MCH: 31.2 pg (ref 26.0–34.0)
MCHC: 34.5 g/dL (ref 32.0–36.0)
MCV: 90.5 fL (ref 80.0–100.0)
Platelets: 302 10*3/uL (ref 150–440)
RBC: 3.67 MIL/uL — ABNORMAL LOW (ref 3.80–5.20)
RDW: 15.5 % — AB (ref 11.5–14.5)
WBC: 10.2 10*3/uL (ref 3.6–11.0)

## 2018-03-08 LAB — COMPREHENSIVE METABOLIC PANEL
ALK PHOS: 50 U/L (ref 38–126)
ALT: 12 U/L (ref 0–44)
AST: 26 U/L (ref 15–41)
Albumin: 2.3 g/dL — ABNORMAL LOW (ref 3.5–5.0)
Anion gap: 10 (ref 5–15)
BUN: 38 mg/dL — AB (ref 8–23)
CALCIUM: 8.1 mg/dL — AB (ref 8.9–10.3)
CO2: 26 mmol/L (ref 22–32)
CREATININE: 0.95 mg/dL (ref 0.44–1.00)
Chloride: 105 mmol/L (ref 98–111)
GFR calc Af Amer: 56 mL/min — ABNORMAL LOW (ref >60)
GFR calc non Af Amer: 48 mL/min — ABNORMAL LOW (ref >60)
GLUCOSE: 186 mg/dL — AB (ref 70–99)
Potassium: 3.7 mmol/L (ref 3.5–5.1)
SODIUM: 141 mmol/L (ref 135–145)
Total Bilirubin: 0.3 mg/dL (ref 0.3–1.2)
Total Protein: 6.2 g/dL — ABNORMAL LOW (ref 6.5–8.1)

## 2018-03-08 LAB — GLUCOSE, CAPILLARY
GLUCOSE-CAPILLARY: 201 mg/dL — AB (ref 70–99)
GLUCOSE-CAPILLARY: 224 mg/dL — AB (ref 70–99)
GLUCOSE-CAPILLARY: 227 mg/dL — AB (ref 70–99)
Glucose-Capillary: 136 mg/dL — ABNORMAL HIGH (ref 70–99)
Glucose-Capillary: 165 mg/dL — ABNORMAL HIGH (ref 70–99)
Glucose-Capillary: 181 mg/dL — ABNORMAL HIGH (ref 70–99)

## 2018-03-08 LAB — DIFFERENTIAL
BASOS ABS: 0.1 10*3/uL (ref 0–0.1)
BASOS PCT: 1 %
Eosinophils Absolute: 0.2 10*3/uL (ref 0–0.7)
Eosinophils Relative: 2 %
LYMPHS PCT: 24 %
Lymphs Abs: 2.4 10*3/uL (ref 1.0–3.6)
MONOS PCT: 9 %
Monocytes Absolute: 0.9 10*3/uL (ref 0.2–0.9)
NEUTROS ABS: 6.5 10*3/uL (ref 1.4–6.5)
Neutrophils Relative %: 64 %

## 2018-03-08 LAB — TRIGLYCERIDES: TRIGLYCERIDES: 112 mg/dL (ref <150)

## 2018-03-08 LAB — PHOSPHORUS: Phosphorus: 3.3 mg/dL (ref 2.5–4.6)

## 2018-03-08 LAB — PREALBUMIN: Prealbumin: 12.8 mg/dL — ABNORMAL LOW (ref 18–38)

## 2018-03-08 LAB — MAGNESIUM: MAGNESIUM: 1.9 mg/dL (ref 1.7–2.4)

## 2018-03-08 MED ORDER — NICARDIPINE HCL IN NACL 40-0.83 MG/200ML-% IV SOLN: 3.0000 mg/h | INTRAVENOUS | Status: DC

## 2018-03-08 MED ORDER — FUROSEMIDE 10 MG/ML IJ SOLN
INTRAMUSCULAR | Status: AC
  Filled 2018-03-08: qty 4

## 2018-03-08 MED ORDER — POTASSIUM CHLORIDE 10 MEQ/50ML IV SOLN
10.0000 meq | INTRAVENOUS | Status: DC
  Filled 2018-03-08 (×4): qty 50

## 2018-03-08 MED ORDER — FUROSEMIDE 10 MG/ML IJ SOLN
40.0000 mg | Freq: Once | INTRAMUSCULAR | Status: AC
  Administered 2018-03-08: 40 mg via INTRAVENOUS

## 2018-03-08 MED ORDER — NITROGLYCERIN 2 % TD OINT
1.0000 [in_us] | TOPICAL_OINTMENT | Freq: Four times a day (QID) | TRANSDERMAL | Status: DC
  Administered 2018-03-08 – 2018-03-12 (×15): 1 [in_us] via TOPICAL
  Filled 2018-03-08 (×14): qty 1

## 2018-03-08 MED ORDER — MAGNESIUM SULFATE IN D5W 1-5 GM/100ML-% IV SOLN
1.0000 g | Freq: Once | INTRAVENOUS | Status: AC
  Administered 2018-03-08: 1 g via INTRAVENOUS
  Filled 2018-03-08 (×2): qty 100

## 2018-03-08 MED ORDER — INSULIN ASPART 100 UNIT/ML ~~LOC~~ SOLN
0.0000 [IU] | SUBCUTANEOUS | Status: DC
  Administered 2018-03-08 (×2): 3 [IU] via SUBCUTANEOUS
  Administered 2018-03-09: 2 [IU] via SUBCUTANEOUS
  Administered 2018-03-09: 3 [IU] via SUBCUTANEOUS
  Administered 2018-03-09: 8 [IU] via SUBCUTANEOUS
  Administered 2018-03-09: 2 [IU] via SUBCUTANEOUS
  Administered 2018-03-09 – 2018-03-10 (×2): 3 [IU] via SUBCUTANEOUS
  Administered 2018-03-10 (×2): 5 [IU] via SUBCUTANEOUS
  Administered 2018-03-10 (×2): 3 [IU] via SUBCUTANEOUS
  Administered 2018-03-10: 5 [IU] via SUBCUTANEOUS
  Administered 2018-03-11: 3 [IU] via SUBCUTANEOUS
  Administered 2018-03-11: 2 [IU] via SUBCUTANEOUS
  Administered 2018-03-11: 8 [IU] via SUBCUTANEOUS
  Administered 2018-03-12 (×3): 3 [IU] via SUBCUTANEOUS
  Administered 2018-03-12 (×2): 2 [IU] via SUBCUTANEOUS
  Administered 2018-03-12: 3 [IU] via SUBCUTANEOUS
  Administered 2018-03-13 (×2): 2 [IU] via SUBCUTANEOUS
  Administered 2018-03-13: 3 [IU] via SUBCUTANEOUS
  Administered 2018-03-13: 2 [IU] via SUBCUTANEOUS
  Administered 2018-03-13: 3 [IU] via SUBCUTANEOUS
  Administered 2018-03-13: 2 [IU] via SUBCUTANEOUS
  Administered 2018-03-14: 3 [IU] via SUBCUTANEOUS
  Administered 2018-03-14: 2 [IU] via SUBCUTANEOUS
  Filled 2018-03-08 (×27): qty 1

## 2018-03-08 MED ORDER — HEPARIN SODIUM (PORCINE) 5000 UNIT/ML IJ SOLN
5000.0000 [IU] | Freq: Three times a day (TID) | INTRAMUSCULAR | Status: DC
  Administered 2018-03-08 – 2018-03-16 (×22): 5000 [IU] via SUBCUTANEOUS
  Filled 2018-03-08 (×23): qty 1

## 2018-03-08 MED ORDER — POTASSIUM CHLORIDE 10 MEQ/50ML IV SOLN
10.0000 meq | INTRAVENOUS | Status: AC
  Administered 2018-03-08 (×3): 10 meq via INTRAVENOUS
  Filled 2018-03-08 (×3): qty 50

## 2018-03-08 MED ORDER — TRACE MINERALS CR-CU-MN-SE-ZN 10-1000-500-60 MCG/ML IV SOLN
INTRAVENOUS | Status: AC
  Administered 2018-03-08: 18:00:00 via INTRAVENOUS
  Filled 2018-03-08: qty 1200

## 2018-03-08 MED ORDER — FAT EMULSION PLANT BASED 20 % IV EMUL
250.0000 mL | INTRAVENOUS | Status: AC
  Administered 2018-03-08: 250 mL via INTRAVENOUS
  Filled 2018-03-08: qty 250

## 2018-03-08 MED ORDER — SODIUM CHLORIDE 0.9 % IV SOLN
INTRAVENOUS | Status: DC
  Administered 2018-03-08 – 2018-03-09 (×2): via INTRAVENOUS
  Filled 2018-03-08 (×4): qty 16

## 2018-03-08 NOTE — Evaluation (Signed)
Physical Therapy Re-Evaluation Patient Details Name: Tara [name] MRN: [id number] DOB: 10/12/1917 Today's Date: 03/08/2018   History of Present Illness  Pt is a 84 year old female with history of diabetes who is admitted due to abdominal pain with NG tube placed. Assessment includes: SBO versus ileus, accelerated HTN, hypothyroidism, and DM. Pt was transferred to the floor on 03/04/18 but was noted to have hypertensive emergencywhich resulted in pt being admitted back to the ICU on 03/06/18.    Clinical Impression  Pt transferred from the floor back to the ICU with hypertensive emergency with new orders for PT eval and treat received.  Pt re-evaluated secondary to decline in strength and functional mobility compared to previous assessment.  Pt presented with significant deficits in strength, transfers, mobility, gait, balance, and activity tolerance.  Pt required +2 assistance with bed mobility tasks and transfers and had difficulty maintaining upright sitting position upon going initially from sup to sit.  Pt's sitting balance slowly progressed to where pt was able to attempt transfer/amb safely with +2 assist.  Pt was able to stand with extensive assistance with BLEs very weak and tremulous with pt only able to take 2-3 very small shuffling steps at EOB before requiring to return to sitting.  Pt will benefit from PT services in a SNF setting upon discharge to safely address above deficits for decreased caregiver assistance and eventual return to PLOF.      Follow Up Recommendations SNF;Supervision for mobility/OOB    Equipment Recommendations  None recommended by PT    Recommendations for Other Services       Precautions / Restrictions Precautions Precautions: Fall Restrictions Weight Bearing Restrictions: No      Mobility  Bed Mobility Overal bed mobility: Needs Assistance Bed Mobility: Supine to Sit;Sit to Supine     Supine to sit: Mod assist Sit to supine: +2 for physical  assistance;Mod assist   General bed mobility comments: Min verbal cues for sequencing  Transfers Overall transfer level: Needs assistance Equipment used: Rolling walker (2 wheeled) Transfers: Sit to/from Stand Sit to Stand: Min assist;From elevated surface;+2 physical assistance            Ambulation/Gait Ambulation/Gait assistance: +2 physical assistance;Min assist Gait Distance (Feet): 2 Feet Assistive device: Rolling walker (2 wheeled) Gait Pattern/deviations: Step-to pattern;Decreased step length - right;Decreased step length - left     General Gait Details: BLEs very weak and tremulous with pt only able to take 2-3 very small shuffling steps at [employer]  Stairs            Wheelchair Mobility    Modified Rankin (Stroke Patients Only)       Balance Overall balance assessment: Needs assistance Sitting-balance support: Bilateral upper extremity supported Sitting balance-Leahy Scale: Fair     Standing balance support: Bilateral upper extremity supported Standing balance-Leahy Scale: Poor Standing balance comment: Mod A to prevent LOB in standing                             Pertinent Vitals/Pain Pain Assessment: No/denies pain    Home Living Family/patient expects to be discharged to:: Private residence Living Arrangements: Alone Available Help at Discharge: Family;Available PRN/intermittently Type of Home: House Home Access: Ramped entrance     Home Layout: One level Home Equipment: Walker - 2 wheels;Walker - 4 wheels      Prior Function Level of Independence: Needs assistance   Gait / Transfers Assistance Needed: Mod Ind with  amb with a RW limited community distances, Ind with bed mobility and transfers, no recent fall history  ADL's / Homemaking Assistance Needed: Daughter assists with meals, errands, meds, bathing, and dressing        Hand Dominance        Extremity/Trunk Assessment   Upper Extremity Assessment Upper Extremity  Assessment: Generalized weakness    Lower Extremity Assessment Lower Extremity Assessment: Generalized weakness       Communication   Communication: HOH  Cognition Arousal/Alertness: Awake/alert Behavior During Therapy: Flat affect Overall Cognitive Status: Within Functional Limits for tasks assessed                                        General Comments      Exercises Total Joint Exercises Ankle Circles/Pumps: AROM;Both;5 reps;10 reps Quad Sets: Strengthening;Both;5 reps;10 reps Gluteal Sets: Strengthening;Both;10 reps;5 reps Hip ABduction/ADduction: AAROM;Both;10 reps Straight Leg Raises: AAROM;Both;10 reps Long Arc Quad: AROM;Both;5 reps Knee Flexion: Strengthening;Both;5 reps   Assessment/Plan    PT Assessment Patient needs continued PT services  PT Problem List Decreased strength;Decreased activity tolerance;Decreased balance;Decreased mobility;Decreased knowledge of use of DME       PT Treatment Interventions DME instruction;Gait training;Functional mobility training;Balance training;Therapeutic exercise;Therapeutic activities;Patient/family education    PT Goals (Current goals can be found in the Care Plan section)  Acute Rehab PT Goals Patient Stated Goal: To get stronger    Frequency Min 2X/week   Barriers to discharge Inaccessible home environment;Decreased caregiver support      Co-evaluation               AM-PAC PT "6 Clicks" Daily Activity  Outcome Measure Difficulty turning over in bed (including adjusting bedclothes, sheets and blankets)?: Unable Difficulty moving from lying on back to sitting on the side of the bed? : Unable Difficulty sitting down on and standing up from a chair with arms (e.g., wheelchair, bedside commode, etc,.)?: Unable Help needed moving to and from a bed to chair (including a wheelchair)?: A Lot Help needed walking in hospital room?: Total Help needed climbing 3-5 steps with a railing? : Total 6  Click Score: 7    End of Session Equipment Utilized During Treatment: Gait belt Activity Tolerance: Patient limited by fatigue Patient left: in bed;with call bell/phone within reach;Other (comment)(All bed rails up per nursing request) Nurse Communication: Mobility status PT Visit Diagnosis: Unsteadiness on feet (R26.81);Muscle weakness (generalized) (M62.81);Difficulty in walking, not elsewhere classified (R26.2)    Time: 7412-8786 PT Time Calculation (min) (ACUTE ONLY): 26 min   Charges:   PT Evaluation $PT Re-evaluation: 1 Re-eval PT Treatments $Therapeutic Exercise: 8-22 mins        D. Scott Zuriyah Shatz PT, DPT 03/08/18, 1:19 PM

## 2018-03-08 NOTE — Progress Notes (Signed)
Pt reports that she feels short of breath at this time.  Pt placed on Katheran James, NP notified and at bedside.  VO given for patient at this time.

## 2018-03-08 NOTE — Consult Note (Signed)
Consultation Note Date: 03/08/2018   Patient Name: Tara [name]  DOB: [birth year]  MRN: [id number]  Age / Sex: 84 y.o., female  PCP: Einar Pheasant, MD Referring Physician: Saundra Shelling, MD  Reason for Consultation: Establishing goals of care  HPI/Patient Profile: Tara [name] is an 84 y.o. female.past medical history remarkable for hypertension, diabetes, asthma, who was admitted for nausea, vomiting and significant abdominal pain, found to have high-grade ileus versus small bowel obstruction. NGT placed and bowel rest. She was also noted to have hypertensive emergency which was treated. IV medication for HTN.      Clinical Assessment and Goals of Care: Patient is resting in bed. She is alert and oriented. Daughter is at bedside. 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 She lives alone, and daughter lives a mile from her. She has 4 children and is widowed.   She uses a walker at baseline, and is able to perform ADL's. She does not cook, but uses microwave. She is able to make her bed.  She states she has not had a change in appetite, and has always been a petite woman.   We discussed her diagnosis, prognosis, GOC, EOL wishes disposition and options.  A detailed discussion was had today regarding advanced directives. The difference between an aggressive medical intervention path and a  comfort care path was discussed.  Values and goals of care important to patient and family were attempted to be elicited. Natural trajectory and expectations at EOL were discussed.    Ms. Austill states her independence is very important to her. She is tired from the current interventions. She is tired of the NGT. She states she would not want surgery.   She states she is willing to continue current care for the next few days, and may decide to pursue comfort care. She states she wants to speak with the rest of her family before  making decisions on code status or comfort care. Daughter states she wants to honor her mother's wishes.       SUMMARY OF RECOMMENDATIONS   Will follow up tomorrow. Patient wants to speak with family before making any decisions.    Code Status/Advance Care Planning:  Full code    Symptom Management:   Per primary team.   Palliative Prophylaxis:   Eye Care and Oral Care   Prognosis:   Very poor. TPN in place. Ileus with NGT.   Discharge Planning: To Be Determined      Primary Diagnoses: Present on Admission: . Abdominal pain . Hypertensive urgency, malignant   I have reviewed the medical record, interviewed the patient and family, and examined the patient. The following aspects are pertinent.  Past Medical History:  Diagnosis Date  . Asthma   . Diabetes mellitus ([place])   . Environmental allergies   . Hypertension    Social History   Socioeconomic History  . Marital status: Widowed    Spouse name: Not on file  . Number of children: Not on file  . Years of education: Not  on file  . Highest education level: Not on file  Occupational History  . Not on file  Social Needs  . Financial resource strain: Not on file  . Food insecurity:    Worry: Not on file    Inability: Not on file  . Transportation needs:    Medical: Not on file    Non-medical: Not on file  Tobacco Use  . Smoking status: Never Smoker  . Smokeless tobacco: Never Used  Substance and Sexual Activity  . Alcohol use: No    Alcohol/week: 0.0 oz  . Drug use: No  . Sexual activity: Never  Lifestyle  . Physical activity:    Days per week: Not on file    Minutes per session: Not on file  . Stress: Not on file  Relationships  . Social connections:    Talks on phone: Not on file    Gets together: Not on file    Attends religious service: Not on file    Active member of club or organization: Not on file    Attends meetings of clubs or organizations: Not on file    Relationship status: Not  on file  Other Topics Concern  . Not on file  Social History Narrative  . Not on file   Family History  Problem Relation Age of Onset  . Hypertension Mother   . Cancer Brother        lung (question)  . Cancer Sister        questionable type   Scheduled Meds: . chlorhexidine  15 mL Mouth Rinse BID  . cloNIDine  0.2 mg Transdermal Weekly  . enoxaparin (LOVENOX) injection  30 mg Subcutaneous Q24H  . furosemide      . hydrALAZINE  20 mg Intravenous Q6H  . insulin aspart  0-9 Units Subcutaneous Q6H  . levothyroxine  25 mcg Intravenous Daily  . mouth rinse  15 mL Mouth Rinse q12n4p  . metoprolol tartrate  5 mg Intravenous Q6H  . nitroGLYCERIN  1 inch Topical Q6H  . sodium chloride flush  10-40 mL Intracatheter Q12H   Continuous Infusions: . small volume/piggyback builder 25 mL/hr at 03/08/18 0920  . Marland KitchenTPN (CLINIMIX-E) Adult 50 mL/hr at 03/07/18 1821  . famotidine (PEPCID) IV Stopped (03/07/18 1041)  . magnesium sulfate 1 - 4 g bolus IVPB    . potassium chloride     PRN Meds:.acetaminophen **OR** acetaminophen, hydrALAZINE, morphine injection **OR** morphine injection, [DISCONTINUED] ondansetron **OR** ondansetron (ZOFRAN) IV, phenol, sodium chloride, sodium chloride flush Medications Prior to Admission:  Prior to Admission medications   Medication Sig Start Date End Date Taking? Authorizing Provider  acetaminophen (TYLENOL) 325 MG tablet Take 650 mg by mouth every 6 (six) hours as needed for mild pain.    Yes [provider]  amLODipine (NORVASC) 5 MG tablet Take 5 mg by mouth daily.   Yes [provider]  aspirin (ASPIRIN 81) 81 MG EC tablet Take 81 mg by mouth daily. Swallow whole.   Yes [provider]  Calcium Carbonate-Vitamin D (CALTRATE 600+D PO) Take 1 tablet by mouth daily.    Yes [provider]  diclofenac sodium (VOLTAREN) 1 % GEL Apply 4 g topically 3 (three) times daily as needed. Apply to the Right Knee for effusion 12/23/17  Yes  Einar Pheasant, MD  fexofenadine (ALLEGRA) 180 MG tablet Take 1 tablet (180 mg total) by mouth daily. 02/10/18  Yes Einar Pheasant, MD  fluticasone (FLONASE) 50 MCG/ACT nasal spray Place into  both nostrils daily.   Yes [provider]  hydrALAZINE (APRESOLINE) 50 MG tablet TAKE 1 TABLET BY MOUTH 3 TIMES A DAY 02/10/18  Yes Einar Pheasant, MD  metoprolol succinate (TOPROL-XL) 25 MG 24 hr tablet Take 1 tablet (25 mg total) by mouth 2 (two) times daily. 12/21/17  Yes Einar Pheasant, MD  SYNTHROID 50 MCG tablet TAKE ONE (1) TABLET EACH DAY 09/24/17  Yes Einar Pheasant, MD  Amino Acids-Protein Hydrolys (FEEDING SUPPLEMENT, PRO-STAT SUGAR FREE 64,) LIQD Take 30 mLs by mouth 2 (two) times daily between meals.    [provider]  glucose blood (ACCU-CHEK AVIVA PLUS) test strip CHECK SUGAR ONCE DAILY 09/24/15   Einar Pheasant, MD  metoprolol tartrate (LOPRESSOR) 25 MG tablet Take 25 mg by mouth 2 (two) times daily.    [provider]   Allergies  Allergen Reactions  . Ace Inhibitors Other (See Comments)    Angioedema  . Hydrochlorothiazide W-Triamterene Other (See Comments)    Gout flare  . Lisinopril Swelling and Other (See Comments)  . Penicillins Swelling  . Trichlormethiazide Other (See Comments)    Gout flare   Review of Systems  Gastrointestinal: Positive for abdominal distention and abdominal pain.    Physical Exam  Constitutional:  Thin and frail.   Pulmonary/Chest: Effort normal.  Abdominal:  NGT in place.     Vital Signs: BP (!) 160/57   Pulse 94   Temp 99.2 F (37.3 C) (Oral)   Resp (!) 28   Ht 4\' 11"  (1.499 m)   Wt 44 kg (97 lb)   SpO2 94%   BMI 19.59 kg/m  Pain Scale: 0-10 POSS *See Group Information*: 1-Acceptable,Awake and alert Pain Score: 0-No pain   SpO2: SpO2: 94 % O2 Device:SpO2: 94 % O2 Flow Rate: .O2 Flow Rate (L/min): 2 L/min  IO: Intake/output summary:   Intake/Output Summary (Last 24 hours) at 03/08/2018 1101 Last data  filed at 03/08/2018 1000 Gross per 24 hour  Intake 3010.26 ml  Output 960 ml  Net 2050.26 ml    LBM: Last BM Date: 03/06/18 Baseline Weight: Weight: 43.5 kg (96 lb) Most recent weight: Weight: 44 kg (97 lb)     Palliative Assessment/Data: 20%     Time In: 10:00 Time Out: 11:20 Time Total:80 min Greater than 50%  of this time was spent counseling and coordinating care related to the above assessment and plan.  Signed by: Asencion Gowda, NP   Please contact Palliative Medicine Team phone at [phone] for questions and concerns.  For individual provider: See Shea Evans

## 2018-03-08 NOTE — Progress Notes (Signed)
Follow up - Critical Care Medicine Note  Patient Details:    Tara [name] is an 84 y.o. female.past medical history remarkable for hypertension, diabetes, asthma, who was admitted for nausea, vomiting and significant abdominal pain, found to have high-grade ileus versus small bowel obstruction. She was also noted to have hypertensive emergency and was admitted initially to the intensive care unit for a nicardipine infusion. She was subsequently transitioned to scheduled IV hydralazine and Lopressor. Was seen by surgery in consultation for SBO and opted for decompression versus exploratory laparotomy. Patient had worsening hypertension and unable to control with IV medications and will be subsequently transferred back to the intensive care unit for nicardipine infusion.     Lines, Airways, Drains: PICC Double Lumen 03/05/18 PICC Right Brachial 34 cm 0 cm (Active)  Indication for Insertion or Continuance of Line Administration of hyperosmolar/irritating solutions (i.e. TPN, Vancomycin, etc.) 03/07/2018  8:00 AM  Exposed Catheter (cm) 0 cm 03/05/2018  6:49 PM  Site Assessment Clean;Dry;Intact 03/07/2018  8:00 AM  Lumen #1 Status Infusing;Flushed;Blood return noted 03/07/2018  8:00 AM  Lumen #2 Status Infusing;Flushed;Blood return noted;Cap changed 03/07/2018  8:00 AM  Dressing Type Transparent;Occlusive 03/07/2018  8:00 AM  Dressing Status Clean;Dry;Antimicrobial disc in place 03/07/2018  8:00 AM  Line Care Connections checked and tightened 03/07/2018  8:00 AM  Dressing Intervention New dressing 03/05/2018  6:49 PM  Dressing Change Due 03/12/18 03/07/2018 12:00 AM     NG/OG Tube Nasogastric 14 Fr. Right nare (Active)  Site Assessment Clean;Intact;Dry 03/07/2018  8:00 AM  Ongoing Placement Verification No change in respiratory status 03/07/2018  8:00 AM  Status Suction-low intermittent 03/07/2018  8:00 AM  Drainage Appearance Brown 03/07/2018 12:25 AM  Intake (mL) 0 mL 03/06/2018  7:43 AM  Output (mL) 50  mL 03/06/2018  4:00 PM     External Urinary Catheter (Active)  Collection Container Dedicated Suction Canister 03/07/2018  8:00 AM  Output (mL) 100 mL 03/06/2018  7:53 PM    Anti-infectives:  Anti-infectives (From admission, onward)   None      Microbiology: Results for orders placed or performed during the hospital encounter of 03/01/18  MRSA PCR Screening     Status: None   Collection Time: 03/02/18  6:25 AM  Result Value Ref Range Status   MRSA by PCR NEGATIVE NEGATIVE Final    Comment:        The GeneXpert MRSA Assay (FDA approved for NASAL specimens only), is one component of a comprehensive MRSA colonization surveillance program. It is not intended to diagnose MRSA infection nor to guide or monitor treatment for MRSA infections. Performed at [facility], [address], [place], [place] [address]     Studies: Dg Chest 1 View  Result Date: 03/06/2018 CLINICAL DATA:  Congestive heart failure. EXAM: CHEST  1 VIEW COMPARISON:  One-view chest x-ray 03/05/2018 FINDINGS: Heart is enlarged. NG tube terminates in the stomach. A right-sided PICC line is stable. Interstitial edema and bilateral pleural effusions is stable. Bibasilar airspace disease likely reflects atelectasis. IMPRESSION: 1. Stable appearance of cardiomegaly with interstitial edema and bilateral effusions compatible with congestive heart failure. Electronically Signed   By: San Morelle M.D.   On: 03/06/2018 07:18   Dg Chest 1 View  Result Date: 03/05/2018 CLINICAL DATA:  Fever EXAM: CHEST  1 VIEW COMPARISON:  03/03/2018 FINDINGS: Cardiac enlargement with vascular congestion. Progressive vascular congestion and bilateral edema. Bibasilar airspace disease and small effusions left greater than right with mild progression from  the prior study. NG tube in the stomach IMPRESSION: Progression of vascular congestion and edema. Progressive atelectasis and effusion left greater than right. Electronically  Signed   By: Franchot Gallo M.D.   On: 03/05/2018 09:20   Dg Abd 1 View  Result Date: 03/07/2018 CLINICAL DATA:  Follow up small bowel obstruction EXAM: ABDOMEN - 1 VIEW COMPARISON:  03/06/2018 FINDINGS: Nasogastric catheter is noted coiled within the stomach. Scattered large and small bowel gas is noted. Some mildly prominent loops of small bowel are noted in the mid abdomen relatively stable from the prior exam. No free air is seen. Degenerative changes of lumbar spine are noted. IMPRESSION: Mild gaseous distension of a few small bowel loops within the mid abdomen. Electronically Signed   By: Inez Catalina M.D.   On: 03/07/2018 07:03   Dg Abd 1 View  Result Date: 03/05/2018 CLINICAL DATA:  Ileus EXAM: ABDOMEN - 1 VIEW COMPARISON:  03/04/2018 FINDINGS: Enteric tube is looped in the left upper quadrant consistent with location in the body of the stomach. Decreasing gaseous distention of small bowel since previous study. Left upper quadrant small bowel remains distended. Paucity of gas in the colon with stool in the right colon. No radiopaque stones. Degenerative changes in the lumbar spine with lumbar scoliosis convex towards the right. Vascular calcifications. Old fractures of the left superior and right inferior pubic rami. IMPRESSION: Decreasing gaseous distention of small bowel since previous study. Enteric tube is unchanged in position. Electronically Signed   By: Lucienne Capers M.D.   On: 03/05/2018 04:58   Dg Abd 1 View  Result Date: 03/05/2018 CLINICAL DATA:  NG tube placement EXAM: ABDOMEN - 1 VIEW COMPARISON:  03/04/2018, 03/03/2018 FINDINGS: Pleural effusions and bibasilar airspace disease. Esophageal tube is looped back upon itself with the tip positioned near the GE junction. Gaseous enlargement of small bowel up to 3 cm. Vascular calcifications. IMPRESSION: 1. Esophageal tube is looped upon itself in the stomach with the tip projecting near the GE junction and directed cephalad. 2. Dilated  loops of small bowel within the central abdomen and pelvis 3. Pleural effusions and basilar consolidations. Electronically Signed   By: Donavan Foil M.D.   On: 03/05/2018 00:46   Dg Abd 1 View  Result Date: 03/03/2018 CLINICAL DATA:  NG tube placement. EXAM: ABDOMEN - 1 VIEW COMPARISON:  CT abdomen pelvis dated March 01, 2018. FINDINGS: NG tube within the stomach. Mildly dilated loops of air-filled small bowel in the central abdomen. No pneumoperitoneum. No acute osseous abnormality. IMPRESSION: 1. NG tube within the stomach. 2. Unchanged small bowel ileus/obstruction. Electronically Signed   By: Titus Dubin M.D.   On: 03/03/2018 15:59   Dg Chest Port 1 View  Result Date: 03/08/2018 CLINICAL DATA:  Shortness of breath EXAM: PORTABLE CHEST 1 VIEW COMPARISON:  03/06/2018 FINDINGS: Cardiomegaly. Small bilateral pleural effusions. No frank interstitial edema. No pneumothorax. Right arm PICC terminates cavoatrial junction. Enteric tube terminates in the proximal stomach. IMPRESSION: Cardiomegaly with small bilateral pleural effusions. No frank interstitial edema. Electronically Signed   By: Julian Hy M.D.   On: 03/08/2018 02:19   Dg Chest Port 1 View  Result Date: 03/03/2018 CLINICAL DATA:  Fever and hypoxia. EXAM: PORTABLE CHEST 1 VIEW COMPARISON:  Chest x-ray dated October 30, 2017. FINDINGS: The patient is rotated to the right. NG tube within the stomach. Stable cardiomegaly. Normal pulmonary vascularity. Atherosclerotic calcification of the aortic arch. Small bilateral pleural effusions with bibasilar opacities. No pneumothorax. No acute  osseous abnormality. IMPRESSION: 1. Small bilateral pleural effusions with bibasilar opacities, favoring atelectasis. Electronically Signed   By: Titus Dubin M.D.   On: 03/03/2018 16:01   Dg Abd 2 Views  Result Date: 03/06/2018 CLINICAL DATA:  Abdominal pain. EXAM: ABDOMEN - 2 VIEW COMPARISON:  03/05/2018 radiographs and prior studies FINDINGS: An NG  tube coiled within the stomach again noted. Decreased mild gaseous distension of a few small bowel loops noted. No new findings are identified. IMPRESSION: Decreased mild gaseous distension of a few small bowel loops. NG tube again noted. Electronically Signed   By: Margarette Canada M.D.   On: 03/06/2018 14:51   Dg Abd Portable 1v  Result Date: 03/04/2018 CLINICAL DATA:  Small bowel obstruction. EXAM: PORTABLE ABDOMEN - 1 VIEW COMPARISON:  Radiograph of March 03, 2018. FINDINGS: Nasogastric tube is seen looped within the stomach with distal tip in proximal stomach. Improved mild small bowel dilatation is noted concerning for distal small bowel obstruction. Vascular calcifications are noted. IMPRESSION: Nasogastric tube tip seen within proximal stomach. Decreased small bowel dilatation is noted suggesting improving distal small bowel obstruction or ileus. Electronically Signed   By: Marijo Conception, M.D.   On: 03/04/2018 20:53   [place] Ekg Site Rite  Result Date: 03/05/2018 If Site Rite image not attached, placement could not be confirmed due to current cardiac rhythm.  Ct Angio Abd/pel W And/or Wo Contrast  Result Date: 03/02/2018 CLINICAL DATA:  Generalized abdominal pain EXAM: CTA ABDOMEN AND PELVIS wITHOUT AND WITH CONTRAST TECHNIQUE: Multidetector CT imaging of the abdomen and pelvis was performed using the standard protocol during bolus administration of intravenous contrast. Multiplanar reconstructed images and MIPs were obtained and reviewed to evaluate the vascular anatomy. CONTRAST:  64mL ISOVUE-370 IOPAMIDOL (ISOVUE-370) INJECTION 76% COMPARISON:  CT 06/26/2017 FINDINGS: VASCULAR Aorta: Moderate severe aortic atherosclerosis. No aneurysm or dissection. Celiac: Heavily calcified at the origin suspected moderate stenosis at the origin. SMA: Moderate calcification at the origin. Suspected moderate stenosis at the origin of the SMA. Distal vascular patency. Renals: Single right and single left renal arteries  with dense calcification at the origins. Mild to moderate stenosis suspected at the origin of right renal artery. Suspected moderate stenosis at the origin of the left renal artery. IMA: Calcified at the origin.  Distal patency. Inflow: Moderate severe aortic and common iliac disease without occlusion or high-grade focal stenosis. Moderate disease of the internal iliac arteries. No significant stenosis of the external iliac vessels. Proximal Outflow: No significant stenosis at the common femoral or imaged portions of the superficial femoral and profunda arteries. Moderate aortic atherosclerosis. Review of the MIP images confirms the above findings. NON-VASCULAR Lower chest: Lung bases demonstrate no acute consolidation or effusion. There is cardiomegaly. Hepatobiliary: Subcentimeter hypodense liver lesions too small to characterize. Stones in the gallbladder. No biliary enlargement Pancreas: Unremarkable. No pancreatic ductal dilatation or surrounding inflammatory changes. Spleen: Normal in size without focal abnormality. Adrenals/Urinary Tract: Adrenal glands are within normal limits. No hydronephrosis. Subcentimeter cortical hypodense renal lesions too small to further characterize. Bladder unremarkable Stomach/Bowel: Stomach is nonenlarged. Fluid-filled borderline enlarged loops of small bowel, mostly within the pelvis. No significant bowel wall thickening. Poorly identified appendix. Possible fluid-filled bowel in the right groin, no evidence for incarceration. Lymphatic: No significantly enlarged lymph nodes. Reproductive: No adnexal mass. Other: No free air.  Small amount of free fluid in the pelvis. Musculoskeletal: Scoliosis and degenerative changes of the spine. No acute or suspicious abnormality. Diffuse anasarca. IMPRESSION: VASCULAR Negative for  aortic dissection or acute aortic occlusive disease. Moderate severe aortic atherosclerosis with stenosis of branch vessels in the abdomen and pelvis as detailed  above. NON-VASCULAR 1. Borderline fluid-filled enlarged loops distal small bowel primarily within the lower abdomen and pelvis without well-defined transition point. Findings could be secondary to ileus or low-grade obstruction. No free air. 2. Gallstones 3. Small amount of free fluid in the pelvis 4. Cardiomegaly Electronically Signed   By: Donavan Foil M.D.   On: 03/02/2018 00:59   US Abdomen Limited Ruq  Result Date: 03/02/2018 CLINICAL DATA:  Generalized abdominal pain with nausea EXAM: ULTRASOUND ABDOMEN LIMITED RIGHT UPPER QUADRANT COMPARISON:  CT 03/01/2018 FINDINGS: Gallbladder: Small amount of echogenic sludge. Negative sonographic Murphy. Small stones in the gallbladder. Normal wall thickness. Common bile duct: Diameter: 2.9 mm Liver: Hepatic echogenicity within normal limits. Small cysts within the liver measuring up to 1.1 cm. Portal vein is patent on color Doppler imaging with normal direction of blood flow towards the liver. Tiny amount of fluid adjacent to the liver. Tiny amount of right perinephric fluid. IMPRESSION: 1. Small stones and sludge in the gallbladder. No evidence for acute cholecystitis or biliary dilatation. 2. Trace amount of fluid adjacent to the liver and right kidney 3. Small cysts within the liver Electronically Signed   By: Donavan Foil M.D.   On: 03/02/2018 03:32    Consults: Treatment Team:  Arta Silence, MD Benjamine Sprague, DO Saundra Shelling, MD   Subjective:    Overnight Issues: Overnight has remained on the nicardipine infusion.   Objective:  Vital signs for last 24 hours: Temp:  [98.4 F (36.9 C)-98.6 F (37 C)] 98.4 F (36.9 C) (07/28 1200) Pulse Rate:  [80-101] 80 (07/29 0700) Resp:  [14-34] 17 (07/29 0700) BP: (142-195)/(54-123) 144/58 (07/29 0700) SpO2:  [90 %-98 %] 97 % (07/29 0700) Weight:  [96 lb 9 oz (43.8 kg)-97 lb (44 kg)] 97 lb (44 kg) (07/29 0500)  Hemodynamic parameters for last 24 hours:    Intake/Output from previous  day: 07/28 0701 - 07/29 0700 In: 2958 [I.V.:2514.6; IV Piggyback:443.3] Out: 1160 [Urine:1100; Emesis/NG output:60]  Intake/Output this shift: No intake/output data recorded.  Vent settings for last 24 hours:    Physical Exam:  General:NAD Neuro:No focal deficits HEENT:NCAT, sclerae white, oropharynx normal Cardiovascular:Regular, no M Lungs:Clear to auscultation and percussion Abdomen:Moderately distended, moderately firm, mildly tender, + BS in all quadrants Extremities: Warm, no edema  Assessment/Plan:   Hypertensive emergency. On nicardipine infusion. Will add Nitropaste to the scheduled hydralazine and Lopressor  High-grade ileus versus small bowel obstruction. NG tube decompression, being followed by surgery  Protein calorie malnutrition. Started on TPN  Prerenal azotemia. Has slowly improved  Anemia. No evidence of active bleeding  Hyperglycemia. On sliding scale coverage  Critical Care Total Time 35 minutes  Thorsten Climer 03/08/2018  *Care during the described time interval was provided by me and/or other providers on the critical care team.  I have reviewed this patient's available data, including medical history, events of note, physical examination and test results as part of my evaluation. Patient ID: Tara [name], female   DOB: 1918-03-10, 84 y.o.   MRN: [id number]

## 2018-03-08 NOTE — Progress Notes (Signed)
PHARMACY - ADULT TOTAL PARENTERAL NUTRITION CONSULT NOTE   Pharmacy Consult for TPN Indication: bowel obstruction  Patient Measurements: Height: 4\' 11"  (149.9 cm) Weight: 97 lb (44 kg) IBW/kg (Calculated) : 43.2 TPN AdjBW (KG): 46.3 Body mass index is 19.59 kg/m.   Assessment: 84 yo female with SBO starting on TPN  Endo: Pt with hx of DM Insulin requirements in the past 24 hours:  Lytes: K 3.6, Mag 2.3, Phos 4.2 - no supplementation at this time Renal: SCr 1.14, CrCl 18.8 ml/min   TPN Access: Order to place PICC 7/26 TPN start date: To be started 7/26 Nutritional Goals (per RD recommendation on 7/26):  Estimated Nutritional Needs:  Kcal:  1100-1300kcal/day  Protein:  60-70g/day Fluid:  >1.1L/day  Goal TPN rate is 50 ml/hr (provides 1212kcal/day, 60g/day protein, 1361ml volume)  Current Nutrition: NPO  Plan:  Climinix E 5/15 at goal rate of 89mL/hr and Lipids at 50mL/hr for 12 hours. MVI and trace added to TPN.   Will replace potassium 86mEq IV Q1hr x 3 doses and magnesium 1g IV x 1. Goal potassium ~ 4, goal magnesium ~2, and goal phos > 2.5.   Will transition SSI to moderate scale Q4hr. Will consider adding insulin to TPN on 7/30 per diabetes team recommendations.    SSI 0-9 units ordered q6h and adjust as needed  Monitor TPN labs, BG  Will obtain follow up electrolytes with am labs.   Pharmacy will continue to monitor and adjust per consult.    Tara [name],Tara [name] 03/08/2018,4:55 PM

## 2018-03-08 NOTE — Progress Notes (Signed)
Subjective:    Tara [name] is a 84 y.o. female  Hospital stay day 6,   SBO  No acute issues overnight.   Objective:      Temp:  [99.2 F (37.3 C)] 99.2 F (37.3 C) (07/29 0800) Pulse Rate:  [79-101] 81 (07/29 1330) Resp:  [14-34] 17 (07/29 1330) BP: (141-195)/(53-123) 145/63 (07/29 1330) SpO2:  [90 %-98 %] 95 % (07/29 1330) Weight:  [44 kg (97 lb)] 44 kg (97 lb) (07/29 0500)     Height: 4\' 11"  (149.9 cm) Weight: 44 kg (97 lb) BMI (Calculated): 19.58   Intake/Output this shift:  Total I/O In: 1155.3 [I.V.:790.2; IV Piggyback:365.2] Out: 325 [Urine:125; Emesis/NG output:200]   NG with Additional 29ml of output after manipulation at bedside  Constitutional :  alert, cooperative, appears stated age and no distress  Respiratory:  clear to auscultation bilaterally  Cardiovascular:  regular rate and rhythm, S1, S2 normal, no murmur, click, rub or gallop  Gastrointestinal: soft, but still distended with tenderness in all four quadrants but no guarding.  bilious output from NG.   Skin: Cool and moist.   Psychiatric: Normal affect, non-agitated, not confused       LABS:  CMP Latest Ref Rng & Units 03/08/2018 03/07/2018 03/07/2018  Glucose 70 - 99 mg/dL 186(H) 189(H) 125(H)  BUN 8 - 23 mg/dL 38(H) 36(H) 36(H)  Creatinine 0.44 - 1.00 mg/dL 0.95 0.98 1.18(H)  Sodium 135 - 145 mmol/L 141 142 141  Potassium 3.5 - 5.1 mmol/L 3.7 4.3 3.0(L)  Chloride 98 - 111 mmol/L 105 106 106  CO2 22 - 32 mmol/L 26 29 28   Calcium 8.9 - 10.3 mg/dL 8.1(L) 8.1(L) 8.0(L)  Total Protein 6.5 - 8.1 g/dL 6.2(L) - -  Total Bilirubin 0.3 - 1.2 mg/dL 0.3 - -  Alkaline Phos 38 - 126 U/L 50 - -  AST 15 - 41 U/L 26 - -  ALT 0 - 44 U/L 12 - -   CBC Latest Ref Rng & Units 03/08/2018 03/06/2018 03/05/2018  WBC 3.6 - 11.0 K/uL 10.2 9.5 9.9  Hemoglobin 12.0 - 16.0 g/dL 11.5(L) 10.5(L) 10.1(L)  Hematocrit 35.0 - 47.0 % 33.2(L) 30.9(L) 29.6(L)  Platelets 150 - 440 K/uL 302 274 232    RADS: CLINICAL DATA:   Shortness of breath  EXAM: PORTABLE CHEST 1 VIEW  COMPARISON:  03/06/2018  FINDINGS: Cardiomegaly. Small bilateral pleural effusions. No frank interstitial edema. No pneumothorax.  Right arm PICC terminates cavoatrial junction. Enteric tube terminates in the proximal stomach.  IMPRESSION: Cardiomegaly with small bilateral pleural effusions. No frank interstitial edema.   Electronically Signed   By: Julian Hy M.D.   On: 03/08/2018 02:19 Assessment:       SBO vs ileus.  Re-examination of CT scan confirms no definitive transition point.  With no significant change in clinical picture regarding her bowel function, will proceed with small bowel follow through to see if transition point vs overall ileus could be determined.  Pt is still a poor candidate for surgery so will like to avoid if at all possible.  Family member at bedside notified as well.  Critical care management for hypertensive crisis appreciated.

## 2018-03-08 NOTE — Progress Notes (Signed)
Inpatient Diabetes Program Recommendations  AACE/ADA: New Consensus Statement on Inpatient Glycemic Control (2015)  Target Ranges:  Prepandial:   less than 140 mg/dL      Peak postprandial:   less than 180 mg/dL (1-2 hours)      Critically ill patients:  140 - 180 mg/dL   Results for Tara [name], Tara [name] (MRN [id number]) as of 03/08/2018 12:58  Ref. Range 03/07/2018 06:02 03/07/2018 12:09 03/07/2018 18:08 03/08/2018 00:25 03/08/2018 05:29 03/08/2018 11:33  Glucose-Capillary Latest Ref Range: 70 - 99 mg/dL 221 (H) 182 (H) 194 (H) 227 (H) 224 (H) 201 (H)   Review of Glycemic Control  Current orders for Inpatient glycemic control: CBGs with Novolog 0-9 units Q6H  Inpatient Diabetes Program Recommendations:  TPN: Pharmacy may want to consider adding insulin to TPN to help improve glycemic control.  Thanks, Barnie Alderman, RN, MSN, CDE Diabetes Coordinator Inpatient Diabetes Program [phone] (Team Pager from 8am to 5pm)

## 2018-03-08 NOTE — Progress Notes (Signed)
Pt called daughter and daughter on way.  Pt reports feeling a little better at this time.  Pt is A&Ox4, in NAD.

## 2018-03-08 NOTE — Progress Notes (Signed)
[facility] [place] NAME: Tara [name]    MR#:  [id number]  DATE OF BIRTH:  28-May-1918  SUBJECTIVE:  Patient seen and evaluated today Has mild abdominal discomfort On NG tube with suction BP was high last night and started back on IV nicardipine drip no new episodes of fever   REVIEW OF SYSTEMS:    Review of Systems  Constitutional: No fever HENT: Negative for ear pain, nosebleeds, congestion, facial swelling, rhinorrhea, neck pain, neck stiffness and ear discharge.   Respiratory: Negative for cough, shortness of breath, wheezing  Cardiovascular: Negative for chest pain, palpitations and leg swelling.  Gastrointestinal: Negative for heartburn,mild  abdominal pain present ,no vomiting, diarrhea or consitpation Genitourinary: Negative for dysuria, urgency, frequency, hematuria Musculoskeletal: Negative for back pain or joint pain Neurological: Negative for dizziness, seizures, syncope, focal weakness,  numbness and headaches.  Hematological: Does not bruise/bleed easily.  Psychiatric/Behavioral: Negative for hallucinations, confusion, dysphoric mood    Tolerating Diet: npo     DRUG ALLERGIES:   Allergies  Allergen Reactions  . Ace Inhibitors Other (See Comments)    Angioedema  . Hydrochlorothiazide W-Triamterene Other (See Comments)    Gout flare  . Lisinopril Swelling and Other (See Comments)  . Penicillins Swelling  . Trichlormethiazide Other (See Comments)    Gout flare    VITALS:  Blood pressure (!) 145/58, pulse 82, temperature 99.2 F (37.3 C), temperature source Oral, resp. rate 19, height 4\' 11"  (1.499 m), weight 44 kg (97 lb), SpO2 92 %.  PHYSICAL EXAMINATION:  Constitutional: Appears thin and frail and has NG tube placed no distress. HENT: Normocephalic. Marland Kitchen Oropharynx is clear and moist.  Eyes: Conjunctivae and EOM are normal. PERRLA, no scleral icterus.  Neck: Normal ROM. Neck supple. No JVD. No tracheal  deviation. CVS: RRR, S1/S2 +, no murmurs, no gallops, no carotid bruit.  Pulmonary: Effort and breath sounds normal, no stridor, rhonchi, wheezes, rales.  Abdominal: no BS  ++distension, mild tenderness, NO rebound or guarding.  Musculoskeletal: Normal range of motion. No edema and no tenderness.  Neuro: Alert. CN 2-12 grossly intact. No focal deficits. Skin: Skin is warm and dry. No rash noted. Psychiatric: Normal mood and affect.      LABORATORY PANEL:   CBC Recent Labs  Lab 03/08/18 0155  WBC 10.2  HGB 11.5*  HCT 33.2*  PLT 302   ------------------------------------------------------------------------------------------------------------------  Chemistries  Recent Labs  Lab 03/08/18 0155  NA 141  K 3.7  CL 105  CO2 26  GLUCOSE 186*  BUN 38*  CREATININE 0.95  CALCIUM 8.1*  MG 1.9  AST 26  ALT 12  ALKPHOS 50  BILITOT 0.3   ------------------------------------------------------------------------------------------------------------------  Cardiac Enzymes Recent Labs  Lab 03/01/18 2250 03/02/18 0402  TROPONINI 0.04* 0.04*   ------------------------------------------------------------------------------------------------------------------  RADIOLOGY:  Dg Abd 1 View  Result Date: 03/07/2018 CLINICAL DATA:  Follow up small bowel obstruction EXAM: ABDOMEN - 1 VIEW COMPARISON:  03/06/2018 FINDINGS: Nasogastric catheter is noted coiled within the stomach. Scattered large and small bowel gas is noted. Some mildly prominent loops of small bowel are noted in the mid abdomen relatively stable from the prior exam. No free air is seen. Degenerative changes of lumbar spine are noted. IMPRESSION: Mild gaseous distension of a few small bowel loops within the mid abdomen. Electronically Signed   By: Inez Catalina M.D.   On: 03/07/2018 07:03   Dg Chest Port 1 View  Result Date: 03/08/2018 CLINICAL DATA:  Shortness of breath EXAM: PORTABLE CHEST 1 VIEW COMPARISON:  03/06/2018  FINDINGS: Cardiomegaly. Small bilateral pleural effusions. No frank interstitial edema. No pneumothorax. Right arm PICC terminates cavoatrial junction. Enteric tube terminates in the proximal stomach. IMPRESSION: Cardiomegaly with small bilateral pleural effusions. No frank interstitial edema. Electronically Signed   By: Julian Hy M.D.   On: 03/08/2018 02:19     ASSESSMENT AND PLAN:   84 year old female with history of diabetes who is admitted due to abdominal pain   1.  SBO versus High grade ileus : Repeat abdominal x ray reviewed Continue with NG tube and management as per surgery  2.  Hypertensive urgency:   On IV nicardipine drip  Continue scheduled IV metoprolol and hydralazine ,  Monitor BP closely  Appreciate intensivist follow-up  3.  Hypothyroid: Continue Synthroid  4.  Diabetes: Continue sliding scale  5.  Acute kidney injury in the setting of SBO: On IV fluids  6.  Acute hypoxic respiratory failure in the setting of atelectasis/pulmonary edema: On diuresis prn with lasix  7. Hypokalemia improved  Physical therapy is recommending skilled nursing facility upon discharge.  Clinical social worker consultation placed.  Management plans discussed with the patient and daughter and she is in agreement.  CODE STATUS: full  TOTAL TIME TAKING CARE OF THIS PATIENT: 33 minutes.     POSSIBLE D/C 2-5 days DEPENDING ON CLINICAL CONDITION.   Saundra Shelling M.D on 03/08/2018 at 1:12 PM  Between 7am to 6pm - Pager - [phone] After 6pm go to [email or web address] - password [email or web address] [facility]  Office  [phone]  CC: Primary care physician; Einar Pheasant, MD  Note: This dictation was prepared with Dragon dictation along with smaller phrase technology. Any transcriptional errors that result from this process are unintentional.

## 2018-03-09 ENCOUNTER — Encounter
Admission: EM | Disposition: A | Discharge status: Skilled Nursing Facility | Payer: Self-pay | Source: Home / Self Care | Attending: Internal Medicine

## 2018-03-09 ENCOUNTER — Inpatient Hospital Stay: Payer: Medicare Other | Admitting: Anesthesiology

## 2018-03-09 HISTORY — PX: INSERTION OF MESH: SHX5868

## 2018-03-09 HISTORY — PX: LAPAROSCOPY: SHX197

## 2018-03-09 HISTORY — PX: INGUINAL HERNIA REPAIR: SHX194

## 2018-03-09 LAB — BASIC METABOLIC PANEL
Anion gap: 8 (ref 5–15)
BUN: 51 mg/dL — ABNORMAL HIGH (ref 8–23)
CHLORIDE: 104 mmol/L (ref 98–111)
CO2: 26 mmol/L (ref 22–32)
Calcium: 7.8 mg/dL — ABNORMAL LOW (ref 8.9–10.3)
Creatinine, Ser: 1.11 mg/dL — ABNORMAL HIGH (ref 0.44–1.00)
GFR calc Af Amer: 46 mL/min — ABNORMAL LOW (ref >60)
GFR calc non Af Amer: 40 mL/min — ABNORMAL LOW (ref >60)
Glucose, Bld: 202 mg/dL — ABNORMAL HIGH (ref 70–99)
POTASSIUM: 4.4 mmol/L (ref 3.5–5.1)
SODIUM: 138 mmol/L (ref 135–145)

## 2018-03-09 LAB — GLUCOSE, CAPILLARY
GLUCOSE-CAPILLARY: 143 mg/dL — AB (ref 70–99)
GLUCOSE-CAPILLARY: 150 mg/dL — AB (ref 70–99)
GLUCOSE-CAPILLARY: 153 mg/dL — AB (ref 70–99)
GLUCOSE-CAPILLARY: 186 mg/dL — AB (ref 70–99)
GLUCOSE-CAPILLARY: 214 mg/dL — AB (ref 70–99)
GLUCOSE-CAPILLARY: 71 mg/dL (ref 70–99)
Glucose-Capillary: 63 mg/dL — ABNORMAL LOW (ref 70–99)
Glucose-Capillary: 252 mg/dL — ABNORMAL HIGH (ref 70–99)

## 2018-03-09 LAB — PHOSPHORUS: Phosphorus: 4.7 mg/dL — ABNORMAL HIGH (ref 2.5–4.6)

## 2018-03-09 LAB — MAGNESIUM: Magnesium: 2.1 mg/dL (ref 1.7–2.4)

## 2018-03-09 SURGERY — LAPAROSCOPY, DIAGNOSTIC
Anesthesia: General

## 2018-03-09 MED ORDER — LACTATED RINGERS IV SOLN
INTRAVENOUS | Status: DC | PRN
  Administered 2018-03-09: 13:00:00 via INTRAVENOUS

## 2018-03-09 MED ORDER — TRACE MINERALS CR-CU-MN-SE-ZN 10-1000-500-60 MCG/ML IV SOLN
INTRAVENOUS | Status: AC
  Administered 2018-03-09: 18:00:00 via INTRAVENOUS
  Filled 2018-03-09: qty 1200

## 2018-03-09 MED ORDER — DEXAMETHASONE SODIUM PHOSPHATE 10 MG/ML IJ SOLN
INTRAMUSCULAR | Status: DC | PRN
  Administered 2018-03-09: 10 mg via INTRAVENOUS

## 2018-03-09 MED ORDER — SODIUM CHLORIDE 0.9 % IJ SOLN
INTRAMUSCULAR | Status: AC
  Filled 2018-03-09: qty 10

## 2018-03-09 MED ORDER — NEOSTIGMINE METHYLSULFATE 10 MG/10ML IV SOLN
INTRAVENOUS | Status: DC | PRN
  Administered 2018-03-09: 3 mg via INTRAVENOUS

## 2018-03-09 MED ORDER — ROCURONIUM BROMIDE 50 MG/5ML IV SOLN
INTRAVENOUS | Status: AC
  Filled 2018-03-09: qty 1

## 2018-03-09 MED ORDER — LIDOCAINE HCL (CARDIAC) PF 100 MG/5ML IV SOSY
PREFILLED_SYRINGE | INTRAVENOUS | Status: DC | PRN
  Administered 2018-03-09: 60 mg via INTRAVENOUS

## 2018-03-09 MED ORDER — SUCCINYLCHOLINE CHLORIDE 20 MG/ML IJ SOLN
INTRAMUSCULAR | Status: DC | PRN
  Administered 2018-03-09: 60 mg via INTRAVENOUS

## 2018-03-09 MED ORDER — PROPOFOL 10 MG/ML IV BOLUS
INTRAVENOUS | Status: DC | PRN
  Administered 2018-03-09: 50 mg via INTRAVENOUS

## 2018-03-09 MED ORDER — DEXTROSE 50 % IV SOLN
INTRAVENOUS | Status: AC
  Administered 2018-03-09: 25 mL via INTRAVENOUS
  Filled 2018-03-09: qty 50

## 2018-03-09 MED ORDER — DEXTROSE 50 % IV SOLN
25.0000 mL | Freq: Once | INTRAVENOUS | Status: AC
  Administered 2018-03-09: 25 mL via INTRAVENOUS

## 2018-03-09 MED ORDER — BUPIVACAINE-EPINEPHRINE (PF) 0.5% -1:200000 IJ SOLN
INTRAMUSCULAR | Status: DC | PRN
  Administered 2018-03-09: 4 mL

## 2018-03-09 MED ORDER — DEXAMETHASONE SODIUM PHOSPHATE 10 MG/ML IJ SOLN
INTRAMUSCULAR | Status: AC
  Filled 2018-03-09: qty 1

## 2018-03-09 MED ORDER — ONDANSETRON HCL 4 MG/2ML IJ SOLN: 4.0000 mg | Freq: Once | INTRAMUSCULAR | Status: DC | PRN

## 2018-03-09 MED ORDER — ONDANSETRON HCL 4 MG/2ML IJ SOLN
INTRAMUSCULAR | Status: DC | PRN
  Administered 2018-03-09: 4 mg via INTRAVENOUS

## 2018-03-09 MED ORDER — ONDANSETRON HCL 4 MG/2ML IJ SOLN
INTRAMUSCULAR | Status: AC
  Filled 2018-03-09: qty 2

## 2018-03-09 MED ORDER — FENTANYL CITRATE (PF) 100 MCG/2ML IJ SOLN
INTRAMUSCULAR | Status: DC | PRN
  Administered 2018-03-09 (×2): 50 ug via INTRAVENOUS

## 2018-03-09 MED ORDER — PROPOFOL 10 MG/ML IV BOLUS
INTRAVENOUS | Status: AC
  Filled 2018-03-09: qty 20

## 2018-03-09 MED ORDER — ROCURONIUM BROMIDE 100 MG/10ML IV SOLN
INTRAVENOUS | Status: DC | PRN
  Administered 2018-03-09: 30 mg via INTRAVENOUS
  Administered 2018-03-09: 20 mg via INTRAVENOUS

## 2018-03-09 MED ORDER — PHENYLEPHRINE HCL 10 MG/ML IJ SOLN
INTRAMUSCULAR | Status: DC | PRN
  Administered 2018-03-09 (×2): 100 ug via INTRAVENOUS

## 2018-03-09 MED ORDER — GLYCOPYRROLATE 0.2 MG/ML IJ SOLN
INTRAMUSCULAR | Status: AC
  Filled 2018-03-09: qty 2

## 2018-03-09 MED ORDER — EPHEDRINE SULFATE 50 MG/ML IJ SOLN
INTRAMUSCULAR | Status: DC | PRN
  Administered 2018-03-09: 10 mg via INTRAVENOUS

## 2018-03-09 MED ORDER — LIDOCAINE HCL 1 % IJ SOLN
INTRAMUSCULAR | Status: DC | PRN
  Administered 2018-03-09: 4 mL

## 2018-03-09 MED ORDER — CEFAZOLIN SODIUM 1 G IJ SOLR
INTRAMUSCULAR | Status: AC
  Filled 2018-03-09: qty 10

## 2018-03-09 MED ORDER — NEOSTIGMINE METHYLSULFATE 10 MG/10ML IV SOLN
INTRAVENOUS | Status: AC
  Filled 2018-03-09: qty 1

## 2018-03-09 MED ORDER — LIDOCAINE HCL (PF) 2 % IJ SOLN
INTRAMUSCULAR | Status: AC
  Filled 2018-03-09: qty 10

## 2018-03-09 MED ORDER — FENTANYL CITRATE (PF) 100 MCG/2ML IJ SOLN
INTRAMUSCULAR | Status: AC
  Filled 2018-03-09: qty 2

## 2018-03-09 MED ORDER — FENTANYL CITRATE (PF) 100 MCG/2ML IJ SOLN: 25.0000 ug | INTRAMUSCULAR | Status: DC | PRN

## 2018-03-09 MED ORDER — GLYCOPYRROLATE 0.2 MG/ML IJ SOLN
INTRAMUSCULAR | Status: DC | PRN
  Administered 2018-03-09: 0.4 mg via INTRAVENOUS

## 2018-03-09 MED ORDER — CEFAZOLIN SODIUM-DEXTROSE 1-4 GM/50ML-% IV SOLN
INTRAVENOUS | Status: DC | PRN
  Administered 2018-03-09: 1 g via INTRAVENOUS

## 2018-03-09 SURGICAL SUPPLY — 56 items
BLADE SURG 11 STRL SS SAFETY (MISCELLANEOUS) ×4 IMPLANT
BLADE SURG 15 STRL LF DISP TIS (BLADE) ×2 IMPLANT
BLADE SURG 15 STRL SS (BLADE) ×2
CANISTER SUCT 1200ML W/VALVE (MISCELLANEOUS) ×4 IMPLANT
CANNULA DILATOR 10 W/SLV (CANNULA) ×3 IMPLANT
CANNULA DILATOR 10MM W/SLV (CANNULA) ×1
CHLORAPREP W/TINT 26ML (MISCELLANEOUS) ×4 IMPLANT
DEFOGGER SCOPE WARMER CLEARIFY (MISCELLANEOUS) ×4 IMPLANT
DERMABOND ADVANCED (GAUZE/BANDAGES/DRESSINGS) ×2
DERMABOND ADVANCED .7 DNX12 (GAUZE/BANDAGES/DRESSINGS) ×2 IMPLANT
DRAPE LAPAROTOMY 100X77 ABD (DRAPES) ×4 IMPLANT
DRSG OPSITE POSTOP 4X10 (GAUZE/BANDAGES/DRESSINGS) IMPLANT
DRSG OPSITE POSTOP 4X8 (GAUZE/BANDAGES/DRESSINGS) IMPLANT
DRSG TEGADERM 4X10 (GAUZE/BANDAGES/DRESSINGS) IMPLANT
DRSG TELFA 3X8 NADH (GAUZE/BANDAGES/DRESSINGS) IMPLANT
ELECT REM PT RETURN 9FT ADLT (ELECTROSURGICAL) ×4
ELECTRODE REM PT RTRN 9FT ADLT (ELECTROSURGICAL) ×2 IMPLANT
GLOVE BIOGEL PI IND STRL 7.0 (GLOVE) ×2 IMPLANT
GLOVE BIOGEL PI INDICATOR 7.0 (GLOVE) ×2
GLOVE SURG SYN 6.5 ES PF (GLOVE) ×4 IMPLANT
GOWN STRL REUS W/ TWL LRG LVL3 (GOWN DISPOSABLE) ×4 IMPLANT
GOWN STRL REUS W/TWL LRG LVL3 (GOWN DISPOSABLE) ×4
IRRIGATION STRYKERFLOW (MISCELLANEOUS) IMPLANT
IRRIGATOR STRYKERFLOW (MISCELLANEOUS)
KIT TURNOVER KIT A (KITS) ×4 IMPLANT
LABEL OR SOLS (LABEL) IMPLANT
MESH PLUG HERNIA INGUINAL MD (Mesh General) ×4 IMPLANT
NDL INSUFF ACCESS 14 VERSASTEP (NEEDLE) ×4 IMPLANT
NDL SAFETY 18GX1.5 (NEEDLE) ×4 IMPLANT
NEEDLE HYPO 22GX1.5 SAFETY (NEEDLE) ×4 IMPLANT
NEEDLE VERESS 14GA 120MM (NEEDLE) IMPLANT
NS IRRIG 1000ML POUR BTL (IV SOLUTION) ×4 IMPLANT
PACK BASIN MAJOR ARMC (MISCELLANEOUS) ×4 IMPLANT
PACK COLON CLEAN CLOSURE (MISCELLANEOUS) IMPLANT
PACK LAP CHOLECYSTECTOMY (MISCELLANEOUS) ×4 IMPLANT
PENCIL ELECTRO HAND CTR (MISCELLANEOUS) ×4 IMPLANT
SCISSORS METZENBAUM CVD 33 (INSTRUMENTS) IMPLANT
SLEEVE ENDOPATH XCEL 5M (ENDOMECHANICALS) ×4 IMPLANT
SOL ANTI-FOG 6CC FOG-OUT (MISCELLANEOUS) ×2 IMPLANT
SOL FOG-OUT ANTI-FOG 6CC (MISCELLANEOUS) ×2
SPONGE LAP 18X18 RF (DISPOSABLE) ×4 IMPLANT
SUT ETHIBOND NAB MO 7 #0 18IN (SUTURE) ×4 IMPLANT
SUT MNCRL AB 4-0 PS2 18 (SUTURE) ×4 IMPLANT
SUT PDS AB 1 TP1 54 (SUTURE) IMPLANT
SUT SILK 2 0 (SUTURE)
SUT SILK 2-0 18XBRD TIE 12 (SUTURE) IMPLANT
SUT SILK 3 0 (SUTURE)
SUT SILK 3-0 18XBRD TIE 12 (SUTURE) IMPLANT
SUT VIC AB 2-0 SH 27 (SUTURE) ×2
SUT VIC AB 2-0 SH 27XBRD (SUTURE) ×2 IMPLANT
SUT VIC AB 3-0 SH 27 (SUTURE) ×4
SUT VIC AB 3-0 SH 27X BRD (SUTURE) ×4 IMPLANT
TRAY FOLEY MTR SLVR 16FR STAT (SET/KITS/TRAYS/PACK) IMPLANT
TROCAR XCEL BLUNT TIP 100MML (ENDOMECHANICALS) ×8 IMPLANT
TROCAR XCEL NON-BLD 5MMX100MML (ENDOMECHANICALS) ×4 IMPLANT
TUBING INSUFFLATION (TUBING) ×4 IMPLANT

## 2018-03-09 NOTE — Anesthesia Post-op Follow-up Note (Signed)
Anesthesia QCDR form completed.        

## 2018-03-09 NOTE — Anesthesia Procedure Notes (Signed)
Procedure Name: Intubation Date/Time: 03/09/2018 1:12 PM Performed by: Doreen Salvage, CRNA Pre-anesthesia Checklist: Patient identified, Patient being monitored, Timeout performed, Emergency Drugs available and Suction available Patient Re-evaluated:Patient Re-evaluated prior to induction Oxygen Delivery Method: Circle system utilized Preoxygenation: Pre-oxygenation with 100% oxygen Induction Type: IV induction, Rapid sequence and Cricoid Pressure applied Ventilation: Mask ventilation without difficulty Laryngoscope Size: Mac and 3 Grade View: Grade I Tube type: Oral Tube size: 7.0 mm Number of attempts: 1 Airway Equipment and Method: Stylet Placement Confirmation: ETT inserted through vocal cords under direct vision,  positive ETCO2 and breath sounds checked- equal and bilateral Secured at: 21 cm Tube secured with: Tape Dental Injury: Teeth and Oropharynx as per pre-operative assessment

## 2018-03-09 NOTE — Transfer of Care (Signed)
Immediate Anesthesia Transfer of Care Note  Patient: Tara [name]  Procedure(s) Performed: Procedure(s): LAPAROSCOPY DIAGNOSTIC (N/A) HERNIA REPAIR INGUINAL INCARCERATED INSERTION OF MESH  Patient Location: PACU  Anesthesia Type:General  Level of Consciousness: sedated  Airway & Oxygen Therapy: Patient Spontanous Breathing and Patient connected to face mask oxygen  Post-op Assessment: Report given to RN and Post -op Vital signs reviewed and stable  Post vital signs: Reviewed and stable  Last Vitals:  Vitals:   03/09/18 0800 03/09/18 1531  BP: (!) 159/61 (!) 176/68  Pulse: 92 89  Resp: (!) 24 18  Temp: 36.9 C 36.8 C  SpO2: 83% 374%    Complications: No apparent anesthesia complications

## 2018-03-09 NOTE — Progress Notes (Signed)
PT Cancellation Note  Patient Details Name: Tara [name] MRN: [id number] DOB: 1917-11-03   Cancelled Treatment:    Reason Eval/Treat Not Completed: Other (comment)   Discussed with pt's primary nurse.  Stated she was going to OR in a little while.  Will hold session this am.  Will continue as appropriate.   Chesley Noon 03/09/2018, 11:49 AM

## 2018-03-09 NOTE — Progress Notes (Signed)
[facility] [place] NAME: Tara [name]    MR#:  [id number]  DATE OF BIRTH:  [birth year]  SUBJECTIVE:  Patient seen and evaluated today On NG tube with suction  on IV nicardipine drip for blood pressure control no new episodes of fever   REVIEW OF SYSTEMS:    Review of Systems  Constitutional: No fever HENT: Negative for ear pain, nosebleeds, congestion, facial swelling, rhinorrhea, neck pain, neck stiffness and ear discharge.   Respiratory: Negative for cough, shortness of breath, wheezing  Cardiovascular: Negative for chest pain, palpitations and leg swelling.  Gastrointestinal: Negative for heartburn,mild  abdominal pain present ,no vomiting, diarrhea or consitpation Genitourinary: Negative for dysuria, urgency, frequency, hematuria Musculoskeletal: Negative for back pain or joint pain Neurological: Negative for dizziness, seizures, syncope, focal weakness,  numbness and headaches.  Hematological: Does not bruise/bleed easily.  Psychiatric/Behavioral: Negative for hallucinations, confusion, dysphoric mood    Tolerating Diet: npo     DRUG ALLERGIES:   Allergies  Allergen Reactions  . Ace Inhibitors Other (See Comments)    Angioedema  . Hydrochlorothiazide W-Triamterene Other (See Comments)    Gout flare  . Lisinopril Swelling and Other (See Comments)  . Penicillins Swelling  . Trichlormethiazide Other (See Comments)    Gout flare    VITALS:  Blood pressure (!) 159/61, pulse 92, temperature 98.5 F (36.9 C), temperature source Oral, resp. rate (!) 24, height 4\' 11"  (1.499 m), weight 47.4 kg (104 lb 8 oz), SpO2 99 %.  PHYSICAL EXAMINATION:  Constitutional: Appears thin and frail and has NG tube placed no distress. HENT: Normocephalic. Marland Kitchen Oropharynx is clear and moist.  Eyes: Conjunctivae and EOM are normal. PERRLA, no scleral icterus.  Neck: Normal ROM. Neck supple. No JVD. No tracheal deviation. CVS: RRR, S1/S2 +, no  murmurs, no gallops, no carotid bruit.  Pulmonary: Effort and breath sounds normal, no stridor, rhonchi, wheezes, rales.  Abdominal: no BS  ++distension, mild tenderness, NO rebound or guarding.  Musculoskeletal: Normal range of motion. No edema and no tenderness.  Neuro: Alert. CN 2-12 grossly intact. No focal deficits. Skin: Skin is warm and dry. No rash noted. Psychiatric: Normal mood and affect.      LABORATORY PANEL:   CBC Recent Labs  Lab 03/08/18 0155  WBC 10.2  HGB 11.5*  HCT 33.2*  PLT 302   ------------------------------------------------------------------------------------------------------------------  Chemistries  Recent Labs  Lab 03/08/18 0155 03/09/18 0624  NA 141 138  K 3.7 4.4  CL 105 104  CO2 26 26  GLUCOSE 186* 202*  BUN 38* 51*  CREATININE 0.95 1.11*  CALCIUM 8.1* 7.8*  MG 1.9 2.1  AST 26  --   ALT 12  --   ALKPHOS 50  --   BILITOT 0.3  --    ------------------------------------------------------------------------------------------------------------------  Cardiac Enzymes No results for input(s): TROPONINI in the last 168 hours. ------------------------------------------------------------------------------------------------------------------  RADIOLOGY:  Dg Abd 1 View  Result Date: 03/09/2018 CLINICAL DATA:  Small bowel obstruction. Enteric contrast administered 8 hours prior. EXAM: ABDOMEN - 1 VIEW COMPARISON:  Abdominal radiograph yesterday. FINDINGS: Administered enteric contrast within the stomach and small bowel. No visualized enteric contrast in the colon. Enteric tube remains in place tip in the stomach. Scoliotic curvature of spine. There are vascular calcifications. IMPRESSION: Administered enteric contrast within the stomach and small bowel. No enteric contrast has reached the colon. Recommend 24 hour film post contrast administration (1555 hour on 03/09/2018). Electronically Signed   By: Threasa Beards  Ehinger M.D.   On: 03/09/2018 01:59    Dg Chest Port 1 View  Result Date: 03/08/2018 CLINICAL DATA:  Shortness of breath EXAM: PORTABLE CHEST 1 VIEW COMPARISON:  03/06/2018 FINDINGS: Cardiomegaly. Small bilateral pleural effusions. No frank interstitial edema. No pneumothorax. Right arm PICC terminates cavoatrial junction. Enteric tube terminates in the proximal stomach. IMPRESSION: Cardiomegaly with small bilateral pleural effusions. No frank interstitial edema. Electronically Signed   By: Julian Hy M.D.   On: 03/08/2018 02:19     ASSESSMENT AND PLAN:   84 year old female with history of diabetes who is admitted due to abdominal pain   1.  SBO versus High grade ileus :  Continue NG tube and management as per surgery  2.  Hypertensive urgency:   On IV nicardipine drip  Continue scheduled IV metoprolol and hydralazine ,  Add clonidine along with nitropaste BP better controlled Appreciate intensivist follow-up  3.  Hypothyroid: Continue Synthroid  4.  Protein calorie malnutrition On TPN  5.  Acute kidney injury in the setting of SBO: On IV fluids  6.  Acute hypoxic respiratory failure in the setting of atelectasis/pulmonary edema: On diuresis prn with lasix  7. Hypokalemia improved  Physical therapy is recommending skilled nursing facility upon discharge.  Clinical social worker consultation placed.  Management plans discussed with the patient and daughter and she is in agreement.  CODE STATUS: full  TOTAL TIME TAKING CARE OF THIS PATIENT: 33 minutes.     POSSIBLE D/C 2-5 days DEPENDING ON CLINICAL CONDITION.   Saundra Shelling M.D on 03/09/2018 at 12:04 PM  Between 7am to 6pm - Pager - [phone] After 6pm go to [email or web address] - password [email or web address] [facility]  Office  [phone]  CC: Primary care physician; Einar Pheasant, MD  Note: This dictation was prepared with Dragon dictation along with smaller phrase technology. Any transcriptional errors that result from this  process are unintentional.

## 2018-03-09 NOTE — Progress Notes (Signed)
Follow up - Critical Care Medicine Note  Patient Details:    Tara [name] is an 84 y.o. female.past medical history remarkable for hypertension, diabetes, asthma, who was admitted for nausea, vomiting and significant abdominal pain, found to have high-grade ileus versus small bowel obstruction. She was also noted to have hypertensive emergency and was admitted initially to the intensive care unit for a nicardipine infusion. She was subsequently transitioned to scheduled IV hydralazine and Lopressor. Was seen by surgery in consultation for SBO and opted for decompression versus exploratory laparotomy. Patient had worsening hypertension and unable to control with IV medications and will be subsequently transferred back to the intensive care unit for nicardipine infusion.     Lines, Airways, Drains: PICC Double Lumen 03/05/18 PICC Right Brachial 34 cm 0 cm (Active)  Indication for Insertion or Continuance of Line Administration of hyperosmolar/irritating solutions (i.e. TPN, Vancomycin, etc.) 03/07/2018  8:00 AM  Exposed Catheter (cm) 0 cm 03/05/2018  6:49 PM  Site Assessment Clean;Dry;Intact 03/07/2018  8:00 AM  Lumen #1 Status Infusing;Flushed;Blood return noted 03/07/2018  8:00 AM  Lumen #2 Status Infusing;Flushed;Blood return noted;Cap changed 03/07/2018  8:00 AM  Dressing Type Transparent;Occlusive 03/07/2018  8:00 AM  Dressing Status Clean;Dry;Antimicrobial disc in place 03/07/2018  8:00 AM  Line Care Connections checked and tightened 03/07/2018  8:00 AM  Dressing Intervention New dressing 03/05/2018  6:49 PM  Dressing Change Due 03/12/18 03/07/2018 12:00 AM     NG/OG Tube Nasogastric 14 Fr. Right nare (Active)  Site Assessment Clean;Intact;Dry 03/07/2018  8:00 AM  Ongoing Placement Verification No change in respiratory status 03/07/2018  8:00 AM  Status Suction-low intermittent 03/07/2018  8:00 AM  Drainage Appearance Brown 03/07/2018 12:25 AM  Intake (mL) 0 mL 03/06/2018  7:43 AM  Output (mL) 50  mL 03/06/2018  4:00 PM     External Urinary Catheter (Active)  Collection Container Dedicated Suction Canister 03/07/2018  8:00 AM  Output (mL) 100 mL 03/06/2018  7:53 PM    Anti-infectives:  Anti-infectives (From admission, onward)   None      Microbiology: Results for orders placed or performed during the hospital encounter of 03/01/18  MRSA PCR Screening     Status: None   Collection Time: 03/02/18  6:25 AM  Result Value Ref Range Status   MRSA by PCR NEGATIVE NEGATIVE Final    Comment:        The GeneXpert MRSA Assay (FDA approved for NASAL specimens only), is one component of a comprehensive MRSA colonization surveillance program. It is not intended to diagnose MRSA infection nor to guide or monitor treatment for MRSA infections. Performed at [facility], [address] [place], [place] [address]     Studies: Dg Chest 1 View  Result Date: 03/06/2018 CLINICAL DATA:  Congestive heart failure. EXAM: CHEST  1 VIEW COMPARISON:  One-view chest x-ray 03/05/2018 FINDINGS: Heart is enlarged. NG tube terminates in the stomach. A right-sided PICC line is stable. Interstitial edema and bilateral pleural effusions is stable. Bibasilar airspace disease likely reflects atelectasis. IMPRESSION: 1. Stable appearance of cardiomegaly with interstitial edema and bilateral effusions compatible with congestive heart failure. Electronically Signed   By: San Morelle M.D.   On: 03/06/2018 07:18   Dg Chest 1 View  Result Date: 03/05/2018 CLINICAL DATA:  Fever EXAM: CHEST  1 VIEW COMPARISON:  03/03/2018 FINDINGS: Cardiac enlargement with vascular congestion. Progressive vascular congestion and bilateral edema. Bibasilar airspace disease and small effusions left greater than right with mild progression from  the prior study. NG tube in the stomach IMPRESSION: Progression of vascular congestion and edema. Progressive atelectasis and effusion left greater than right. Electronically  Signed   By: Franchot Gallo M.D.   On: 03/05/2018 09:20   Dg Abd 1 View  Result Date: 03/09/2018 CLINICAL DATA:  Small bowel obstruction. Enteric contrast administered 8 hours prior. EXAM: ABDOMEN - 1 VIEW COMPARISON:  Abdominal radiograph yesterday. FINDINGS: Administered enteric contrast within the stomach and small bowel. No visualized enteric contrast in the colon. Enteric tube remains in place tip in the stomach. Scoliotic curvature of spine. There are vascular calcifications. IMPRESSION: Administered enteric contrast within the stomach and small bowel. No enteric contrast has reached the colon. Recommend 24 hour film post contrast administration (1555 hour on 03/09/2018). Electronically Signed   By: Jeb Levering M.D.   On: 03/09/2018 01:59   Dg Abd 1 View  Result Date: 03/07/2018 CLINICAL DATA:  Follow up small bowel obstruction EXAM: ABDOMEN - 1 VIEW COMPARISON:  03/06/2018 FINDINGS: Nasogastric catheter is noted coiled within the stomach. Scattered large and small bowel gas is noted. Some mildly prominent loops of small bowel are noted in the mid abdomen relatively stable from the prior exam. No free air is seen. Degenerative changes of lumbar spine are noted. IMPRESSION: Mild gaseous distension of a few small bowel loops within the mid abdomen. Electronically Signed   By: Inez Catalina M.D.   On: 03/07/2018 07:03   Dg Abd 1 View  Result Date: 03/05/2018 CLINICAL DATA:  Ileus EXAM: ABDOMEN - 1 VIEW COMPARISON:  03/04/2018 FINDINGS: Enteric tube is looped in the left upper quadrant consistent with location in the body of the stomach. Decreasing gaseous distention of small bowel since previous study. Left upper quadrant small bowel remains distended. Paucity of gas in the colon with stool in the right colon. No radiopaque stones. Degenerative changes in the lumbar spine with lumbar scoliosis convex towards the right. Vascular calcifications. Old fractures of the left superior and right inferior  pubic rami. IMPRESSION: Decreasing gaseous distention of small bowel since previous study. Enteric tube is unchanged in position. Electronically Signed   By: Lucienne Capers M.D.   On: 03/05/2018 04:58   Dg Abd 1 View  Result Date: 03/05/2018 CLINICAL DATA:  NG tube placement EXAM: ABDOMEN - 1 VIEW COMPARISON:  03/04/2018, 03/03/2018 FINDINGS: Pleural effusions and bibasilar airspace disease. Esophageal tube is looped back upon itself with the tip positioned near the GE junction. Gaseous enlargement of small bowel up to 3 cm. Vascular calcifications. IMPRESSION: 1. Esophageal tube is looped upon itself in the stomach with the tip projecting near the GE junction and directed cephalad. 2. Dilated loops of small bowel within the central abdomen and pelvis 3. Pleural effusions and basilar consolidations. Electronically Signed   By: Donavan Foil M.D.   On: 03/05/2018 00:46   Dg Abd 1 View  Result Date: 03/03/2018 CLINICAL DATA:  NG tube placement. EXAM: ABDOMEN - 1 VIEW COMPARISON:  CT abdomen pelvis dated March 01, 2018. FINDINGS: NG tube within the stomach. Mildly dilated loops of air-filled small bowel in the central abdomen. No pneumoperitoneum. No acute osseous abnormality. IMPRESSION: 1. NG tube within the stomach. 2. Unchanged small bowel ileus/obstruction. Electronically Signed   By: Titus Dubin M.D.   On: 03/03/2018 15:59   Dg Chest Port 1 View  Result Date: 03/08/2018 CLINICAL DATA:  Shortness of breath EXAM: PORTABLE CHEST 1 VIEW COMPARISON:  03/06/2018 FINDINGS: Cardiomegaly. Small bilateral pleural effusions. No  frank interstitial edema. No pneumothorax. Right arm PICC terminates cavoatrial junction. Enteric tube terminates in the proximal stomach. IMPRESSION: Cardiomegaly with small bilateral pleural effusions. No frank interstitial edema. Electronically Signed   By: Julian Hy M.D.   On: 03/08/2018 02:19   Dg Chest Port 1 View  Result Date: 03/03/2018 CLINICAL DATA:  Fever and  hypoxia. EXAM: PORTABLE CHEST 1 VIEW COMPARISON:  Chest x-ray dated October 30, 2017. FINDINGS: The patient is rotated to the right. NG tube within the stomach. Stable cardiomegaly. Normal pulmonary vascularity. Atherosclerotic calcification of the aortic arch. Small bilateral pleural effusions with bibasilar opacities. No pneumothorax. No acute osseous abnormality. IMPRESSION: 1. Small bilateral pleural effusions with bibasilar opacities, favoring atelectasis. Electronically Signed   By: Titus Dubin M.D.   On: 03/03/2018 16:01   Dg Abd 2 Views  Result Date: 03/06/2018 CLINICAL DATA:  Abdominal pain. EXAM: ABDOMEN - 2 VIEW COMPARISON:  03/05/2018 radiographs and prior studies FINDINGS: An NG tube coiled within the stomach again noted. Decreased mild gaseous distension of a few small bowel loops noted. No new findings are identified. IMPRESSION: Decreased mild gaseous distension of a few small bowel loops. NG tube again noted. Electronically Signed   By: Margarette Canada M.D.   On: 03/06/2018 14:51   Dg Abd Portable 1v  Result Date: 03/04/2018 CLINICAL DATA:  Small bowel obstruction. EXAM: PORTABLE ABDOMEN - 1 VIEW COMPARISON:  Radiograph of March 03, 2018. FINDINGS: Nasogastric tube is seen looped within the stomach with distal tip in proximal stomach. Improved mild small bowel dilatation is noted concerning for distal small bowel obstruction. Vascular calcifications are noted. IMPRESSION: Nasogastric tube tip seen within proximal stomach. Decreased small bowel dilatation is noted suggesting improving distal small bowel obstruction or ileus. Electronically Signed   By: Marijo Conception, M.D.   On: 03/04/2018 20:53   [place] Ekg Site Rite  Result Date: 03/05/2018 If Site Rite image not attached, placement could not be confirmed due to current cardiac rhythm.  Ct Angio Abd/pel W And/or Wo Contrast  Result Date: 03/02/2018 CLINICAL DATA:  Generalized abdominal pain EXAM: CTA ABDOMEN AND PELVIS wITHOUT AND WITH  CONTRAST TECHNIQUE: Multidetector CT imaging of the abdomen and pelvis was performed using the standard protocol during bolus administration of intravenous contrast. Multiplanar reconstructed images and MIPs were obtained and reviewed to evaluate the vascular anatomy. CONTRAST:  4mL ISOVUE-370 IOPAMIDOL (ISOVUE-370) INJECTION 76% COMPARISON:  CT 06/26/2017 FINDINGS: VASCULAR Aorta: Moderate severe aortic atherosclerosis. No aneurysm or dissection. Celiac: Heavily calcified at the origin suspected moderate stenosis at the origin. SMA: Moderate calcification at the origin. Suspected moderate stenosis at the origin of the SMA. Distal vascular patency. Renals: Single right and single left renal arteries with dense calcification at the origins. Mild to moderate stenosis suspected at the origin of right renal artery. Suspected moderate stenosis at the origin of the left renal artery. IMA: Calcified at the origin.  Distal patency. Inflow: Moderate severe aortic and common iliac disease without occlusion or high-grade focal stenosis. Moderate disease of the internal iliac arteries. No significant stenosis of the external iliac vessels. Proximal Outflow: No significant stenosis at the common femoral or imaged portions of the superficial femoral and profunda arteries. Moderate aortic atherosclerosis. Review of the MIP images confirms the above findings. NON-VASCULAR Lower chest: Lung bases demonstrate no acute consolidation or effusion. There is cardiomegaly. Hepatobiliary: Subcentimeter hypodense liver lesions too small to characterize. Stones in the gallbladder. No biliary enlargement Pancreas: Unremarkable. No pancreatic ductal dilatation or  surrounding inflammatory changes. Spleen: Normal in size without focal abnormality. Adrenals/Urinary Tract: Adrenal glands are within normal limits. No hydronephrosis. Subcentimeter cortical hypodense renal lesions too small to further characterize. Bladder unremarkable Stomach/Bowel:  Stomach is nonenlarged. Fluid-filled borderline enlarged loops of small bowel, mostly within the pelvis. No significant bowel wall thickening. Poorly identified appendix. Possible fluid-filled bowel in the right groin, no evidence for incarceration. Lymphatic: No significantly enlarged lymph nodes. Reproductive: No adnexal mass. Other: No free air.  Small amount of free fluid in the pelvis. Musculoskeletal: Scoliosis and degenerative changes of the spine. No acute or suspicious abnormality. Diffuse anasarca. IMPRESSION: VASCULAR Negative for aortic dissection or acute aortic occlusive disease. Moderate severe aortic atherosclerosis with stenosis of branch vessels in the abdomen and pelvis as detailed above. NON-VASCULAR 1. Borderline fluid-filled enlarged loops distal small bowel primarily within the lower abdomen and pelvis without well-defined transition point. Findings could be secondary to ileus or low-grade obstruction. No free air. 2. Gallstones 3. Small amount of free fluid in the pelvis 4. Cardiomegaly Electronically Signed   By: Donavan Foil M.D.   On: 03/02/2018 00:59   US Abdomen Limited Ruq  Result Date: 03/02/2018 CLINICAL DATA:  Generalized abdominal pain with nausea EXAM: ULTRASOUND ABDOMEN LIMITED RIGHT UPPER QUADRANT COMPARISON:  CT 03/01/2018 FINDINGS: Gallbladder: Small amount of echogenic sludge. Negative sonographic Murphy. Small stones in the gallbladder. Normal wall thickness. Common bile duct: Diameter: 2.9 mm Liver: Hepatic echogenicity within normal limits. Small cysts within the liver measuring up to 1.1 cm. Portal vein is patent on color Doppler imaging with normal direction of blood flow towards the liver. Tiny amount of fluid adjacent to the liver. Tiny amount of right perinephric fluid. IMPRESSION: 1. Small stones and sludge in the gallbladder. No evidence for acute cholecystitis or biliary dilatation. 2. Trace amount of fluid adjacent to the liver and right kidney 3. Small cysts  within the liver Electronically Signed   By: Donavan Foil M.D.   On: 03/02/2018 03:32    Consults: Treatment Team:  Arta Silence, MD Benjamine Sprague, DO Saundra Shelling, MD   Subjective:    Overnight Issues: Overnight has remained on the nicardipine infusion.   Objective:  Vital signs for last 24 hours: Temp:  [98.5 F (36.9 C)-98.7 F (37.1 C)] 98.5 F (36.9 C) (07/30 0800) Pulse Rate:  [78-98] 92 (07/30 0800) Resp:  [13-28] 24 (07/30 0800) BP: (127-170)/(53-72) 159/61 (07/30 0800) SpO2:  [92 %-99 %] 99 % (07/30 0800) Weight:  [104 lb 8 oz (47.4 kg)] 104 lb 8 oz (47.4 kg) (07/30 0452)  Hemodynamic parameters for last 24 hours:    Intake/Output from previous day: 07/29 0701 - 07/30 0700 In: 2411.7 [I.V.:1787.5; IV Piggyback:624.2] Out: 975 [Urine:625; Emesis/NG output:350]  Intake/Output this shift: Total I/O In: 65 [I.V.:50; IV Piggyback:15] Out: -   Vent settings for last 24 hours:    Physical Exam:  General:NAD Neuro:No focal deficits HEENT:NCAT, sclerae white, oropharynx normal Cardiovascular:Regular, no M Lungs:Clear to auscultation and percussion Abdomen:Moderately distended, moderately firm, mildly tender, + BS in all quadrants Extremities: Warm, no edema  Assessment/Plan:   Hypertensive emergency. On nicardipine infusion. Will add catapres to the scheduled hydralazine and Lopressor and nitropaiste  High-grade ileus versus small bowel obstruction. NG tube decompression, being followed by surgery  Protein calorie malnutrition. Started on TPN  Prerenal azotemia. Has slowly improved  Anemia. No evidence of active bleeding  Hyperglycemia. On sliding scale coverage  Critical Care Total Time 35 minutes  Kimoni Pickerill 03/09/2018  *  Care during the described time interval was provided by me and/or other providers on the critical care team.  I have reviewed this patient's available data, including medical history, events of note, physical  examination and test results as part of my evaluation. Patient ID: Tara [name], female   DOB: [birth year], 84 y.o.   MRN: [id number] Patient ID: Tara [name], female   DOB: [birth year], 84 y.o.   MRN: [id number]

## 2018-03-09 NOTE — Progress Notes (Signed)
Patient s/p diagnostic laparoscopy with lysis of adhesions and open right inguinal hernia repair with mesh.  Patient tolerated the procedure well and is resting comfortably in hospital bed. Abdomen mildly tender to palpation.  3 laparscopic port sites noted with right inguinal incision; all of which are clean, dry, and intact.  NG tube on continued suction.  Patient is alert and following commands. Overall, doing well.    Ferrel Logan PA-Student

## 2018-03-09 NOTE — Progress Notes (Signed)
Subjective:    Tara [name] is a 84 y.o. female  Hospital stay day 7, Day of Surgery bowel obstruction  NG malfunction overnight, increased pain.  Pulled 1.3L out once fixed this morning, same bilious material.   Objective:      Temp:  [98.5 F (36.9 C)-98.7 F (37.1 C)] 98.5 F (36.9 C) (07/30 0800) Pulse Rate:  [78-98] 92 (07/30 0800) Resp:  [13-27] 24 (07/30 0800) BP: (127-170)/(53-72) 159/61 (07/30 0800) SpO2:  [92 %-99 %] 99 % (07/30 0800) Weight:  [47.4 kg (104 lb 8 oz)] 47.4 kg (104 lb 8 oz) (07/30 0452)     Height: 4\' 11"  (149.9 cm) Weight: 47.4 kg (104 lb 8 oz) BMI (Calculated): 21.09   Intake/Output this shift:  Total I/O In: 65 [I.V.:50; IV Piggyback:15] Out: 1000 [Emesis/NG output:1000]   Intake/Output last 2 shifts:    Constitutional :  alert, cooperative, appears stated age and mild distress  Respiratory:  clear to auscultation bilaterally  Cardiovascular:  regular rate and rhythm, S1, S2 normal, no murmur, click, rub or gallop  Gastrointestinal: increased distention and tenderness even after full decompression compared to yesterday, no guarding, or rebound tenderness..   Skin: Cool and moist.   Psychiatric: Normal affect, non-agitated, not confused       LABS:  CMP Latest Ref Rng & Units 03/09/2018 03/08/2018 03/07/2018  Glucose 70 - 99 mg/dL 202(H) 186(H) 189(H)  BUN 8 - 23 mg/dL 51(H) 38(H) 36(H)  Creatinine 0.44 - 1.00 mg/dL 1.11(H) 0.95 0.98  Sodium 135 - 145 mmol/L 138 141 142  Potassium 3.5 - 5.1 mmol/L 4.4 3.7 4.3  Chloride 98 - 111 mmol/L 104 105 106  CO2 22 - 32 mmol/L 26 26 29   Calcium 8.9 - 10.3 mg/dL 7.8(L) 8.1(L) 8.1(L)  Total Protein 6.5 - 8.1 g/dL - 6.2(L) -  Total Bilirubin 0.3 - 1.2 mg/dL - 0.3 -  Alkaline Phos 38 - 126 U/L - 50 -  AST 15 - 41 U/L - 26 -  ALT 0 - 44 U/L - 12 -   CBC Latest Ref Rng & Units 03/08/2018 03/06/2018 03/05/2018  WBC 3.6 - 11.0 K/uL 10.2 9.5 9.9  Hemoglobin 12.0 - 16.0 g/dL 11.5(L) 10.5(L) 10.1(L)   Hematocrit 35.0 - 47.0 % 33.2(L) 30.9(L) 29.6(L)  Platelets 150 - 440 K/uL 302 274 232    RADS: CLINICAL DATA:  Small bowel obstruction. Enteric contrast administered 8 hours prior.  EXAM: ABDOMEN - 1 VIEW  COMPARISON:  Abdominal radiograph yesterday.  FINDINGS: Administered enteric contrast within the stomach and small bowel. No visualized enteric contrast in the colon. Enteric tube remains in place tip in the stomach. Scoliotic curvature of spine. There are vascular calcifications.  IMPRESSION: Administered enteric contrast within the stomach and small bowel. No enteric contrast has reached the colon. Recommend 24 hour film post contrast administration (1555 hour on 03/09/2018).   Electronically Signed   By: Jeb Levering M.D.   On: 03/09/2018 01:59 Assessment:      Bowel obstruction. I reviewed the films myself and noted majority of contrast still in stomach and only a thin film made it into small bowel, but not colon.  After 5 days of active NG decompression and overall no siginificant improvement, coupled with the 1.3L output this am, I explained that this will unlikely resolve on its own at this point.  I recommended an diagnostic laparoscopy to see if there is any actual transition point that can be managed surgically.  Even if there  is no transition point and this is a abnormal ileus that is causing persistent bowel dilation we will at least know the cause and manage accordingly.  I did reiterate the high risk of the nature of the procedure due to her age and current clinical state.  The patient and daughter at bedside both verbalized understanding yet again and requested to proceed with surgery so she no longer has to be in constant pain without knowing the exact cause.    Scheduled for OR today for diagnostic lap, possible LOA, possible conversion to open, possible bowel resection, possible ostomy.

## 2018-03-09 NOTE — Progress Notes (Signed)
Daily Progress Note   Patient Name: Tara [name]       Date: 03/09/2018 DOB: [birth year]  Age: 84 y.o. MRN#: [id number] Attending Physician: Saundra Shelling, MD Primary Care Physician: Einar Pheasant, MD Admit Date: 03/01/2018  Reason for Consultation/Follow-up: Establishing goals of care  Subjective:  Patient is resting in bed with eyes closed. She does not open her eyes to voice. Daughter Kendrick Fries from conversation yesterday, and daughter [name] present. They  have opted for surgical management as they  state Ms. Cadden has told them she does not want to hurt anymore.   The daughters state they have been advised of risks of surgery. They initially state their mother would never want to be on the ventilator, but would be okay with returning from the OR on the vent post op if needed 2/2 anesthesia. They are prepared to make decisions on withdrawing care if needed. They would want chest compressions, shocks, medications, and a breathing tube if needed for resusitation. Discussed resuscitation and ventilator assistance if ROSC was achieved. They state they will discuss this further as a family.   Length of Stay: 7  Current Medications: Scheduled Meds:  . chlorhexidine  15 mL Mouth Rinse BID  . cloNIDine  0.2 mg Transdermal Weekly  . heparin injection (subcutaneous)  5,000 Units Subcutaneous Q8H  . hydrALAZINE  20 mg Intravenous Q6H  . insulin aspart  0-15 Units Subcutaneous Q4H  . levothyroxine  25 mcg Intravenous Daily  . mouth rinse  15 mL Mouth Rinse q12n4p  . metoprolol tartrate  5 mg Intravenous Q6H  . nitroGLYCERIN  1 inch Topical Q6H  . sodium chloride flush  10-40 mL Intracatheter Q12H    Continuous Infusions: . small volume/piggyback builder 15 mL/hr at 03/09/18 0800  . Marland KitchenTPN  (CLINIMIX-E) Adult 50 mL/hr at 03/09/18 0800  . famotidine (PEPCID) IV Stopped (03/09/18 0952)    PRN Meds: acetaminophen **OR** acetaminophen, hydrALAZINE, morphine injection **OR** morphine injection, [DISCONTINUED] ondansetron **OR** ondansetron (ZOFRAN) IV, phenol, sodium chloride, sodium chloride flush  Physical Exam  Constitutional: No distress.  Pulmonary/Chest: Effort normal.  Abdominal:  NGT            Vital Signs: BP (!) 159/61 (BP Location: Left Arm)   Pulse 92   Temp 98.5 F (36.9 C) (Oral)   Resp (!) 24  Ht 4\' 11"  (1.499 m)   Wt 47.4 kg (104 lb 8 oz)   SpO2 99%   BMI 21.11 kg/m  SpO2: SpO2: 99 % O2 Device: O2 Device: Nasal Cannula O2 Flow Rate: O2 Flow Rate (L/min): 2 L/min  Intake/output summary:   Intake/Output Summary (Last 24 hours) at 03/09/2018 1036 Last data filed at 03/09/2018 0847 Gross per 24 hour  Intake 1869.84 ml  Output 1925 ml  Net -55.16 ml   LBM: Last BM Date: 03/06/18 Baseline Weight: Weight: 43.5 kg (96 lb) Most recent weight: Weight: 47.4 kg (104 lb 8 oz)       Palliative Assessment/Data: NPO     Patient Active Problem List   Diagnosis Date Noted  . Abdominal pain 03/02/2018  . Hypertensive urgency, malignant 03/02/2018  . Difficulty breathing 11/03/2017  . Knee effusion, right 11/03/2017  . Fracture of superior rim of left pubis, subsequent encounter for fracture with routine healing 11/03/2017  . History of falling 11/03/2017  . Protein-calorie malnutrition, severe 11/01/2017  . Hypertensive urgency 10/31/2017  . Bilateral lower extremity edema 09/07/2017  . Right hand pain 08/25/2017  . Leg pain 08/25/2017  . Right groin hernia 07/03/2017  . Abnormal CT of the abdomen 07/03/2017  . Allergic rhinitis 06/17/2017  . Osteoporosis 03/12/2017  . Nasal obstruction 09/03/2016  . Loss of weight 03/05/2015  . Unsteady gait 07/31/2013  . Lower extremity edema 05/15/2013  . Peripheral vascular disease ([place]) 08/29/2012  .  Hypertension 06/11/2012  . Hypercholesteremia 06/11/2012  . Diabetes mellitus with peripheral vascular disease ([place]) 06/11/2012  . Anemia 06/11/2012  . B12 deficiency 06/11/2012  . Hypothyroidism 06/11/2012    Palliative Care Assessment & Plan   Patient Profile:Tara [name] an 84 y.o.female.past medical history remarkable for hypertension, diabetes, asthma, who was admitted for nausea, vomiting and significant abdominal pain, found to have high-grade ileus versus small bowel obstruction. NGT placed and bowel rest. She was also noted to have hypertensive emergency which was treated. IV medication for HTN.    Assessment/Recommendations/Plan:  Plan for operative management today.     Code Status:    Code Status Orders  (From admission, onward)        Start     Ordered   03/02/18 0621  Full code  Continuous     03/02/18 0620    Code Status History    Date Active Date Inactive Code Status Order ID Comments User Context   10/31/2017 0857 11/02/2017 2200 Full Code [id number]  Harrie Foreman, MD Inpatient       Prognosis:  Poor.   Discharge Planning:  To Be Determined  Care plan was discussed with RN, MD  Thank you for allowing the Palliative Medicine Team to assist in the care of this patient.   Total Time 35 min Prolonged Time Billed  no      Greater than 50%  of this time was spent counseling and coordinating care related to the above assessment and plan.  Asencion Gowda, NP  Please contact Palliative Medicine Team phone at [phone] for questions and concerns.

## 2018-03-09 NOTE — Op Note (Signed)
Preoperative diagnosis:  Bowel obstruction  Postoperative diagnosis: intrabdominal adhesions, incarcerated right femoral hernia  Procedure: Diagnostic laparoscopy,  lysis of adhesions, open femoral hernia repair with mesh  Anesthesia: GETA   Surgeon: Benjamine Sprague  Specimen: hernia sace  Complications: None  EBL: 71mL  Wound Classification: Clean   Indications: see HPI  Findings: Intra-abdominal adhesions, femoral hernia, incarcerated bowel, nonperforated non-gangrenous  Description of procedure: The patient was placed on the operating table in the supine position. SCDs placed, pre-op abx administered.  General anesthesia was induced and OG tube placed by anesthesia. A time-out was completed verifying correct patient, procedure, site, positioning, and implant(s) and/or special equipment prior to beginning this procedure. The abdomen was prepped and draped in the usual sterile fashion.  An area in the left upper quadrant was anesthetized with local anesthesia and a 5 mm incision was made with an 11 blade insufflation via Veress needle was completed.  After waiting 15 mmHg of intra-abdominal pressure the needle was removed and a 5 mm trocar was placed through the same incision via an Optiview technique.  After confirming no injury to the surrounding bowel upon entry 2 additional 5 mm trochars were placed on the left lower quadrant under direct visualization here again with no visualization of bowel injury during placement.  Inspection of the abdominal cavity noted dense adhesions to the colon from prior appendectomy.  These adhesions were sharply dissected to free up the colon.  Once the colon was mobile the bowel was run from the ileocecal valve to the point where there was noted to be a incarcerated piece of bowel within a hernia.  The hernia was on the right side attempt was made to reduce the bowel but was unsuccessful via laparoscopic instruments.  At this point decision was made to  desufflate the abdomen and proceed with a open right inguinal hernia repair.  Incision was made over the right inguinal ligament and dissection carried down to the incarcerated hernia sac.  This was gently dissected off the surrounding tissue and the hernia sac was entered.  Once the sac was entered it was noted that piece of very viable bowel was within the sac itself.  This bowel was then separated from the hernia sac itself and reduced into the abdominal cavity.  Excess hernia sac was then ligated using 3-0 Vicryl in the entry point into the hernia sac itself was closed with 3-0 Vicryl as well.  The hernia stalk remnant was then reduced into the abdominal cavity.  Exploration of the hernia defect noted it to be not a inguinal hernia but a femoral hernia with the iliac vessel noted lateral to it.  A medium Bard plug mesh was then inserted into the hernia defect and secured into place at the superior portion and medial portion using 0 Ethibond.  Care was noted to ensure the sutures did not involve the surrounding vessels.  After confirming adequate coverage of the hernia defect with the plug and no evidence of active bleeding the mesh was covered with the superficial layers were approximated using 2-0 Vicryl and 3-0 Vicryl for the deep dermal layer and then 4-0 Monocryl was used in a running subcutaneous manner to close the skin.  Wound was then dressed with Dermabond.  At the end of the procedure sponge and instrument counts were correct.  Patient successfully extubated and transferred to PACU in stable condition.

## 2018-03-09 NOTE — Progress Notes (Signed)
PHARMACY - ADULT TOTAL PARENTERAL NUTRITION CONSULT NOTE   Pharmacy Consult for TPN Indication: bowel obstruction  Patient Measurements: Height: 4\' 11"  (149.9 cm) Weight: 104 lb 8 oz (47.4 kg) IBW/kg (Calculated) : 43.2 TPN AdjBW (KG): 46.3 Body mass index is 21.11 kg/m.   Assessment: 84 yo female with SBO starting on TPN  Endo: Pt with hx of DM Insulin requirements in the past 24 hours: 17units Lytes: K 4.4, Mag 2.1, Phos 4.7 - no supplementation at this time Renal: SCr 1.14, CrCl 18.8 ml/min   TPN Access: Order to place PICC 7/26 TPN start date: To be started 7/26 Nutritional Goals (per RD recommendation on 7/26):  Estimated Nutritional Needs:  Kcal:  1100-1300kcal/day  Protein:  60-70g/day Fluid:  >1.1L/day  Goal TPN rate is 50 ml/hr (provides 1212kcal/day, 60g/day protein, 1352ml volume)  Current Nutrition: NPO  Plan:  Climinix E 5/15 at goal rate of 59mL/hr MVI and trace added to TPN.   Electrolytes are within range with the exception of Phosphorus which is slightly elevated, possible due to hemoconcentration from increased NG output. Will follow up on AM labs and assess need to remove electrolytes from TPN.  Will continue moderate scale SSI Q4hr. Will consider adding insulin to TPN on 7/31 per diabetes team recommendations.   Monitor TPN labs, BG  Will obtain follow up electrolytes with am labs.   Pharmacy will continue to monitor and adjust per consult.    Paulina Fusi, PharmD, BCPS 03/09/2018 1:36 PM

## 2018-03-09 NOTE — Anesthesia Preprocedure Evaluation (Signed)
Anesthesia Evaluation  Patient identified by MRN, date of birth, ID band Patient awake    Reviewed: Allergy & Precautions, NPO status , Patient's Chart, lab work & pertinent test results  History of Anesthesia Complications Negative for: history of anesthetic complications  Airway Mallampati: IV       Dental  (+) Edentulous Upper, Edentulous Lower   Pulmonary asthma , neg sleep apnea, neg COPD,           Cardiovascular hypertension, Pt. on medications (-) Past MI, (-) Peripheral Vascular Disease and (-) CHF (-) dysrhythmias (-) Valvular Problems/Murmurs     Neuro/Psych neg Seizures    GI/Hepatic Neg liver ROS, neg GERD  ,  Endo/Other  diabetes, Type 2, Oral Hypoglycemic AgentsHypothyroidism   Renal/GU negative Renal ROS     Musculoskeletal   Abdominal   Peds  Hematology  (+) anemia ,   Anesthesia Other Findings   Reproductive/Obstetrics                             Anesthesia Physical Anesthesia Plan  ASA: III and emergent  Anesthesia Plan: General   Post-op Pain Management:    Induction: Intravenous  PONV Risk Score and Plan: 3  Airway Management Planned: Oral ETT  Additional Equipment:   Intra-op Plan:   Post-operative Plan:   Informed Consent: I have reviewed the patients History and Physical, chart, labs and discussed the procedure including the risks, benefits and alternatives for the proposed anesthesia with the patient or authorized representative who has indicated his/her understanding and acceptance.     Plan Discussed with:   Anesthesia Plan Comments:         Anesthesia Quick Evaluation

## 2018-03-10 ENCOUNTER — Encounter: Payer: Self-pay | Admitting: Surgery

## 2018-03-10 ENCOUNTER — Ambulatory Visit: Payer: Self-pay | Admitting: Internal Medicine

## 2018-03-10 LAB — GLUCOSE, CAPILLARY
GLUCOSE-CAPILLARY: 203 mg/dL — AB (ref 70–99)
GLUCOSE-CAPILLARY: 153 mg/dL — AB (ref 70–99)
GLUCOSE-CAPILLARY: 119 mg/dL — AB (ref 70–99)
Glucose-Capillary: 178 mg/dL — ABNORMAL HIGH (ref 70–99)
Glucose-Capillary: 228 mg/dL — ABNORMAL HIGH (ref 70–99)
Glucose-Capillary: 194 mg/dL — ABNORMAL HIGH (ref 70–99)

## 2018-03-10 LAB — BASIC METABOLIC PANEL
ANION GAP: 5 (ref 5–15)
BUN: 62 mg/dL — AB (ref 8–23)
CALCIUM: 7.3 mg/dL — AB (ref 8.9–10.3)
CO2: 27 mmol/L (ref 22–32)
Chloride: 107 mmol/L (ref 98–111)
Creatinine, Ser: 1.42 mg/dL — ABNORMAL HIGH (ref 0.44–1.00)
GFR calc Af Amer: 34 mL/min — ABNORMAL LOW (ref >60)
GFR, EST NON AFRICAN AMERICAN: 30 mL/min — AB (ref >60)
GLUCOSE: 227 mg/dL — AB (ref 70–99)
POTASSIUM: 4.8 mmol/L (ref 3.5–5.1)
SODIUM: 139 mmol/L (ref 135–145)

## 2018-03-10 LAB — CBC WITH DIFFERENTIAL/PLATELET
BASOS PCT: 0 %
Basophils Absolute: 0 10*3/uL (ref 0–0.1)
EOS ABS: 0 10*3/uL (ref 0–0.7)
Eosinophils Relative: 0 %
HEMATOCRIT: 27.1 % — AB (ref 35.0–47.0)
HEMOGLOBIN: 8.8 g/dL — AB (ref 12.0–16.0)
LYMPHS PCT: 5 %
Lymphs Abs: 0.6 10*3/uL — ABNORMAL LOW (ref 1.0–3.6)
MCH: 30.6 pg (ref 26.0–34.0)
MCHC: 32.4 g/dL (ref 32.0–36.0)
MCV: 94.4 fL (ref 80.0–100.0)
MONO ABS: 0.3 10*3/uL (ref 0.2–0.9)
Monocytes Relative: 2 %
NEUTROS ABS: 10.7 10*3/uL — AB (ref 1.4–6.5)
NEUTROS PCT: 93 %
Platelets: 275 10*3/uL (ref 150–440)
RBC: 2.86 MIL/uL — ABNORMAL LOW (ref 3.80–5.20)
RDW: 15.2 % — AB (ref 11.5–14.5)
WBC: 11.6 10*3/uL — ABNORMAL HIGH (ref 3.6–11.0)

## 2018-03-10 LAB — PHOSPHORUS: Phosphorus: 5.4 mg/dL — ABNORMAL HIGH (ref 2.5–4.6)

## 2018-03-10 LAB — MAGNESIUM: MAGNESIUM: 2.3 mg/dL (ref 1.7–2.4)

## 2018-03-10 MED ORDER — TRACE MINERALS CR-CU-MN-SE-ZN 10-1000-500-60 MCG/ML IV SOLN
INTRAVENOUS | Status: AC
  Administered 2018-03-10: 19:00:00 via INTRAVENOUS
  Filled 2018-03-10: qty 1200

## 2018-03-10 MED ORDER — FAT EMULSION PLANT BASED 20 % IV EMUL
250.0000 mL | INTRAVENOUS | Status: AC
  Administered 2018-03-10: 250 mL via INTRAVENOUS
  Filled 2018-03-10 (×2): qty 250

## 2018-03-10 NOTE — Anesthesia Postprocedure Evaluation (Signed)
Anesthesia Post Note  Patient: Tara [name]  Procedure(s) Performed: LAPAROSCOPY DIAGNOSTIC (N/A ) HERNIA REPAIR INGUINAL INCARCERATED INSERTION OF MESH  Patient location during evaluation: ICU Anesthesia Type: General Level of consciousness: awake and alert Pain management: pain level controlled Vital Signs Assessment: post-procedure vital signs reviewed and stable Respiratory status: spontaneous breathing, nonlabored ventilation, respiratory function stable and patient connected to nasal cannula oxygen Cardiovascular status: blood pressure returned to baseline and stable Postop Assessment: no apparent nausea or vomiting Anesthetic complications: no     Last Vitals:  Vitals:   03/10/18 0400 03/10/18 0500  BP: (!) 150/60 (!) 143/56  Pulse: 75 80  Resp: (!) 27 (!) 24  Temp:    SpO2: 100% 100%    Last Pain:  Vitals:   03/10/18 0200  TempSrc: Oral  PainSc:                  Alison Stalling

## 2018-03-10 NOTE — Progress Notes (Signed)
Pt rested overnight and remained on TPN gtt.

## 2018-03-10 NOTE — Progress Notes (Signed)
[facility] [place] NAME: Tara [name]    MR#:  [id number]  DATE OF BIRTH:  July 24, 1918  SUBJECTIVE:  Patient seen and evaluated today On NG tube with suction  off IV nicardipine drip for blood pressure control S/p laparotomy    REVIEW OF SYSTEMS:    Review of Systems  Constitutional: No fever HENT: Negative for ear pain, nosebleeds, congestion, facial swelling, rhinorrhea, neck pain, neck stiffness and ear discharge.   Respiratory: Negative for cough, shortness of breath, wheezing  Cardiovascular: Negative for chest pain, palpitations and leg swelling.  Gastrointestinal: Negative for heartburn,mild  abdominal pain present ,no vomiting, diarrhea or consitpation Genitourinary: Negative for dysuria, urgency, frequency, hematuria Musculoskeletal: Negative for back pain or joint pain Neurological: Negative for dizziness, seizures, syncope, focal weakness,  numbness and headaches.  Hematological: Does not bruise/bleed easily.  Psychiatric/Behavioral: Negative for hallucinations, confusion, dysphoric mood   DRUG ALLERGIES:   Allergies  Allergen Reactions  . Ace Inhibitors Other (See Comments)    Angioedema  . Hydrochlorothiazide W-Triamterene Other (See Comments)    Gout flare  . Lisinopril Swelling and Other (See Comments)  . Penicillins Swelling  . Trichlormethiazide Other (See Comments)    Gout flare    VITALS:  Blood pressure (!) 165/54, pulse 77, temperature 98.8 F (37.1 C), temperature source Oral, resp. rate (!) 29, height 4\' 11"  (1.499 m), weight 50.9 kg (112 lb 3.4 oz), SpO2 100 %.  PHYSICAL EXAMINATION:  Constitutional: Appears thin and frail and has NG tube placed no distress. HENT: Normocephalic. Marland Kitchen Oropharynx is clear and moist.  Eyes: Conjunctivae and EOM are normal. PERRLA, no scleral icterus.  Neck: Normal ROM. Neck supple. No JVD. No tracheal deviation. CVS: RRR, S1/S2 +, no murmurs, no gallops, no carotid bruit.   Pulmonary: Effort and breath sounds normal, no stridor, rhonchi, wheezes, rales.  Abdominal: soft, non distended, mild tenderness present Mild tenderness Musculoskeletal: Normal range of motion. No edema and no tenderness.  Neuro: Alert. CN 2-12 grossly intact. No focal deficits. Skin: Skin is warm and dry. No rash noted. Psychiatric: Normal mood and affect.      LABORATORY PANEL:   CBC Recent Labs  Lab 03/10/18 0408  WBC 11.6*  HGB 8.8*  HCT 27.1*  PLT 275   ------------------------------------------------------------------------------------------------------------------  Chemistries  Recent Labs  Lab 03/08/18 0155  03/10/18 0503  NA 141   < > 139  K 3.7   < > 4.8  CL 105   < > 107  CO2 26   < > 27  GLUCOSE 186*   < > 227*  BUN 38*   < > 62*  CREATININE 0.95   < > 1.42*  CALCIUM 8.1*   < > 7.3*  MG 1.9   < > 2.3  AST 26  --   --   ALT 12  --   --   ALKPHOS 50  --   --   BILITOT 0.3  --   --    < > = values in this interval not displayed.   ------------------------------------------------------------------------------------------------------------------  Cardiac Enzymes No results for input(s): TROPONINI in the last 168 hours. ------------------------------------------------------------------------------------------------------------------  RADIOLOGY:  Dg Abd 1 View  Result Date: 03/09/2018 CLINICAL DATA:  Small bowel obstruction. Enteric contrast administered 8 hours prior. EXAM: ABDOMEN - 1 VIEW COMPARISON:  Abdominal radiograph yesterday. FINDINGS: Administered enteric contrast within the stomach and small bowel. No visualized enteric contrast in the colon. Enteric tube remains in place  tip in the stomach. Scoliotic curvature of spine. There are vascular calcifications. IMPRESSION: Administered enteric contrast within the stomach and small bowel. No enteric contrast has reached the colon. Recommend 24 hour film post contrast administration (1555 hour on  03/09/2018). Electronically Signed   By: Jeb Levering M.D.   On: 03/09/2018 01:59     ASSESSMENT AND PLAN:   84 year old female with history of diabetes who is admitted due to abdominal pain   1.  S/p laparotomy and lysis of abdominal adhesions and incarcerated femoral hernia was taken care with femoral hernia repair with mesh Continue NG tube and management  Surgery f/u  2.  Hypertensive urgency:   off IV nicardipine drip  Continue scheduled IV metoprolol and hydralazine ,  Add clonidine along with nitropaste BP better controlled Appreciate intensivist follow-up  3.  Hypothyroid: Continue Synthroid  4.  Protein calorie malnutrition On TPN  5.  Acute kidney injury in the setting of SBO: On IV fluids  6. Hypokalemia improved  Physical therapy is recommending skilled nursing facility upon discharge.  Clinical social worker consultation placed.  Management plans discussed with the patient and daughter and she is in agreement.  CODE STATUS: full  TOTAL TIME TAKING CARE OF THIS PATIENT: 33 minutes.     POSSIBLE D/C 2-5 days DEPENDING ON CLINICAL CONDITION.   Saundra Shelling M.D on 03/10/2018 at 2:06 PM  Between 7am to 6pm - Pager - [phone] After 6pm go to [email or web address] - password [email or web address] [facility]  Office  [phone]  CC: Primary care physician; Einar Pheasant, MD  Note: This dictation was prepared with Dragon dictation along with smaller phrase technology. Any transcriptional errors that result from this process are unintentional.

## 2018-03-10 NOTE — Progress Notes (Signed)
Subjective:    Tara [name] is a 84 y.o. female  Hospital stay day 8, 1 Day Post-Op ex-lap, LOA, open femoral hernia repair  No issues overnight.  Decreased NG output, feeling better.   Objective:      Temp:  [98.3 F (36.8 C)-98.8 F (37.1 C)] 98.8 F (37.1 C) (07/31 0200) Pulse Rate:  [71-89] 72 (07/31 0800) Resp:  [12-32] 30 (07/31 0800) BP: (118-176)/(44-74) 166/59 (07/31 0800) SpO2:  [94 %-100 %] 100 % (07/31 0800) Weight:  [50.9 kg (112 lb 3.4 oz)] 50.9 kg (112 lb 3.4 oz) (07/31 0351)     Height: 4\' 11"  (149.9 cm) Weight: 50.9 kg (112 lb 3.4 oz) BMI (Calculated): 22.65   Intake/Output this shift:  Total I/O In: 300 [I.V.:300] Out: -    Intake/Output last 2 shifts:    Constitutional :  alert, cooperative and no distress  Respiratory:  clear to auscultation bilaterally  Cardiovascular:  regular rate and rhythm, S1, S2 normal, no murmur, click, rub or gallop  Gastrointestinal: soft, non-tender; bowel sounds normal; no masses,  no organomegaly.   Skin: Cool and moist. Groin incision c/d/i. Appropriate tenderness and no evidence of hematoma, fluid collection  Psychiatric: Normal affect, non-agitated, not confused       LABS:  CMP Latest Ref Rng & Units 03/10/2018 03/09/2018 03/08/2018  Glucose 70 - 99 mg/dL 227(H) 202(H) 186(H)  BUN 8 - 23 mg/dL 62(H) 51(H) 38(H)  Creatinine 0.44 - 1.00 mg/dL 1.42(H) 1.11(H) 0.95  Sodium 135 - 145 mmol/L 139 138 141  Potassium 3.5 - 5.1 mmol/L 4.8 4.4 3.7  Chloride 98 - 111 mmol/L 107 104 105  CO2 22 - 32 mmol/L 27 26 26   Calcium 8.9 - 10.3 mg/dL 7.3(L) 7.8(L) 8.1(L)  Total Protein 6.5 - 8.1 g/dL - - 6.2(L)  Total Bilirubin 0.3 - 1.2 mg/dL - - 0.3  Alkaline Phos 38 - 126 U/L - - 50  AST 15 - 41 U/L - - 26  ALT 0 - 44 U/L - - 12   CBC Latest Ref Rng & Units 03/10/2018 03/08/2018 03/06/2018  WBC 3.6 - 11.0 K/uL 11.6(H) 10.2 9.5  Hemoglobin 12.0 - 16.0 g/dL 8.8(L) 11.5(L) 10.5(L)  Hematocrit 35.0 - 47.0 % 27.1(L) 33.2(L) 30.9(L)   Platelets 150 - 440 K/uL 275 302 274    RADS:  Assessment:       Status post exploratory laparotomy lysis of adhesions open right femoral hernia repair with mesh.  Patient is recovering well and is endorsing flatus this a.m.  We will clamp NG tube and check residuals in 3 hours.  If less than 250 mL's we will go ahead and pull NG.  Okay to start ice chips and hard candy.  Noted decreased hemoglobin level on labs this a.m. and increased creatinine level.  Hopefully just a reactive issue from surgery yesterday we will continue to monitor.  From surgery perspective okay to be transferred to regular for as long his hypertension is controlled with oral meds and IV pushes

## 2018-03-10 NOTE — Progress Notes (Signed)
Patient is alert and oriented X4. Follows commands, denies all pain, family at bedside. Surgery came by and clamped NG tube and stated that after a few hours we can assess for any residual present and then remove. Tolerated medications well, will continue to monitor.

## 2018-03-10 NOTE — Progress Notes (Addendum)
Daily Progress Note   Patient Name: Tara [name]       Date: 03/10/2018 DOB: [birth year]  Age: 84 y.o. MRN#: [id number] Attending Physician: Tara Shelling, MD Primary Care Physician: Tara Pheasant, MD Admit Date: 03/01/2018  Reason for Consultation/Follow-up: Establishing goals of care  Subjective:  S/p OR yesterday with general surgery. Patient is sitting in bedside chair. She is cheerfully talkative and engaging everyone who enters room.  She denies pain and states she feels much better. No complaints.   Length of Stay: 8  Current Medications: Scheduled Meds:  . chlorhexidine  15 mL Mouth Rinse BID  . cloNIDine  0.2 mg Transdermal Weekly  . heparin injection (subcutaneous)  5,000 Units Subcutaneous Q8H  . hydrALAZINE  20 mg Intravenous Q6H  . insulin aspart  0-15 Units Subcutaneous Q4H  . levothyroxine  25 mcg Intravenous Daily  . mouth rinse  15 mL Mouth Rinse q12n4p  . metoprolol tartrate  5 mg Intravenous Q6H  . nitroGLYCERIN  1 inch Topical Q6H  . sodium chloride flush  10-40 mL Intracatheter Q12H    Continuous Infusions: . small volume/piggyback builder Stopped (03/09/18 1715)  . Marland KitchenTPN (CLINIMIX-E) Adult 50 mL/hr at 03/10/18 1200  . famotidine (PEPCID) IV Stopped (03/10/18 1126)  . Fat emulsion    . TPN (CLINIMIX) Adult without lytes      PRN Meds: acetaminophen **OR** acetaminophen, hydrALAZINE, morphine injection **OR** morphine injection, [DISCONTINUED] ondansetron **OR** ondansetron (ZOFRAN) IV, phenol, sodium chloride, sodium chloride flush  Physical Exam  Constitutional: No distress.  Pulmonary/Chest: Effort normal.  Neurological: She is alert.            Vital Signs: BP (!) 163/60   Pulse 69   Temp 98.8 F (37.1 C) (Oral)   Resp (!) 26   Ht 4\' 11"   (1.499 m)   Wt 50.9 kg (112 lb 3.4 oz)   SpO2 98%   BMI 22.66 kg/m  SpO2: SpO2: 98 % O2 Device: O2 Device: Nasal Cannula O2 Flow Rate: O2 Flow Rate (L/min): 2 L/min  Intake/output summary:   Intake/Output Summary (Last 24 hours) at 03/10/2018 1520 Last data filed at 03/10/2018 1200 Gross per 24 hour  Intake 1390.83 ml  Output 350 ml  Net 1040.83 ml   LBM: Last BM Date: 03/06/18 Baseline Weight: Weight: 43.5 kg (96  lb) Most recent weight: Weight: 50.9 kg (112 lb 3.4 oz)       Palliative Assessment/Data:    Flowsheet Rows     Most Recent Value  Intake Tab  Referral Department  Hospitalist  Unit at Time of Referral  Med/Surg Unit  Palliative Care Primary Diagnosis  Other (Comment) [GI]  Date Notified  03/05/18  Palliative Care Type  New Palliative care  Reason for referral  Clarify Goals of Care  Date of Admission  03/01/18  Date first seen by Palliative Care  03/08/18  # of days Palliative referral response time  3 Day(s)  # of days IP prior to Palliative referral  4  Clinical Assessment  Psychosocial & Spiritual Assessment  Palliative Care Outcomes      Patient Active Problem List   Diagnosis Date Noted  . Abdominal pain 03/02/2018  . Hypertensive urgency, malignant 03/02/2018  . Difficulty breathing 11/03/2017  . Knee effusion, right 11/03/2017  . Fracture of superior rim of left pubis, subsequent encounter for fracture with routine healing 11/03/2017  . History of falling 11/03/2017  . Protein-calorie malnutrition, severe 11/01/2017  . Hypertensive urgency 10/31/2017  . Bilateral lower extremity edema 09/07/2017  . Right hand pain 08/25/2017  . Leg pain 08/25/2017  . Right groin hernia 07/03/2017  . Abnormal CT of the abdomen 07/03/2017  . Allergic rhinitis 06/17/2017  . Osteoporosis 03/12/2017  . Nasal obstruction 09/03/2016  . Loss of weight 03/05/2015  . Unsteady gait 07/31/2013  . Lower extremity edema 05/15/2013  . Peripheral vascular disease  ([place]) 08/29/2012  . Hypertension 06/11/2012  . Hypercholesteremia 06/11/2012  . Diabetes mellitus with peripheral vascular disease ([place]) 06/11/2012  . Anemia 06/11/2012  . B12 deficiency 06/11/2012  . Hypothyroidism 06/11/2012    Palliative Care Assessment & Plan   Patient Profile:Tara [name] an 84 y.o.female.past medical history remarkable for hypertension, diabetes, asthma, who was admitted for nausea, vomiting and significant abdominal pain, found to have high-grade ileus versus small bowel obstruction. NGT placed and bowel rest. She was also noted to have hypertensive emergency which was treated. IV medication for HTN.    Assessment/Recommendations/Plan: Continue current care.    Code Status:    Code Status Orders  (From admission, onward)        Start     Ordered   03/02/18 0621  Full code  Continuous     03/02/18 0620    Code Status History    Date Active Date Inactive Code Status Order ID Comments User Context   10/31/2017 0857 11/02/2017 2200 Full Code [id number]  Tara Foreman, MD Inpatient        Discharge Planning:  To Be Determined    Thank you for allowing the Palliative Medicine Team to assist in the care of this patient.   Total Time 15 min Prolonged Time Billed  no      Greater than 50%  of this time was spent counseling and coordinating care related to the above assessment and plan.  Tara Gowda, NP  Please contact Palliative Medicine Team phone at [phone] for questions and concerns.

## 2018-03-10 NOTE — Progress Notes (Signed)
Follow up - Critical Care Medicine Note  Patient Details:    Tara [name] is an 84 y.o. female.past medical history remarkable for hypertension, diabetes, asthma, who was admitted for nausea, vomiting and significant abdominal pain, found to have high-grade ileus versus small bowel obstruction. She was also noted to have hypertensive emergency and was admitted initially to the intensive care unit for a nicardipine infusion. She was subsequently transitioned to scheduled IV hydralazine and Lopressor. Was seen by surgery in consultation for SBO and opted for decompression versus exploratory laparotomy. Patient had worsening hypertension and unable to control with IV medications and will be subsequently transferred back to the intensive care unit for nicardipine infusion.     Lines, Airways, Drains: PICC Double Lumen 03/05/18 PICC Right Brachial 34 cm 0 cm (Active)  Indication for Insertion or Continuance of Line Administration of hyperosmolar/irritating solutions (i.e. TPN, Vancomycin, etc.) 03/07/2018  8:00 AM  Exposed Catheter (cm) 0 cm 03/05/2018  6:49 PM  Site Assessment Clean;Dry;Intact 03/07/2018  8:00 AM  Lumen #1 Status Infusing;Flushed;Blood return noted 03/07/2018  8:00 AM  Lumen #2 Status Infusing;Flushed;Blood return noted;Cap changed 03/07/2018  8:00 AM  Dressing Type Transparent;Occlusive 03/07/2018  8:00 AM  Dressing Status Clean;Dry;Antimicrobial disc in place 03/07/2018  8:00 AM  Line Care Connections checked and tightened 03/07/2018  8:00 AM  Dressing Intervention New dressing 03/05/2018  6:49 PM  Dressing Change Due 03/12/18 03/07/2018 12:00 AM     NG/OG Tube Nasogastric 14 Fr. Right nare (Active)  Site Assessment Clean;Intact;Dry 03/07/2018  8:00 AM  Ongoing Placement Verification No change in respiratory status 03/07/2018  8:00 AM  Status Suction-low intermittent 03/07/2018  8:00 AM  Drainage Appearance Brown 03/07/2018 12:25 AM  Intake (mL) 0 mL 03/06/2018  7:43 AM  Output (mL) 50  mL 03/06/2018  4:00 PM     External Urinary Catheter (Active)  Collection Container Dedicated Suction Canister 03/07/2018  8:00 AM  Output (mL) 100 mL 03/06/2018  7:53 PM    Anti-infectives:  Anti-infectives (From admission, onward)   None      Microbiology: Results for orders placed or performed during the hospital encounter of 03/01/18  MRSA PCR Screening     Status: None   Collection Time: 03/02/18  6:25 AM  Result Value Ref Range Status   MRSA by PCR NEGATIVE NEGATIVE Final    Comment:        The GeneXpert MRSA Assay (FDA approved for NASAL specimens only), is one component of a comprehensive MRSA colonization surveillance program. It is not intended to diagnose MRSA infection nor to guide or monitor treatment for MRSA infections. Performed at [facility], [address], [place], [place] [address]     Studies: Dg Chest 1 View  Result Date: 03/06/2018 CLINICAL DATA:  Congestive heart failure. EXAM: CHEST  1 VIEW COMPARISON:  One-view chest x-ray 03/05/2018 FINDINGS: Heart is enlarged. NG tube terminates in the stomach. A right-sided PICC line is stable. Interstitial edema and bilateral pleural effusions is stable. Bibasilar airspace disease likely reflects atelectasis. IMPRESSION: 1. Stable appearance of cardiomegaly with interstitial edema and bilateral effusions compatible with congestive heart failure. Electronically Signed   By: San Morelle M.D.   On: 03/06/2018 07:18   Dg Chest 1 View  Result Date: 03/05/2018 CLINICAL DATA:  Fever EXAM: CHEST  1 VIEW COMPARISON:  03/03/2018 FINDINGS: Cardiac enlargement with vascular congestion. Progressive vascular congestion and bilateral edema. Bibasilar airspace disease and small effusions left greater than right with mild progression from  the prior study. NG tube in the stomach IMPRESSION: Progression of vascular congestion and edema. Progressive atelectasis and effusion left greater than right. Electronically  Signed   By: Franchot Gallo M.D.   On: 03/05/2018 09:20   Dg Abd 1 View  Result Date: 03/09/2018 CLINICAL DATA:  Small bowel obstruction. Enteric contrast administered 8 hours prior. EXAM: ABDOMEN - 1 VIEW COMPARISON:  Abdominal radiograph yesterday. FINDINGS: Administered enteric contrast within the stomach and small bowel. No visualized enteric contrast in the colon. Enteric tube remains in place tip in the stomach. Scoliotic curvature of spine. There are vascular calcifications. IMPRESSION: Administered enteric contrast within the stomach and small bowel. No enteric contrast has reached the colon. Recommend 24 hour film post contrast administration (1555 hour on 03/09/2018). Electronically Signed   By: Jeb Levering M.D.   On: 03/09/2018 01:59   Dg Abd 1 View  Result Date: 03/07/2018 CLINICAL DATA:  Follow up small bowel obstruction EXAM: ABDOMEN - 1 VIEW COMPARISON:  03/06/2018 FINDINGS: Nasogastric catheter is noted coiled within the stomach. Scattered large and small bowel gas is noted. Some mildly prominent loops of small bowel are noted in the mid abdomen relatively stable from the prior exam. No free air is seen. Degenerative changes of lumbar spine are noted. IMPRESSION: Mild gaseous distension of a few small bowel loops within the mid abdomen. Electronically Signed   By: Inez Catalina M.D.   On: 03/07/2018 07:03   Dg Abd 1 View  Result Date: 03/05/2018 CLINICAL DATA:  Ileus EXAM: ABDOMEN - 1 VIEW COMPARISON:  03/04/2018 FINDINGS: Enteric tube is looped in the left upper quadrant consistent with location in the body of the stomach. Decreasing gaseous distention of small bowel since previous study. Left upper quadrant small bowel remains distended. Paucity of gas in the colon with stool in the right colon. No radiopaque stones. Degenerative changes in the lumbar spine with lumbar scoliosis convex towards the right. Vascular calcifications. Old fractures of the left superior and right inferior  pubic rami. IMPRESSION: Decreasing gaseous distention of small bowel since previous study. Enteric tube is unchanged in position. Electronically Signed   By: Lucienne Capers M.D.   On: 03/05/2018 04:58   Dg Abd 1 View  Result Date: 03/05/2018 CLINICAL DATA:  NG tube placement EXAM: ABDOMEN - 1 VIEW COMPARISON:  03/04/2018, 03/03/2018 FINDINGS: Pleural effusions and bibasilar airspace disease. Esophageal tube is looped back upon itself with the tip positioned near the GE junction. Gaseous enlargement of small bowel up to 3 cm. Vascular calcifications. IMPRESSION: 1. Esophageal tube is looped upon itself in the stomach with the tip projecting near the GE junction and directed cephalad. 2. Dilated loops of small bowel within the central abdomen and pelvis 3. Pleural effusions and basilar consolidations. Electronically Signed   By: Donavan Foil M.D.   On: 03/05/2018 00:46   Dg Abd 1 View  Result Date: 03/03/2018 CLINICAL DATA:  NG tube placement. EXAM: ABDOMEN - 1 VIEW COMPARISON:  CT abdomen pelvis dated March 01, 2018. FINDINGS: NG tube within the stomach. Mildly dilated loops of air-filled small bowel in the central abdomen. No pneumoperitoneum. No acute osseous abnormality. IMPRESSION: 1. NG tube within the stomach. 2. Unchanged small bowel ileus/obstruction. Electronically Signed   By: Titus Dubin M.D.   On: 03/03/2018 15:59   Dg Chest Port 1 View  Result Date: 03/08/2018 CLINICAL DATA:  Shortness of breath EXAM: PORTABLE CHEST 1 VIEW COMPARISON:  03/06/2018 FINDINGS: Cardiomegaly. Small bilateral pleural effusions. No  frank interstitial edema. No pneumothorax. Right arm PICC terminates cavoatrial junction. Enteric tube terminates in the proximal stomach. IMPRESSION: Cardiomegaly with small bilateral pleural effusions. No frank interstitial edema. Electronically Signed   By: Julian Hy M.D.   On: 03/08/2018 02:19   Dg Chest Port 1 View  Result Date: 03/03/2018 CLINICAL DATA:  Fever and  hypoxia. EXAM: PORTABLE CHEST 1 VIEW COMPARISON:  Chest x-ray dated October 30, 2017. FINDINGS: The patient is rotated to the right. NG tube within the stomach. Stable cardiomegaly. Normal pulmonary vascularity. Atherosclerotic calcification of the aortic arch. Small bilateral pleural effusions with bibasilar opacities. No pneumothorax. No acute osseous abnormality. IMPRESSION: 1. Small bilateral pleural effusions with bibasilar opacities, favoring atelectasis. Electronically Signed   By: Titus Dubin M.D.   On: 03/03/2018 16:01   Dg Abd 2 Views  Result Date: 03/06/2018 CLINICAL DATA:  Abdominal pain. EXAM: ABDOMEN - 2 VIEW COMPARISON:  03/05/2018 radiographs and prior studies FINDINGS: An NG tube coiled within the stomach again noted. Decreased mild gaseous distension of a few small bowel loops noted. No new findings are identified. IMPRESSION: Decreased mild gaseous distension of a few small bowel loops. NG tube again noted. Electronically Signed   By: Margarette Canada M.D.   On: 03/06/2018 14:51   Dg Abd Portable 1v  Result Date: 03/04/2018 CLINICAL DATA:  Small bowel obstruction. EXAM: PORTABLE ABDOMEN - 1 VIEW COMPARISON:  Radiograph of March 03, 2018. FINDINGS: Nasogastric tube is seen looped within the stomach with distal tip in proximal stomach. Improved mild small bowel dilatation is noted concerning for distal small bowel obstruction. Vascular calcifications are noted. IMPRESSION: Nasogastric tube tip seen within proximal stomach. Decreased small bowel dilatation is noted suggesting improving distal small bowel obstruction or ileus. Electronically Signed   By: Marijo Conception, M.D.   On: 03/04/2018 20:53   [place] Ekg Site Rite  Result Date: 03/05/2018 If Site Rite image not attached, placement could not be confirmed due to current cardiac rhythm.  Ct Angio Abd/pel W And/or Wo Contrast  Result Date: 03/02/2018 CLINICAL DATA:  Generalized abdominal pain EXAM: CTA ABDOMEN AND PELVIS wITHOUT AND WITH  CONTRAST TECHNIQUE: Multidetector CT imaging of the abdomen and pelvis was performed using the standard protocol during bolus administration of intravenous contrast. Multiplanar reconstructed images and MIPs were obtained and reviewed to evaluate the vascular anatomy. CONTRAST:  20mL ISOVUE-370 IOPAMIDOL (ISOVUE-370) INJECTION 76% COMPARISON:  CT 06/26/2017 FINDINGS: VASCULAR Aorta: Moderate severe aortic atherosclerosis. No aneurysm or dissection. Celiac: Heavily calcified at the origin suspected moderate stenosis at the origin. SMA: Moderate calcification at the origin. Suspected moderate stenosis at the origin of the SMA. Distal vascular patency. Renals: Single right and single left renal arteries with dense calcification at the origins. Mild to moderate stenosis suspected at the origin of right renal artery. Suspected moderate stenosis at the origin of the left renal artery. IMA: Calcified at the origin.  Distal patency. Inflow: Moderate severe aortic and common iliac disease without occlusion or high-grade focal stenosis. Moderate disease of the internal iliac arteries. No significant stenosis of the external iliac vessels. Proximal Outflow: No significant stenosis at the common femoral or imaged portions of the superficial femoral and profunda arteries. Moderate aortic atherosclerosis. Review of the MIP images confirms the above findings. NON-VASCULAR Lower chest: Lung bases demonstrate no acute consolidation or effusion. There is cardiomegaly. Hepatobiliary: Subcentimeter hypodense liver lesions too small to characterize. Stones in the gallbladder. No biliary enlargement Pancreas: Unremarkable. No pancreatic ductal dilatation or  surrounding inflammatory changes. Spleen: Normal in size without focal abnormality. Adrenals/Urinary Tract: Adrenal glands are within normal limits. No hydronephrosis. Subcentimeter cortical hypodense renal lesions too small to further characterize. Bladder unremarkable Stomach/Bowel:  Stomach is nonenlarged. Fluid-filled borderline enlarged loops of small bowel, mostly within the pelvis. No significant bowel wall thickening. Poorly identified appendix. Possible fluid-filled bowel in the right groin, no evidence for incarceration. Lymphatic: No significantly enlarged lymph nodes. Reproductive: No adnexal mass. Other: No free air.  Small amount of free fluid in the pelvis. Musculoskeletal: Scoliosis and degenerative changes of the spine. No acute or suspicious abnormality. Diffuse anasarca. IMPRESSION: VASCULAR Negative for aortic dissection or acute aortic occlusive disease. Moderate severe aortic atherosclerosis with stenosis of branch vessels in the abdomen and pelvis as detailed above. NON-VASCULAR 1. Borderline fluid-filled enlarged loops distal small bowel primarily within the lower abdomen and pelvis without well-defined transition point. Findings could be secondary to ileus or low-grade obstruction. No free air. 2. Gallstones 3. Small amount of free fluid in the pelvis 4. Cardiomegaly Electronically Signed   By: Donavan Foil M.D.   On: 03/02/2018 00:59   US Abdomen Limited Ruq  Result Date: 03/02/2018 CLINICAL DATA:  Generalized abdominal pain with nausea EXAM: ULTRASOUND ABDOMEN LIMITED RIGHT UPPER QUADRANT COMPARISON:  CT 03/01/2018 FINDINGS: Gallbladder: Small amount of echogenic sludge. Negative sonographic Murphy. Small stones in the gallbladder. Normal wall thickness. Common bile duct: Diameter: 2.9 mm Liver: Hepatic echogenicity within normal limits. Small cysts within the liver measuring up to 1.1 cm. Portal vein is patent on color Doppler imaging with normal direction of blood flow towards the liver. Tiny amount of fluid adjacent to the liver. Tiny amount of right perinephric fluid. IMPRESSION: 1. Small stones and sludge in the gallbladder. No evidence for acute cholecystitis or biliary dilatation. 2. Trace amount of fluid adjacent to the liver and right kidney 3. Small cysts  within the liver Electronically Signed   By: Donavan Foil M.D.   On: 03/02/2018 03:32    Consults: Treatment Team:  Arta Silence, MD Benjamine Sprague, DO Saundra Shelling, MD   Subjective:    Overnight Issues:  Patient is status post laparoscopy, lysis of adhesions, open femoral hernia repair of incarcerated hernia. Has been weaned successfully off of nicardipine last night  Objective:  Vital signs for last 24 hours: Temp:  [98.3 F (36.8 C)-98.8 F (37.1 C)] 98.8 F (37.1 C) (07/31 0200) Pulse Rate:  [71-93] 73 (07/31 0700) Resp:  [12-32] 28 (07/31 0700) BP: (118-176)/(44-74) 164/56 (07/31 0700) SpO2:  [94 %-100 %] 100 % (07/31 0700) Weight:  [112 lb 3.4 oz (50.9 kg)] 112 lb 3.4 oz (50.9 kg) (07/31 0351)  Hemodynamic parameters for last 24 hours:    Intake/Output from previous day: 07/30 0701 - 07/31 0700 In: 955.8 [I.V.:940.8; IV Piggyback:15] Out: 1100 [Emesis/NG output:1100]  Intake/Output this shift: No intake/output data recorded.  Vent settings for last 24 hours:    Physical Exam:  General:NAD Neuro:No focal deficits HEENT:NCAT, sclerae white, oropharynx normal Cardiovascular:Regular, no M Lungs:Clear to auscultation and percussion Abdomen:ypoactive bowel sounds, soft exam Extremities: Warm, no edema  Assessment/Plan:   Hypertensive emergency. Patient has been successfully weaned off of nicardipine post surgery. We'll continue scheduled hydralazine and Lopressor and nitropaiste until patient is able to take oral medication and can transition to antihypertensives  Postop day 1, laparoscopy with lysis of adhesions and open femoral hernia repair for incarcerated right hernia  Protein calorie malnutrition. Started on TPN  Prerenal azotemia. Slight worsening  of BUN/creatinine, will watch closely, support hemodynamics, avoid nephrotoxic agents  Anemia. No evidence of active bleeding  Hyperglycemia. On sliding scale coverage  Critical Care  Total Time 35 minutes  Tlaloc Taddei 03/10/2018  *Care during the described time interval was provided by me and/or other providers on the critical care team.  I have reviewed this patient's available data, including medical history, events of note, physical examination and test results as part of my evaluation. Patient ID: TELITHA PLATH, female   DOB: 1917-12-09, 84 y.o.   MRN: [id number] Patient ID: TERILYNN BURESH, female   DOB: [birth year], 84 y.o.   MRN: [id number] Patient ID: CIERA BECKUM, female   DOB: 1918-05-12, 84 y.o.   MRN: [id number]

## 2018-03-10 NOTE — Progress Notes (Signed)
No residual noted from NG after waiting period. Removed per orders. Patient continues to sit in chair with no distress noted. Will continue to monitor. Family at bedside.

## 2018-03-10 NOTE — Progress Notes (Signed)
PHARMACY - ADULT TOTAL PARENTERAL NUTRITION CONSULT NOTE   Pharmacy Consult for TPN Indication: bowel obstruction  Patient Measurements: Height: 4\' 11"  (149.9 cm) Weight: 112 lb 3.4 oz (50.9 kg) IBW/kg (Calculated) : 43.2 TPN AdjBW (KG): 46.3 Body mass index is 22.66 kg/m.   Assessment: 84 yo female with SBO starting on TPN  Endo: Pt with hx of DM Insulin requirements in the past 24 hours: 17units Lytes: K 4.8, Mag 2.3, Phos 5.4 - no supplementation at this time Renal: SCr 1.42, CrCl 15.1 ml/min   TPN Access: Order to place PICC 7/26 TPN start date: To be started 7/26 Nutritional Goals (per RD recommendation on 7/26):  Estimated Nutritional Needs:  Kcal:  1100-1300kcal/day  Protein:  60-70g/day Fluid:  >1.1L/day  Goal TPN rate is 50 ml/hr (provides 1212kcal/day, 60g/day protein, 137ml volume)  Current Nutrition: NPO  Plan:  Will change to Climinix 5/15 without electrolytes at goal rate of 49mL/hr, MVI and trace added to TPN.   Electrolytes are within range with the exception of Phosphorus which is continuing to trend up. Will remove electrolytes from TPN. Will follow up on AM labs and assess need to supplement outside of TPN.  Will continue moderate scale SSI Q4hr. Will hold off adding Insulin to TPN since patient had a hypoglycemic episode yesterday. Will consider adding insulin to TPN on 8/01 per diabetes team recommendations.   Monitor TPN labs, BG  Will obtain follow up electrolytes with am labs.   Pharmacy will continue to monitor and adjust per consult.    Paulina Fusi, PharmD, BCPS 03/10/2018 11:23 AM

## 2018-03-10 NOTE — Progress Notes (Signed)
Nutrition Follow-up  DOCUMENTATION CODES:   Severe malnutrition in context of chronic illness  INTERVENTION:  Plan is to change patient to Clinimix without electrolytes today in setting of hyperphosphatemia.  Provide Clinimix 5/15 without electrolytes at 50 mL/hr + 20% ILE at 15 mL/hr over 12 hours. Goal regimen provides 1212 kcal, 60 grams of protein, 1200 mL fluid daily (+180 mL from lipids).  Assess electrolytes daily for plan on providing Clinimix with electrolytes or without electrolytes.  Continue adult MVI and trace elements as daily TPN additives.  Continue daily weights.  NUTRITION DIAGNOSIS:   Severe Malnutrition related to chronic illness(DM, advanced age) as evidenced by severe fat depletion, severe muscle depletion.  Ongoing - addressing with TPN.  GOAL:   Patient will meet greater than or equal to 90% of their needs  Met with TPN.  MONITOR:   PO intake, Supplement acceptance, Labs, Weight trends, Skin, I & O's  REASON FOR ASSESSMENT:   Consult New TPN/TNA  ASSESSMENT:   84 y/o female with h/o DM, HTN admitted with SBO   -Patient transferred to ICU on 7/27 for hypertensive emergency. -Patient s/p diagnostic laparoscopy, lysis of adhesions, and open femoral hernia repair with mesh on 7/30.   Met with patient at bedside. She was on 2 L/min nasal cannula and reports she is feeling well. She reports her previous abdominal pain (from SBO) is much better. She now just has some soreness at laparoscopic port sites.   IV Access: right brachial PICC double lumen placed 7/26; ECG used to confirm PICC is in SVC  TPN: pt tolerating Clinimix E 5/15 at 40 mL/hr + 20% ILE at 15 mL/hr over 12 hours; phosphorus has been trending up  NGT has had minimal output. Surgery clamped tube and plan is to assess later if it can be removed.  Medications reviewed and include: hydralazine, Novolog 0-15 units Q4hrs (received 21 units past 24 hrs), levothyroxine, metoprolol,  nitroglycerin, famotidine.  Labs reviewed: CBG 63-252 past 24 hrs (TPN was possibly paused during surgery), BUN 62 (trending up), Creatinine 1.42 (trending up), Phosphorus 5.4 (trending up). Triglycerides 112 on 7/29.  I/O: 1100 mL output from NGT yesterday; no documented UOP past 24 hrs; per RN pt had 200 mL output from [place] shortly before rounds started  Weight trend: 50.9 kg on 7/31; +4.6 kg from 7/23  Discussed with RN and on rounds.  Diet Order:   Diet Order           Diet NPO time specified Except for: Sips with Meds, Ice Chips  Diet effective now          EDUCATION NEEDS:   Education needs have been addressed  Skin:  Skin Assessment: Reviewed RN Assessment  Last BM:  PTA  Height:   Ht Readings from Last 1 Encounters:  03/02/18 4' 11" (1.499 m)    Weight:   Wt Readings from Last 1 Encounters:  03/10/18 112 lb 3.4 oz (50.9 kg)    Ideal Body Weight:  44.5 kg  BMI:  Body mass index is 22.66 kg/m.  Estimated Nutritional Needs:   Kcal:  1100-1300kcal/day   Protein:  60-70g/day  Fluid:  >1.1L/day   Willey Blade, MS, RD, LDN Office: [phone] Pager: [phone] After Hours/Weekend Pager: [phone]

## 2018-03-10 NOTE — Progress Notes (Signed)
Physical Therapy Treatment Patient Details Name: Tara [name] MRN: [id number] DOB: [birth year] Today's Date: 03/10/2018    History of Present Illness Pt is a 84 year old female with history of diabetes who is admitted due to abdominal pain with NG tube placed. Assessment includes: SBO versus ileus, accelerated HTN, hypothyroidism, and DM. Pt was transferred to the floor on 03/04/18 but was noted to have hypertensive emergencywhich resulted in pt being admitted back to the ICU on 03/06/18.    PT Comments    Pt agreeable to PT and eager to sit in chair. Denies pain; complains of weakness in BLEs. Pt requires Min A to edge of bed. Min of 2 for STS transfers and short ambulation with additional AD bed to chair. Pt participates in brief low level exercises in chair and encouraged to perform throughout the day for improved LE strengthening. Pt pleased in chair. Respiration increased to 30/min on room air with O2 saturation 95-97%; nursing aware and will recheck pt. Continue PT to progress strength and endurance to improve overall functional mobility.   Follow Up Recommendations  SNF;Supervision for mobility/OOB     Equipment Recommendations  None recommended by PT    Recommendations for Other Services       Precautions / Restrictions Precautions Precautions: Fall Restrictions Weight Bearing Restrictions: No    Mobility  Bed Mobility Overal bed mobility: Needs Assistance Bed Mobility: Supine to Sit     Supine to sit: Min assist     General bed mobility comments: Increased time and assist primarily for trunk  Transfers Overall transfer level: Needs assistance Equipment used: None Transfers: Sit to/from Stand Sit to Stand: Min assist;+2 physical assistance         General transfer comment: Weakness in LEs, but good ability to maintain stand without overt buckling  Ambulation/Gait Ambulation/Gait assistance: Min assist;+2 physical assistance Gait Distance (Feet): 3  Feet Assistive device: None Gait Pattern/deviations: Step-to pattern Gait velocity: decreased   General Gait Details: small steps bed to chair. Hook arm assist of 2   Stairs             Wheelchair Mobility    Modified Rankin (Stroke Patients Only)       Balance Overall balance assessment: Needs assistance Sitting-balance support: Bilateral upper extremity supported;Feet supported Sitting balance-Leahy Scale: Fair     Standing balance support: Bilateral upper extremity supported Standing balance-Leahy Scale: Poor                              Cognition Arousal/Alertness: Awake/alert Behavior During Therapy: WFL for tasks assessed/performed Overall Cognitive Status: Within Functional Limits for tasks assessed                                        Exercises General Exercises - Lower Extremity Ankle Circles/Pumps: AROM;Both;15 reps Quad Sets: Strengthening;Both;15 reps Gluteal Sets: Strengthening;Both;15 reps Long Arc Quad: AROM;Both;10 reps    General Comments        Pertinent Vitals/Pain Pain Assessment: No/denies pain    Home Living                      Prior Function            PT Goals (current goals can now be found in the care plan section) Progress towards PT goals: Progressing toward goals  Frequency    Min 2X/week      PT Plan Current plan remains appropriate    Co-evaluation              AM-PAC PT "6 Clicks" Daily Activity  Outcome Measure  Difficulty turning over in bed (including adjusting bedclothes, sheets and blankets)?: Unable Difficulty moving from lying on back to sitting on the side of the bed? : Unable Difficulty sitting down on and standing up from a chair with arms (e.g., wheelchair, bedside commode, etc,.)?: Unable Help needed moving to and from a bed to chair (including a wheelchair)?: A Lot Help needed walking in hospital room?: A Lot Help needed climbing 3-5 steps  with a railing? : Total 6 Click Score: 8    End of Session Equipment Utilized During Treatment: Gait belt Activity Tolerance: Patient tolerated treatment well Patient left: in chair;with call bell/phone within reach;with nursing/sitter in room;with family/visitor present   PT Visit Diagnosis: Unsteadiness on feet (R26.81);Muscle weakness (generalized) (M62.81);Difficulty in walking, not elsewhere classified (R26.2)     Time: 1203-1226 PT Time Calculation (min) (ACUTE ONLY): 23 min  Charges:  $Gait Training: 8-22 mins $Therapeutic Exercise: 8-22 mins                      Larae Grooms, PTA 03/10/2018, 1:09 PM

## 2018-03-11 ENCOUNTER — Inpatient Hospital Stay: Payer: Medicare Other

## 2018-03-11 LAB — URINALYSIS, ROUTINE W REFLEX MICROSCOPIC
BACTERIA UA: NONE SEEN
Bilirubin Urine: NEGATIVE
Glucose, UA: NEGATIVE mg/dL
HGB URINE DIPSTICK: NEGATIVE
Ketones, ur: NEGATIVE mg/dL
LEUKOCYTES UA: NEGATIVE
Nitrite: NEGATIVE
PROTEIN: 100 mg/dL — AB
Specific Gravity, Urine: 1.012 (ref 1.005–1.030)
pH: 5 (ref 5.0–8.0)

## 2018-03-11 LAB — COMPREHENSIVE METABOLIC PANEL
ALK PHOS: 46 U/L (ref 38–126)
ALT: 9 U/L (ref 0–44)
AST: 17 U/L (ref 15–41)
Albumin: 1.7 g/dL — ABNORMAL LOW (ref 3.5–5.0)
Anion gap: 7 (ref 5–15)
BUN: 71 mg/dL — ABNORMAL HIGH (ref 8–23)
CALCIUM: 7.2 mg/dL — AB (ref 8.9–10.3)
CHLORIDE: 107 mmol/L (ref 98–111)
CO2: 26 mmol/L (ref 22–32)
Creatinine, Ser: 1.42 mg/dL — ABNORMAL HIGH (ref 0.44–1.00)
GFR calc Af Amer: 34 mL/min — ABNORMAL LOW (ref >60)
GFR calc non Af Amer: 30 mL/min — ABNORMAL LOW (ref >60)
Glucose, Bld: 202 mg/dL — ABNORMAL HIGH (ref 70–99)
Potassium: 4.4 mmol/L (ref 3.5–5.1)
SODIUM: 140 mmol/L (ref 135–145)
Total Bilirubin: 0.3 mg/dL (ref 0.3–1.2)
Total Protein: 4.9 g/dL — ABNORMAL LOW (ref 6.5–8.1)

## 2018-03-11 LAB — CBC WITH DIFFERENTIAL/PLATELET
Basophils Absolute: 0 10*3/uL (ref 0–0.1)
Basophils Relative: 0 %
EOS PCT: 2 %
Eosinophils Absolute: 0.2 10*3/uL (ref 0–0.7)
HEMATOCRIT: 25.2 % — AB (ref 35.0–47.0)
Hemoglobin: 8.6 g/dL — ABNORMAL LOW (ref 12.0–16.0)
LYMPHS ABS: 1.6 10*3/uL (ref 1.0–3.6)
LYMPHS PCT: 13 %
MCH: 31.5 pg (ref 26.0–34.0)
MCHC: 34.2 g/dL (ref 32.0–36.0)
MCV: 92.1 fL (ref 80.0–100.0)
Monocytes Absolute: 0.8 10*3/uL (ref 0.2–0.9)
Monocytes Relative: 7 %
NEUTROS ABS: 9.9 10*3/uL — AB (ref 1.4–6.5)
Neutrophils Relative %: 78 %
PLATELETS: 296 10*3/uL (ref 150–440)
RBC: 2.74 MIL/uL — AB (ref 3.80–5.20)
RDW: 15.1 % — ABNORMAL HIGH (ref 11.5–14.5)
WBC: 12.6 10*3/uL — ABNORMAL HIGH (ref 3.6–11.0)

## 2018-03-11 LAB — PHOSPHORUS: Phosphorus: 4 mg/dL (ref 2.5–4.6)

## 2018-03-11 LAB — GLUCOSE, CAPILLARY
GLUCOSE-CAPILLARY: 148 mg/dL — AB (ref 70–99)
GLUCOSE-CAPILLARY: 119 mg/dL — AB (ref 70–99)
Glucose-Capillary: 196 mg/dL — ABNORMAL HIGH (ref 70–99)
Glucose-Capillary: 222 mg/dL — ABNORMAL HIGH (ref 70–99)
Glucose-Capillary: 260 mg/dL — ABNORMAL HIGH (ref 70–99)

## 2018-03-11 LAB — MAGNESIUM: MAGNESIUM: 2.5 mg/dL — AB (ref 1.7–2.4)

## 2018-03-11 MED ORDER — GUAIFENESIN-DM 100-10 MG/5ML PO SYRP
5.0000 mL | ORAL_SOLUTION | ORAL | Status: DC | PRN
  Administered 2018-03-11 – 2018-03-14 (×4): 5 mL via ORAL
  Filled 2018-03-11 (×5): qty 5

## 2018-03-11 MED ORDER — FUROSEMIDE 10 MG/ML IJ SOLN
40.0000 mg | Freq: Once | INTRAMUSCULAR | Status: AC
  Administered 2018-03-11: 40 mg via INTRAVENOUS
  Filled 2018-03-11: qty 4

## 2018-03-11 MED ORDER — METOPROLOL TARTRATE 5 MG/5ML IV SOLN
5.0000 mg | Freq: Once | INTRAVENOUS | Status: AC
  Administered 2018-03-11: 5 mg via INTRAVENOUS
  Filled 2018-03-11: qty 5

## 2018-03-11 MED ORDER — FAT EMULSION PLANT BASED 20 % IV EMUL
250.0000 mL | INTRAVENOUS | Status: DC
  Administered 2018-03-11: 250 mL via INTRAVENOUS
  Filled 2018-03-11: qty 250

## 2018-03-11 MED ORDER — TRACE MINERALS CR-CU-MN-SE-ZN 10-1000-500-60 MCG/ML IV SOLN
INTRAVENOUS | Status: DC
  Administered 2018-03-11: 19:00:00 via INTRAVENOUS
  Filled 2018-03-11: qty 1200

## 2018-03-11 NOTE — Clinical Social Work Note (Signed)
Patient has transferred out from ICU at this time to the medical floor. CSW has requested a new PT evaluation be placed and has re-initiated bed search. CSW has spoken to both patient and daughter this morning and they are in agreement but patient is hopeful that she will improve enough to be able to go home at discharge. Shela Leff MSW,LCSW [phone]

## 2018-03-11 NOTE — Care Management Important Message (Addendum)
Copy of signed IM left with patient's family in room.

## 2018-03-11 NOTE — Progress Notes (Signed)
   03/11/18 1035  Clinical Encounter Type  Visited With Patient and family together  Visit Type Follow-up  Spiritual Encounters  Spiritual Needs Prayer   Chaplain checked in with patient and daughter.  Patient was trying to sleep, but daughter asked for chaplain to pray with them before leaving.  Chaplain encouraged family to call for chaplain as needed.

## 2018-03-11 NOTE — Progress Notes (Signed)
[facility] [place] NAME: Tara [name]    MR#:  [id number]  DATE OF BIRTH:  08/17/1917  SUBJECTIVE:  Patient seen and evaluated today Off NG tube Transferred to medical floor today Off IV nicardipine drip Decreased abdominal pain On clear liquid diet   REVIEW OF SYSTEMS:    Review of Systems  Constitutional: No fever HENT: Negative for ear pain, nosebleeds, congestion, facial swelling, rhinorrhea, neck pain, neck stiffness and ear discharge.   Respiratory: Negative for cough, shortness of breath, wheezing  Cardiovascular: Negative for chest pain, palpitations and leg swelling.  Gastrointestinal: Negative for heartburn,mild  abdominal pain present ,no vomiting, diarrhea or consitpation Genitourinary: Negative for dysuria, urgency, frequency, hematuria Musculoskeletal: Negative for back pain or joint pain Neurological: Negative for dizziness, seizures, syncope, focal weakness,  numbness and headaches.  Hematological: Does not bruise/bleed easily.  Psychiatric/Behavioral: Negative for hallucinations, confusion, dysphoric mood   DRUG ALLERGIES:   Allergies  Allergen Reactions  . Ace Inhibitors Other (See Comments)    Angioedema  . Hydrochlorothiazide W-Triamterene Other (See Comments)    Gout flare  . Lisinopril Swelling and Other (See Comments)  . Penicillins Swelling  . Trichlormethiazide Other (See Comments)    Gout flare    VITALS:  Blood pressure (!) 176/65, pulse 74, temperature 98.2 F (36.8 C), temperature source Oral, resp. rate 20, height 4\' 11"  (1.499 m), weight 103 lb 2.8 oz (46.8 kg), SpO2 98 %.  PHYSICAL EXAMINATION:  Constitutional: Appears thin and frail and has NG tube placed no distress. HENT: Normocephalic. Marland Kitchen Oropharynx is clear and moist.  Eyes: Conjunctivae and EOM are normal. PERRLA, no scleral icterus.  Neck: Normal ROM. Neck supple. No JVD. No tracheal deviation. CVS: RRR, S1/S2 +, no murmurs, no  gallops, no carotid bruit.  Pulmonary: Effort and breath sounds normal, no stridor, rhonchi, wheezes, rales.  Abdominal: soft, non distended, lower abdominal bandage noted Mild tenderness Musculoskeletal: Normal range of motion. No edema and no tenderness.  Neuro: Alert. CN 2-12 grossly intact. No focal deficits. Skin: Skin is warm and dry. No rash noted. Psychiatric: Normal mood and affect.      LABORATORY PANEL:   CBC Recent Labs  Lab 03/11/18 0328  WBC 12.6*  HGB 8.6*  HCT 25.2*  PLT 296   ------------------------------------------------------------------------------------------------------------------  Chemistries  Recent Labs  Lab 03/11/18 0328  NA 140  K 4.4  CL 107  CO2 26  GLUCOSE 202*  BUN 71*  CREATININE 1.42*  CALCIUM 7.2*  MG 2.5*  AST 17  ALT 9  ALKPHOS 46  BILITOT 0.3   ------------------------------------------------------------------------------------------------------------------  Cardiac Enzymes No results for input(s): TROPONINI in the last 168 hours. ------------------------------------------------------------------------------------------------------------------  RADIOLOGY:  Dg Chest 1 View  Result Date: 03/11/2018 CLINICAL DATA:  Worsening cough. EXAM: CHEST  1 VIEW COMPARISON:  03/08/2018. FINDINGS: Right PICC line noted with tip over superior vena cava. Cardiomegaly with pulmonary venous congestion, bilateral pulmonary infiltrates/edema, bilateral pleural effusions. No interim change. IMPRESSION: 1.  Right PICC line noted with tip over superior vena cava. 2. Cardiomegaly with pulmonary venous congestion, bilateral pulmonary infiltrates/edema, bilateral pleural effusions. 3.  Bibasilar atelectasis. Electronically Signed   By: Marcello Moores  Register   On: 03/11/2018 09:29     ASSESSMENT AND PLAN:   84 year old female with history of diabetes who is admitted due to abdominal pain   1.  S/p laparotomy and lysis of abdominal adhesions and  incarcerated femoral hernia was taken care with femoral hernia repair  with mesh Surgery f/u   2.  Hypertensive urgency improved  off IV nicardipine drip  Continue scheduled IV metoprolol and hydralazine ,  Continue clonidine patch Switch to oral meds to control blood pressure once patient tolerates oral medication  3.Bilateral pleural effusions and pulmonary congestion Gentle Lasix IV for diuresis on a daily basis  4.  Hypothyroid: Continue Synthroid  5.  Protein calorie malnutrition On TPN currently   Physical therapy is recommending skilled nursing facility upon discharge.  Clinical social worker consultation placed.  Management plans discussed with the patient and daughter and she is in agreement.  CODE STATUS: full  TOTAL TIME TAKING CARE OF THIS PATIENT: 33 minutes.     POSSIBLE D/C 2-5 days DEPENDING ON CLINICAL CONDITION.   Saundra Shelling M.D on 03/11/2018 at 1:14 PM  Between 7am to 6pm - Pager - [phone] After 6pm go to [email or web address] - password [email or web address]  Office  [phone]  CC: Primary care physician; Einar Pheasant, MD  Note: This dictation was prepared with Dragon dictation along with smaller phrase technology. Any transcriptional errors that result from this process are unintentional.

## 2018-03-11 NOTE — Progress Notes (Signed)
Follow up - Critical Care Medicine Note  Patient Details:    Tara [name] is an 84 y.o. female.past medical history remarkable for hypertension, diabetes, asthma, who was admitted for nausea, vomiting and significant abdominal pain, found to have high-grade ileus versus small bowel obstruction. She was also noted to have hypertensive emergency and was admitted initially to the intensive care unit for a nicardipine infusion. She was subsequently transitioned to scheduled IV hydralazine and Lopressor. Was seen by surgery in consultation for SBO and opted for decompression versus exploratory laparotomy. Patient had worsening hypertension and unable to control with IV medications and will be subsequently transferred back to the intensive care unit for nicardipine infusion.     Lines, Airways, Drains: PICC Double Lumen 03/05/18 PICC Right Brachial 34 cm 0 cm (Active)  Indication for Insertion or Continuance of Line Administration of hyperosmolar/irritating solutions (i.e. TPN, Vancomycin, etc.) 03/07/2018  8:00 AM  Exposed Catheter (cm) 0 cm 03/05/2018  6:49 PM  Site Assessment Clean;Dry;Intact 03/07/2018  8:00 AM  Lumen #1 Status Infusing;Flushed;Blood return noted 03/07/2018  8:00 AM  Lumen #2 Status Infusing;Flushed;Blood return noted;Cap changed 03/07/2018  8:00 AM  Dressing Type Transparent;Occlusive 03/07/2018  8:00 AM  Dressing Status Clean;Dry;Antimicrobial disc in place 03/07/2018  8:00 AM  Line Care Connections checked and tightened 03/07/2018  8:00 AM  Dressing Intervention New dressing 03/05/2018  6:49 PM  Dressing Change Due 03/12/18 03/07/2018 12:00 AM     NG/OG Tube Nasogastric 14 Fr. Right nare (Active)  Site Assessment Clean;Intact;Dry 03/07/2018  8:00 AM  Ongoing Placement Verification No change in respiratory status 03/07/2018  8:00 AM  Status Suction-low intermittent 03/07/2018  8:00 AM  Drainage Appearance Brown 03/07/2018 12:25 AM  Intake (mL) 0 mL 03/06/2018  7:43 AM  Output (mL) 50  mL 03/06/2018  4:00 PM     External Urinary Catheter (Active)  Collection Container Dedicated Suction Canister 03/07/2018  8:00 AM  Output (mL) 100 mL 03/06/2018  7:53 PM    Anti-infectives:  Anti-infectives (From admission, onward)   None      Microbiology: Results for orders placed or performed during the hospital encounter of 03/01/18  MRSA PCR Screening     Status: None   Collection Time: 03/02/18  6:25 AM  Result Value Ref Range Status   MRSA by PCR NEGATIVE NEGATIVE Final    Comment:        The GeneXpert MRSA Assay (FDA approved for NASAL specimens only), is one component of a comprehensive MRSA colonization surveillance program. It is not intended to diagnose MRSA infection nor to guide or monitor treatment for MRSA infections. Performed at [facility], [address], [place], [place] [address]     Studies: Dg Chest 1 View  Result Date: 03/06/2018 CLINICAL DATA:  Congestive heart failure. EXAM: CHEST  1 VIEW COMPARISON:  One-view chest x-ray 03/05/2018 FINDINGS: Heart is enlarged. NG tube terminates in the stomach. A right-sided PICC line is stable. Interstitial edema and bilateral pleural effusions is stable. Bibasilar airspace disease likely reflects atelectasis. IMPRESSION: 1. Stable appearance of cardiomegaly with interstitial edema and bilateral effusions compatible with congestive heart failure. Electronically Signed   By: San Morelle M.D.   On: 03/06/2018 07:18   Dg Chest 1 View  Result Date: 03/05/2018 CLINICAL DATA:  Fever EXAM: CHEST  1 VIEW COMPARISON:  03/03/2018 FINDINGS: Cardiac enlargement with vascular congestion. Progressive vascular congestion and bilateral edema. Bibasilar airspace disease and small effusions left greater than right with mild progression from  the prior study. NG tube in the stomach IMPRESSION: Progression of vascular congestion and edema. Progressive atelectasis and effusion left greater than right. Electronically  Signed   By: Franchot Gallo M.D.   On: 03/05/2018 09:20   Dg Abd 1 View  Result Date: 03/09/2018 CLINICAL DATA:  Small bowel obstruction. Enteric contrast administered 8 hours prior. EXAM: ABDOMEN - 1 VIEW COMPARISON:  Abdominal radiograph yesterday. FINDINGS: Administered enteric contrast within the stomach and small bowel. No visualized enteric contrast in the colon. Enteric tube remains in place tip in the stomach. Scoliotic curvature of spine. There are vascular calcifications. IMPRESSION: Administered enteric contrast within the stomach and small bowel. No enteric contrast has reached the colon. Recommend 24 hour film post contrast administration (1555 hour on 03/09/2018). Electronically Signed   By: Jeb Levering M.D.   On: 03/09/2018 01:59   Dg Abd 1 View  Result Date: 03/07/2018 CLINICAL DATA:  Follow up small bowel obstruction EXAM: ABDOMEN - 1 VIEW COMPARISON:  03/06/2018 FINDINGS: Nasogastric catheter is noted coiled within the stomach. Scattered large and small bowel gas is noted. Some mildly prominent loops of small bowel are noted in the mid abdomen relatively stable from the prior exam. No free air is seen. Degenerative changes of lumbar spine are noted. IMPRESSION: Mild gaseous distension of a few small bowel loops within the mid abdomen. Electronically Signed   By: Inez Catalina M.D.   On: 03/07/2018 07:03   Dg Abd 1 View  Result Date: 03/05/2018 CLINICAL DATA:  Ileus EXAM: ABDOMEN - 1 VIEW COMPARISON:  03/04/2018 FINDINGS: Enteric tube is looped in the left upper quadrant consistent with location in the body of the stomach. Decreasing gaseous distention of small bowel since previous study. Left upper quadrant small bowel remains distended. Paucity of gas in the colon with stool in the right colon. No radiopaque stones. Degenerative changes in the lumbar spine with lumbar scoliosis convex towards the right. Vascular calcifications. Old fractures of the left superior and right inferior  pubic rami. IMPRESSION: Decreasing gaseous distention of small bowel since previous study. Enteric tube is unchanged in position. Electronically Signed   By: Lucienne Capers M.D.   On: 03/05/2018 04:58   Dg Abd 1 View  Result Date: 03/05/2018 CLINICAL DATA:  NG tube placement EXAM: ABDOMEN - 1 VIEW COMPARISON:  03/04/2018, 03/03/2018 FINDINGS: Pleural effusions and bibasilar airspace disease. Esophageal tube is looped back upon itself with the tip positioned near the GE junction. Gaseous enlargement of small bowel up to 3 cm. Vascular calcifications. IMPRESSION: 1. Esophageal tube is looped upon itself in the stomach with the tip projecting near the GE junction and directed cephalad. 2. Dilated loops of small bowel within the central abdomen and pelvis 3. Pleural effusions and basilar consolidations. Electronically Signed   By: Donavan Foil M.D.   On: 03/05/2018 00:46   Dg Abd 1 View  Result Date: 03/03/2018 CLINICAL DATA:  NG tube placement. EXAM: ABDOMEN - 1 VIEW COMPARISON:  CT abdomen pelvis dated March 01, 2018. FINDINGS: NG tube within the stomach. Mildly dilated loops of air-filled small bowel in the central abdomen. No pneumoperitoneum. No acute osseous abnormality. IMPRESSION: 1. NG tube within the stomach. 2. Unchanged small bowel ileus/obstruction. Electronically Signed   By: Titus Dubin M.D.   On: 03/03/2018 15:59   Dg Chest Port 1 View  Result Date: 03/08/2018 CLINICAL DATA:  Shortness of breath EXAM: PORTABLE CHEST 1 VIEW COMPARISON:  03/06/2018 FINDINGS: Cardiomegaly. Small bilateral pleural effusions. No  frank interstitial edema. No pneumothorax. Right arm PICC terminates cavoatrial junction. Enteric tube terminates in the proximal stomach. IMPRESSION: Cardiomegaly with small bilateral pleural effusions. No frank interstitial edema. Electronically Signed   By: Julian Hy M.D.   On: 03/08/2018 02:19   Dg Chest Port 1 View  Result Date: 03/03/2018 CLINICAL DATA:  Fever and  hypoxia. EXAM: PORTABLE CHEST 1 VIEW COMPARISON:  Chest x-ray dated October 30, 2017. FINDINGS: The patient is rotated to the right. NG tube within the stomach. Stable cardiomegaly. Normal pulmonary vascularity. Atherosclerotic calcification of the aortic arch. Small bilateral pleural effusions with bibasilar opacities. No pneumothorax. No acute osseous abnormality. IMPRESSION: 1. Small bilateral pleural effusions with bibasilar opacities, favoring atelectasis. Electronically Signed   By: Titus Dubin M.D.   On: 03/03/2018 16:01   Dg Abd 2 Views  Result Date: 03/06/2018 CLINICAL DATA:  Abdominal pain. EXAM: ABDOMEN - 2 VIEW COMPARISON:  03/05/2018 radiographs and prior studies FINDINGS: An NG tube coiled within the stomach again noted. Decreased mild gaseous distension of a few small bowel loops noted. No new findings are identified. IMPRESSION: Decreased mild gaseous distension of a few small bowel loops. NG tube again noted. Electronically Signed   By: Margarette Canada M.D.   On: 03/06/2018 14:51   Dg Abd Portable 1v  Result Date: 03/04/2018 CLINICAL DATA:  Small bowel obstruction. EXAM: PORTABLE ABDOMEN - 1 VIEW COMPARISON:  Radiograph of March 03, 2018. FINDINGS: Nasogastric tube is seen looped within the stomach with distal tip in proximal stomach. Improved mild small bowel dilatation is noted concerning for distal small bowel obstruction. Vascular calcifications are noted. IMPRESSION: Nasogastric tube tip seen within proximal stomach. Decreased small bowel dilatation is noted suggesting improving distal small bowel obstruction or ileus. Electronically Signed   By: Marijo Conception, M.D.   On: 03/04/2018 20:53   [place] Ekg Site Rite  Result Date: 03/05/2018 If Site Rite image not attached, placement could not be confirmed due to current cardiac rhythm.  Ct Angio Abd/pel W And/or Wo Contrast  Result Date: 03/02/2018 CLINICAL DATA:  Generalized abdominal pain EXAM: CTA ABDOMEN AND PELVIS wITHOUT AND WITH  CONTRAST TECHNIQUE: Multidetector CT imaging of the abdomen and pelvis was performed using the standard protocol during bolus administration of intravenous contrast. Multiplanar reconstructed images and MIPs were obtained and reviewed to evaluate the vascular anatomy. CONTRAST:  3mL ISOVUE-370 IOPAMIDOL (ISOVUE-370) INJECTION 76% COMPARISON:  CT 06/26/2017 FINDINGS: VASCULAR Aorta: Moderate severe aortic atherosclerosis. No aneurysm or dissection. Celiac: Heavily calcified at the origin suspected moderate stenosis at the origin. SMA: Moderate calcification at the origin. Suspected moderate stenosis at the origin of the SMA. Distal vascular patency. Renals: Single right and single left renal arteries with dense calcification at the origins. Mild to moderate stenosis suspected at the origin of right renal artery. Suspected moderate stenosis at the origin of the left renal artery. IMA: Calcified at the origin.  Distal patency. Inflow: Moderate severe aortic and common iliac disease without occlusion or high-grade focal stenosis. Moderate disease of the internal iliac arteries. No significant stenosis of the external iliac vessels. Proximal Outflow: No significant stenosis at the common femoral or imaged portions of the superficial femoral and profunda arteries. Moderate aortic atherosclerosis. Review of the MIP images confirms the above findings. NON-VASCULAR Lower chest: Lung bases demonstrate no acute consolidation or effusion. There is cardiomegaly. Hepatobiliary: Subcentimeter hypodense liver lesions too small to characterize. Stones in the gallbladder. No biliary enlargement Pancreas: Unremarkable. No pancreatic ductal dilatation or  surrounding inflammatory changes. Spleen: Normal in size without focal abnormality. Adrenals/Urinary Tract: Adrenal glands are within normal limits. No hydronephrosis. Subcentimeter cortical hypodense renal lesions too small to further characterize. Bladder unremarkable Stomach/Bowel:  Stomach is nonenlarged. Fluid-filled borderline enlarged loops of small bowel, mostly within the pelvis. No significant bowel wall thickening. Poorly identified appendix. Possible fluid-filled bowel in the right groin, no evidence for incarceration. Lymphatic: No significantly enlarged lymph nodes. Reproductive: No adnexal mass. Other: No free air.  Small amount of free fluid in the pelvis. Musculoskeletal: Scoliosis and degenerative changes of the spine. No acute or suspicious abnormality. Diffuse anasarca. IMPRESSION: VASCULAR Negative for aortic dissection or acute aortic occlusive disease. Moderate severe aortic atherosclerosis with stenosis of branch vessels in the abdomen and pelvis as detailed above. NON-VASCULAR 1. Borderline fluid-filled enlarged loops distal small bowel primarily within the lower abdomen and pelvis without well-defined transition point. Findings could be secondary to ileus or low-grade obstruction. No free air. 2. Gallstones 3. Small amount of free fluid in the pelvis 4. Cardiomegaly Electronically Signed   By: Donavan Foil M.D.   On: 03/02/2018 00:59   US Abdomen Limited Ruq  Result Date: 03/02/2018 CLINICAL DATA:  Generalized abdominal pain with nausea EXAM: ULTRASOUND ABDOMEN LIMITED RIGHT UPPER QUADRANT COMPARISON:  CT 03/01/2018 FINDINGS: Gallbladder: Small amount of echogenic sludge. Negative sonographic Murphy. Small stones in the gallbladder. Normal wall thickness. Common bile duct: Diameter: 2.9 mm Liver: Hepatic echogenicity within normal limits. Small cysts within the liver measuring up to 1.1 cm. Portal vein is patent on color Doppler imaging with normal direction of blood flow towards the liver. Tiny amount of fluid adjacent to the liver. Tiny amount of right perinephric fluid. IMPRESSION: 1. Small stones and sludge in the gallbladder. No evidence for acute cholecystitis or biliary dilatation. 2. Trace amount of fluid adjacent to the liver and right kidney 3. Small cysts  within the liver Electronically Signed   By: Donavan Foil M.D.   On: 03/02/2018 03:32    Consults: Treatment Team:  Arta Silence, MD Benjamine Sprague, DO Saundra Shelling, MD   Subjective:    Overnight Issues:  Patient is status post laparoscopy, lysis of adhesions, open femoral hernia repair of incarcerated hernia. Has been weaned successfully off of nicardipine last night  Objective:  Vital signs for last 24 hours: Temp:  [98.1 F (36.7 C)] 98.1 F (36.7 C) (07/31 1955) Pulse Rate:  [63-79] 74 (08/01 0600) Resp:  [11-31] 18 (08/01 0600) BP: (142-170)/(47-66) 151/47 (08/01 0600) SpO2:  [94 %-100 %] 95 % (08/01 0600) Weight:  [103 lb 2.8 oz (46.8 kg)] 103 lb 2.8 oz (46.8 kg) (08/01 0300)  Hemodynamic parameters for last 24 hours:    Intake/Output from previous day: 07/31 0701 - 08/01 0700 In: 1210.3 [I.V.:1210.3] Out: 450 [Urine:400; Emesis/NG output:50]  Intake/Output this shift: No intake/output data recorded.  Vent settings for last 24 hours:    Physical Exam:  General:NAD Neuro:No focal deficits HEENT:NCAT, sclerae white, oropharynx normal Cardiovascular:Regular, no M Lungs:Clear to auscultation and percussion Abdomen:ypoactive bowel sounds, soft exam Extremities: Warm, no edema  Assessment/Plan:   Hypertensive emergency. Patient has been successfully weaned off of nicardipine post surgery. We'll continue scheduled hydralazine and Lopressor and nitropaiste until patient is tolerating clear liquids then may be transitioned to oral antihypertensives  Postop day 2, laparoscopy with lysis of adhesions and open femoral hernia repair for incarcerated right hernia  Protein calorie malnutrition. Started back on diet today hopefully will be able to advance  Mild leukocytosis. No clear evidence of infection pending chest x-ray and urinalysis, will obtain blood cultures if patient becomes febrile  Prerenal azotemia. Slight worsening of BUN/creatinine, will  watch closely,71/1.42 Anemia. No evidence of active bleeding  Hyperglycemia. On sliding scale coverage   Tara [name] 03/11/2018  *Care during the described time interval was provided by me and/or other providers on the critical care team.  I have reviewed this patient's available data, including medical history, events of note, physical examination and test results as part of my evaluation. Patient ID: Tara [name], female   DOB: March 14, 1918, 84 y.o.   MRN: [id number] Patient ID: Tara [name], female   DOB: [birth year], 84 y.o.   MRN: [id number] Patient ID: Tara [name], female   DOB: 1918-07-18, 84 y.o.   MRN: [id number] Patient ID: Tara [name], female   DOB: 1918/03/26, 84 y.o.   MRN: [id number]

## 2018-03-11 NOTE — Progress Notes (Addendum)
PHARMACY - ADULT TOTAL PARENTERAL NUTRITION CONSULT NOTE   Pharmacy Consult for TPN Indication: bowel obstruction  Patient Measurements: Height: 4\' 11"  (149.9 cm) Weight: 103 lb 2.8 oz (46.8 kg) IBW/kg (Calculated) : 43.2 TPN AdjBW (KG): 46.3 Body mass index is 20.84 kg/m.   Assessment: 84 yo female with SBO starting on TPN  Endo: Pt with hx of DM Insulin requirements in the past 24 hours: 16 units Lytes: K 4.4, Mag 2.5, Phos 4.0 - no supplementation at this time Renal: SCr 1.42, CrCl 15.1 ml/min   TPN Access: Order to place PICC 7/26 TPN start date: To be started 7/26 Nutritional Goals (per RD recommendation on 7/26):  Estimated Nutritional Needs:  Kcal:  1100-1300kcal/day  Protein:  60-70g/day Fluid:  >1.1L/day  Goal TPN rate is 50 ml/hr (provides 1212kcal/day, 60g/day protein, 132ml volume)  Current Nutrition: NPO  Plan:  Continue Climinix 5/15 without electrolytes at goal rate of 72mL/hr, MVI and trace added to TPN. 20% ILE at 15 mL/hr over 12 hours.   Continue to monitor electrolytes daily while on TPN without electrolytes.   Will continue moderate scale SSI Q4hr.Will continue to follow diabetes team recommendations.   Monitor TPN labs, BG  Pharmacy will continue to monitor and adjust per consult.    MLS 03/11/2018 11:51 AM

## 2018-03-11 NOTE — Progress Notes (Signed)
Subjective:    Tara [name] is a 84 y.o. female  Hospital stay day 9, 2 Days Post-Op dx lap, open femoral hernia repair  States had some non-productive coughing last night.  No BM yet, but very comfortable.   Objective:      Temp:  [98.1 F (36.7 C)] 98.1 F (36.7 C) (07/31 1955) Pulse Rate:  [63-79] 74 (08/01 0600) Resp:  [11-31] 18 (08/01 0600) BP: (142-170)/(47-66) 151/47 (08/01 0600) SpO2:  [94 %-100 %] 95 % (08/01 0600) Weight:  [46.8 kg (103 lb 2.8 oz)] 46.8 kg (103 lb 2.8 oz) (08/01 0300)     Height: 4\' 11"  (149.9 cm) Weight: 46.8 kg (103 lb 2.8 oz) BMI (Calculated): 20.83   Intake/Output this shift:  No intake/output data recorded.       Constitutional :  alert, cooperative, appears stated age and no distress  Respiratory:  clear to auscultation bilaterally  Cardiovascular:  regular rate and rhythm, S1, S2 normal, no murmur, click, rub or gallop  Gastrointestinal: soft, non-tender; bowel sounds normal; no masses,  no organomegaly and right groin incision c/d/i.  Slight swelling compared to yesterday, likely seroma, hematoma.  minimally tender to palpation.  no discharge or erythema on skin.   Skin: Cool and moist.   Psychiatric: Normal affect, non-agitated, not confused       LABS:  CMP Latest Ref Rng & Units 03/11/2018 03/10/2018 03/09/2018  Glucose 70 - 99 mg/dL 202(H) 227(H) 202(H)  BUN 8 - 23 mg/dL 71(H) 62(H) 51(H)  Creatinine 0.44 - 1.00 mg/dL 1.42(H) 1.42(H) 1.11(H)  Sodium 135 - 145 mmol/L 140 139 138  Potassium 3.5 - 5.1 mmol/L 4.4 4.8 4.4  Chloride 98 - 111 mmol/L 107 107 104  CO2 22 - 32 mmol/L 26 27 26   Calcium 8.9 - 10.3 mg/dL 7.2(L) 7.3(L) 7.8(L)  Total Protein 6.5 - 8.1 g/dL 4.9(L) - -  Total Bilirubin 0.3 - 1.2 mg/dL 0.3 - -  Alkaline Phos 38 - 126 U/L 46 - -  AST 15 - 41 U/L 17 - -  ALT 0 - 44 U/L 9 - -   CBC Latest Ref Rng & Units 03/11/2018 03/10/2018 03/08/2018  WBC 3.6 - 11.0 K/uL 12.6(H) 11.6(H) 10.2  Hemoglobin 12.0 - 16.0 g/dL 8.6(L)  8.8(L) 11.5(L)  Hematocrit 35.0 - 47.0 % 25.2(L) 27.1(L) 33.2(L)  Platelets 150 - 440 K/uL 296 275 302    RADS: n/a Assessment:   S/p dx lap, open femoral hernia repair with mesh for incarcerated bowel.  NG removed yesterday afternoon.  abd exam remains soft.  Advance to clears.  Leukocytosis noted increasing.  Will get CXR and U/A to start.  Wound infection unlikely this early  Anemia, stable.  Likely from surgery.  Will continue to monitor.  Continue TPN until tolerating reg diet.

## 2018-03-11 NOTE — Progress Notes (Signed)
Patient transferred to room 210. Report called to [facility] [place]. All of patient's belongings sent with patient. Daughter at bedside and stated she would alert the other family members. Patient's blood pressure controlled with scheduled medications, prn medications, and a one time dose of metoprolol IV. Tolerated small amount of clear liquids this morning. Up to chair for two hours this morning and up to bedside commode twice to void. Patient did report passing gas on second trip to [facility]. Patient only able to reach 34mL consistently on incentive spirometer.

## 2018-03-12 ENCOUNTER — Encounter
Admission: RE | Admit: 2018-03-12 | Discharge: 2018-03-12 | Disposition: A | Discharge status: Home / Self Care | Payer: Medicare Other | Source: Ambulatory Visit | Attending: Internal Medicine | Admitting: Internal Medicine

## 2018-03-12 LAB — BASIC METABOLIC PANEL
Anion gap: 5 (ref 5–15)
BUN: 66 mg/dL — AB (ref 8–23)
CALCIUM: 7.2 mg/dL — AB (ref 8.9–10.3)
CHLORIDE: 108 mmol/L (ref 98–111)
CO2: 25 mmol/L (ref 22–32)
CREATININE: 1.22 mg/dL — AB (ref 0.44–1.00)
GFR calc Af Amer: 41 mL/min — ABNORMAL LOW (ref >60)
GFR calc non Af Amer: 36 mL/min — ABNORMAL LOW (ref >60)
Glucose, Bld: 179 mg/dL — ABNORMAL HIGH (ref 70–99)
Potassium: 3.8 mmol/L (ref 3.5–5.1)
SODIUM: 138 mmol/L (ref 135–145)

## 2018-03-12 LAB — GLUCOSE, CAPILLARY
GLUCOSE-CAPILLARY: 126 mg/dL — AB (ref 70–99)
GLUCOSE-CAPILLARY: 161 mg/dL — AB (ref 70–99)
GLUCOSE-CAPILLARY: 191 mg/dL — AB (ref 70–99)
GLUCOSE-CAPILLARY: 193 mg/dL — AB (ref 70–99)
Glucose-Capillary: 166 mg/dL — ABNORMAL HIGH (ref 70–99)
Glucose-Capillary: 143 mg/dL — ABNORMAL HIGH (ref 70–99)

## 2018-03-12 LAB — PHOSPHORUS: Phosphorus: 2.7 mg/dL (ref 2.5–4.6)

## 2018-03-12 LAB — MAGNESIUM: MAGNESIUM: 2.1 mg/dL (ref 1.7–2.4)

## 2018-03-12 LAB — CBC
HEMATOCRIT: 24.7 % — AB (ref 35.0–47.0)
HEMOGLOBIN: 8.3 g/dL — AB (ref 12.0–16.0)
MCH: 33.1 pg (ref 26.0–34.0)
MCHC: 33.7 g/dL (ref 32.0–36.0)
MCV: 98.2 fL (ref 80.0–100.0)
Platelets: 288 10*3/uL (ref 150–440)
RBC: 2.51 MIL/uL — ABNORMAL LOW (ref 3.80–5.20)
RDW: 16 % — AB (ref 11.5–14.5)
WBC: 10.7 10*3/uL (ref 3.6–11.0)

## 2018-03-12 MED ORDER — FAT EMULSION PLANT BASED 20 % IV EMUL
250.0000 mL | INTRAVENOUS | Status: AC
  Administered 2018-03-12: 250 mL via INTRAVENOUS
  Filled 2018-03-12: qty 250

## 2018-03-12 MED ORDER — TRACE MINERALS CR-CU-MN-SE-ZN 10-1000-500-60 MCG/ML IV SOLN
INTRAVENOUS | Status: DC
  Filled 2018-03-12: qty 960

## 2018-03-12 MED ORDER — TRACE MINERALS CR-CU-MN-SE-ZN 10-1000-500-60 MCG/ML IV SOLN
INTRAVENOUS | Status: AC
  Administered 2018-03-12: 18:00:00 via INTRAVENOUS
  Filled 2018-03-12: qty 960

## 2018-03-12 MED ORDER — ENSURE ENLIVE PO LIQD
237.0000 mL | Freq: Two times a day (BID) | ORAL | Status: DC
  Administered 2018-03-12 – 2018-03-16 (×9): 237 mL via ORAL

## 2018-03-12 MED ORDER — HYDRALAZINE HCL 25 MG PO TABS
25.0000 mg | ORAL_TABLET | Freq: Three times a day (TID) | ORAL | Status: DC
  Administered 2018-03-12 – 2018-03-16 (×14): 25 mg via ORAL
  Filled 2018-03-12 (×14): qty 1

## 2018-03-12 MED ORDER — TRACE MINERALS CR-CU-MN-SE-ZN 10-1000-500-60 MCG/ML IV SOLN
INTRAVENOUS | Status: AC
  Administered 2018-03-12: 10:00:00 via INTRAVENOUS
  Filled 2018-03-12: qty 960

## 2018-03-12 MED ORDER — METOPROLOL TARTRATE 25 MG PO TABS
25.0000 mg | ORAL_TABLET | Freq: Two times a day (BID) | ORAL | Status: DC
  Administered 2018-03-12 – 2018-03-16 (×9): 25 mg via ORAL
  Filled 2018-03-12 (×9): qty 1

## 2018-03-12 NOTE — Progress Notes (Signed)
[facility] [place] NAME: Tara [name]    MR#:  [id number]  DATE OF BIRTH:  1918/05/22  SUBJECTIVE:  Patient seen and evaluated today Tolerating liquid diet well Decreased abdominal pain No nausea and vomiting   REVIEW OF SYSTEMS:    Review of Systems  Constitutional: No fever HENT: Negative for ear pain, nosebleeds, congestion, facial swelling, rhinorrhea, neck pain, neck stiffness and ear discharge.   Respiratory: Negative for cough, shortness of breath, wheezing  Cardiovascular: Negative for chest pain, palpitations and leg swelling.  Gastrointestinal: Negative for heartburn, no abdominal pain ,no vomiting, diarrhea or consitpation Genitourinary: Negative for dysuria, urgency, frequency, hematuria Musculoskeletal: Negative for back pain or joint pain Neurological: Negative for dizziness, seizures, syncope, focal weakness,  numbness and headaches.  Hematological: Does not bruise/bleed easily.  Psychiatric/Behavioral: Negative for hallucinations, confusion, dysphoric mood   DRUG ALLERGIES:   Allergies  Allergen Reactions  . Ace Inhibitors Other (See Comments)    Angioedema  . Hydrochlorothiazide W-Triamterene Other (See Comments)    Gout flare  . Lisinopril Swelling and Other (See Comments)  . Penicillins Swelling  . Trichlormethiazide Other (See Comments)    Gout flare    VITALS:  Blood pressure (!) 159/70, pulse 82, temperature 99.8 F (37.7 C), temperature source Oral, resp. rate 20, height 4\' 11"  (1.499 m), weight 108 lb (49 kg), SpO2 97 %.  PHYSICAL EXAMINATION:  Constitutional: Appears thin and frail and has NG tube placed no distress. HENT: Normocephalic. Marland Kitchen Oropharynx is clear and moist.  Eyes: Conjunctivae and EOM are normal. PERRLA, no scleral icterus.  Neck: Normal ROM. Neck supple. No JVD. No tracheal deviation. CVS: RRR, S1/S2 +, no murmurs, no gallops, no carotid bruit.  Pulmonary: Effort and breath sounds  normal, no stridor, rhonchi, wheezes, rales.  Abdominal: soft, non distended, lower abdominal bandage noted Decreased tenderness Musculoskeletal: Normal range of motion. No edema and no tenderness.  Neuro: Alert. CN 2-12 grossly intact. No focal deficits. Skin: Skin is warm and dry. No rash noted. Psychiatric: Normal mood and affect.      LABORATORY PANEL:   CBC Recent Labs  Lab 03/12/18 0500  WBC 10.7  HGB 8.3*  HCT 24.7*  PLT 288   ------------------------------------------------------------------------------------------------------------------  Chemistries  Recent Labs  Lab 03/11/18 0328 03/12/18 0707  NA 140 138  K 4.4 3.8  CL 107 108  CO2 26 25  GLUCOSE 202* 179*  BUN 71* 66*  CREATININE 1.42* 1.22*  CALCIUM 7.2* 7.2*  MG 2.5* 2.1  AST 17  --   ALT 9  --   ALKPHOS 46  --   BILITOT 0.3  --    ------------------------------------------------------------------------------------------------------------------  Cardiac Enzymes No results for input(s): TROPONINI in the last 168 hours. ------------------------------------------------------------------------------------------------------------------  RADIOLOGY:  Dg Chest 1 View  Result Date: 03/11/2018 CLINICAL DATA:  Worsening cough. EXAM: CHEST  1 VIEW COMPARISON:  03/08/2018. FINDINGS: Right PICC line noted with tip over superior vena cava. Cardiomegaly with pulmonary venous congestion, bilateral pulmonary infiltrates/edema, bilateral pleural effusions. No interim change. IMPRESSION: 1.  Right PICC line noted with tip over superior vena cava. 2. Cardiomegaly with pulmonary venous congestion, bilateral pulmonary infiltrates/edema, bilateral pleural effusions. 3.  Bibasilar atelectasis. Electronically Signed   By: Marcello Moores  Register   On: 03/11/2018 09:29     ASSESSMENT AND PLAN:   84 year old female with history of diabetes who is admitted due to abdominal pain   1.  S/p laparotomy and lysis of abdominal  adhesions and incarcerated femoral hernia was taken care with femoral hernia repair with mesh Surgery f/u appreciated Tolerated liquid diet Advance to oral diet  2.  Hypertensive urgency resolved  off IV nicardipine drip  Start patient on oral metoprolol and oral hydralazine along with PRN IV hydralazine for control of blood pressure Continue clonidine patch  3.Bilateral pleural effusions and pulmonary congestion secondary to fluid overload from IV fluids and TPN Gentle Lasix IV for diuresis as needed  4.  Hypothyroid: Continue Synthroid  5.  Protein calorie malnutrition Wean TPN Patient started on diet  6.Physical therapy follow-up and social worker follow-up Patient will not be able to take care of herself at home SNF placement as recommended by physical therapy Social worker consultation  Management plans discussed with the patient and and patient's family who are in agreement with the plan  CODE STATUS: full  TOTAL TIME TAKING CARE OF THIS PATIENT: 33 minutes.     POSSIBLE D/C 2-5 days DEPENDING ON CLINICAL CONDITION.   Saundra Shelling M.D on 03/12/2018 at 11:42 AM  Between 7am to 6pm - Pager - [phone] After 6pm go to [email or web address] - password [email or web address] [facility]  Office  [phone]  CC: Primary care physician; Einar Pheasant, MD  Note: This dictation was prepared with Dragon dictation along with smaller phrase technology. Any transcriptional errors that result from this process are unintentional.

## 2018-03-12 NOTE — Progress Notes (Addendum)
Nutrition Follow-up  DOCUMENTATION CODES:   Severe malnutrition in context of chronic illness  INTERVENTION:   Decrease Clinimix 5/15 without electrolytes to 40 mL/hr- Plan to discontinue 8/3 if tolerating regular diet.    Continue 20% ILE at 15 mL/hr over 12 hours.   Continue adult MVI and trace elements as daily TPN additives.  Continue daily weights.  NUTRITION DIAGNOSIS:   Severe Malnutrition related to chronic illness(DM, advanced age) as evidenced by severe fat depletion, severe muscle depletion.  Ongoing - addressing with TPN.  GOAL:   Patient will meet greater than or equal to 90% of their needs  Met with TPN.  MONITOR:   PO intake, Supplement acceptance, Labs, Weight trends, Skin, I & O's  REASON FOR ASSESSMENT:   Consult New TPN/TNA  ASSESSMENT:   84 y/o female with h/o DM, HTN admitted with SBO   -Patient transferred to ICU on 7/27 for hypertensive emergency. -Patient s/p diagnostic laparoscopy, lysis of adhesions, and open femoral hernia repair with mesh on 7/30.   Pt doing well; advanced to GI soft diet today. Plan to begin weaning the TPN per MD. Will add Ensure. NGT removed.    Medications reviewed and include: heparin, hydralazine, insulin, levothyroxine, metoprolol, nitroglycerin, famotidine.  Labs reviewed: K 3.8 wnl, P 2.7 wnl, Mg 2.1 wnl Hgb 8.3(L), Hct 24.7(L) cbgs- 193, 161, 166 x 24 hrs Triglycerides- 112- 7/29  Diet Order:   Diet Order           DIET SOFT Room service appropriate? Yes; Fluid consistency: Thin  Diet effective now          EDUCATION NEEDS:   Education needs have been addressed  Skin:  Skin Assessment: Reviewed RN Assessment  Last BM:  7/27  Height:   Ht Readings from Last 1 Encounters:  03/02/18 _0  (1.499 m)    Weight:   Wt Readings from Last 1 Encounters:  03/12/18 108 lb (49 kg)    Ideal Body Weight:  44.5 kg  BMI:  Body mass index is 21.81 kg/m.  Estimated Nutritional Needs:   Kcal:   1100-1300kcal/day   Protein:  60-70g/day  Fluid:  >1.1L/day   Koleen Distance MS, RD, LDN Pager #- [phone] Office#- [phone] After Hours Pager: [phone]

## 2018-03-12 NOTE — Progress Notes (Addendum)
PHARMACY - ADULT TOTAL PARENTERAL NUTRITION CONSULT NOTE   Pharmacy Consult for TPN Indication: bowel obstruction  Patient Measurements: Height: 4\' 11"  (149.9 cm) Weight: 103 lb 2.8 oz (46.8 kg) IBW/kg (Calculated) : 43.2 TPN AdjBW (KG): 46.3 Body mass index is 20.84 kg/m.   Assessment: 84 yo female with SBO on TPN  Endo: Pt with hx of DM Insulin requirements in the past 24 hours: 19 units Lytes: K 3.8, Mag 2.1, Phos 2.7 - no supplementation at this time Renal: SCr 1.42, CrCl 15.1 ml/min   TPN Access: PICC TPN start date: 7/26 Nutritional Goals (per RD recommendation on 7/26):  Estimated Nutritional Needs: Kcal:1100-1300kcal/day Protein:60-70g/day Fluid:>1.1L/day  Goal TPN rate is 50 ml/hr (provides 1212kcal/day,60g/day protein, 1313ml volume)  Current Nutrition: NPO  Plan:  Decrease Clinimix 5/15 without electrolytes to 40 ml/hr. MVI and trace elements added to TPN Plan is to advance soft diet today and wean TPN. Will D/C tomorrow if patient is tolerating regular diet Continue 20% ILE at 15 ml/hr over 12 hours  Continue to monitor electrolytes daily while on TPN without electrolytes.   Will continue moderate scale SSI Q4hr.Will continue to follow diabetes team recommendations.   Monitor TPN labs, BG  Pharmacy will continue to monitor and adjust per consult.   Tawnya Crook, PharmD Pharmacy Resident  03/12/2018 11:32 AM

## 2018-03-12 NOTE — Progress Notes (Addendum)
Subjective:    Tara [name] is a 84 y.o. female  Hospital stay day 10, 3 Days Post-Op dx lap, open femoral hernia repair  No issues last night, passing flatus, transferred to floor, tolerating clears.   Objective:      Temp:  [98.1 F (36.7 C)-99.8 F (37.7 C)] 99.8 F (37.7 C) (08/02 0408) Pulse Rate:  [69-84] 84 (08/02 0408) Resp:  [18-27] 20 (08/02 0408) BP: (155-186)/(38-93) 155/56 (08/02 0408) SpO2:  [98 %-100 %] 100 % (08/02 0408) Weight:  [49 kg (108 lb)] 49 kg (108 lb) (08/02 0408)     Height: 4\' 11"  (149.9 cm) Weight: 49 kg (108 lb) BMI (Calculated): 21.8   Intake/Output this shift:  No intake/output data recorded.       Constitutional :  alert and cooperative  Respiratory:  clear to auscultation bilaterally  Cardiovascular:  regular rate and rhythm, S1, S2 normal, no murmur, click, rub or gallop  Gastrointestinal: soft, non-tender; bowel sounds normal; no masses,  no organomegaly.   Skin: Cool and moist. Groin incision c/d/i.  Small hematoma/seroma formation, no siginificant change from yesterday  Psychiatric: Normal affect, non-agitated, not confused       LABS:  CMP Latest Ref Rng & Units 03/12/2018 03/11/2018 03/10/2018  Glucose 70 - 99 mg/dL 179(H) 202(H) 227(H)  BUN 8 - 23 mg/dL 66(H) 71(H) 62(H)  Creatinine 0.44 - 1.00 mg/dL 1.22(H) 1.42(H) 1.42(H)  Sodium 135 - 145 mmol/L 138 140 139  Potassium 3.5 - 5.1 mmol/L 3.8 4.4 4.8  Chloride 98 - 111 mmol/L 108 107 107  CO2 22 - 32 mmol/L 25 26 27   Calcium 8.9 - 10.3 mg/dL 7.2(L) 7.2(L) 7.3(L)  Total Protein 6.5 - 8.1 g/dL - 4.9(L) -  Total Bilirubin 0.3 - 1.2 mg/dL - 0.3 -  Alkaline Phos 38 - 126 U/L - 46 -  AST 15 - 41 U/L - 17 -  ALT 0 - 44 U/L - 9 -   CBC Latest Ref Rng & Units 03/12/2018 03/11/2018 03/10/2018  WBC 3.6 - 11.0 K/uL 10.7 12.6(H) 11.6(H)  Hemoglobin 12.0 - 16.0 g/dL 8.3(L) 8.6(L) 8.8(L)  Hematocrit 35.0 - 47.0 % 24.7(L) 25.2(L) 27.1(L)  Platelets 150 - 440 K/uL 288 296 275     RADS: CLINICAL DATA:  Worsening cough.  EXAM: CHEST  1 VIEW  COMPARISON:  03/08/2018.  FINDINGS: Right PICC line noted with tip over superior vena cava. Cardiomegaly with pulmonary venous congestion, bilateral pulmonary infiltrates/edema, bilateral pleural effusions. No interim change.  IMPRESSION: 1.  Right PICC line noted with tip over superior vena cava.  2. Cardiomegaly with pulmonary venous congestion, bilateral pulmonary infiltrates/edema, bilateral pleural effusions.  3.  Bibasilar atelectasis.  Assessment:   S/p dx lap, open femoral hernia repair with mesh for incarcerated bowel.  advance to regular diet.  Leukocytosis decreased.  Anemia, stable.  Likely from surgery.  Will continue to monitor.  Continue TPN until tolerating reg diet. Left message for PharmD  Surgery will follow peripherally.  HTN management per primary team.  Please call with any additional questions or concerns.  followup in office two weeks from day of surgery

## 2018-03-12 NOTE — Evaluation (Signed)
Physical Therapy Re-Evaluation Patient Details Name: Tara [name] MRN: [id number] DOB: [birth year] Today's Date: 03/12/2018   History of Present Illness  Pt is a 84 year old female with history of diabetes who is admitted due to abdominal pain with NG tube placed. Assessment includes: SBO versus ileus, accelerated HTN, hypothyroidism, and DM. Pt was transferred to the floor on 03/04/18 but was noted to have hypertensive emergencywhich resulted in pt being admitted back to the ICU on 03/06/18. hernia repair inguinal incarcerated insertion of mesh with lysis of abdominal adhesions 03/09/18 and patient transferred to floor.  Clinical Impression  Patient is A&Ox4 at start of session, sleepy but easily wakes when spoken to. Patient reports living alone in 1 story home with ramped entrance, with family that checks on her and assists with IADLs. Patient reports being ambulatory with RW in home prior to admission. At rest patient BP 160/88. Patient able to participate in bed level exercises, with min assistance from PT. Patient able to mobilize to EOB with minAx1 for LE and minimally for trunk elevation. Patient able to sit EOB with feet support and b/l UE supported with CGA/minAx ~74minutes. BP measured to be 178/144, patient asymptomatic, returned to supine with minAx1. BP of 185/69 after 2 minutes. Sitting attempted again, patient demonstrating more fatigue and difficulty maintaining balance, BP 161/118. Further mobility held due to BP concerns, nursing notified and in room at end of session to administer medication. The patient demonstrated a significant change from PLOF and would benefit from further skilled PT to address these deficits, and to maximize independence, mobility, and safety.     Follow Up Recommendations SNF;Supervision for mobility/OOB    Equipment Recommendations  None recommended by PT    Recommendations for Other Services       Precautions / Restrictions Precautions Precautions:  Fall Restrictions Weight Bearing Restrictions: No      Mobility  Bed Mobility Overal bed mobility: Needs Assistance Bed Mobility: Supine to Sit;Sit to Supine     Supine to sit: Min assist Sit to supine: Min assist   General bed mobility comments: Assistance needed for RLE management as well as minimal trunk elevation, mod assist to scoot to EOB  Transfers                 General transfer comment: unable/unsafe (elevated BP)  Ambulation/Gait             General Gait Details: unable/unsafe (elevated BP)  Stairs            Wheelchair Mobility    Modified Rankin (Stroke Patients Only)       Balance Overall balance assessment: Needs assistance Sitting-balance support: Bilateral upper extremity supported;Feet supported Sitting balance-Leahy Scale: Fair                                       Pertinent Vitals/Pain Pain Assessment: Faces Faces Pain Scale: Hurts little more Pain Location: abdomen    Home Living Family/patient expects to be discharged to:: Private residence Living Arrangements: Alone Available Help at Discharge: Family;Available PRN/intermittently Type of Home: House Home Access: Ramped entrance     Home Layout: One level Home Equipment: Walker - 2 wheels;Walker - 4 wheels      Prior Function Level of Independence: Needs assistance   Gait / Transfers Assistance Needed: ambulates with RW in household, unable to recall last time she fell. Uses WC for MD appts.  ADL's / Homemaking Assistance Needed: Daughter assists with meals, errands, meds, bathing, and dressing        Hand Dominance   Dominant Hand: Right    Extremity/Trunk Assessment   Upper Extremity Assessment Upper Extremity Assessment: Generalized weakness;RUE deficits/detail;LUE deficits/detail RUE Deficits / Details: PICC line in place resistive MMT deferred, at least 3/5 LUE Deficits / Details: at least 3/5    Lower Extremity Assessment Lower  Extremity Assessment: Generalized weakness;RLE deficits/detail;LLE deficits/detail RLE Deficits / Details: Patient unable to perform SLR on RLE LLE Deficits / Details: Patient able to perform limited SLR with effort       Communication   Communication: HOH  Cognition Arousal/Alertness: Awake/alert Behavior During Therapy: WFL for tasks assessed/performed Overall Cognitive Status: Within Functional Limits for tasks assessed                                        General Comments      Exercises General Exercises - Lower Extremity Ankle Circles/Pumps: AROM;Both Heel Slides: AAROM;Both;10 reps Straight Leg Raises: AAROM;Both;10 reps Other Exercises Other Exercises: Patient able to sit EOB ~3-52mins x 2 during session with CGA/minAx1 from PT   Assessment/Plan    PT Assessment Patient needs continued PT services  PT Problem List Decreased strength;Decreased activity tolerance;Decreased balance;Decreased mobility;Decreased knowledge of use of DME;Pain       PT Treatment Interventions DME instruction;Gait training;Functional mobility training;Balance training;Therapeutic exercise;Therapeutic activities;Patient/family education    PT Goals (Current goals can be found in the Care Plan section)  Acute Rehab PT Goals Patient Stated Goal: to get better, have less abdominal pain PT Goal Formulation: With patient Time For Goal Achievement: 03/26/18 Potential to Achieve Goals: Fair    Frequency Min 2X/week   Barriers to discharge Inaccessible home environment;Decreased caregiver support      Co-evaluation               AM-PAC PT "6 Clicks" Daily Activity  Outcome Measure Difficulty turning over in bed (including adjusting bedclothes, sheets and blankets)?: Unable Difficulty moving from lying on back to sitting on the side of the bed? : Unable Difficulty sitting down on and standing up from a chair with arms (e.g., wheelchair, bedside commode, etc,.)?:  Unable Help needed moving to and from a bed to chair (including a wheelchair)?: A Lot Help needed walking in hospital room?: A Lot Help needed climbing 3-5 steps with a railing? : Total 6 Click Score: 8    End of Session Equipment Utilized During Treatment: Gait belt Activity Tolerance: Patient limited by lethargy;Patient limited by pain;Treatment limited secondary to medical complications (Comment) Patient left: in bed;with nursing/sitter in room;with bed alarm set;with call bell/phone within reach Nurse Communication: Mobility status PT Visit Diagnosis: Unsteadiness on feet (R26.81);Muscle weakness (generalized) (M62.81);Difficulty in walking, not elsewhere classified (R26.2)    Time: 3762-8315 PT Time Calculation (min) (ACUTE ONLY): 34 min   Charges:   PT Evaluation $PT Re-evaluation: 1 Re-eval PT Treatments $Therapeutic Activity: 8-22 mins       Lieutenant Diego PT, DPT 2:35 PM,03/12/18 [phone]

## 2018-03-12 NOTE — Clinical Social Work Note (Signed)
Patient received bed offer from facility of choice: [facility]. [facility] has received auth from [employer] and can take patient when ready for discharge. Patient's daughter, Ms. Ricard Dillon,  is in agreement and aware. Lovena Le at [facility] provided room and report: Rm 216 and Report: [place] MSW,LcSW [phone]

## 2018-03-13 LAB — BASIC METABOLIC PANEL
Anion gap: 4 — ABNORMAL LOW (ref 5–15)
BUN: 62 mg/dL — AB (ref 8–23)
CHLORIDE: 107 mmol/L (ref 98–111)
CO2: 26 mmol/L (ref 22–32)
Calcium: 7 mg/dL — ABNORMAL LOW (ref 8.9–10.3)
Creatinine, Ser: 1.17 mg/dL — ABNORMAL HIGH (ref 0.44–1.00)
GFR calc Af Amer: 43 mL/min — ABNORMAL LOW (ref >60)
GFR calc non Af Amer: 38 mL/min — ABNORMAL LOW (ref >60)
GLUCOSE: 132 mg/dL — AB (ref 70–99)
POTASSIUM: 3.8 mmol/L (ref 3.5–5.1)
Sodium: 137 mmol/L (ref 135–145)

## 2018-03-13 LAB — GLUCOSE, CAPILLARY
GLUCOSE-CAPILLARY: 134 mg/dL — AB (ref 70–99)
GLUCOSE-CAPILLARY: 138 mg/dL — AB (ref 70–99)
GLUCOSE-CAPILLARY: 158 mg/dL — AB (ref 70–99)
Glucose-Capillary: 141 mg/dL — ABNORMAL HIGH (ref 70–99)
Glucose-Capillary: 149 mg/dL — ABNORMAL HIGH (ref 70–99)
Glucose-Capillary: 179 mg/dL — ABNORMAL HIGH (ref 70–99)

## 2018-03-13 LAB — SURGICAL PATHOLOGY

## 2018-03-13 MED ORDER — DOCUSATE SODIUM 100 MG PO CAPS
100.0000 mg | ORAL_CAPSULE | Freq: Two times a day (BID) | ORAL | Status: DC
  Administered 2018-03-13 – 2018-03-14 (×2): 100 mg via ORAL
  Filled 2018-03-13 (×2): qty 1

## 2018-03-13 MED ORDER — LEVOTHYROXINE SODIUM 50 MCG PO TABS
50.0000 ug | ORAL_TABLET | Freq: Every day | ORAL | Status: DC
  Administered 2018-03-14 – 2018-03-16 (×3): 50 ug via ORAL
  Filled 2018-03-13 (×3): qty 1

## 2018-03-13 MED ORDER — FAMOTIDINE 20 MG PO TABS
10.0000 mg | ORAL_TABLET | Freq: Every day | ORAL | Status: DC
  Administered 2018-03-14 – 2018-03-16 (×3): 10 mg via ORAL
  Filled 2018-03-13 (×3): qty 1

## 2018-03-13 NOTE — Progress Notes (Signed)
[facility] at [place] NAME: Tara [name]    MR#:  [id number]  DATE OF BIRTH:  1918-05-29  SUBJECTIVE:  Slowly improving. Tolerating diet REVIEW OF SYSTEMS:    Review of Systems  Constitutional: No fever HENT: Negative for ear pain, nosebleeds, congestion, facial swelling, rhinorrhea, neck pain, neck stiffness and ear discharge.   Respiratory: Negative for cough, shortness of breath, wheezing  Cardiovascular: Negative for chest pain, palpitations and leg swelling.  Gastrointestinal: Negative for heartburn, no abdominal pain ,no vomiting, diarrhea or consitpation Genitourinary: Negative for dysuria, urgency, frequency, hematuria Musculoskeletal: Negative for back pain or joint pain Neurological: Negative for dizziness, seizures, syncope, focal weakness,  numbness and headaches.  Hematological: Does not bruise/bleed easily.  Psychiatric/Behavioral: Negative for hallucinations, confusion, dysphoric mood DRUG ALLERGIES:   Allergies  Allergen Reactions  . Ace Inhibitors Other (See Comments)    Angioedema  . Hydrochlorothiazide W-Triamterene Other (See Comments)    Gout flare  . Lisinopril Swelling and Other (See Comments)  . Penicillins Swelling  . Trichlormethiazide Other (See Comments)    Gout flare    VITALS:  Blood pressure (!) 159/57, pulse 66, temperature 98.7 F (37.1 C), temperature source Oral, resp. rate (!) 24, height 4\' 11"  (1.499 m), weight 48.9 kg (107 lb 12.9 oz), SpO2 99 %.  PHYSICAL EXAMINATION:  Constitutional: Appears thin and frail and has NG tube placed no distress. HENT: Normocephalic. Marland Kitchen Oropharynx is clear and moist.  Eyes: Conjunctivae and EOM are normal. PERRLA, no scleral icterus.  Neck: Normal ROM. Neck supple. No JVD. No tracheal deviation. CVS: RRR, S1/S2 +, no murmurs, no gallops, no carotid bruit.  Pulmonary: Effort and breath sounds normal, no stridor, rhonchi, wheezes, rales.  Abdominal: soft, non  distended, lower abdominal bandage noted Decreased tenderness Musculoskeletal: Normal range of motion. No edema and no tenderness.  Neuro: Alert. CN 2-12 grossly intact. No focal deficits. Skin: Skin is warm and dry. No rash noted. Psychiatric: Normal mood and affect.  LABORATORY PANEL:   CBC Recent Labs  Lab 03/12/18 0500  WBC 10.7  HGB 8.3*  HCT 24.7*  PLT 288   ------------------------------------------------------------------------------------------------------------------  Chemistries  Recent Labs  Lab 03/11/18 0328 03/12/18 0707 03/13/18 0336  NA 140 138 137  K 4.4 3.8 3.8  CL 107 108 107  CO2 26 25 26   GLUCOSE 202* 179* 132*  BUN 71* 66* 62*  CREATININE 1.42* 1.22* 1.17*  CALCIUM 7.2* 7.2* 7.0*  MG 2.5* 2.1  --   AST 17  --   --   ALT 9  --   --   ALKPHOS 46  --   --   BILITOT 0.3  --   --    ------------------------------------------------------------------------------------------------------------------   ASSESSMENT AND PLAN:  84 year old female with history of diabetes who is admitted due to abdominal pain   1.  S/p laparotomy and lysis of abdominal adhesions and incarcerated femoral hernia was taken care with femoral hernia repair with mesh Surgery f/u appreciated Tolerated soft diet - Advance to regular diet  2.  Hypertensive urgency resolved - continue metoprolol and oral hydralazine along with PRN IV hydralazine for control of blood pressure Continue clonidine patch  3.Bilateral pleural effusions and pulmonary congestion secondary to fluid overload from IV fluids and TPN Gentle Lasix IV for diuresis as needed  4.  Hypothyroid: Continue Synthroid  5.  Protein calorie malnutrition Wean TPN - tolerating diet  6.Physical therapy - recommends STR    Potential D/C  to STR/SNF in 1-2 days depending on clinical condition   Management plans discussed with the patient and nursing who are in agreement with the plan  CODE STATUS: full  TOTAL  TIME TAKING CARE OF THIS PATIENT: 33 minutes.     POSSIBLE D/C 1-2 days DEPENDING ON CLINICAL CONDITION.   Max Sane M.D on 03/13/2018 at 11:25 AM  Between 7am to 6pm - Pager - [phone] After 6pm go to [email or web address] - password [email or web address] [facility]  Office  [phone]  CC: Primary care physician; Einar Pheasant, MD  Note: This dictation was prepared with Dragon dictation along with smaller phrase technology. Any transcriptional errors that result from this process are unintentional.

## 2018-03-13 NOTE — Progress Notes (Signed)
4 Days Post-Op  Subjective: Status post diagnostic laparoscopy and femoral hernia repair. Patient doing quite well.  Objective: Vital signs in last 24 hours: Temp:  [98.7 F (37.1 C)-99.9 F (37.7 C)] 98.7 F (37.1 C) (08/03 0612) Pulse Rate:  [66-82] 66 (08/03 0612) Resp:  [16-24] 24 (08/03 0612) BP: (153-166)/(53-122) 159/57 (08/03 0612) SpO2:  [97 %-100 %] 99 % (08/03 0612) Weight:  [107 lb 12.9 oz (48.9 kg)] 107 lb 12.9 oz (48.9 kg) (08/03 0427) Last BM Date: 03/06/18  Intake/Output from previous day: 08/02 0701 - 08/03 0700 In: 1539 [I.V.:1489; IV Piggyback:50] Out: 100 [Urine:100] Intake/Output this shift: No intake/output data recorded.  Physical exam:  Abdomen soft nondistended nontympanitic and nontender right groin incision is healing well with Dermabond in place.  Erythema no drainage  Lab Results: CBC  Recent Labs    03/11/18 0328 03/12/18 0500  WBC 12.6* 10.7  HGB 8.6* 8.3*  HCT 25.2* 24.7*  PLT 296 288   BMET Recent Labs    03/12/18 0707 03/13/18 0336  NA 138 137  K 3.8 3.8  CL 108 107  CO2 25 26  GLUCOSE 179* 132*  BUN 66* 62*  CREATININE 1.22* 1.17*  CALCIUM 7.2* 7.0*   PT/INR No results for input(s): LABPROT, INR in the last 72 hours. ABG No results for input(s): PHART, HCO3 in the last 72 hours.  Invalid input(s): PCO2, PO2  Studies/Results: Dg Chest 1 View  Result Date: 03/11/2018 CLINICAL DATA:  Worsening cough. EXAM: CHEST  1 VIEW COMPARISON:  03/08/2018. FINDINGS: Right PICC line noted with tip over superior vena cava. Cardiomegaly with pulmonary venous congestion, bilateral pulmonary infiltrates/edema, bilateral pleural effusions. No interim change. IMPRESSION: 1.  Right PICC line noted with tip over superior vena cava. 2. Cardiomegaly with pulmonary venous congestion, bilateral pulmonary infiltrates/edema, bilateral pleural effusions. 3.  Bibasilar atelectasis. Electronically Signed   By: Marcello Moores  Register   On: 03/11/2018 09:29     Anti-infectives: Anti-infectives (From admission, onward)   None      Assessment/Plan: s/p Procedure(s): LAPAROSCOPY DIAGNOSTIC HERNIA REPAIR INGUINAL INCARCERATED INSERTION OF MESH   Patient doing quite well.  Consider advancing diet.  Florene Glen, MD, FACS  03/13/2018

## 2018-03-13 NOTE — Clinical Social Work Note (Signed)
CSW contacted the patient's daughter, Ms. Ricard Dillon, to update her that Dr. Manuella Ghazi is planning to discharge the patient tomorrow to [facility] if the patient is stable. Ms. Ricard Dillon is in agreement with this plan. The CSW will contact Ms. Owens in the morning once the final discharge decision is made by the medical team. CSW is following.  Santiago Bumpers, MSW, Latanya Presser [phone]

## 2018-03-13 NOTE — Progress Notes (Signed)
Pt stated that she has not had a BM since surgery; Primary nurse paged and spoke to Dr. Duane Boston. Orders received for Colace 100 mg twice a day. Primary nurse to continue to monitor.

## 2018-03-14 LAB — CBC
HCT: 23.5 % — ABNORMAL LOW (ref 35.0–47.0)
Hemoglobin: 8.1 g/dL — ABNORMAL LOW (ref 12.0–16.0)
MCH: 31.6 pg (ref 26.0–34.0)
MCHC: 34.5 g/dL (ref 32.0–36.0)
MCV: 91.5 fL (ref 80.0–100.0)
PLATELETS: 321 10*3/uL (ref 150–440)
RBC: 2.57 MIL/uL — AB (ref 3.80–5.20)
RDW: 14.9 % — AB (ref 11.5–14.5)
WBC: 11 10*3/uL (ref 3.6–11.0)

## 2018-03-14 LAB — BASIC METABOLIC PANEL
Anion gap: 4 — ABNORMAL LOW (ref 5–15)
BUN: 58 mg/dL — AB (ref 8–23)
CALCIUM: 7.2 mg/dL — AB (ref 8.9–10.3)
CHLORIDE: 109 mmol/L (ref 98–111)
CO2: 26 mmol/L (ref 22–32)
CREATININE: 1.04 mg/dL — AB (ref 0.44–1.00)
GFR calc Af Amer: 50 mL/min — ABNORMAL LOW (ref >60)
GFR, EST NON AFRICAN AMERICAN: 43 mL/min — AB (ref >60)
Glucose, Bld: 164 mg/dL — ABNORMAL HIGH (ref 70–99)
Potassium: 4.2 mmol/L (ref 3.5–5.1)
SODIUM: 139 mmol/L (ref 135–145)

## 2018-03-14 LAB — GLUCOSE, CAPILLARY
GLUCOSE-CAPILLARY: 111 mg/dL — AB (ref 70–99)
GLUCOSE-CAPILLARY: 149 mg/dL — AB (ref 70–99)
Glucose-Capillary: 109 mg/dL — ABNORMAL HIGH (ref 70–99)
Glucose-Capillary: 155 mg/dL — ABNORMAL HIGH (ref 70–99)

## 2018-03-14 MED ORDER — SENNOSIDES-DOCUSATE SODIUM 8.6-50 MG PO TABS
2.0000 | ORAL_TABLET | Freq: Two times a day (BID) | ORAL | Status: DC
  Administered 2018-03-14 – 2018-03-16 (×5): 2 via ORAL
  Filled 2018-03-14 (×5): qty 2

## 2018-03-14 NOTE — Progress Notes (Signed)
[place] at [place] NAME: Tara [name]    MR#:  [id number]  DATE OF BIRTH:  02/21/1918  SUBJECTIVE:  Slowly improving. Tolerating diet, no BM yet, feels weak REVIEW OF SYSTEMS:    Review of Systems  Constitutional: No fever HENT: Negative for ear pain, nosebleeds, congestion, facial swelling, rhinorrhea, neck pain, neck stiffness and ear discharge.   Respiratory: Negative for cough, shortness of breath, wheezing  Cardiovascular: Negative for chest pain, palpitations and leg swelling.  Gastrointestinal: Negative for heartburn, no abdominal pain ,no vomiting, diarrhea or consitpation Genitourinary: Negative for dysuria, urgency, frequency, hematuria Musculoskeletal: Negative for back pain or joint pain Neurological: Negative for dizziness, seizures, syncope, focal weakness,  numbness and headaches.  Hematological: Does not bruise/bleed easily.  Psychiatric/Behavioral: Negative for hallucinations, confusion, dysphoric mood DRUG ALLERGIES:   Allergies  Allergen Reactions  . Ace Inhibitors Other (See Comments)    Angioedema  . Hydrochlorothiazide W-Triamterene Other (See Comments)    Gout flare  . Lisinopril Swelling and Other (See Comments)  . Penicillins Swelling  . Trichlormethiazide Other (See Comments)    Gout flare    VITALS:  Blood pressure (!) 160/63, pulse 74, temperature 98.6 F (37 C), temperature source Oral, resp. rate 20, height 4\' 11"  (1.499 m), weight 48.7 kg (107 lb 5.8 oz), SpO2 100 %. PHYSICAL EXAMINATION:  Constitutional: Appears thin and frail and has NG tube placed no distress. HENT: Normocephalic. Marland Kitchen Oropharynx is clear and moist.  Eyes: Conjunctivae and EOM are normal. PERRLA, no scleral icterus.  Neck: Normal ROM. Neck supple. No JVD. No tracheal deviation. CVS: RRR, S1/S2 +, no murmurs, no gallops, no carotid bruit.  Pulmonary: Effort and breath sounds normal, no stridor, rhonchi, wheezes, rales.  Abdominal:  soft, non distended, lower abdominal bandage noted Decreased tenderness Musculoskeletal: Normal range of motion. No edema and no tenderness.  Neuro: Alert. CN 2-12 grossly intact. No focal deficits. Skin: Skin is warm and dry. No rash noted. Psychiatric: Normal mood and affect.  LABORATORY PANEL:   CBC Recent Labs  Lab 03/14/18 0554  WBC 11.0  HGB 8.1*  HCT 23.5*  PLT 321   ------------------------------------------------------------------------------------------------------------------  Chemistries  Recent Labs  Lab 03/11/18 0328 03/12/18 0707  03/14/18 0554  NA 140 138   < > 139  K 4.4 3.8   < > 4.2  CL 107 108   < > 109  CO2 26 25   < > 26  GLUCOSE 202* 179*   < > 164*  BUN 71* 66*   < > 58*  CREATININE 1.42* 1.22*   < > 1.04*  CALCIUM 7.2* 7.2*   < > 7.2*  MG 2.5* 2.1  --   --   AST 17  --   --   --   ALT 9  --   --   --   ALKPHOS 46  --   --   --   BILITOT 0.3  --   --   --    < > = values in this interval not displayed.   ------------------------------------------------------------------------------------------------------------------   ASSESSMENT AND PLAN:  84 year old female with history of diabetes who is admitted due to abdominal pain   1.  S/p laparotomy and lysis of abdominal adhesions and incarcerated femoral hernia was taken care with femoral hernia repair with mesh Surgery f/u appreciated Tolerated soft diet - Advance to regular diet  2.  Hypertensive urgency resolved - continue metoprolol and oral hydralazine along with  PRN IV hydralazine for control of blood pressure Continue clonidine patch  3.Bilateral pleural effusions and pulmonary congestion secondary to fluid overload from IV fluids and TPN Gentle Lasix IV for diuresis as needed  4.  Hypothyroid: Continue Synthroid  5.  Protein calorie malnutrition Wean TPN - tolerating diet  6.Physical therapy - recommends STR  7. Constipation: has not had a BM since surgery - started bowel  regimen (Senokot-s)     Potential D/C to STR/SNF Tomorrow depending on clinical condition - needs BM   Management plans discussed with the patient and nursing who are in agreement with the plan  CODE STATUS: full  TOTAL TIME TAKING CARE OF THIS PATIENT: 33 minutes.     POSSIBLE D/C 1 days DEPENDING ON CLINICAL CONDITION.   Max Sane M.D on 03/14/2018 at 1:33 PM  Between 7am to 6pm - Pager - [phone] After 6pm go to [email or web address] - password [email or web address] [facility]  Office  [phone]  CC: Primary care physician; Einar Pheasant, MD  Note: This dictation was prepared with Dragon dictation along with smaller phrase technology. Any transcriptional errors that result from this process are unintentional.

## 2018-03-15 ENCOUNTER — Inpatient Hospital Stay: Payer: Medicare Other

## 2018-03-15 LAB — DIFFERENTIAL
BASOS PCT: 1 %
Basophils Absolute: 0 10*3/uL (ref 0–0.1)
EOS PCT: 1 %
Eosinophils Absolute: 0.1 10*3/uL (ref 0–0.7)
Lymphocytes Relative: 16 %
Lymphs Abs: 1.6 10*3/uL (ref 1.0–3.6)
MONO ABS: 0.8 10*3/uL (ref 0.2–0.9)
MONOS PCT: 8 %
Neutro Abs: 7.3 10*3/uL — ABNORMAL HIGH (ref 1.4–6.5)
Neutrophils Relative %: 74 %

## 2018-03-15 LAB — COMPREHENSIVE METABOLIC PANEL
ALBUMIN: 1.8 g/dL — AB (ref 3.5–5.0)
ALK PHOS: 44 U/L (ref 38–126)
ALT: 9 U/L (ref 0–44)
AST: 19 U/L (ref 15–41)
Anion gap: 5 (ref 5–15)
BUN: 55 mg/dL — AB (ref 8–23)
CALCIUM: 7.4 mg/dL — AB (ref 8.9–10.3)
CO2: 26 mmol/L (ref 22–32)
CREATININE: 1.28 mg/dL — AB (ref 0.44–1.00)
Chloride: 110 mmol/L (ref 98–111)
GFR calc Af Amer: 39 mL/min — ABNORMAL LOW (ref >60)
GFR calc non Af Amer: 34 mL/min — ABNORMAL LOW (ref >60)
GLUCOSE: 131 mg/dL — AB (ref 70–99)
Potassium: 3.9 mmol/L (ref 3.5–5.1)
Sodium: 141 mmol/L (ref 135–145)
TOTAL PROTEIN: 5.3 g/dL — AB (ref 6.5–8.1)
Total Bilirubin: 0.6 mg/dL (ref 0.3–1.2)

## 2018-03-15 LAB — CBC
HCT: 23.2 % — ABNORMAL LOW (ref 35.0–47.0)
Hemoglobin: 8 g/dL — ABNORMAL LOW (ref 12.0–16.0)
MCH: 31.3 pg (ref 26.0–34.0)
MCHC: 34.4 g/dL (ref 32.0–36.0)
MCV: 91.1 fL (ref 80.0–100.0)
PLATELETS: 368 10*3/uL (ref 150–440)
RBC: 2.55 MIL/uL — ABNORMAL LOW (ref 3.80–5.20)
RDW: 14.5 % (ref 11.5–14.5)
WBC: 9.9 10*3/uL (ref 3.6–11.0)

## 2018-03-15 MED ORDER — POLYETHYLENE GLYCOL 3350 17 G PO PACK: 17.0000 g | PACK | Freq: Every day | ORAL | 0 refills | Status: DC

## 2018-03-15 MED ORDER — CLONIDINE 0.2 MG/24HR TD PTWK: 0.2000 mg | MEDICATED_PATCH | TRANSDERMAL | 12 refills | Status: DC

## 2018-03-15 MED ORDER — HYDRALAZINE HCL 20 MG/ML IJ SOLN
10.0000 mg | Freq: Once | INTRAMUSCULAR | Status: AC
  Administered 2018-03-15: 10 mg via INTRAVENOUS

## 2018-03-15 MED ORDER — POLYETHYLENE GLYCOL 3350 17 G PO PACK
17.0000 g | PACK | Freq: Every day | ORAL | Status: DC
  Administered 2018-03-15: 17 g via ORAL
  Filled 2018-03-15 (×2): qty 1

## 2018-03-15 MED ORDER — ENSURE ENLIVE PO LIQD: 237.0000 mL | Freq: Two times a day (BID) | ORAL | 12 refills | Status: DC

## 2018-03-15 MED ORDER — METOPROLOL TARTRATE 25 MG PO TABS: 25.0000 mg | ORAL_TABLET | Freq: Two times a day (BID) | ORAL | 0 refills | Status: DC

## 2018-03-15 MED ORDER — ADULT MULTIVITAMIN W/MINERALS CH
1.0000 | ORAL_TABLET | Freq: Every day | ORAL | Status: DC
  Administered 2018-03-16: 1 via ORAL
  Filled 2018-03-15 (×2): qty 1

## 2018-03-15 MED ORDER — SENNOSIDES-DOCUSATE SODIUM 8.6-50 MG PO TABS: 2.0000 | ORAL_TABLET | Freq: Two times a day (BID) | ORAL | 0 refills | Status: DC

## 2018-03-15 MED ORDER — FAMOTIDINE 10 MG PO TABS: 10.0000 mg | ORAL_TABLET | Freq: Every day | ORAL | 0 refills | Status: DC

## 2018-03-15 MED ORDER — LABETALOL HCL 5 MG/ML IV SOLN
10.0000 mg | Freq: Once | INTRAVENOUS | Status: AC
  Administered 2018-03-15: 10 mg via INTRAVENOUS
  Filled 2018-03-15: qty 4

## 2018-03-15 NOTE — Progress Notes (Signed)
Report called to [facility]. Spoke to [facility] LPN. Report provided, questions asked and answered. EMS called for transport. Awaiting transport arrival.

## 2018-03-15 NOTE — Discharge Summary (Signed)
[place] at [place] NAME: Tara [name]    MR#:  [id number]  DATE OF BIRTH:  11-14-1917  DATE OF ADMISSION:  03/01/2018   ADMITTING PHYSICIAN: Arta Silence, MD  DATE OF DISCHARGE: 03/15/18  PRIMARY CARE PHYSICIAN: Einar Pheasant, MD   ADMISSION DIAGNOSIS:  Ileus ([place]) [K56.7] Abdominal pain [R10.9] Hypertension, unspecified type [I10] Hypertensive urgency, malignant [I16.0] DISCHARGE DIAGNOSIS:  Active Problems:   Abdominal pain   Hypertensive urgency, malignant  SECONDARY DIAGNOSIS:   Past Medical History:  Diagnosis Date  . Asthma   . Diabetes mellitus ([place])   . Environmental allergies   . Hypertension    HOSPITAL COURSE:   Tara [name] is a 84 year old female with a PMH of asthma, diabetes, and HTN who presented to the ED with abdominal pain. Initial CT abdomen/pelvis showed ileus vs obstruction. She was admitted for further management.  1. Abdominal pain 2/2 incarcerated femoral hernia. - She was treated conservatively at first but did not have much improvement in her abdominal pain - Underwent laparotomy and LOA, as well as incarcerated femoral hernia repair with mesh on 7/30 - post-operative course was unremarkable and she slowly improved.  - had a BM on the day of discharge  2. Hypertensive urgency- resolved - home hydralazine was continued - home metoprolol succinate was changed to metoprolol tartrate bid - she was started on a clonidine patch and this was continued on discharge   3. RLE edema- noted on the day of discharge - doppler US was negative for DVT  4. Elevated Cr- mild bump in Cr on the day of discharge from 1.04 > 1.28 - not on any nephrotoxic meds - encouraged po intake - recommend rechecking bmp as an outpatient  5. Bilateral pleural effusions/pulmonary congestion - initially treated with IV lasix, but this was stopped after improvement - continue to monitor fluid status as an outpatient  6.  Hypothyroidism- stable - home synthroid was continued  7. Protein calorie malnutrition- initially on tpn, but transitioned to regular diet - continue regular diet and ensure bid  DISCHARGE CONDITIONS:  Stable, improved  CONSULTS OBTAINED:  Treatment Team:  Arta Silence, MD Benjamine Sprague, DO Saundra Shelling, MD DRUG ALLERGIES:   Allergies  Allergen Reactions  . Ace Inhibitors Other (See Comments)    Angioedema  . Hydrochlorothiazide W-Triamterene Other (See Comments)    Gout flare  . Lisinopril Swelling and Other (See Comments)  . Penicillins Swelling  . Trichlormethiazide Other (See Comments)    Gout flare   DISCHARGE MEDICATIONS:   Allergies as of 03/15/2018      Reactions   Ace Inhibitors Other (See Comments)   Angioedema   Hydrochlorothiazide W-triamterene Other (See Comments)   Gout flare   Lisinopril Swelling, Other (See Comments)   Penicillins Swelling   Trichlormethiazide Other (See Comments)   Gout flare      Medication List    STOP taking these medications   metoprolol succinate 25 MG 24 hr tablet Commonly known as:  TOPROL-XL     TAKE these medications   acetaminophen 325 MG tablet Commonly known as:  TYLENOL Take 650 mg by mouth every 6 (six) hours as needed for mild pain.   amLODipine 5 MG tablet Commonly known as:  NORVASC Take 5 mg by mouth daily.   ASPIRIN 81 81 MG EC tablet Generic drug:  aspirin Take 81 mg by mouth daily. Swallow whole.   CALTRATE 600+D PO Take 1 tablet by  mouth daily.   cloNIDine 0.2 mg/24hr patch Commonly known as:  CATAPRES - Dosed in mg/24 hr Place 1 patch (0.2 mg total) onto the skin once a week. Start taking on:  03/20/2018   diclofenac sodium 1 % Gel Commonly known as:  VOLTAREN Apply 4 g topically 3 (three) times daily as needed. Apply to the Right Knee for effusion   famotidine 10 MG tablet Commonly known as:  PEPCID Take 1 tablet (10 mg total) by mouth daily. Start taking on:  03/16/2018     feeding supplement (ENSURE ENLIVE) Liqd Take 237 mLs by mouth 2 (two) times daily between meals.   feeding supplement (PRO-STAT SUGAR FREE 64) Liqd Take 30 mLs by mouth 2 (two) times daily between meals.   fexofenadine 180 MG tablet Commonly known as:  ALLEGRA Take 1 tablet (180 mg total) by mouth daily.   fluticasone 50 MCG/ACT nasal spray Commonly known as:  FLONASE Place into both nostrils daily.   glucose blood test strip Commonly known as:  ACCU-CHEK AVIVA PLUS CHECK SUGAR ONCE DAILY   hydrALAZINE 50 MG tablet Commonly known as:  APRESOLINE TAKE 1 TABLET BY MOUTH 3 TIMES A DAY   metoprolol tartrate 25 MG tablet Commonly known as:  LOPRESSOR Take 25 mg by mouth 2 (two) times daily. What changed:  Another medication with the same name was added. Make sure you understand how and when to take each.   metoprolol tartrate 25 MG tablet Commonly known as:  LOPRESSOR Take 1 tablet (25 mg total) by mouth 2 (two) times daily. What changed:  You were already taking a medication with the same name, and this prescription was added. Make sure you understand how and when to take each.   polyethylene glycol packet Commonly known as:  MIRALAX / GLYCOLAX Take 17 g by mouth daily. Start taking on:  03/16/2018   senna-docusate 8.6-50 MG tablet Commonly known as:  Senokot-S Take 2 tablets by mouth 2 (two) times daily.   SYNTHROID 50 MCG tablet Generic drug:  levothyroxine TAKE ONE (1) TABLET EACH DAY        DISCHARGE INSTRUCTIONS:  1. F/u with PCP in 1-2 weeks 2. Metoprolol succinate changed to tartrate bid 3. Clonidine patch started due to hypertensive urgency 4. Patient started on stool softeners and should continue these as needed 5. Creatinine had a mild increase on the day of discharge- recommend rechecking bmp in 1 week DIET:  Regular diet DISCHARGE CONDITION:  Good ACTIVITY:  Activity as tolerated OXYGEN:  Home Oxygen: No.  Oxygen Delivery: room air DISCHARGE  LOCATION:  nursing home   If you experience worsening of your admission symptoms, develop shortness of breath, life threatening emergency, suicidal or homicidal thoughts you must seek medical attention immediately by calling 911 or calling your MD immediately  if symptoms less severe.  You Must read complete instructions/literature along with all the possible adverse reactions/side effects for all the Medicines you take and that have been prescribed to you. Take any new Medicines after you have completely understood and accpet all the possible adverse reactions/side effects.   Please note  You were cared for by a hospitalist during your hospital stay. If you have any questions about your discharge medications or the care you received while you were in the hospital after you are discharged, you can call the unit and asked to speak with the hospitalist on call if the hospitalist that took care of you is not available. Once you are discharged, your  primary care physician will handle any further medical issues. Please note that NO REFILLS for any discharge medications will be authorized once you are discharged, as it is imperative that you return to your primary care physician (or establish a relationship with a primary care physician if you do not have one) for your aftercare needs so that they can reassess your need for medications and monitor your lab values.    On the day of Discharge:  VITAL SIGNS:  Blood pressure (!) 164/46, pulse 73, temperature 99.5 F (37.5 C), temperature source Oral, resp. rate 20, height 4\' 11"  (1.499 m), weight 45.1 kg (99 lb 6.8 oz), SpO2 99 %. PHYSICAL EXAMINATION:  GENERAL:  84 y.o.-year-old patient lying in the bed with no acute distress.  EYES: Pupils equal, round, reactive to light and accommodation. No scleral icterus. Extraocular muscles intact.  HEENT: Head atraumatic, normocephalic. Oropharynx and nasopharynx clear.  NECK:  Supple, no jugular venous  distention. No thyroid enlargement, no tenderness.  LUNGS: Normal breath sounds bilaterally, no wheezing, rales,rhonchi or crepitation. No use of accessory muscles of respiration.  CARDIOVASCULAR: S1, S2 normal. No murmurs, rubs, or gallops.  ABDOMEN: Soft, non-tender, non-distended. Bowel sounds present. No organomegaly or mass.  EXTREMITIES: No pedal edema, cyanosis, or clubbing.  NEUROLOGIC: Cranial nerves II through XII are intact. Muscle strength 5/5 in all extremities. Sensation intact. Gait not checked.  PSYCHIATRIC: The patient is alert and oriented x 3.  SKIN: No obvious rash, lesion, or ulcer.  DATA REVIEW:   CBC Recent Labs  Lab 03/15/18 0343  WBC 9.9  HGB 8.0*  HCT 23.2*  PLT 368    Chemistries  Recent Labs  Lab 03/12/18 0707  03/15/18 0343  NA 138   < > 141  K 3.8   < > 3.9  CL 108   < > 110  CO2 25   < > 26  GLUCOSE 179*   < > 131*  BUN 66*   < > 55*  CREATININE 1.22*   < > 1.28*  CALCIUM 7.2*   < > 7.4*  MG 2.1  --   --   AST  --   --  19  ALT  --   --  9  ALKPHOS  --   --  44  BILITOT  --   --  0.6   < > = values in this interval not displayed.     Microbiology Results  Results for orders placed or performed during the hospital encounter of 03/01/18  MRSA PCR Screening     Status: None   Collection Time: 03/02/18  6:25 AM  Result Value Ref Range Status   MRSA by PCR NEGATIVE NEGATIVE Final    Comment:        The GeneXpert MRSA Assay (FDA approved for NASAL specimens only), is one component of a comprehensive MRSA colonization surveillance program. It is not intended to diagnose MRSA infection nor to guide or monitor treatment for MRSA infections. Performed at [facility], [address] [place], [place] [address]     RADIOLOGY:  US Venous Img Lower Unilateral Right  Result Date: 03/15/2018 CLINICAL DATA:  Edema x2 weeks. Right inguinal hernia repair 03/09/2018. EXAM: RIGHT LOWER EXTREMITY VENOUS DOPPLER ULTRASOUND TECHNIQUE:  Gray-scale sonography with compression, as well as color and duplex ultrasound, were performed to evaluate the deep venous system from the level of the common femoral vein through the popliteal and proximal calf veins. COMPARISON:  None FINDINGS: Normal compressibility  of the common femoral, superficial femoral, and popliteal veins, as well as the proximal calf veins. No filling defects to suggest DVT on grayscale or color Doppler imaging. Doppler waveforms show normal direction of venous flow, normal respiratory phasicity and response to augmentation. Complex 7.8 x 4.8 cm echogenic process in the deep subcutaneous tissues overlying the common femoral vessels. There is subcutaneous edema in the right thigh. Visualized segments of the saphenous venous system normal in caliber and compressibility. Survey views of the contralateral common femoral vein are unremarkable. IMPRESSION: 1.  No evidence of right lower extremity deep vein thrombosis. 2. 7.8 cm process overlying the right common femoral vessels possibly hematoma less likely residual/recurrent hernia, causing mild mass effect upon the right common femoral vein. Electronically Signed   By: Lucrezia Europe M.D.   On: 03/15/2018 10:35     Management plans discussed with the patient, family and they are in agreement.  CODE STATUS: Full Code   TOTAL TIME TAKING CARE OF THIS PATIENT: 35 minutes.    Tara [name] M.D on 03/15/2018 at 12:17 PM  Between 7am to 6pm - Pager - [phone]  After 6pm go to [email or web address] - Proofreader  [facility]  Office  [phone]  CC: Primary care physician; Einar Pheasant, MD   Note: This dictation was prepared with Dragon dictation along with smaller phrase technology. Any transcriptional errors that result from this process are unintentional.

## 2018-03-15 NOTE — Clinical Social Work Note (Signed)
CSW was informed that patient's blood pressure was elevated, physician cancelled discharge, patient not medically ready for discharge today per physician.  CSW updated [facility].  Jones Broom. [place], MSW, [place]  03/15/2018 4:23 PM

## 2018-03-15 NOTE — Clinical Social Work Placement (Signed)
   CLINICAL SOCIAL WORK PLACEMENT  NOTE  Date:  03/15/2018  Patient Details  Name: Tara [name] MRN: [id number] Date of Birth: Jun 06, 1918  Clinical Social Work is seeking post-discharge placement for this patient at the [place] level of care (*CSW will initial, date and re-position this form in  chart as items are completed):  Yes   Patient/family provided with [facility] Work Department's list of facilities offering this level of care within the geographic area requested by the patient (or if unable, by the patient's family).  Yes   Patient/family informed of their freedom to choose among providers that offer the needed level of care, that participate in Medicare, Medicaid or managed care program needed by the patient, have an available bed and are willing to accept the patient.  Yes   Patient/family informed of [facility]'s ownership interest in [facility] and [facility], as well as of the fact that they are under no obligation to receive care at these facilities.  PASRR submitted to EDS on 03/12/18     PASRR number received on 03/12/18     Existing PASRR number confirmed on       FL2 transmitted to all facilities in geographic area requested by pt/family on 03/12/18     FL2 transmitted to all facilities within larger geographic area on       Patient informed that his/her managed care company has contracts with or will negotiate with certain facilities, including the following:        Yes   Patient/family informed of bed offers received.  Patient chooses bed at [facility]     Physician recommends and patient chooses bed at      Patient to be transferred to [facility] on 03/15/18.  Patient to be transferred to facility by [facility]     Patient family notified on 03/15/18 of transfer.  Name of family member notified:  Jayme Cloud, patient's daughter [phone]     PHYSICIAN Please sign FL2, Please prepare  prescriptions     Additional Comment:    _______________________________________________ Ross Ludwig, LCSWA 03/15/2018, 2:31 PM

## 2018-03-15 NOTE — Progress Notes (Signed)
PT Cancellation Note  Patient Details Name: Tara [name] MRN: [id number] DOB: 01-30-1918   Cancelled Treatment:    Reason Eval/Treat Not Completed: Other (comment)(Patient out of room for testing. PT will follow up as able. )  Lieutenant Diego PT, DPT 10:00 AM,03/15/18 [phone]

## 2018-03-15 NOTE — Progress Notes (Signed)
EMS at bedside to take pt to [facility].

## 2018-03-15 NOTE — Progress Notes (Signed)
Pt able to walk with standby assistance to bathroom with walker. Pt able to have a large BM. Pt states she feels better.

## 2018-03-15 NOTE — Clinical Social Work Note (Signed)
Patient to be d/c'ed today to [facility].  Patient and family agreeable to plans will transport via ems RN to call report [phone].  CSW updated patient's daughter Jayme Cloud at [phone].  Evette Cristal, MSW, [place]

## 2018-03-15 NOTE — Progress Notes (Signed)
Paged MD to notify via Amion pt had BM and [place] completed. Awaiting callback. Per Randall Hiss SW pt is ok to go to [facility] at this time.

## 2018-03-15 NOTE — Discharge Instructions (Signed)
It was a pleasure meeting you!  You were hospitalized because you were having abdominal pain. We found that you had a hernia on your right side and the surgeons repaired this.  You had some high blood pressure- we started you on a clonidine patch to help with your blood pressure.  I have also discharged you on stool softeners and you should continue to use these as needed.  Please make sure you follow-up with your primary care doctor in the next 1-2 weeks.

## 2018-03-15 NOTE — Progress Notes (Signed)
[facility] NAME: Tara [name]    MR#:  [id number]  DATE OF BIRTH:  04-17-1918  SUBJECTIVE:  Doing well this morning. States she had one episode of abdominal pain but it resolved on its own. Still has not had a BM. Has been drinking well. Not eating that much because of a decreased appetite. Endorses swelling of her RLE. No pain. REVIEW OF SYSTEMS:    Review of Systems  Constitutional: No fever HENT: Negative for ear pain, nosebleeds, congestion, facial swelling, rhinorrhea, neck pain, neck stiffness. Respiratory: Negative for cough or shortness of breath. Cardiovascular: Negative for chest pain, palpitations and leg swelling.  Gastrointestinal: Negative for heartburn, no abdominal pain ,no vomiting, diarrhea or consitpation Genitourinary: Negative for dysuria, urgency, frequency, hematuria Musculoskeletal: Negative for back pain or joint pain. Positive for swelling in the right lower extremity.  Neurological: Negative for dizziness, seizures, syncope, focal weakness,  numbness and headaches.  Hematological: Does not bruise/bleed easily.  Psychiatric/Behavioral: Negative for hallucinations, confusion, dysphoric mood DRUG ALLERGIES:   Allergies  Allergen Reactions  . Ace Inhibitors Other (See Comments)    Angioedema  . Hydrochlorothiazide W-Triamterene Other (See Comments)    Gout flare  . Lisinopril Swelling and Other (See Comments)  . Penicillins Swelling  . Trichlormethiazide Other (See Comments)    Gout flare    VITALS:  Blood pressure (!) 164/46, pulse 73, temperature 99.5 F (37.5 C), temperature source Oral, resp. rate 20, height 4\' 11"  (1.499 m), weight 45.1 kg (99 lb 6.8 oz), SpO2 99 %. PHYSICAL EXAMINATION:  Constitutional: Appears thin and frail and has NG tube placed no distress. HENT: Normocephalic. Marland Kitchen Oropharynx is clear and moist.  Eyes: Conjunctivae and EOM are normal. PERRLA, no scleral icterus.  Neck: Normal  ROM. Neck supple. No JVD. No tracheal deviation. CVS: RRR, S1/S2 +, no murmurs, no gallops, no carotid bruit.  Pulmonary: Effort and breath sounds normal, no stridor, rhonchi, wheezes, rales.  Abdominal: soft, non distended, lower abdominal bandage noted. No tenderness. RLQ surgical incision is well-healing and non-erythematous. Musculoskeletal: Normal range of motion. +RLE pitting edema. Neuro: Alert. CN 2-12 grossly intact. No focal deficits. Skin: Skin is warm and dry. No rash noted. Psychiatric: Normal mood and affect.  LABORATORY PANEL:   CBC Recent Labs  Lab 03/15/18 0343  WBC 9.9  HGB 8.0*  HCT 23.2*  PLT 368   ------------------------------------------------------------------------------------------------------------------  Chemistries  Recent Labs  Lab 03/12/18 0707  03/15/18 0343  NA 138   < > 141  K 3.8   < > 3.9  CL 108   < > 110  CO2 25   < > 26  GLUCOSE 179*   < > 131*  BUN 66*   < > 55*  CREATININE 1.22*   < > 1.28*  CALCIUM 7.2*   < > 7.4*  MG 2.1  --   --   AST  --   --  19  ALT  --   --  9  ALKPHOS  --   --  44  BILITOT  --   --  0.6   < > = values in this interval not displayed.   ------------------------------------------------------------------------------------------------------------------   ASSESSMENT AND PLAN:  84 year old female with history of diabetes who is admitted due to abdominal pain   1.  S/p laparotomy and lysis of abdominal adhesions and incarcerated femoral hernia repair with mesh on 7/30. Has not had BM. - Surgery f/u appreciated - tolerating  regular diet  2. RLE edema- new, noted this morning - RLE doppler US to r/o DVT  3. Elevated Cr- Cr bumped from 1.04 > 1.28. - encouraged po hydration - recheck bmp in the morning.  4.  Hypertensive urgency resolved - continue metoprolol, oral hydralazine, clonidine patch - IV hydralazine prn  5. Bilateral pleural effusions and pulmonary congestion secondary to fluid overload  from IV fluids and TPN- improved, lungs clear - previously treated with IV lasix this admission, can add po lasix as needed  6.  Hypothyroid: stable - Continue Synthroid  7.  Protein calorie malnutrition- initially on tpn, now on regular diet - tolerating regular diet  8.Physical therapy - recommends STR  9. Constipation: has not had a BM since surgery - started bowel regimen (Senokot-s), add Miralax today   Potential D/C to STR/SNF 8/6 depending on clinical condition - needs BM and doppler US.   Management plans discussed with the patient and nursing who are in agreement with the plan  CODE STATUS: full  TOTAL TIME TAKING CARE OF THIS PATIENT: 33 minutes.    POSSIBLE D/C TOMORROW DEPENDING ON CLINICAL CONDITION.   Berna Spare [name] M.D on 03/15/2018 at 8:31 AM  Between 7am to 6pm - Pager - [phone] After 6pm go to [email or web address] - password [email or web address] [facility]  Office  [phone]  CC: Primary care physician; Einar Pheasant, MD  Note: This dictation was prepared with Dragon dictation along with smaller phrase technology. Any transcriptional errors that result from this process are unintentional.

## 2018-03-15 NOTE — Progress Notes (Signed)
Called Dr Lysle Pearl and notified him of pt's discharge. Left message to call RN today. Provided phone number. Awaiting callback at this time.

## 2018-03-15 NOTE — Care Management Important Message (Signed)
Copy of signed IM left with patient in room.  

## 2018-03-16 DIAGNOSIS — I10 Essential (primary) hypertension: Secondary | ICD-10-CM | POA: Diagnosis not present

## 2018-03-16 DIAGNOSIS — E1151 Type 2 diabetes mellitus with diabetic peripheral angiopathy without gangrene: Secondary | ICD-10-CM | POA: Diagnosis not present

## 2018-03-16 DIAGNOSIS — E039 Hypothyroidism, unspecified: Secondary | ICD-10-CM | POA: Diagnosis not present

## 2018-03-16 DIAGNOSIS — R609 Edema, unspecified: Secondary | ICD-10-CM | POA: Diagnosis not present

## 2018-03-16 DIAGNOSIS — K4190 Unilateral femoral hernia, without obstruction or gangrene, not specified as recurrent: Secondary | ICD-10-CM | POA: Diagnosis not present

## 2018-03-16 DIAGNOSIS — R1312 Dysphagia, oropharyngeal phase: Secondary | ICD-10-CM | POA: Diagnosis not present

## 2018-03-16 DIAGNOSIS — Z48815 Encounter for surgical aftercare following surgery on the digestive system: Secondary | ICD-10-CM | POA: Diagnosis not present

## 2018-03-16 DIAGNOSIS — Z7982 Long term (current) use of aspirin: Secondary | ICD-10-CM | POA: Diagnosis not present

## 2018-03-16 DIAGNOSIS — L7634 Postprocedural seroma of skin and subcutaneous tissue following other procedure: Secondary | ICD-10-CM | POA: Diagnosis not present

## 2018-03-16 DIAGNOSIS — E43 Unspecified severe protein-calorie malnutrition: Secondary | ICD-10-CM | POA: Diagnosis not present

## 2018-03-16 DIAGNOSIS — K4130 Unilateral femoral hernia, with obstruction, without gangrene, not specified as recurrent: Secondary | ICD-10-CM | POA: Diagnosis not present

## 2018-03-16 DIAGNOSIS — I739 Peripheral vascular disease, unspecified: Secondary | ICD-10-CM | POA: Diagnosis not present

## 2018-03-16 DIAGNOSIS — Z7401 Bed confinement status: Secondary | ICD-10-CM | POA: Diagnosis not present

## 2018-03-16 DIAGNOSIS — J45909 Unspecified asthma, uncomplicated: Secondary | ICD-10-CM | POA: Diagnosis not present

## 2018-03-16 DIAGNOSIS — R600 Localized edema: Secondary | ICD-10-CM | POA: Diagnosis not present

## 2018-03-16 DIAGNOSIS — E46 Unspecified protein-calorie malnutrition: Secondary | ICD-10-CM | POA: Diagnosis not present

## 2018-03-16 DIAGNOSIS — I120 Hypertensive chronic kidney disease with stage 5 chronic kidney disease or end stage renal disease: Secondary | ICD-10-CM | POA: Diagnosis not present

## 2018-03-16 DIAGNOSIS — R1084 Generalized abdominal pain: Secondary | ICD-10-CM | POA: Diagnosis not present

## 2018-03-16 DIAGNOSIS — M6281 Muscle weakness (generalized): Secondary | ICD-10-CM | POA: Diagnosis not present

## 2018-03-16 DIAGNOSIS — Z9181 History of falling: Secondary | ICD-10-CM | POA: Diagnosis not present

## 2018-03-16 DIAGNOSIS — N185 Chronic kidney disease, stage 5: Secondary | ICD-10-CM | POA: Diagnosis not present

## 2018-03-16 DIAGNOSIS — E034 Atrophy of thyroid (acquired): Secondary | ICD-10-CM | POA: Diagnosis not present

## 2018-03-16 DIAGNOSIS — J309 Allergic rhinitis, unspecified: Secondary | ICD-10-CM | POA: Diagnosis not present

## 2018-03-16 DIAGNOSIS — E119 Type 2 diabetes mellitus without complications: Secondary | ICD-10-CM | POA: Diagnosis not present

## 2018-03-16 DIAGNOSIS — M810 Age-related osteoporosis without current pathological fracture: Secondary | ICD-10-CM | POA: Diagnosis not present

## 2018-03-16 DIAGNOSIS — R262 Difficulty in walking, not elsewhere classified: Secondary | ICD-10-CM | POA: Diagnosis not present

## 2018-03-16 DIAGNOSIS — D649 Anemia, unspecified: Secondary | ICD-10-CM | POA: Diagnosis not present

## 2018-03-16 DIAGNOSIS — I160 Hypertensive urgency: Secondary | ICD-10-CM | POA: Diagnosis not present

## 2018-03-16 LAB — COMPREHENSIVE METABOLIC PANEL
ALBUMIN: 1.8 g/dL — AB (ref 3.5–5.0)
ALK PHOS: 44 U/L (ref 38–126)
ALT: 10 U/L (ref 0–44)
ANION GAP: 6 (ref 5–15)
AST: 18 U/L (ref 15–41)
BUN: 40 mg/dL — ABNORMAL HIGH (ref 8–23)
CALCIUM: 7.4 mg/dL — AB (ref 8.9–10.3)
CHLORIDE: 113 mmol/L — AB (ref 98–111)
CO2: 24 mmol/L (ref 22–32)
Creatinine, Ser: 0.98 mg/dL (ref 0.44–1.00)
GFR calc non Af Amer: 46 mL/min — ABNORMAL LOW (ref >60)
GFR, EST AFRICAN AMERICAN: 54 mL/min — AB (ref >60)
GLUCOSE: 143 mg/dL — AB (ref 70–99)
Potassium: 3.5 mmol/L (ref 3.5–5.1)
SODIUM: 143 mmol/L (ref 135–145)
Total Bilirubin: 0.6 mg/dL (ref 0.3–1.2)
Total Protein: 5.4 g/dL — ABNORMAL LOW (ref 6.5–8.1)

## 2018-03-16 LAB — CBC
HCT: 24.2 % — ABNORMAL LOW (ref 35.0–47.0)
HEMOGLOBIN: 8.2 g/dL — AB (ref 12.0–16.0)
MCH: 30.9 pg (ref 26.0–34.0)
MCHC: 33.9 g/dL (ref 32.0–36.0)
MCV: 91.3 fL (ref 80.0–100.0)
Platelets: 400 10*3/uL (ref 150–440)
RBC: 2.65 MIL/uL — AB (ref 3.80–5.20)
RDW: 14.9 % — ABNORMAL HIGH (ref 11.5–14.5)
WBC: 7.7 10*3/uL (ref 3.6–11.0)

## 2018-03-16 MED ORDER — AMLODIPINE BESYLATE 5 MG PO TABS
5.0000 mg | ORAL_TABLET | Freq: Every day | ORAL | Status: DC
  Administered 2018-03-16: 5 mg via ORAL
  Filled 2018-03-16: qty 1

## 2018-03-16 NOTE — Progress Notes (Signed)
Per Dr. Brett Albino pt kept and treated with IV hydralazine for elevated BP of 188/67. Pt to be rechecked later and possibly discharger then.

## 2018-03-16 NOTE — Progress Notes (Signed)
RN called [facility] and gave report on pt. RN contacted Jayme Cloud, her daughter, to alert tht pt would be headed out to [facility] today. RN called for EMS transport.

## 2018-03-16 NOTE — Progress Notes (Signed)
Chaplain rounded and wanted to see if there were any needs. Family and Pt said there were no needs with the exception of prayer. Chaplain prayed for God's touch to be available to the Pt. Chaplain discussed the herstory of the Pt and offered encouragement. Pt will be discharged today.    03/16/18 1100  Clinical Encounter Type  Visited With Patient and family together  Visit Type Spiritual support  Referral From [facility] Needs Prayer;Emotional

## 2018-03-16 NOTE — Clinical Social Work Note (Signed)
Patient's discharge has been postponed for high blood pressure for the second day in a row. Shela Leff MSW,LCSW [phone]

## 2018-03-16 NOTE — Progress Notes (Signed)
Pt discharged per MD order. IV removed. RN attempted to call both daughters with no answer. Voicemail updating them that pt has left facility left with both daughters.

## 2018-03-16 NOTE — Clinical Social Work Note (Signed)
Patient's discharge to [facility] is back on and patient transported to [facility] via EMS. [place] updated. Nurse notified daughter. Shela Leff MSW,LcSW [phone]

## 2018-03-16 NOTE — Discharge Summary (Signed)
Tara [name] NAME: Tara [name]    MR#:  [id number]  DATE OF BIRTH:  Oct 01, 1917  DATE OF ADMISSION:  03/01/2018   ADMITTING PHYSICIAN: Arta Silence, MD  DATE OF DISCHARGE: 03/16/18  PRIMARY CARE PHYSICIAN: Einar Pheasant, MD   ADMISSION DIAGNOSIS:  Ileus ([place]) [K56.7] Abdominal pain [R10.9] Hypertension, unspecified type [I10] Hypertensive urgency, malignant [I16.0] DISCHARGE DIAGNOSIS:  Active Problems:   Abdominal pain   Hypertensive urgency, malignant  SECONDARY DIAGNOSIS:   Past Medical History:  Diagnosis Date  . Asthma   . Diabetes mellitus ([place])   . Environmental allergies   . Hypertension    HOSPITAL COURSE:   Tara [name] is a 84 year old female with a PMH of asthma, diabetes, and HTN who presented to the ED with abdominal pain. Initial CT abdomen/pelvis showed ileus vs obstruction. She was admitted for further management.  1. Abdominal pain 2/2 incarcerated femoral hernia. - She was treated conservatively at first but did not have much improvement in her abdominal pain - Underwent laparotomy and LOA, as well as incarcerated femoral hernia repair with mesh on 7/30 - post-operative course was unremarkable and she slowly improved.  - had a BM on the day of discharge  2. Hypertensive urgency- occurred on admission and again on the day before discharge. No signs of hypertensive emergency. - improved with IV hydralazine and labetalol - home hydralazine and norvasc were continued - home metoprolol succinate was changed to metoprolol tartrate bid - she was started on a clonidine patch and this was continued on discharge   3. RLE edema- noted on the day before discharge - doppler US was negative for DVT  4. Bilateral pleural effusions/pulmonary congestion - initially treated with IV lasix, but this was stopped after improvement - continue to monitor fluid status as an outpatient  5. Hypothyroidism- stable -  home synthroid was continued  6. Protein calorie malnutrition- initially on tpn, but transitioned to regular diet - continue regular diet and ensure bid  DISCHARGE CONDITIONS:  Stable, improved  CONSULTS OBTAINED:  Treatment Team:  Arta Silence, MD Benjamine Sprague, DO Saundra Shelling, MD DRUG ALLERGIES:   Allergies  Allergen Reactions  . Ace Inhibitors Other (See Comments)    Angioedema  . Hydrochlorothiazide W-Triamterene Other (See Comments)    Gout flare  . Lisinopril Swelling and Other (See Comments)  . Penicillins Swelling  . Trichlormethiazide Other (See Comments)    Gout flare   DISCHARGE MEDICATIONS:   Allergies as of 03/16/2018      Reactions   Ace Inhibitors Other (See Comments)   Angioedema   Hydrochlorothiazide W-triamterene Other (See Comments)   Gout flare   Lisinopril Swelling, Other (See Comments)   Penicillins Swelling   Trichlormethiazide Other (See Comments)   Gout flare      Medication List    STOP taking these medications   metoprolol succinate 25 MG 24 hr tablet Commonly known as:  TOPROL-XL     TAKE these medications   acetaminophen 325 MG tablet Commonly known as:  TYLENOL Take 650 mg by mouth every 6 (six) hours as needed for mild pain.   amLODipine 5 MG tablet Commonly known as:  NORVASC Take 5 mg by mouth daily.   ASPIRIN 81 81 MG EC tablet Generic drug:  aspirin Take 81 mg by mouth daily. Swallow whole.   CALTRATE 600+D PO Take 1 tablet by mouth daily.   cloNIDine 0.2 mg/24hr patch Commonly known as:  CATAPRES - Dosed in mg/24 hr Place 1 patch (0.2 mg total) onto the skin once a week. Start taking on:  03/20/2018   diclofenac sodium 1 % Gel Commonly known as:  VOLTAREN Apply 4 g topically 3 (three) times daily as needed. Apply to the Right Knee for effusion   famotidine 10 MG tablet Commonly known as:  PEPCID Take 1 tablet (10 mg total) by mouth daily.   feeding supplement (ENSURE ENLIVE) Liqd Take 237 mLs by  mouth 2 (two) times daily between meals.   feeding supplement (PRO-STAT SUGAR FREE 64) Liqd Take 30 mLs by mouth 2 (two) times daily between meals.   fexofenadine 180 MG tablet Commonly known as:  ALLEGRA Take 1 tablet (180 mg total) by mouth daily.   fluticasone 50 MCG/ACT nasal spray Commonly known as:  FLONASE Place into both nostrils daily.   glucose blood test strip Commonly known as:  ACCU-CHEK AVIVA PLUS CHECK SUGAR ONCE DAILY   hydrALAZINE 50 MG tablet Commonly known as:  APRESOLINE TAKE 1 TABLET BY MOUTH 3 TIMES A DAY   metoprolol tartrate 25 MG tablet Commonly known as:  LOPRESSOR Take 1 tablet (25 mg total) by mouth 2 (two) times daily.   polyethylene glycol packet Commonly known as:  MIRALAX / GLYCOLAX Take 17 g by mouth daily.   senna-docusate 8.6-50 MG tablet Commonly known as:  Senokot-S Take 2 tablets by mouth 2 (two) times daily.   SYNTHROID 50 MCG tablet Generic drug:  levothyroxine TAKE ONE (1) TABLET EACH DAY        DISCHARGE INSTRUCTIONS:  1. F/u with PCP in 1-2 weeks 2. Metoprolol succinate changed to tartrate bid 3. Clonidine patch started due to hypertensive urgency. Monitor BP as an outpatient. 4. Patient started on stool softeners and should continue these as needed DIET:  Regular diet DISCHARGE CONDITION:  Good ACTIVITY:  Activity as tolerated OXYGEN:  Home Oxygen: No.  Oxygen Delivery: room air DISCHARGE LOCATION:  nursing home   If you experience worsening of your admission symptoms, develop shortness of breath, life threatening emergency, suicidal or homicidal thoughts you must seek medical attention immediately by calling 911 or calling your MD immediately  if symptoms less severe.  You Must read complete instructions/literature along with all the possible adverse reactions/side effects for all the Medicines you take and that have been prescribed to you. Take any new Medicines after you have completely understood and accpet  all the possible adverse reactions/side effects.   Please note  You were cared for by a hospitalist during your hospital stay. If you have any questions about your discharge medications or the care you received while you were in the hospital after you are discharged, you can call the unit and asked to speak with the hospitalist on call if the hospitalist that took care of you is not available. Once you are discharged, your primary care physician will handle any further medical issues. Please note that NO REFILLS for any discharge medications will be authorized once you are discharged, as it is imperative that you return to your primary care physician (or establish a relationship with a primary care physician if you do not have one) for your aftercare needs so that they can reassess your need for medications and monitor your lab values.    On the day of Discharge:  Seen and examined. No chest pain, no shortness of breath, no lower extremity edema VITAL SIGNS:  Blood pressure (!) 148/50, pulse 72, temperature 98.7 F (  37.1 C), temperature source Oral, resp. rate 18, height 4\' 11"  (1.499 m), weight 46.1 kg (101 lb 10.1 oz), SpO2 100 %. PHYSICAL EXAMINATION:  GENERAL:  84 y.o.-year-old patient lying in the bed with no acute distress.  EYES: Pupils equal, round, reactive to light and accommodation. No scleral icterus. Extraocular muscles intact.  HEENT: Head atraumatic, normocephalic. Oropharynx and nasopharynx clear.  NECK:  Supple, no jugular venous distention. No thyroid enlargement, no tenderness.  LUNGS: Normal breath sounds bilaterally, no wheezing, rales,rhonchi or crepitation. No use of accessory muscles of respiration.  CARDIOVASCULAR: S1, S2 normal. No murmurs, rubs, or gallops.  ABDOMEN: Soft, non-tender, non-distended. Bowel sounds present. No organomegaly or mass.  EXTREMITIES: Trace LE edema R > L, cyanosis, or clubbing.  NEUROLOGIC: Cranial nerves II through XII are intact. Muscle  strength 5/5 in all extremities. Sensation intact. Gait not checked.  PSYCHIATRIC: The patient is alert and oriented x 3.  SKIN: No obvious rash, lesion, or ulcer.  DATA REVIEW:   CBC Recent Labs  Lab 03/16/18 0402  WBC 7.7  HGB 8.2*  HCT 24.2*  PLT 400    Chemistries  Recent Labs  Lab 03/12/18 0707  03/16/18 0402  NA 138   < > 143  K 3.8   < > 3.5  CL 108   < > 113*  CO2 25   < > 24  GLUCOSE 179*   < > 143*  BUN 66*   < > 40*  CREATININE 1.22*   < > 0.98  CALCIUM 7.2*   < > 7.4*  MG 2.1  --   --   AST  --    < > 18  ALT  --    < > 10  ALKPHOS  --    < > 44  BILITOT  --    < > 0.6   < > = values in this interval not displayed.     Microbiology Results  Results for orders placed or performed during the hospital encounter of 03/01/18  MRSA PCR Screening     Status: None   Collection Time: 03/02/18  6:25 AM  Result Value Ref Range Status   MRSA by PCR NEGATIVE NEGATIVE Final    Comment:        The GeneXpert MRSA Assay (FDA approved for NASAL specimens only), is one component of a comprehensive MRSA colonization surveillance program. It is not intended to diagnose MRSA infection nor to guide or monitor treatment for MRSA infections. Performed at [facility], [address] [place], [place] [address]     RADIOLOGY:  US Venous Img Lower Unilateral Right  Result Date: 03/15/2018 CLINICAL DATA:  Edema x2 weeks. Right inguinal hernia repair 03/09/2018. EXAM: RIGHT LOWER EXTREMITY VENOUS DOPPLER ULTRASOUND TECHNIQUE: Gray-scale sonography with compression, as well as color and duplex ultrasound, were performed to evaluate the deep venous system from the level of the common femoral vein through the popliteal and proximal calf veins. COMPARISON:  None FINDINGS: Normal compressibility of the common femoral, superficial femoral, and popliteal veins, as well as the proximal calf veins. No filling defects to suggest DVT on grayscale or color Doppler imaging.  Doppler waveforms show normal direction of venous flow, normal respiratory phasicity and response to augmentation. Complex 7.8 x 4.8 cm echogenic process in the deep subcutaneous tissues overlying the common femoral vessels. There is subcutaneous edema in the right thigh. Visualized segments of the saphenous venous system normal in caliber and compressibility. Survey views of the  contralateral common femoral vein are unremarkable. IMPRESSION: 1.  No evidence of right lower extremity deep vein thrombosis. 2. 7.8 cm process overlying the right common femoral vessels possibly hematoma less likely residual/recurrent hernia, causing mild mass effect upon the right common femoral vein. Electronically Signed   By: Lucrezia Europe M.D.   On: 03/15/2018 10:35     Management plans discussed with the patient, family and they are in agreement.  CODE STATUS: Full Code   TOTAL TIME TAKING CARE OF THIS PATIENT: 35 minutes.    Berna Spare Kieth Hartis M.D on 03/16/2018 at 9:36 AM  Between 7am to 6pm - Pager - [phone]  After 6pm go to [email or web address] - Proofreader  [employer]  Office  [phone]  CC: Primary care physician; Einar Pheasant, MD   Note: This dictation was prepared with Dragon dictation along with smaller phrase technology. Any transcriptional errors that result from this process are unintentional.

## 2018-03-17 ENCOUNTER — Ambulatory Visit: Payer: Self-pay

## 2018-03-17 DIAGNOSIS — E1151 Type 2 diabetes mellitus with diabetic peripheral angiopathy without gangrene: Secondary | ICD-10-CM | POA: Diagnosis not present

## 2018-03-17 DIAGNOSIS — R1084 Generalized abdominal pain: Secondary | ICD-10-CM | POA: Diagnosis not present

## 2018-03-17 DIAGNOSIS — I10 Essential (primary) hypertension: Secondary | ICD-10-CM | POA: Diagnosis not present

## 2018-03-17 DIAGNOSIS — R600 Localized edema: Secondary | ICD-10-CM | POA: Diagnosis not present

## 2018-03-23 ENCOUNTER — Other Ambulatory Visit
Admission: RE | Admit: 2018-03-23 | Discharge: 2018-03-23 | Disposition: A | Discharge status: Home / Self Care | Payer: Medicare Other | Source: Ambulatory Visit | Attending: Internal Medicine | Admitting: Internal Medicine

## 2018-03-23 DIAGNOSIS — R609 Edema, unspecified: Secondary | ICD-10-CM | POA: Insufficient documentation

## 2018-03-23 LAB — BASIC METABOLIC PANEL
Anion gap: 7 (ref 5–15)
BUN: 24 mg/dL — ABNORMAL HIGH (ref 8–23)
CHLORIDE: 107 mmol/L (ref 98–111)
CO2: 29 mmol/L (ref 22–32)
Calcium: 7.7 mg/dL — ABNORMAL LOW (ref 8.9–10.3)
Creatinine, Ser: 0.99 mg/dL (ref 0.44–1.00)
GFR calc non Af Amer: 46 mL/min — ABNORMAL LOW (ref >60)
GFR, EST AFRICAN AMERICAN: 53 mL/min — AB (ref >60)
Glucose, Bld: 120 mg/dL — ABNORMAL HIGH (ref 70–99)
POTASSIUM: 3.2 mmol/L — AB (ref 3.5–5.1)
SODIUM: 143 mmol/L (ref 135–145)

## 2018-03-24 ENCOUNTER — Other Ambulatory Visit: Payer: Self-pay | Admitting: Surgery

## 2018-03-24 ENCOUNTER — Non-Acute Institutional Stay (SKILLED_NURSING_FACILITY): Payer: Medicare Other | Admitting: Adult Health

## 2018-03-24 ENCOUNTER — Encounter: Payer: Self-pay | Admitting: Adult Health

## 2018-03-24 DIAGNOSIS — M25461 Effusion, right knee: Secondary | ICD-10-CM

## 2018-03-24 DIAGNOSIS — N185 Chronic kidney disease, stage 5: Secondary | ICD-10-CM | POA: Diagnosis not present

## 2018-03-24 DIAGNOSIS — K219 Gastro-esophageal reflux disease without esophagitis: Secondary | ICD-10-CM

## 2018-03-24 DIAGNOSIS — J309 Allergic rhinitis, unspecified: Secondary | ICD-10-CM | POA: Diagnosis not present

## 2018-03-24 DIAGNOSIS — I120 Hypertensive chronic kidney disease with stage 5 chronic kidney disease or end stage renal disease: Secondary | ICD-10-CM | POA: Diagnosis not present

## 2018-03-24 DIAGNOSIS — E034 Atrophy of thyroid (acquired): Secondary | ICD-10-CM

## 2018-03-24 DIAGNOSIS — E1151 Type 2 diabetes mellitus with diabetic peripheral angiopathy without gangrene: Secondary | ICD-10-CM

## 2018-03-24 DIAGNOSIS — D631 Anemia in chronic kidney disease: Secondary | ICD-10-CM

## 2018-03-24 DIAGNOSIS — K4190 Unilateral femoral hernia, without obstruction or gangrene, not specified as recurrent: Secondary | ICD-10-CM

## 2018-03-24 DIAGNOSIS — E43 Unspecified severe protein-calorie malnutrition: Secondary | ICD-10-CM

## 2018-03-24 DIAGNOSIS — K5909 Other constipation: Secondary | ICD-10-CM

## 2018-03-24 DIAGNOSIS — I160 Hypertensive urgency: Secondary | ICD-10-CM

## 2018-03-24 NOTE — Progress Notes (Signed)
Location:   [facility] Room Number: 205A Place of Service:  SNF (31)   CODE STATUS: DNR  Allergies  Allergen Reactions  . Ace Inhibitors Other (See Comments)    Angioedema  . Hydrochlorothiazide W-Triamterene Other (See Comments)    Gout flare  . Lisinopril Swelling and Other (See Comments)  . Penicillins Swelling  . Trichlormethiazide Other (See Comments)    Gout flare    Chief Complaint  Patient presents with  . Medical Management of Chronic Issues    Hypothyroidism; hypertensive kidney disease; malnutrition weekly follow up for the first 30 days post hospitalization     HPI:  She is a 84 year old long term resident of this facility being seen for the management of her chronic illnesses: hypothyroidism; hypertensive kidney disease; malnutrition. She has recently been hospitalized for abdominal pain due to incarcerated femoral hernia. She did have a laparotomy and LOA with incarcerated femoral hernia repair with mesh on 03-09-18. She denies any uncontrolled pain; no change in appetite; no insomnia. There are no nursing concerns at this time.   Past Medical History:  Diagnosis Date  . Asthma   . Diabetes mellitus ([place])   . Environmental allergies   . Hypertension     Past Surgical History:  Procedure Laterality Date  . APPENDECTOMY    . INGUINAL HERNIA REPAIR  03/09/2018   Procedure: HERNIA REPAIR INGUINAL INCARCERATED;  Surgeon: Benjamine Sprague, DO;  Location: [facility] ORS;  Service: General;;  . INSERTION OF MESH  03/09/2018   Procedure: INSERTION OF MESH;  Surgeon: Benjamine Sprague, DO;  Location: [facility] ORS;  Service: General;;  . LAPAROSCOPY N/A 03/09/2018   Procedure: LAPAROSCOPY DIAGNOSTIC;  Surgeon: Benjamine Sprague, DO;  Location: [facility] ORS;  Service: General;  Laterality: N/A;  . TUBAL LIGATION      Social History   Socioeconomic History  . Marital status: Widowed    Spouse name: Not on file  . Number of children: Not on file  . Years of  education: Not on file  . Highest education level: Not on file  Occupational History  . Not on file  Social Needs  . Financial resource strain: Not on file  . Food insecurity:    Worry: Not on file    Inability: Not on file  . Transportation needs:    Medical: Not on file    Non-medical: Not on file  Tobacco Use  . Smoking status: Never Smoker  . Smokeless tobacco: Never Used  Substance and Sexual Activity  . Alcohol use: No    Alcohol/week: 0.0 standard drinks  . Drug use: No  . Sexual activity: Never  Lifestyle  . Physical activity:    Days per week: Not on file    Minutes per session: Not on file  . Stress: Not on file  Relationships  . Social connections:    Talks on phone: Not on file    Gets together: Not on file    Attends religious service: Not on file    Active member of club or organization: Not on file    Attends meetings of clubs or organizations: Not on file    Relationship status: Not on file  . Intimate partner violence:    Fear of current or ex partner: Not on file    Emotionally abused: Not on file    Physically abused: Not on file    Forced sexual activity: Not on file  Other Topics Concern  . Not on  file  Social History Narrative  . Not on file   Family History  Problem Relation Age of Onset  . Hypertension Mother   . Cancer Brother        lung (question)  . Cancer Sister        questionable type      VITAL SIGNS BP (!) 174/62   Pulse 69   Temp 98.3 F (36.8 C) (Oral)   Resp 18   Ht 4\' 11"  (1.499 m)   Wt 94 lb 6.4 oz (42.8 kg)   SpO2 99%   BMI 19.07 kg/m   Outpatient Encounter Medications as of 03/24/2018  Medication Sig  . acetaminophen (TYLENOL) 325 MG tablet Take 650 mg by mouth every 6 (six) hours as needed for mild pain.   . Amino Acids-Protein Hydrolys (FEEDING SUPPLEMENT, PRO-STAT SUGAR FREE 64,) LIQD Take 30 mLs by mouth 2 (two) times daily between meals.  Marland Kitchen amLODipine (NORVASC) 5 MG tablet Take 5 mg by mouth daily.  Marland Kitchen  aspirin (ASPIRIN 81) 81 MG EC tablet Take 81 mg by mouth daily. Swallow whole.  . Calcium Carbonate-Vitamin D (CALTRATE 600+D PO) Take 1 tablet by mouth daily.   . cloNIDine (CATAPRES - DOSED IN MG/24 HR) 0.2 mg/24hr patch Place 1 patch (0.2 mg total) onto the skin once a week.  . diclofenac sodium (VOLTAREN) 1 % GEL Apply 4 g topically 3 (three) times daily as needed. Apply to the Right Knee for effusion  . famotidine (PEPCID) 10 MG tablet Take 10 mg by mouth 2 (two) times daily.  . feeding supplement, ENSURE ENLIVE, (ENSURE ENLIVE) LIQD Take 237 mLs by mouth 2 (two) times daily between meals.  . fexofenadine (ALLEGRA) 180 MG tablet Take 1 tablet (180 mg total) by mouth daily.  . fluticasone (FLONASE) 50 MCG/ACT nasal spray Place 1 spray into both nostrils daily.   Marland Kitchen glucose blood (ACCU-CHEK AVIVA PLUS) test strip CHECK SUGAR ONCE DAILY  . hydrALAZINE (APRESOLINE) 50 MG tablet TAKE 1 TABLET BY MOUTH 3 TIMES A DAY  . metoprolol tartrate (LOPRESSOR) 25 MG tablet Take 1 tablet (25 mg total) by mouth 2 (two) times daily.  . polyethylene glycol (MIRALAX / GLYCOLAX) packet Take 17 g by mouth daily.  . potassium chloride (K-DUR) 10 MEQ tablet Take 10 mEq by mouth daily.  Marland Kitchen senna-docusate (SENOKOT-S) 8.6-50 MG tablet Take 2 tablets by mouth 2 (two) times daily.  Marland Kitchen SYNTHROID 50 MCG tablet TAKE ONE (1) TABLET EACH DAY  . torsemide (DEMADEX) 10 MG tablet Take 10 mg by mouth daily.  Marland Kitchen UNABLE TO FIND Diet order: Downgrade patient to dysphagia 2 diet, add gravy to all trays; continue thin liquids  . [DISCONTINUED] famotidine (PEPCID) 10 MG tablet Take 1 tablet (10 mg total) by mouth daily.   No facility-administered encounter medications on file as of 03/24/2018.      SIGNIFICANT DIAGNOSTIC EXAMS  TODAY:   03-01-18: ct angio of abdomen and pelvis:  VASCULAR Negative for aortic dissection or acute aortic occlusive disease. Moderate severe aortic atherosclerosis with stenosis of branch vessels in the  abdomen and pelvis as detailed above. NON-VASCULAR 1. Borderline fluid-filled enlarged loops distal small bowelprimarily within the lower abdomen and pelvis without well-defined transition point. Findings could be secondary to ileus or low-grade obstruction. No free air. 2. Gallstones 3. Small amount of free fluid in the pelvis 4. Cardiomegaly  03-02-18: right upper quad ultrasound:  1. Small stones and sludge in the gallbladder. No evidence for acute cholecystitis  or biliary dilatation. 2. Trace amount of fluid adjacent to the liver and right kidney 3. Small cysts within the liver  03-15-18: right lower extremity venous doppler:  1.  No evidence of right lower extremity deep vein thrombosis. 2. 7.8 cm process overlying the right common femoral vessels possibly hematoma less likely residual/recurrent hernia, causing mild mass effect upon the right common femoral vein.  LABS REVIEWED TODAY:   12-02-17: vit B 12: 306; iron 23; transferrin 188.0 03-01-18: wbc 7.0; hgb 12.1; hct 36.2; mcv 91.7; plt 257 glucose 234; bun 31; creat 0.96; k+ 4.4; na++ 139; ca 8.9 liver normal albumin 3.4 03-03-18: pre-albumin  Glucose 160; bun 30; creat 1.04 ;k+ 4.3; na++ 142; ca 8.5  03-05-18: wbc 9.9; hgb 10.1; hct 29.6; mcv 92.8; plt 232 glucose 114; bun 34; creat 1.14; k+ 3.6; na++ 143; ca 7.9 mag 2.3 phos 4.2  liver normal albumin 2.0 03-06-18: pre-albumin 11.2  03-11-18: wbc 12.6; hgb 8.6; hct 25.2; mcv 92.1; plt 296 glucose 202; bun 71; creat 1.42; k+ 4.4; na++ 140; ca 7.2 phos 4.0 mag 2.5  03-16-18: wbc 7.7; hgb 8.2; hct 24.2; mcv 91.3; plt 400 glucose 143; bun 40; creat 0.98; k+ 3.5; na++ 143; ca 7.4;  Liver normal albumin 1.8   Review of Systems  Constitutional: Negative for malaise/fatigue.  Respiratory: Negative for cough and shortness of breath.   Cardiovascular: Negative for chest pain, palpitations and leg swelling.  Gastrointestinal: Negative for abdominal pain, constipation and heartburn.    Musculoskeletal: Negative for back pain, joint pain and myalgias.  Skin: Negative.   Neurological: Negative for dizziness.  Psychiatric/Behavioral: The patient is not nervous/anxious.     Physical Exam  Constitutional: She is oriented to person, place, and time. She appears well-developed and well-nourished. No distress.  Neck: No thyromegaly present.  Cardiovascular: Normal rate, regular rhythm, normal heart sounds and intact distal pulses.  Pulmonary/Chest: Effort normal and breath sounds normal. No respiratory distress.  Abdominal: Soft. Bowel sounds are normal. She exhibits no distension. There is no tenderness.  Musculoskeletal: Normal range of motion.  Trace bilateral lower extremity edema   Lymphadenopathy:    She has no cervical adenopathy.  Neurological: She is alert and oriented to person, place, and time.  Skin: Skin is warm and dry. She is not diaphoretic.  Psychiatric: She has a normal mood and affect.     ASSESSMENT/ PLAN:  TODAY:   1.  Hypothyroidism due to acquired atrophy of thyroid: is stable tsh 4.72 (08-10-17): will continue synthroid 50 mcg daily   2. Hypertensive kidney disease with CKD (chronic kidney disease) stage V; is stable bun 40; creat 0.98  3. Bilateral lower extremity edema: is stable will continue demadex 10 mg daily with k+ 10 meq daily   4. Protein-calorie malnutrition, severe: is stable albumin 1.8; weight is 94 pounds; will continue supplement as directed   5. Anemia due to stage V ckd not on hemodialysis: is stable hgb 8.2   6. Allergic rhinitis: is stable will continue allegra 180 mg daily flonase daily   7. Chronic constipation: stable will continue miralax daily senna s 2 tabs twice daily   8. GERD without esophagitis: is stable will continue pepcid 10 mg twice daily   9. Right knee effusion: is stable will continue voltaren gel 4 gm to right knee three times daily as needed   10. Hypertensive  urgency malignant: is without  change: will continue asa 81 mg daily  norvasc 5 mg daily clonidine 0.2  mg weekly patch; apresoline 50 mg three times daily and lopressor 25 mg twice daily   11. Diabetes mellitus with peripheral vascular disease: is stable will continue asa 81 mg daily    MD is aware of resident's narcotic use and is in agreement with current plan of care. We will attempt to wean resident as apropriate   Ok Edwards NP [facility]  Contact [phone] Monday through Friday 8am- 5pm  After hours call [phone]

## 2018-03-31 ENCOUNTER — Encounter: Payer: Self-pay | Admitting: Internal Medicine

## 2018-03-31 ENCOUNTER — Non-Acute Institutional Stay (SKILLED_NURSING_FACILITY): Payer: Medicare Other | Admitting: Internal Medicine

## 2018-03-31 DIAGNOSIS — N185 Chronic kidney disease, stage 5: Secondary | ICD-10-CM

## 2018-03-31 DIAGNOSIS — E1151 Type 2 diabetes mellitus with diabetic peripheral angiopathy without gangrene: Secondary | ICD-10-CM | POA: Diagnosis not present

## 2018-03-31 DIAGNOSIS — I10 Essential (primary) hypertension: Secondary | ICD-10-CM

## 2018-03-31 DIAGNOSIS — E43 Unspecified severe protein-calorie malnutrition: Secondary | ICD-10-CM | POA: Diagnosis not present

## 2018-03-31 DIAGNOSIS — I120 Hypertensive chronic kidney disease with stage 5 chronic kidney disease or end stage renal disease: Secondary | ICD-10-CM | POA: Diagnosis not present

## 2018-03-31 DIAGNOSIS — E039 Hypothyroidism, unspecified: Secondary | ICD-10-CM

## 2018-03-31 DIAGNOSIS — D649 Anemia, unspecified: Secondary | ICD-10-CM

## 2018-03-31 NOTE — Progress Notes (Signed)
Location:  [place] [facility] Room Number: [place] of Service:  [phone] Provider:  Janalyn Harder, MD  Patient Care Team: Einar Pheasant, MD as PCP - General (Internal Medicine)  Extended Emergency Contact Information Primary Emergency Contact: Ardell Isaacs, [place] [address] Johnnette Litter of [place] Phone: [phone] Mobile Phone: [phone] Relation: Daughter Secondary Emergency Contact: [name] Address: [address] [place]          [place], [place] [address] Home Phone: [phone] Mobile Phone: [phone] Relation: Daughter  Code Status:  DNR Goals of care: Advanced Directive information Advanced Directives 03/31/2018  Does Patient Have a Medical Advance Directive? Yes  Type of Advance Directive Out of facility DNR (pink MOST or yellow form)  Does patient want to make changes to medical advance directive? No - Patient declined  Would patient like information on creating a medical advance directive? -  Pre-existing out of facility DNR order (yellow form or pink MOST form) Yellow form placed in chart (order not valid for inpatient use)     Chief Complaint  Patient presents with  . Medical Management of Chronic Issues    Weekly follow-up for the first 30 days post hospitalization    HPI:  Pt is a 84 y.o. female seen today for medical management of chronic diseases. And elevated Blood pressure Patient has a history of diabetes, hypertension, hypothyroidism  She was admitted in the hospital from 07/14-07/22 for abdominal pain.  She was found to have a incarcerated femoral hernia.  Since she failed conservative treatment she underwent laparoscopic Lysis of adhesions and Femoral hernia repair.  She also had hypertensive emergency and was treated with IV nicardipine post surgery.  She was eventually changed to hydralazine and Lopressor Patient was also started on clonidine patch Patient is doing well in  facility but her Systolic blood pressure has been running more than 180 . Denies any chest pain shortness of breath.  She denies any abdominal pain. She is working with therapy and is able to walk with assist Past Medical History:  Diagnosis Date  . Asthma   . Diabetes mellitus ([place])   . Environmental allergies   . Hypertension    Past Surgical History:  Procedure Laterality Date  . APPENDECTOMY    . INGUINAL HERNIA REPAIR  03/09/2018   Procedure: HERNIA REPAIR INGUINAL INCARCERATED;  Surgeon: Benjamine Sprague, DO;  Location: [facility] ORS;  Service: General;;  . INSERTION OF MESH  03/09/2018   Procedure: INSERTION OF MESH;  Surgeon: Benjamine Sprague, DO;  Location: [facility] ORS;  Service: General;;  . LAPAROSCOPY N/A 03/09/2018   Procedure: LAPAROSCOPY DIAGNOSTIC;  Surgeon: Benjamine Sprague, DO;  Location: [facility] ORS;  Service: General;  Laterality: N/A;  . TUBAL LIGATION      Allergies  Allergen Reactions  . Ace Inhibitors Other (See Comments)    Angioedema  . Hydrochlorothiazide W-Triamterene Other (See Comments)    Gout flare  . Lisinopril Swelling and Other (See Comments)  . Penicillins Swelling  . Trichlormethiazide Other (See Comments)    Gout flare    Outpatient Encounter Medications as of 03/31/2018  Medication Sig  . acetaminophen (TYLENOL) 325 MG tablet Take 650 mg by mouth every 6 (six) hours as needed for mild pain.   . Amino Acids-Protein Hydrolys (FEEDING SUPPLEMENT, PRO-STAT SUGAR FREE 64,) LIQD Take 30 mLs by mouth 2 (two) times daily between meals.  Marland Kitchen amLODipine (NORVASC) 5 MG tablet  Take 5 mg by mouth daily.   Marland Kitchen aspirin (ASPIRIN 81) 81 MG EC tablet Take 81 mg by mouth daily. Swallow whole.  . Calcium Carbonate-Vitamin D (CALTRATE 600+D PO) Take 1 tablet by mouth daily.   . cloNIDine (CATAPRES - DOSED IN MG/24 HR) 0.2 mg/24hr patch Place 1 patch (0.2 mg total) onto the skin once a week.  . diclofenac sodium (VOLTAREN) 1 % GEL Apply 4 g topically 3 (three) times daily as needed. Apply  to the Right Knee for effusion  . famotidine (PEPCID) 10 MG tablet Take 10 mg by mouth 2 (two) times daily.  . feeding supplement, ENSURE ENLIVE, (ENSURE ENLIVE) LIQD Take 237 mLs by mouth 2 (two) times daily between meals.  . fexofenadine (ALLEGRA) 180 MG tablet Take 1 tablet (180 mg total) by mouth daily.  . fluticasone (FLONASE) 50 MCG/ACT nasal spray Place 1 spray into both nostrils daily.   Marland Kitchen glucose blood (ACCU-CHEK AVIVA PLUS) test strip CHECK SUGAR ONCE DAILY  . hydrALAZINE (APRESOLINE) 50 MG tablet TAKE 1 TABLET BY MOUTH 3 TIMES A DAY  . metoprolol tartrate (LOPRESSOR) 25 MG tablet Take 1 tablet (25 mg total) by mouth 2 (two) times daily.  . polyethylene glycol (MIRALAX / GLYCOLAX) packet Take 17 g by mouth daily.  . potassium chloride (K-DUR) 10 MEQ tablet Take 10 mEq by mouth daily.  Marland Kitchen senna-docusate (SENOKOT-S) 8.6-50 MG tablet Take 2 tablets by mouth 2 (two) times daily.  Marland Kitchen SYNTHROID 50 MCG tablet TAKE ONE (1) TABLET EACH DAY  . torsemide (DEMADEX) 10 MG tablet Take 10 mg by mouth daily.  Marland Kitchen UNABLE TO FIND Diet order: Downgrade patient to dysphagia 2 diet, add gravy to all trays; continue thin liquids   No facility-administered encounter medications on file as of 03/31/2018.     Review of Systems  Review of Systems  Constitutional: Negative for activity change, appetite change, chills, diaphoresis, fatigue and fever.  HENT: Negative for mouth sores, postnasal drip, rhinorrhea, sinus pain and sore throat.   Respiratory: Negative for apnea, cough, chest tightness, shortness of breath and wheezing.   Cardiovascular: Negative for chest pain, palpitations and leg swelling.  Gastrointestinal: Negative for abdominal distention, abdominal pain, constipation, diarrhea, nausea and vomiting.  Genitourinary: Negative for dysuria and frequency.  Musculoskeletal: Negative for arthralgias, joint swelling and myalgias.  Skin: Negative for rash.  Neurological: Negative for dizziness, syncope,  weakness, light-headedness and numbness.  Psychiatric/Behavioral: Negative for behavioral problems, confusion and sleep disturbance.     Immunization History  Administered Date(s) Administered  . Influenza, High Dose Seasonal PF 05/20/2016, 05/20/2017  . Influenza,inj,Quad PF,6+ Mos 05/12/2013, 04/26/2014, 04/10/2015  . PPD Test 03/16/2018  . Pneumococcal Conjugate-13 09/12/2014  . Pneumococcal Polysaccharide-23 03/10/2017   Pertinent  Health Maintenance Due  Topic Date Due  . FOOT EXAM  02/11/1928  . INFLUENZA VACCINE  03/11/2018  . HEMOGLOBIN A1C  05/03/2018  . OPHTHALMOLOGY EXAM  05/06/2018  . DEXA SCAN  Completed  . PNA vac Low Risk Adult  Completed   Fall Risk  08/10/2017 11/06/2016 06/19/2016 05/20/2016 06/23/2014  Falls in the past year? No No Yes No Yes  Number falls in past yr: - - 1 - 2 or more  Injury with Fall? - - No - -  Risk Factor Category  - - - - -  Risk for fall due to : - - - - -   Functional Status Survey:    Vitals:   03/31/18 1100  BP: (!) 185/53  Pulse: 65  Resp: 20  Temp: 98.1 F (36.7 C)  TempSrc: Oral  SpO2: 100%  Weight: 87 lb (39.5 kg)  Height: 4\' 11"  (1.499 m)   Body mass index is 17.57 kg/m. Physical Exam  Constitutional: Oriented to person, place, and time. Well-developed and well-nourished.  HENT:  Head: Normocephalic.  Mouth/Throat: Oropharynx is clear and moist.  Eyes: Pupils are equal, round, and reactive to light.  Neck: Neck supple.  Cardiovascular: Normal rate and normal heart sounds.  No murmur heard. Pulmonary/Chest: Effort normal and breath sounds normal. No respiratory distress. No wheezes. She has no rales.  Abdominal: Soft. Bowel sounds are normal. No distension. There is no tenderness. There is no rebound. Surgical incision looks clean  like healing Musculoskeletal: Trace edema. Right More then left Lymphadenopathy: none Neurological: Alert and oriented to person, place, and time.  Skin: Skin is warm and dry.    Psychiatric: Normal mood and affect. Behavior is normal. Thought content normal.    Labs reviewed: Recent Labs    03/10/18 0503 03/11/18 0328 03/12/18 0707  03/15/18 0343 03/16/18 0402 03/23/18 0817  NA 139 140 138   < > 141 143 143  K 4.8 4.4 3.8   < > 3.9 3.5 3.2*  CL 107 107 108   < > 110 113* 107  CO2 27 26 25    < > 26 24 29   GLUCOSE 227* 202* 179*   < > 131* 143* 120*  BUN 62* 71* 66*   < > 55* 40* 24*  CREATININE 1.42* 1.42* 1.22*   < > 1.28* 0.98 0.99  CALCIUM 7.3* 7.2* 7.2*   < > 7.4* 7.4* 7.7*  MG 2.3 2.5* 2.1  --   --   --   --   PHOS 5.4* 4.0 2.7  --   --   --   --    < > = values in this interval not displayed.   Recent Labs    03/11/18 0328 03/15/18 0343 03/16/18 0402  AST 17 19 18   ALT 9 9 10   ALKPHOS 46 44 44  BILITOT 0.3 0.6 0.6  PROT 4.9* 5.3* 5.4*  ALBUMIN 1.7* 1.8* 1.8*   Recent Labs    03/10/18 0408 03/11/18 0328  03/14/18 0554 03/15/18 0343 03/16/18 0402  WBC 11.6* 12.6*   < > 11.0 9.9 7.7  NEUTROABS 10.7* 9.9*  --   --  7.3*  --   HGB 8.8* 8.6*   < > 8.1* 8.0* 8.2*  HCT 27.1* 25.2*   < > 23.5* 23.2* 24.2*  MCV 94.4 92.1   < > 91.5 91.1 91.3  PLT 275 296   < > 321 368 400   < > = values in this interval not displayed.   Lab Results  Component Value Date   TSH 1.765 10/31/2017   Lab Results  Component Value Date   HGBA1C 6.4 (H) 10/31/2017   Lab Results  Component Value Date   CHOL 209 (H) 06/21/2014   HDL 77.00 06/21/2014   LDLCALC 111 (H) 06/21/2014   LDLDIRECT 107.1 07/29/2013   TRIG 112 03/08/2018   CHOLHDL 3 06/21/2014    Significant Diagnostic Results in last 30 days:  Dg Chest 1 View  Result Date: 03/11/2018 CLINICAL DATA:  Worsening cough. EXAM: CHEST  1 VIEW COMPARISON:  03/08/2018. FINDINGS: Right PICC line noted with tip over superior vena cava. Cardiomegaly with pulmonary venous congestion, bilateral pulmonary infiltrates/edema, bilateral pleural effusions. No interim change. IMPRESSION: 1.  Right  PICC line noted  with tip over superior vena cava. 2. Cardiomegaly with pulmonary venous congestion, bilateral pulmonary infiltrates/edema, bilateral pleural effusions. 3.  Bibasilar atelectasis. Electronically Signed   By: Marcello Moores  Register   On: 03/11/2018 09:29   Dg Chest 1 View  Result Date: 03/06/2018 CLINICAL DATA:  Congestive heart failure. EXAM: CHEST  1 VIEW COMPARISON:  One-view chest x-ray 03/05/2018 FINDINGS: Heart is enlarged. NG tube terminates in the stomach. A right-sided PICC line is stable. Interstitial edema and bilateral pleural effusions is stable. Bibasilar airspace disease likely reflects atelectasis. IMPRESSION: 1. Stable appearance of cardiomegaly with interstitial edema and bilateral effusions compatible with congestive heart failure. Electronically Signed   By: San Morelle M.D.   On: 03/06/2018 07:18   Dg Chest 1 View  Result Date: 03/05/2018 CLINICAL DATA:  Fever EXAM: CHEST  1 VIEW COMPARISON:  03/03/2018 FINDINGS: Cardiac enlargement with vascular congestion. Progressive vascular congestion and bilateral edema. Bibasilar airspace disease and small effusions left greater than right with mild progression from the prior study. NG tube in the stomach IMPRESSION: Progression of vascular congestion and edema. Progressive atelectasis and effusion left greater than right. Electronically Signed   By: Franchot Gallo M.D.   On: 03/05/2018 09:20   Dg Abd 1 View  Result Date: 03/09/2018 CLINICAL DATA:  Small bowel obstruction. Enteric contrast administered 8 hours prior. EXAM: ABDOMEN - 1 VIEW COMPARISON:  Abdominal radiograph yesterday. FINDINGS: Administered enteric contrast within the stomach and small bowel. No visualized enteric contrast in the colon. Enteric tube remains in place tip in the stomach. Scoliotic curvature of spine. There are vascular calcifications. IMPRESSION: Administered enteric contrast within the stomach and small bowel. No enteric contrast has reached the colon.  Recommend 24 hour film post contrast administration (1555 hour on 03/09/2018). Electronically Signed   By: Jeb Levering M.D.   On: 03/09/2018 01:59   Dg Abd 1 View  Result Date: 03/07/2018 CLINICAL DATA:  Follow up small bowel obstruction EXAM: ABDOMEN - 1 VIEW COMPARISON:  03/06/2018 FINDINGS: Nasogastric catheter is noted coiled within the stomach. Scattered large and small bowel gas is noted. Some mildly prominent loops of small bowel are noted in the mid abdomen relatively stable from the prior exam. No free air is seen. Degenerative changes of lumbar spine are noted. IMPRESSION: Mild gaseous distension of a few small bowel loops within the mid abdomen. Electronically Signed   By: Inez Catalina M.D.   On: 03/07/2018 07:03   Dg Abd 1 View  Result Date: 03/05/2018 CLINICAL DATA:  Ileus EXAM: ABDOMEN - 1 VIEW COMPARISON:  03/04/2018 FINDINGS: Enteric tube is looped in the left upper quadrant consistent with location in the body of the stomach. Decreasing gaseous distention of small bowel since previous study. Left upper quadrant small bowel remains distended. Paucity of gas in the colon with stool in the right colon. No radiopaque stones. Degenerative changes in the lumbar spine with lumbar scoliosis convex towards the right. Vascular calcifications. Old fractures of the left superior and right inferior pubic rami. IMPRESSION: Decreasing gaseous distention of small bowel since previous study. Enteric tube is unchanged in position. Electronically Signed   By: Lucienne Capers M.D.   On: 03/05/2018 04:58   Dg Abd 1 View  Result Date: 03/05/2018 CLINICAL DATA:  NG tube placement EXAM: ABDOMEN - 1 VIEW COMPARISON:  03/04/2018, 03/03/2018 FINDINGS: Pleural effusions and bibasilar airspace disease. Esophageal tube is looped back upon itself with the tip positioned near the GE junction. Gaseous enlargement  of small bowel up to 3 cm. Vascular calcifications. IMPRESSION: 1. Esophageal tube is looped upon  itself in the stomach with the tip projecting near the GE junction and directed cephalad. 2. Dilated loops of small bowel within the central abdomen and pelvis 3. Pleural effusions and basilar consolidations. Electronically Signed   By: Donavan Foil M.D.   On: 03/05/2018 00:46   Dg Abd 1 View  Result Date: 03/03/2018 CLINICAL DATA:  NG tube placement. EXAM: ABDOMEN - 1 VIEW COMPARISON:  CT abdomen pelvis dated March 01, 2018. FINDINGS: NG tube within the stomach. Mildly dilated loops of air-filled small bowel in the central abdomen. No pneumoperitoneum. No acute osseous abnormality. IMPRESSION: 1. NG tube within the stomach. 2. Unchanged small bowel ileus/obstruction. Electronically Signed   By: Titus Dubin M.D.   On: 03/03/2018 15:59   US Venous Img Lower Unilateral Right  Result Date: 03/15/2018 CLINICAL DATA:  Edema x2 weeks. Right inguinal hernia repair 03/09/2018. EXAM: RIGHT LOWER EXTREMITY VENOUS DOPPLER ULTRASOUND TECHNIQUE: Gray-scale sonography with compression, as well as color and duplex ultrasound, were performed to evaluate the deep venous system from the level of the common femoral vein through the popliteal and proximal calf veins. COMPARISON:  None FINDINGS: Normal compressibility of the common femoral, superficial femoral, and popliteal veins, as well as the proximal calf veins. No filling defects to suggest DVT on grayscale or color Doppler imaging. Doppler waveforms show normal direction of venous flow, normal respiratory phasicity and response to augmentation. Complex 7.8 x 4.8 cm echogenic process in the deep subcutaneous tissues overlying the common femoral vessels. There is subcutaneous edema in the right thigh. Visualized segments of the saphenous venous system normal in caliber and compressibility. Survey views of the contralateral common femoral vein are unremarkable. IMPRESSION: 1.  No evidence of right lower extremity deep vein thrombosis. 2. 7.8 cm process overlying the right  common femoral vessels possibly hematoma less likely residual/recurrent hernia, causing mild mass effect upon the right common femoral vein. Electronically Signed   By: Lucrezia Europe M.D.   On: 03/15/2018 10:35   Dg Chest Port 1 View  Result Date: 03/08/2018 CLINICAL DATA:  Shortness of breath EXAM: PORTABLE CHEST 1 VIEW COMPARISON:  03/06/2018 FINDINGS: Cardiomegaly. Small bilateral pleural effusions. No frank interstitial edema. No pneumothorax. Right arm PICC terminates cavoatrial junction. Enteric tube terminates in the proximal stomach. IMPRESSION: Cardiomegaly with small bilateral pleural effusions. No frank interstitial edema. Electronically Signed   By: Julian Hy M.D.   On: 03/08/2018 02:19   Dg Chest Port 1 View  Result Date: 03/03/2018 CLINICAL DATA:  Fever and hypoxia. EXAM: PORTABLE CHEST 1 VIEW COMPARISON:  Chest x-ray dated October 30, 2017. FINDINGS: The patient is rotated to the right. NG tube within the stomach. Stable cardiomegaly. Normal pulmonary vascularity. Atherosclerotic calcification of the aortic arch. Small bilateral pleural effusions with bibasilar opacities. No pneumothorax. No acute osseous abnormality. IMPRESSION: 1. Small bilateral pleural effusions with bibasilar opacities, favoring atelectasis. Electronically Signed   By: Titus Dubin M.D.   On: 03/03/2018 16:01   Dg Abd 2 Views  Result Date: 03/06/2018 CLINICAL DATA:  Abdominal pain. EXAM: ABDOMEN - 2 VIEW COMPARISON:  03/05/2018 radiographs and prior studies FINDINGS: An NG tube coiled within the stomach again noted. Decreased mild gaseous distension of a few small bowel loops noted. No new findings are identified. IMPRESSION: Decreased mild gaseous distension of a few small bowel loops. NG tube again noted. Electronically Signed   By: Margarette Canada  M.D.   On: 03/06/2018 14:51   Dg Abd Portable 1v  Result Date: 03/04/2018 CLINICAL DATA:  Small bowel obstruction. EXAM: PORTABLE ABDOMEN - 1 VIEW COMPARISON:   Radiograph of March 03, 2018. FINDINGS: Nasogastric tube is seen looped within the stomach with distal tip in proximal stomach. Improved mild small bowel dilatation is noted concerning for distal small bowel obstruction. Vascular calcifications are noted. IMPRESSION: Nasogastric tube tip seen within proximal stomach. Decreased small bowel dilatation is noted suggesting improving distal small bowel obstruction or ileus. Electronically Signed   By: Marijo Conception, M.D.   On: 03/04/2018 20:53   [place] Ekg Site Rite  Result Date: 03/05/2018 If Site Rite image not attached, placement could not be confirmed due to current cardiac rhythm.  Ct Angio Abd/pel W And/or Wo Contrast  Result Date: 03/02/2018 CLINICAL DATA:  Generalized abdominal pain EXAM: CTA ABDOMEN AND PELVIS wITHOUT AND WITH CONTRAST TECHNIQUE: Multidetector CT imaging of the abdomen and pelvis was performed using the standard protocol during bolus administration of intravenous contrast. Multiplanar reconstructed images and MIPs were obtained and reviewed to evaluate the vascular anatomy. CONTRAST:  24mL ISOVUE-370 IOPAMIDOL (ISOVUE-370) INJECTION 76% COMPARISON:  CT 06/26/2017 FINDINGS: VASCULAR Aorta: Moderate severe aortic atherosclerosis. No aneurysm or dissection. Celiac: Heavily calcified at the origin suspected moderate stenosis at the origin. SMA: Moderate calcification at the origin. Suspected moderate stenosis at the origin of the SMA. Distal vascular patency. Renals: Single right and single left renal arteries with dense calcification at the origins. Mild to moderate stenosis suspected at the origin of right renal artery. Suspected moderate stenosis at the origin of the left renal artery. IMA: Calcified at the origin.  Distal patency. Inflow: Moderate severe aortic and common iliac disease without occlusion or high-grade focal stenosis. Moderate disease of the internal iliac arteries. No significant stenosis of the external iliac vessels.  Proximal Outflow: No significant stenosis at the common femoral or imaged portions of the superficial femoral and profunda arteries. Moderate aortic atherosclerosis. Review of the MIP images confirms the above findings. NON-VASCULAR Lower chest: Lung bases demonstrate no acute consolidation or effusion. There is cardiomegaly. Hepatobiliary: Subcentimeter hypodense liver lesions too small to characterize. Stones in the gallbladder. No biliary enlargement Pancreas: Unremarkable. No pancreatic ductal dilatation or surrounding inflammatory changes. Spleen: Normal in size without focal abnormality. Adrenals/Urinary Tract: Adrenal glands are within normal limits. No hydronephrosis. Subcentimeter cortical hypodense renal lesions too small to further characterize. Bladder unremarkable Stomach/Bowel: Stomach is nonenlarged. Fluid-filled borderline enlarged loops of small bowel, mostly within the pelvis. No significant bowel wall thickening. Poorly identified appendix. Possible fluid-filled bowel in the right groin, no evidence for incarceration. Lymphatic: No significantly enlarged lymph nodes. Reproductive: No adnexal mass. Other: No free air.  Small amount of free fluid in the pelvis. Musculoskeletal: Scoliosis and degenerative changes of the spine. No acute or suspicious abnormality. Diffuse anasarca. IMPRESSION: VASCULAR Negative for aortic dissection or acute aortic occlusive disease. Moderate severe aortic atherosclerosis with stenosis of branch vessels in the abdomen and pelvis as detailed above. NON-VASCULAR 1. Borderline fluid-filled enlarged loops distal small bowel primarily within the lower abdomen and pelvis without well-defined transition point. Findings could be secondary to ileus or low-grade obstruction. No free air. 2. Gallstones 3. Small amount of free fluid in the pelvis 4. Cardiomegaly Electronically Signed   By: Donavan Foil M.D.   On: 03/02/2018 00:59   US Abdomen Limited Ruq  Result Date:  03/02/2018 CLINICAL DATA:  Generalized abdominal pain with nausea EXAM:  ULTRASOUND ABDOMEN LIMITED RIGHT UPPER QUADRANT COMPARISON:  CT 03/01/2018 FINDINGS: Gallbladder: Small amount of echogenic sludge. Negative sonographic Murphy. Small stones in the gallbladder. Normal wall thickness. Common bile duct: Diameter: 2.9 mm Liver: Hepatic echogenicity within normal limits. Small cysts within the liver measuring up to 1.1 cm. Portal vein is patent on color Doppler imaging with normal direction of blood flow towards the liver. Tiny amount of fluid adjacent to the liver. Tiny amount of right perinephric fluid. IMPRESSION: 1. Small stones and sludge in the gallbladder. No evidence for acute cholecystitis or biliary dilatation. 2. Trace amount of fluid adjacent to the liver and right kidney 3. Small cysts within the liver Electronically Signed   By: Donavan Foil M.D.   On: 03/02/2018 03:32    Assessment/Plan  Essential hypertension Patient on Clonidine patch, Norvasc, hydralazine and Torsemide, Metoprolol Will Increase  the dose of Hydralazine to 75 mg TID Follow BP Closely Repeat BMP for Renal Function and Potassium  Diabetes mellitus with peripheral vascular disease  BS in Facility mostly less then 200 Not on Any Hypoglycemic agent A1c was 6.4 in 03/19 Follow up with her PCP on discharge   Hypertensive kidney disease with CKD  Stage 3 Creat near baseline Repeat Labs  Protein-calorie malnutrition, severe Albumin of 1.8 in hospital On Prostat  Anemia, Most likely Post op Will repeat CBC Hypothyroidism,  Continue Same dose of Synthroid S/P Femoral hernia Repair Patient doing well. Eating with no Vomiting or Pain Bowels moving well.    Family/ staff Communication:   Labs/tests ordered:  Bmp and CBC

## 2018-04-02 ENCOUNTER — Ambulatory Visit
Admission: RE | Admit: 2018-04-02 | Discharge: 2018-04-02 | Disposition: A | Discharge status: Home / Self Care | Payer: Medicare Other | Source: Ambulatory Visit | Attending: Surgery | Admitting: Surgery

## 2018-04-02 ENCOUNTER — Other Ambulatory Visit
Admission: RE | Admit: 2018-04-02 | Discharge: 2018-04-02 | Disposition: A | Discharge status: Home / Self Care | Payer: Medicare Other | Source: Ambulatory Visit | Attending: *Deleted | Admitting: *Deleted

## 2018-04-02 DIAGNOSIS — K219 Gastro-esophageal reflux disease without esophagitis: Secondary | ICD-10-CM | POA: Insufficient documentation

## 2018-04-02 DIAGNOSIS — K4190 Unilateral femoral hernia, without obstruction or gangrene, not specified as recurrent: Secondary | ICD-10-CM | POA: Diagnosis not present

## 2018-04-02 DIAGNOSIS — L7634 Postprocedural seroma of skin and subcutaneous tissue following other procedure: Secondary | ICD-10-CM | POA: Insufficient documentation

## 2018-04-02 DIAGNOSIS — K5909 Other constipation: Secondary | ICD-10-CM | POA: Insufficient documentation

## 2018-04-02 LAB — CBC WITH DIFFERENTIAL/PLATELET
BASOS PCT: 1 %
Basophils Absolute: 0 10*3/uL (ref 0–0.1)
Eosinophils Absolute: 0.3 10*3/uL (ref 0–0.7)
Eosinophils Relative: 5 %
HEMATOCRIT: 27.1 % — AB (ref 35.0–47.0)
Hemoglobin: 9.2 g/dL — ABNORMAL LOW (ref 12.0–16.0)
Lymphocytes Relative: 30 %
Lymphs Abs: 1.6 10*3/uL (ref 1.0–3.6)
MCH: 31.8 pg (ref 26.0–34.0)
MCHC: 34 g/dL (ref 32.0–36.0)
MCV: 93.5 fL (ref 80.0–100.0)
MONO ABS: 0.6 10*3/uL (ref 0.2–0.9)
MONOS PCT: 12 %
NEUTROS ABS: 2.8 10*3/uL (ref 1.4–6.5)
Neutrophils Relative %: 52 %
Platelets: 321 10*3/uL (ref 150–440)
RBC: 2.89 MIL/uL — ABNORMAL LOW (ref 3.80–5.20)
RDW: 15.4 % — ABNORMAL HIGH (ref 11.5–14.5)
WBC: 5.3 10*3/uL (ref 3.6–11.0)

## 2018-04-02 LAB — BASIC METABOLIC PANEL
Anion gap: 10 (ref 5–15)
BUN: 47 mg/dL — AB (ref 8–23)
CALCIUM: 7.8 mg/dL — AB (ref 8.9–10.3)
CO2: 28 mmol/L (ref 22–32)
CREATININE: 1.06 mg/dL — AB (ref 0.44–1.00)
Chloride: 102 mmol/L (ref 98–111)
GFR calc non Af Amer: 42 mL/min — ABNORMAL LOW (ref >60)
GFR, EST AFRICAN AMERICAN: 49 mL/min — AB (ref >60)
Glucose, Bld: 153 mg/dL — ABNORMAL HIGH (ref 70–99)
Potassium: 4.4 mmol/L (ref 3.5–5.1)
Sodium: 140 mmol/L (ref 135–145)

## 2018-04-02 MED ORDER — IOHEXOL 300 MG/ML  SOLN
60.0000 mL | Freq: Once | INTRAMUSCULAR | Status: AC | PRN
  Administered 2018-04-02: 60 mL via INTRAVENOUS

## 2018-04-05 ENCOUNTER — Encounter: Payer: Self-pay | Admitting: Adult Health

## 2018-04-05 ENCOUNTER — Non-Acute Institutional Stay (SKILLED_NURSING_FACILITY): Payer: Medicare Other | Admitting: Adult Health

## 2018-04-05 DIAGNOSIS — I739 Peripheral vascular disease, unspecified: Secondary | ICD-10-CM | POA: Diagnosis not present

## 2018-04-05 DIAGNOSIS — I120 Hypertensive chronic kidney disease with stage 5 chronic kidney disease or end stage renal disease: Secondary | ICD-10-CM

## 2018-04-05 DIAGNOSIS — N185 Chronic kidney disease, stage 5: Secondary | ICD-10-CM

## 2018-04-05 DIAGNOSIS — E1151 Type 2 diabetes mellitus with diabetic peripheral angiopathy without gangrene: Secondary | ICD-10-CM | POA: Diagnosis not present

## 2018-04-05 NOTE — Progress Notes (Signed)
Location:    [facility] Room Number: 205A Place of Service:  SNF (31)    CODE STATUS: DNR  Allergies  Allergen Reactions  . Ace Inhibitors Other (See Comments)    Angioedema  . Hydrochlorothiazide W-Triamterene Other (See Comments)    Gout flare  . Lisinopril Swelling and Other (See Comments)  . Penicillins Swelling  . Trichlormethiazide Other (See Comments)    Gout flare    Chief Complaint  Patient presents with  . Discharge Note    Discharging from SNF on 04/07/2018    HPI:  She is being discharged home with home health for pt/ot/rn. She will not need dme; she will need to follow up with her medical provider.  She had been hospitalized for a incarcerated femoral hemia. She was admitted to this facility for short term rehab and is now ready for discharge to home.   Past Medical History:  Diagnosis Date  . Asthma   . Diabetes mellitus ([place])   . Environmental allergies   . Hypertension     Past Surgical History:  Procedure Laterality Date  . APPENDECTOMY    . INGUINAL HERNIA REPAIR  03/09/2018   Procedure: HERNIA REPAIR INGUINAL INCARCERATED;  Surgeon: Benjamine Sprague, DO;  Location: [facility] ORS;  Service: General;;  . INSERTION OF MESH  03/09/2018   Procedure: INSERTION OF MESH;  Surgeon: Benjamine Sprague, DO;  Location: [facility] ORS;  Service: General;;  . LAPAROSCOPY N/A 03/09/2018   Procedure: LAPAROSCOPY DIAGNOSTIC;  Surgeon: Benjamine Sprague, DO;  Location: [facility] ORS;  Service: General;  Laterality: N/A;  . TUBAL LIGATION      Social History   Socioeconomic History  . Marital status: Widowed    Spouse name: Not on file  . Number of children: Not on file  . Years of education: Not on file  . Highest education level: Not on file  Occupational History  . Not on file  Social Needs  . Financial resource strain: Not on file  . Food insecurity:    Worry: Not on file    Inability: Not on file  . Transportation needs:    Medical: Not on file   Non-medical: Not on file  Tobacco Use  . Smoking status: Never Smoker  . Smokeless tobacco: Never Used  Substance and Sexual Activity  . Alcohol use: No    Alcohol/week: 0.0 standard drinks  . Drug use: No  . Sexual activity: Never  Lifestyle  . Physical activity:    Days per week: Not on file    Minutes per session: Not on file  . Stress: Not on file  Relationships  . Social connections:    Talks on phone: Not on file    Gets together: Not on file    Attends religious service: Not on file    Active member of club or organization: Not on file    Attends meetings of clubs or organizations: Not on file    Relationship status: Not on file  . Intimate partner violence:    Fear of current or ex partner: Not on file    Emotionally abused: Not on file    Physically abused: Not on file    Forced sexual activity: Not on file  Other Topics Concern  . Not on file  Social History Narrative  . Not on file   Family History  Problem Relation Age of Onset  . Hypertension Mother   . Cancer Brother  lung (question)  . Cancer Sister        questionable type    VITAL SIGNS BP (!) 179/63   Pulse 70   Temp 98.3 F (36.8 C) (Oral)   Resp 18   Ht 4\' 11"  (1.499 m)   Wt 83 lb 1.6 oz (37.7 kg)   SpO2 97%   BMI 16.78 kg/m   Patient's Medications  New Prescriptions   No medications on file  Previous Medications   ACETAMINOPHEN (TYLENOL) 325 MG TABLET    Take 650 mg by mouth every 6 (six) hours as needed for mild pain.    AMINO ACIDS-PROTEIN HYDROLYS (FEEDING SUPPLEMENT, PRO-STAT SUGAR FREE 64,) LIQD    Take 30 mLs by mouth 2 (two) times daily between meals.   AMLODIPINE (NORVASC) 10 MG TABLET    Take 10 mg by mouth daily.   ASPIRIN (ASPIRIN 81) 81 MG EC TABLET    Take 81 mg by mouth daily. Swallow whole.   CALCIUM CARBONATE-VITAMIN D (CALTRATE 600+D PO)    Take 1 tablet by mouth daily.    CLONIDINE (CATAPRES - DOSED IN MG/24 HR) 0.2 MG/24HR PATCH    Place 1 patch (0.2 mg total)  onto the skin once a week.   DICLOFENAC SODIUM (VOLTAREN) 1 % GEL    Apply 4 g topically 3 (three) times daily as needed. Apply to the Right Knee for effusion   FAMOTIDINE (PEPCID) 10 MG TABLET    Take 10 mg by mouth 2 (two) times daily.   FEEDING SUPPLEMENT, ENSURE ENLIVE, (ENSURE ENLIVE) LIQD    Take 237 mLs by mouth 2 (two) times daily between meals.   FEXOFENADINE (ALLEGRA) 180 MG TABLET    Take 1 tablet (180 mg total) by mouth daily.   FLUTICASONE (FLONASE) 50 MCG/ACT NASAL SPRAY    Place 1 spray into both nostrils daily.    GLUCOSE BLOOD (ACCU-CHEK AVIVA PLUS) TEST STRIP    CHECK SUGAR ONCE DAILY   HYDRALAZINE (APRESOLINE) 50 MG TABLET    Take 75 mg by mouth 3 (three) times daily. 1.5 tabs   METOPROLOL TARTRATE (LOPRESSOR) 25 MG TABLET    Take 1 tablet (25 mg total) by mouth 2 (two) times daily.   POLYETHYLENE GLYCOL (MIRALAX / GLYCOLAX) PACKET    Take 17 g by mouth daily.   POTASSIUM CHLORIDE (K-DUR) 10 MEQ TABLET    Take 10 mEq by mouth daily.   SENNA-DOCUSATE (SENOKOT-S) 8.6-50 MG TABLET    Take 2 tablets by mouth 2 (two) times daily.   SYNTHROID 50 MCG TABLET    TAKE ONE (1) TABLET EACH DAY   TORSEMIDE (DEMADEX) 10 MG TABLET    Take 10 mg by mouth daily.   UNABLE TO FIND    Diet order: Downgrade patient to dysphagia 2 diet, add gravy to all trays; continue thin liquids  Modified Medications   No medications on file  Discontinued Medications   AMLODIPINE (NORVASC) 5 MG TABLET    Take 5 mg by mouth daily.    HYDRALAZINE (APRESOLINE) 50 MG TABLET    TAKE 1 TABLET BY MOUTH 3 TIMES A DAY     SIGNIFICANT DIAGNOSTIC EXAMS  PREVIOUS:   03-01-18: ct angio of abdomen and pelvis:  VASCULAR Negative for aortic dissection or acute aortic occlusive disease. Moderate severe aortic atherosclerosis with stenosis of branch vessels in the abdomen and pelvis as detailed above. NON-VASCULAR 1. Borderline fluid-filled enlarged loops distal small bowelprimarily within the lower abdomen and pelvis  without  well-defined transition point. Findings could be secondary to ileus or low-grade obstruction. No free air. 2. Gallstones 3. Small amount of free fluid in the pelvis 4. Cardiomegaly  03-02-18: right upper quad ultrasound:  1. Small stones and sludge in the gallbladder. No evidence for acute cholecystitis or biliary dilatation. 2. Trace amount of fluid adjacent to the liver and right kidney 3. Small cysts within the liver  03-15-18: right lower extremity venous doppler:  1.  No evidence of right lower extremity deep vein thrombosis. 2. 7.8 cm process overlying the right common femoral vessels possibly hematoma less likely residual/recurrent hernia, causing mild mass effect upon the right common femoral vein.  NO NEW EXAMS.   LABS REVIEWED PREVIOUS:   12-02-17: vit B 12: 306; iron 23; transferrin 188.0 03-01-18: wbc 7.0; hgb 12.1; hct 36.2; mcv 91.7; plt 257 glucose 234; bun 31; creat 0.96; k+ 4.4; na++ 139; ca 8.9 liver normal albumin 3.4 03-03-18: pre-albumin  Glucose 160; bun 30; creat 1.04 ;k+ 4.3; na++ 142; ca 8.5  03-05-18: wbc 9.9; hgb 10.1; hct 29.6; mcv 92.8; plt 232 glucose 114; bun 34; creat 1.14; k+ 3.6; na++ 143; ca 7.9 mag 2.3 phos 4.2  liver normal albumin 2.0 03-06-18: pre-albumin 11.2  03-11-18: wbc 12.6; hgb 8.6; hct 25.2; mcv 92.1; plt 296 glucose 202; bun 71; creat 1.42; k+ 4.4; na++ 140; ca 7.2 phos 4.0 mag 2.5  03-16-18: wbc 7.7; hgb 8.2; hct 24.2; mcv 91.3; plt 400 glucose 143; bun 40; creat 0.98; k+ 3.5; na++ 143; ca 7.4;  Liver normal albumin 1.8  NO NEW LABS.    Review of Systems  Constitutional: Negative for malaise/fatigue.  Respiratory: Negative for cough and shortness of breath.   Cardiovascular: Negative for chest pain, palpitations and leg swelling.  Gastrointestinal: Negative for abdominal pain, constipation and heartburn.  Musculoskeletal: Negative for back pain, joint pain and myalgias.  Skin: Negative.   Neurological: Negative for dizziness.    Psychiatric/Behavioral: The patient is not nervous/anxious.    Physical Exam  Constitutional: She is oriented to person, place, and time. No distress.  Frail   Neck: No thyromegaly present.  Cardiovascular: Normal rate, regular rhythm, normal heart sounds and intact distal pulses.  Pulmonary/Chest: Effort normal and breath sounds normal. No respiratory distress.  Abdominal: Soft. Bowel sounds are normal. She exhibits no distension. There is no tenderness.  Musculoskeletal: Normal range of motion. She exhibits edema.  Trace bilateral lower extremity edema    Lymphadenopathy:    She has no cervical adenopathy.  Neurological: She is alert and oriented to person, place, and time.  Skin: Skin is warm and dry. She is not diaphoretic.  Psychiatric: She has a normal mood and affect.     ASSESSMENT/ PLAN:  Patient is being discharged with the following home health services: pt/ot/rn: to evaluate and treat as indicated for gait balance strength adl training and medication management    Patient is being discharged with the following durable medical equipment:  None needed  Patient has been advised to f/u with their PCP in 1-2 weeks to bring them up to date on their rehab stay.  Social services at facility was responsible for arranging this appointment.  Pt was provided with a 30 day supply of prescriptions for medications and refills must be obtained from their PCP.  For controlled substances, a more limited supply may be provided adequate until PCP appointment only.  A 30 day supply of her prescription medications have been written as above  Time  spent with patient: 30 minutes: discussed medications; home health needs and expectations: verbalized understanding.    Ok Edwards NP [facility]  Contact [phone] Monday through Friday 8am- 5pm  After hours call [phone]

## 2018-04-08 ENCOUNTER — Ambulatory Visit (INDEPENDENT_AMBULATORY_CARE_PROVIDER_SITE_OTHER): Payer: Medicare Other

## 2018-04-08 ENCOUNTER — Other Ambulatory Visit: Payer: Self-pay | Admitting: Internal Medicine

## 2018-04-08 DIAGNOSIS — E538 Deficiency of other specified B group vitamins: Secondary | ICD-10-CM

## 2018-04-08 MED ORDER — CYANOCOBALAMIN 1000 MCG/ML IJ SOLN
1000.0000 ug | Freq: Once | INTRAMUSCULAR | Status: AC
  Administered 2018-04-08: 1000 ug via INTRAMUSCULAR

## 2018-04-08 NOTE — Progress Notes (Addendum)
Patient comes in for B 12 injection.  Injected left deltoid.  Patient tolerated injection well.   Reviewed above information.  Dr Nicki Reaper

## 2018-04-12 DIAGNOSIS — Z48815 Encounter for surgical aftercare following surgery on the digestive system: Secondary | ICD-10-CM | POA: Diagnosis not present

## 2018-04-12 DIAGNOSIS — E46 Unspecified protein-calorie malnutrition: Secondary | ICD-10-CM | POA: Diagnosis not present

## 2018-04-12 DIAGNOSIS — I120 Hypertensive chronic kidney disease with stage 5 chronic kidney disease or end stage renal disease: Secondary | ICD-10-CM | POA: Diagnosis not present

## 2018-04-12 DIAGNOSIS — E1151 Type 2 diabetes mellitus with diabetic peripheral angiopathy without gangrene: Secondary | ICD-10-CM | POA: Diagnosis not present

## 2018-04-12 DIAGNOSIS — E1122 Type 2 diabetes mellitus with diabetic chronic kidney disease: Secondary | ICD-10-CM | POA: Diagnosis not present

## 2018-04-12 DIAGNOSIS — R1312 Dysphagia, oropharyngeal phase: Secondary | ICD-10-CM | POA: Diagnosis not present

## 2018-04-12 DIAGNOSIS — Z9181 History of falling: Secondary | ICD-10-CM | POA: Diagnosis not present

## 2018-04-12 DIAGNOSIS — N185 Chronic kidney disease, stage 5: Secondary | ICD-10-CM | POA: Diagnosis not present

## 2018-04-12 DIAGNOSIS — M810 Age-related osteoporosis without current pathological fracture: Secondary | ICD-10-CM | POA: Diagnosis not present

## 2018-04-12 DIAGNOSIS — Z7982 Long term (current) use of aspirin: Secondary | ICD-10-CM | POA: Diagnosis not present

## 2018-04-12 DIAGNOSIS — J45909 Unspecified asthma, uncomplicated: Secondary | ICD-10-CM | POA: Diagnosis not present

## 2018-04-13 ENCOUNTER — Telehealth: Payer: Self-pay | Admitting: Internal Medicine

## 2018-04-13 DIAGNOSIS — R1312 Dysphagia, oropharyngeal phase: Secondary | ICD-10-CM | POA: Diagnosis not present

## 2018-04-13 DIAGNOSIS — Z7982 Long term (current) use of aspirin: Secondary | ICD-10-CM | POA: Diagnosis not present

## 2018-04-13 DIAGNOSIS — E1122 Type 2 diabetes mellitus with diabetic chronic kidney disease: Secondary | ICD-10-CM | POA: Diagnosis not present

## 2018-04-13 DIAGNOSIS — N185 Chronic kidney disease, stage 5: Secondary | ICD-10-CM | POA: Diagnosis not present

## 2018-04-13 DIAGNOSIS — Z9181 History of falling: Secondary | ICD-10-CM | POA: Diagnosis not present

## 2018-04-13 DIAGNOSIS — E1151 Type 2 diabetes mellitus with diabetic peripheral angiopathy without gangrene: Secondary | ICD-10-CM | POA: Diagnosis not present

## 2018-04-13 DIAGNOSIS — E46 Unspecified protein-calorie malnutrition: Secondary | ICD-10-CM | POA: Diagnosis not present

## 2018-04-13 DIAGNOSIS — J45909 Unspecified asthma, uncomplicated: Secondary | ICD-10-CM | POA: Diagnosis not present

## 2018-04-13 DIAGNOSIS — I120 Hypertensive chronic kidney disease with stage 5 chronic kidney disease or end stage renal disease: Secondary | ICD-10-CM | POA: Diagnosis not present

## 2018-04-13 DIAGNOSIS — Z48815 Encounter for surgical aftercare following surgery on the digestive system: Secondary | ICD-10-CM | POA: Diagnosis not present

## 2018-04-13 DIAGNOSIS — M810 Age-related osteoporosis without current pathological fracture: Secondary | ICD-10-CM | POA: Diagnosis not present

## 2018-04-13 NOTE — Telephone Encounter (Signed)
Left voicemail for Tara [name] at [facility] to call the office back for verbal orders.

## 2018-04-13 NOTE — Telephone Encounter (Signed)
Copied from [place] [phone]. Topic: Quick Communication - See Telephone Encounter >> Apr 13, 2018 10:30 AM Percell Belt A wrote: CRM for notification. See Telephone encounter for: 04/13/18.  Caryl Pina with [facility] -[phone] Need Verbals for nursing  1 week 4 1 every other week for 5  And 2 Prn

## 2018-04-15 ENCOUNTER — Telehealth: Payer: Self-pay

## 2018-04-15 ENCOUNTER — Telehealth: Payer: Self-pay | Admitting: Internal Medicine

## 2018-04-15 NOTE — Telephone Encounter (Signed)
Copied from [place] [phone]. Topic: Quick Communication - See Telephone Encounter >> Apr 15, 2018 11:28 AM Hewitt Shorts wrote: Caryl Pina from [facility] is calling to get verbal orders for nursing visits 1 time a week for 4 weeks and 1 time every other week for 5 weeks   Best number [phone] ashley  Left voicemail for Caryl Pina at [facility] for verbal orders for patient and to call the office if she has any questions or concerns then to give the office a call.

## 2018-04-15 NOTE — Telephone Encounter (Signed)
   Tara [name]  ( PT ) is calling again about orders. She needs verbals orders for 2x a week for 2 weeks. 1 x a week for 1 week.   [phone] or [phone]

## 2018-04-15 NOTE — Telephone Encounter (Signed)
Called and left message for [place] at both numbers listed. Will give verbal orders when she calls back.

## 2018-04-15 NOTE — Telephone Encounter (Signed)
Copied from [place] [phone]. Topic: Inquiry >> Apr 13, 2018 11:49 AM Oliver Pila B wrote: Reason for CRM: [facility] called for PT orders for pt, contact [phone]

## 2018-04-16 ENCOUNTER — Telehealth: Payer: Self-pay | Admitting: Internal Medicine

## 2018-04-16 DIAGNOSIS — J45909 Unspecified asthma, uncomplicated: Secondary | ICD-10-CM | POA: Diagnosis not present

## 2018-04-16 DIAGNOSIS — E1122 Type 2 diabetes mellitus with diabetic chronic kidney disease: Secondary | ICD-10-CM | POA: Diagnosis not present

## 2018-04-16 DIAGNOSIS — R1312 Dysphagia, oropharyngeal phase: Secondary | ICD-10-CM | POA: Diagnosis not present

## 2018-04-16 DIAGNOSIS — I120 Hypertensive chronic kidney disease with stage 5 chronic kidney disease or end stage renal disease: Secondary | ICD-10-CM | POA: Diagnosis not present

## 2018-04-16 DIAGNOSIS — Z9181 History of falling: Secondary | ICD-10-CM | POA: Diagnosis not present

## 2018-04-16 DIAGNOSIS — Z48815 Encounter for surgical aftercare following surgery on the digestive system: Secondary | ICD-10-CM | POA: Diagnosis not present

## 2018-04-16 DIAGNOSIS — N185 Chronic kidney disease, stage 5: Secondary | ICD-10-CM | POA: Diagnosis not present

## 2018-04-16 DIAGNOSIS — E1151 Type 2 diabetes mellitus with diabetic peripheral angiopathy without gangrene: Secondary | ICD-10-CM | POA: Diagnosis not present

## 2018-04-16 DIAGNOSIS — Z7982 Long term (current) use of aspirin: Secondary | ICD-10-CM | POA: Diagnosis not present

## 2018-04-16 DIAGNOSIS — M810 Age-related osteoporosis without current pathological fracture: Secondary | ICD-10-CM | POA: Diagnosis not present

## 2018-04-16 DIAGNOSIS — E46 Unspecified protein-calorie malnutrition: Secondary | ICD-10-CM | POA: Diagnosis not present

## 2018-04-16 NOTE — Telephone Encounter (Signed)
Agree.  Confirm with pt no problems.  Agree with need for evaluation if problems.

## 2018-04-16 NOTE — Telephone Encounter (Signed)
FYI

## 2018-04-16 NOTE — Telephone Encounter (Signed)
Copied from [place] [phone]. Topic: Quick Communication - See Telephone Encounter >> Apr 16, 2018 11:26 AM Bea Graff, NT wrote: CRM for notification. See Telephone encounter for: 04/16/18. Janelle with [facility] calling to report pt fell last night. Suffered no injuries. Cb#: [phone]

## 2018-04-16 NOTE — Telephone Encounter (Signed)
FYI. Please contact [facility] for more information

## 2018-04-16 NOTE — Telephone Encounter (Signed)
Copied from [place] [phone]. Topic: General - Other >> Apr 16, 2018 11:11 AM Lennox Solders wrote: Reason for CRM: esther OT [facility] is calling the pt daughter decline OT evaluation for her mother

## 2018-04-16 NOTE — Telephone Encounter (Signed)
Called pt. Patient fell coming out of the bathroom and one of her legs gave out. No injuries noted. Advised patient to let us know if she needed anything or if she develops symptoms she should be evaluated over the weekend.

## 2018-04-19 ENCOUNTER — Other Ambulatory Visit: Payer: Self-pay | Admitting: Internal Medicine

## 2018-04-19 DIAGNOSIS — J45909 Unspecified asthma, uncomplicated: Secondary | ICD-10-CM | POA: Diagnosis not present

## 2018-04-19 DIAGNOSIS — Z7982 Long term (current) use of aspirin: Secondary | ICD-10-CM | POA: Diagnosis not present

## 2018-04-19 DIAGNOSIS — R1312 Dysphagia, oropharyngeal phase: Secondary | ICD-10-CM | POA: Diagnosis not present

## 2018-04-19 DIAGNOSIS — E1122 Type 2 diabetes mellitus with diabetic chronic kidney disease: Secondary | ICD-10-CM | POA: Diagnosis not present

## 2018-04-19 DIAGNOSIS — N185 Chronic kidney disease, stage 5: Secondary | ICD-10-CM | POA: Diagnosis not present

## 2018-04-19 DIAGNOSIS — M810 Age-related osteoporosis without current pathological fracture: Secondary | ICD-10-CM | POA: Diagnosis not present

## 2018-04-19 DIAGNOSIS — E46 Unspecified protein-calorie malnutrition: Secondary | ICD-10-CM | POA: Diagnosis not present

## 2018-04-19 DIAGNOSIS — I120 Hypertensive chronic kidney disease with stage 5 chronic kidney disease or end stage renal disease: Secondary | ICD-10-CM | POA: Diagnosis not present

## 2018-04-19 DIAGNOSIS — Z48815 Encounter for surgical aftercare following surgery on the digestive system: Secondary | ICD-10-CM | POA: Diagnosis not present

## 2018-04-19 DIAGNOSIS — Z9181 History of falling: Secondary | ICD-10-CM | POA: Diagnosis not present

## 2018-04-19 DIAGNOSIS — E1151 Type 2 diabetes mellitus with diabetic peripheral angiopathy without gangrene: Secondary | ICD-10-CM | POA: Diagnosis not present

## 2018-04-20 ENCOUNTER — Other Ambulatory Visit: Payer: Self-pay | Admitting: Internal Medicine

## 2018-04-20 DIAGNOSIS — E46 Unspecified protein-calorie malnutrition: Secondary | ICD-10-CM | POA: Diagnosis not present

## 2018-04-20 DIAGNOSIS — N185 Chronic kidney disease, stage 5: Secondary | ICD-10-CM | POA: Diagnosis not present

## 2018-04-20 DIAGNOSIS — E1122 Type 2 diabetes mellitus with diabetic chronic kidney disease: Secondary | ICD-10-CM | POA: Diagnosis not present

## 2018-04-20 DIAGNOSIS — Z7982 Long term (current) use of aspirin: Secondary | ICD-10-CM | POA: Diagnosis not present

## 2018-04-20 DIAGNOSIS — J45909 Unspecified asthma, uncomplicated: Secondary | ICD-10-CM | POA: Diagnosis not present

## 2018-04-20 DIAGNOSIS — M810 Age-related osteoporosis without current pathological fracture: Secondary | ICD-10-CM | POA: Diagnosis not present

## 2018-04-20 DIAGNOSIS — Z9181 History of falling: Secondary | ICD-10-CM | POA: Diagnosis not present

## 2018-04-20 DIAGNOSIS — I120 Hypertensive chronic kidney disease with stage 5 chronic kidney disease or end stage renal disease: Secondary | ICD-10-CM | POA: Diagnosis not present

## 2018-04-20 DIAGNOSIS — Z48815 Encounter for surgical aftercare following surgery on the digestive system: Secondary | ICD-10-CM | POA: Diagnosis not present

## 2018-04-20 DIAGNOSIS — R1312 Dysphagia, oropharyngeal phase: Secondary | ICD-10-CM | POA: Diagnosis not present

## 2018-04-20 DIAGNOSIS — E1151 Type 2 diabetes mellitus with diabetic peripheral angiopathy without gangrene: Secondary | ICD-10-CM | POA: Diagnosis not present

## 2018-04-22 DIAGNOSIS — R1312 Dysphagia, oropharyngeal phase: Secondary | ICD-10-CM | POA: Diagnosis not present

## 2018-04-22 DIAGNOSIS — Z9181 History of falling: Secondary | ICD-10-CM | POA: Diagnosis not present

## 2018-04-22 DIAGNOSIS — E1151 Type 2 diabetes mellitus with diabetic peripheral angiopathy without gangrene: Secondary | ICD-10-CM | POA: Diagnosis not present

## 2018-04-22 DIAGNOSIS — M810 Age-related osteoporosis without current pathological fracture: Secondary | ICD-10-CM | POA: Diagnosis not present

## 2018-04-22 DIAGNOSIS — E1122 Type 2 diabetes mellitus with diabetic chronic kidney disease: Secondary | ICD-10-CM | POA: Diagnosis not present

## 2018-04-22 DIAGNOSIS — I120 Hypertensive chronic kidney disease with stage 5 chronic kidney disease or end stage renal disease: Secondary | ICD-10-CM | POA: Diagnosis not present

## 2018-04-22 DIAGNOSIS — N185 Chronic kidney disease, stage 5: Secondary | ICD-10-CM | POA: Diagnosis not present

## 2018-04-22 DIAGNOSIS — E46 Unspecified protein-calorie malnutrition: Secondary | ICD-10-CM | POA: Diagnosis not present

## 2018-04-22 DIAGNOSIS — Z48815 Encounter for surgical aftercare following surgery on the digestive system: Secondary | ICD-10-CM | POA: Diagnosis not present

## 2018-04-22 DIAGNOSIS — J45909 Unspecified asthma, uncomplicated: Secondary | ICD-10-CM | POA: Diagnosis not present

## 2018-04-22 DIAGNOSIS — Z7982 Long term (current) use of aspirin: Secondary | ICD-10-CM | POA: Diagnosis not present

## 2018-04-23 NOTE — Telephone Encounter (Signed)
Okay to refill? Request was received on 04/20/18

## 2018-04-23 NOTE — Telephone Encounter (Signed)
Confirmed with daughter that she is taking 50 mg TID

## 2018-04-23 NOTE — Telephone Encounter (Signed)
Need to confirm with pt or family - if Ms Lartigue is taking hydralazine and dose taking.

## 2018-04-26 ENCOUNTER — Encounter (INDEPENDENT_AMBULATORY_CARE_PROVIDER_SITE_OTHER): Payer: Self-pay

## 2018-04-26 ENCOUNTER — Ambulatory Visit (INDEPENDENT_AMBULATORY_CARE_PROVIDER_SITE_OTHER): Payer: Self-pay | Admitting: Vascular Surgery

## 2018-04-27 DIAGNOSIS — M810 Age-related osteoporosis without current pathological fracture: Secondary | ICD-10-CM | POA: Diagnosis not present

## 2018-04-27 DIAGNOSIS — Z7982 Long term (current) use of aspirin: Secondary | ICD-10-CM | POA: Diagnosis not present

## 2018-04-27 DIAGNOSIS — J45909 Unspecified asthma, uncomplicated: Secondary | ICD-10-CM | POA: Diagnosis not present

## 2018-04-27 DIAGNOSIS — E46 Unspecified protein-calorie malnutrition: Secondary | ICD-10-CM | POA: Diagnosis not present

## 2018-04-27 DIAGNOSIS — Z9181 History of falling: Secondary | ICD-10-CM | POA: Diagnosis not present

## 2018-04-27 DIAGNOSIS — E1122 Type 2 diabetes mellitus with diabetic chronic kidney disease: Secondary | ICD-10-CM | POA: Diagnosis not present

## 2018-04-27 DIAGNOSIS — I120 Hypertensive chronic kidney disease with stage 5 chronic kidney disease or end stage renal disease: Secondary | ICD-10-CM | POA: Diagnosis not present

## 2018-04-27 DIAGNOSIS — E1151 Type 2 diabetes mellitus with diabetic peripheral angiopathy without gangrene: Secondary | ICD-10-CM | POA: Diagnosis not present

## 2018-04-27 DIAGNOSIS — Z48815 Encounter for surgical aftercare following surgery on the digestive system: Secondary | ICD-10-CM | POA: Diagnosis not present

## 2018-04-27 DIAGNOSIS — R1312 Dysphagia, oropharyngeal phase: Secondary | ICD-10-CM | POA: Diagnosis not present

## 2018-04-27 DIAGNOSIS — N185 Chronic kidney disease, stage 5: Secondary | ICD-10-CM | POA: Diagnosis not present

## 2018-04-28 ENCOUNTER — Telehealth: Payer: Self-pay

## 2018-04-28 NOTE — Telephone Encounter (Signed)
Copied from [place] [phone]. Topic: General - Other >> Apr 28, 2018  1:20 PM Carolyn Stare wrote:  [facility] looking for plan of care orders to be faxed back to her   Fax [phone]     phone number  [phone]  ext [phone]

## 2018-04-29 ENCOUNTER — Other Ambulatory Visit: Payer: Self-pay

## 2018-04-29 DIAGNOSIS — J45909 Unspecified asthma, uncomplicated: Secondary | ICD-10-CM | POA: Diagnosis not present

## 2018-04-29 DIAGNOSIS — Z9181 History of falling: Secondary | ICD-10-CM | POA: Diagnosis not present

## 2018-04-29 DIAGNOSIS — E1122 Type 2 diabetes mellitus with diabetic chronic kidney disease: Secondary | ICD-10-CM | POA: Diagnosis not present

## 2018-04-29 DIAGNOSIS — E1151 Type 2 diabetes mellitus with diabetic peripheral angiopathy without gangrene: Secondary | ICD-10-CM | POA: Diagnosis not present

## 2018-04-29 DIAGNOSIS — I120 Hypertensive chronic kidney disease with stage 5 chronic kidney disease or end stage renal disease: Secondary | ICD-10-CM | POA: Diagnosis not present

## 2018-04-29 DIAGNOSIS — M810 Age-related osteoporosis without current pathological fracture: Secondary | ICD-10-CM | POA: Diagnosis not present

## 2018-04-29 DIAGNOSIS — Z48815 Encounter for surgical aftercare following surgery on the digestive system: Secondary | ICD-10-CM | POA: Diagnosis not present

## 2018-04-29 DIAGNOSIS — Z7982 Long term (current) use of aspirin: Secondary | ICD-10-CM | POA: Diagnosis not present

## 2018-04-29 DIAGNOSIS — R1312 Dysphagia, oropharyngeal phase: Secondary | ICD-10-CM | POA: Diagnosis not present

## 2018-04-29 DIAGNOSIS — N185 Chronic kidney disease, stage 5: Secondary | ICD-10-CM | POA: Diagnosis not present

## 2018-04-29 DIAGNOSIS — E46 Unspecified protein-calorie malnutrition: Secondary | ICD-10-CM | POA: Diagnosis not present

## 2018-04-29 NOTE — Telephone Encounter (Signed)
Reviewed. Couple of questions about her medication.  In box.

## 2018-04-29 NOTE — Telephone Encounter (Signed)
Plan of care placed in your folder for quick sign. I have verified meds and will check with daughter to see if she is taking Vitamin D

## 2018-05-03 ENCOUNTER — Other Ambulatory Visit: Payer: Self-pay | Admitting: Surgery

## 2018-05-03 ENCOUNTER — Ambulatory Visit
Admission: RE | Admit: 2018-05-03 | Discharge: 2018-05-03 | Disposition: A | Discharge status: Home / Self Care | Payer: Medicare Other | Source: Ambulatory Visit | Attending: Surgery | Admitting: Surgery

## 2018-05-03 DIAGNOSIS — I700 Atherosclerosis of aorta: Secondary | ICD-10-CM | POA: Diagnosis not present

## 2018-05-03 DIAGNOSIS — R102 Pelvic and perineal pain: Secondary | ICD-10-CM

## 2018-05-03 DIAGNOSIS — K4191 Unilateral femoral hernia, without obstruction or gangrene, recurrent: Secondary | ICD-10-CM

## 2018-05-03 DIAGNOSIS — I517 Cardiomegaly: Secondary | ICD-10-CM | POA: Insufficient documentation

## 2018-05-03 DIAGNOSIS — K8020 Calculus of gallbladder without cholecystitis without obstruction: Secondary | ICD-10-CM | POA: Diagnosis not present

## 2018-05-03 MED ORDER — IOHEXOL 300 MG/ML  SOLN
60.0000 mL | Freq: Once | INTRAMUSCULAR | Status: AC | PRN
  Administered 2018-05-03: 60 mL via INTRAVENOUS

## 2018-05-05 DIAGNOSIS — S301XXA Contusion of abdominal wall, initial encounter: Secondary | ICD-10-CM | POA: Diagnosis not present

## 2018-05-05 NOTE — Telephone Encounter (Signed)
Left message for Tara [name] to return call.

## 2018-05-06 ENCOUNTER — Other Ambulatory Visit: Payer: Self-pay | Admitting: Internal Medicine

## 2018-05-07 DIAGNOSIS — E46 Unspecified protein-calorie malnutrition: Secondary | ICD-10-CM | POA: Diagnosis not present

## 2018-05-07 DIAGNOSIS — N185 Chronic kidney disease, stage 5: Secondary | ICD-10-CM | POA: Diagnosis not present

## 2018-05-07 DIAGNOSIS — I120 Hypertensive chronic kidney disease with stage 5 chronic kidney disease or end stage renal disease: Secondary | ICD-10-CM | POA: Diagnosis not present

## 2018-05-07 DIAGNOSIS — Z7982 Long term (current) use of aspirin: Secondary | ICD-10-CM | POA: Diagnosis not present

## 2018-05-07 DIAGNOSIS — M810 Age-related osteoporosis without current pathological fracture: Secondary | ICD-10-CM | POA: Diagnosis not present

## 2018-05-07 DIAGNOSIS — Z9181 History of falling: Secondary | ICD-10-CM | POA: Diagnosis not present

## 2018-05-07 DIAGNOSIS — Z48815 Encounter for surgical aftercare following surgery on the digestive system: Secondary | ICD-10-CM | POA: Diagnosis not present

## 2018-05-07 DIAGNOSIS — E1122 Type 2 diabetes mellitus with diabetic chronic kidney disease: Secondary | ICD-10-CM | POA: Diagnosis not present

## 2018-05-07 DIAGNOSIS — E1151 Type 2 diabetes mellitus with diabetic peripheral angiopathy without gangrene: Secondary | ICD-10-CM | POA: Diagnosis not present

## 2018-05-07 DIAGNOSIS — R1312 Dysphagia, oropharyngeal phase: Secondary | ICD-10-CM | POA: Diagnosis not present

## 2018-05-07 DIAGNOSIS — J45909 Unspecified asthma, uncomplicated: Secondary | ICD-10-CM | POA: Diagnosis not present

## 2018-05-12 ENCOUNTER — Ambulatory Visit (INDEPENDENT_AMBULATORY_CARE_PROVIDER_SITE_OTHER): Payer: Medicare Other | Admitting: *Deleted

## 2018-05-12 ENCOUNTER — Telehealth: Payer: Self-pay

## 2018-05-12 DIAGNOSIS — E538 Deficiency of other specified B group vitamins: Secondary | ICD-10-CM | POA: Diagnosis not present

## 2018-05-12 DIAGNOSIS — L03818 Cellulitis of other sites: Secondary | ICD-10-CM | POA: Diagnosis not present

## 2018-05-12 DIAGNOSIS — Z23 Encounter for immunization: Secondary | ICD-10-CM

## 2018-05-12 MED ORDER — CYANOCOBALAMIN 1000 MCG/ML IJ SOLN
1000.0000 ug | Freq: Once | INTRAMUSCULAR | Status: AC
  Administered 2018-05-12: 1000 ug via INTRAMUSCULAR

## 2018-05-12 NOTE — Progress Notes (Signed)
Patient presented for B 12 injection to right deltoid, patient voiced no concerns nor showed any signs of distress during injection. 

## 2018-05-12 NOTE — Telephone Encounter (Signed)
Copied from [place] [phone]. Topic: General - Other >> May 12, 2018  3:21 PM Yvette Rack wrote: Reason for CRM: [facility] [phone]  calling Wannetta Sender back she states that she had faxed forms back on 03-01-18 04-28-18 and just need a signature

## 2018-05-13 NOTE — Telephone Encounter (Signed)
See other phone note

## 2018-05-14 ENCOUNTER — Other Ambulatory Visit: Payer: Self-pay

## 2018-05-14 ENCOUNTER — Inpatient Hospital Stay: Payer: Medicare Other | Admitting: Anesthesiology

## 2018-05-14 ENCOUNTER — Inpatient Hospital Stay
Admission: AD | Admit: 2018-05-14 | Discharge: 2018-05-19 | DRG: 856 | Disposition: A | Discharge status: Home Health | Payer: Medicare Other | Source: Ambulatory Visit | Attending: Surgery | Admitting: Surgery

## 2018-05-14 ENCOUNTER — Encounter
Admission: AD | Disposition: A | Discharge status: Home Health | Payer: Self-pay | Source: Ambulatory Visit | Attending: Surgery

## 2018-05-14 DIAGNOSIS — Z888 Allergy status to other drugs, medicaments and biological substances status: Secondary | ICD-10-CM | POA: Diagnosis not present

## 2018-05-14 DIAGNOSIS — L7634 Postprocedural seroma of skin and subcutaneous tissue following other procedure: Secondary | ICD-10-CM | POA: Diagnosis present

## 2018-05-14 DIAGNOSIS — Z79890 Hormone replacement therapy (postmenopausal): Secondary | ICD-10-CM

## 2018-05-14 DIAGNOSIS — L02214 Cutaneous abscess of groin: Secondary | ICD-10-CM | POA: Diagnosis present

## 2018-05-14 DIAGNOSIS — J45909 Unspecified asthma, uncomplicated: Secondary | ICD-10-CM | POA: Diagnosis present

## 2018-05-14 DIAGNOSIS — T8149XA Infection following a procedure, other surgical site, initial encounter: Secondary | ICD-10-CM | POA: Diagnosis present

## 2018-05-14 DIAGNOSIS — Z66 Do not resuscitate: Secondary | ICD-10-CM | POA: Diagnosis present

## 2018-05-14 DIAGNOSIS — D649 Anemia, unspecified: Secondary | ICD-10-CM | POA: Diagnosis present

## 2018-05-14 DIAGNOSIS — E039 Hypothyroidism, unspecified: Secondary | ICD-10-CM | POA: Diagnosis not present

## 2018-05-14 DIAGNOSIS — Z794 Long term (current) use of insulin: Secondary | ICD-10-CM | POA: Diagnosis not present

## 2018-05-14 DIAGNOSIS — I96 Gangrene, not elsewhere classified: Secondary | ICD-10-CM | POA: Diagnosis present

## 2018-05-14 DIAGNOSIS — T8142XA Infection following a procedure, deep incisional surgical site, initial encounter: Secondary | ICD-10-CM | POA: Diagnosis not present

## 2018-05-14 DIAGNOSIS — Z79899 Other long term (current) drug therapy: Secondary | ICD-10-CM | POA: Diagnosis not present

## 2018-05-14 DIAGNOSIS — N179 Acute kidney failure, unspecified: Secondary | ICD-10-CM | POA: Diagnosis not present

## 2018-05-14 DIAGNOSIS — I120 Hypertensive chronic kidney disease with stage 5 chronic kidney disease or end stage renal disease: Secondary | ICD-10-CM | POA: Diagnosis not present

## 2018-05-14 DIAGNOSIS — E1152 Type 2 diabetes mellitus with diabetic peripheral angiopathy with gangrene: Secondary | ICD-10-CM | POA: Diagnosis present

## 2018-05-14 DIAGNOSIS — Z8249 Family history of ischemic heart disease and other diseases of the circulatory system: Secondary | ICD-10-CM | POA: Diagnosis not present

## 2018-05-14 DIAGNOSIS — T8579XA Infection and inflammatory reaction due to other internal prosthetic devices, implants and grafts, initial encounter: Secondary | ICD-10-CM | POA: Diagnosis present

## 2018-05-14 DIAGNOSIS — T8141XA Infection following a procedure, superficial incisional surgical site, initial encounter: Secondary | ICD-10-CM | POA: Diagnosis not present

## 2018-05-14 DIAGNOSIS — Z681 Body mass index (BMI) 19 or less, adult: Secondary | ICD-10-CM | POA: Diagnosis not present

## 2018-05-14 DIAGNOSIS — E119 Type 2 diabetes mellitus without complications: Secondary | ICD-10-CM | POA: Diagnosis not present

## 2018-05-14 DIAGNOSIS — Z880 Allergy status to penicillin: Secondary | ICD-10-CM

## 2018-05-14 DIAGNOSIS — E43 Unspecified severe protein-calorie malnutrition: Secondary | ICD-10-CM | POA: Diagnosis present

## 2018-05-14 DIAGNOSIS — R7989 Other specified abnormal findings of blood chemistry: Secondary | ICD-10-CM | POA: Diagnosis not present

## 2018-05-14 DIAGNOSIS — Z7982 Long term (current) use of aspirin: Secondary | ICD-10-CM | POA: Diagnosis not present

## 2018-05-14 DIAGNOSIS — Y838 Other surgical procedures as the cause of abnormal reaction of the patient, or of later complication, without mention of misadventure at the time of the procedure: Secondary | ICD-10-CM | POA: Diagnosis present

## 2018-05-14 DIAGNOSIS — T8189XA Other complications of procedures, not elsewhere classified, initial encounter: Secondary | ICD-10-CM | POA: Diagnosis not present

## 2018-05-14 DIAGNOSIS — I10 Essential (primary) hypertension: Secondary | ICD-10-CM | POA: Diagnosis present

## 2018-05-14 DIAGNOSIS — E1122 Type 2 diabetes mellitus with diabetic chronic kidney disease: Secondary | ICD-10-CM | POA: Diagnosis not present

## 2018-05-14 DIAGNOSIS — E7800 Pure hypercholesterolemia, unspecified: Secondary | ICD-10-CM | POA: Diagnosis not present

## 2018-05-14 DIAGNOSIS — N185 Chronic kidney disease, stage 5: Secondary | ICD-10-CM | POA: Diagnosis not present

## 2018-05-14 HISTORY — PX: INCISION AND DRAINAGE ABSCESS: SHX5864

## 2018-05-14 HISTORY — PX: APPLICATION OF WOUND VAC: SHX5189

## 2018-05-14 LAB — BASIC METABOLIC PANEL
Anion gap: 12 (ref 5–15)
BUN: 42 mg/dL — AB (ref 8–23)
CO2: 22 mmol/L (ref 22–32)
Calcium: 8.3 mg/dL — ABNORMAL LOW (ref 8.9–10.3)
Chloride: 102 mmol/L (ref 98–111)
Creatinine, Ser: 1.49 mg/dL — ABNORMAL HIGH (ref 0.44–1.00)
GFR, EST AFRICAN AMERICAN: 32 mL/min — AB (ref >60)
GFR, EST NON AFRICAN AMERICAN: 28 mL/min — AB (ref >60)
Glucose, Bld: 181 mg/dL — ABNORMAL HIGH (ref 70–99)
POTASSIUM: 4.7 mmol/L (ref 3.5–5.1)
SODIUM: 136 mmol/L (ref 135–145)

## 2018-05-14 LAB — CBC
HCT: 32.5 % — ABNORMAL LOW (ref 35.0–47.0)
Hemoglobin: 10.4 g/dL — ABNORMAL LOW (ref 12.0–16.0)
MCH: 30.6 pg (ref 26.0–34.0)
MCHC: 32.1 g/dL (ref 32.0–36.0)
MCV: 95.5 fL (ref 80.0–100.0)
PLATELETS: 307 10*3/uL (ref 150–440)
RBC: 3.4 MIL/uL — AB (ref 3.80–5.20)
RDW: 17.6 % — AB (ref 11.5–14.5)
WBC: 8.3 10*3/uL (ref 3.6–11.0)

## 2018-05-14 LAB — GLUCOSE, CAPILLARY
GLUCOSE-CAPILLARY: 183 mg/dL — AB (ref 70–99)
GLUCOSE-CAPILLARY: 202 mg/dL — AB (ref 70–99)

## 2018-05-14 LAB — MRSA PCR SCREENING: MRSA BY PCR: NEGATIVE

## 2018-05-14 SURGERY — INCISION AND DRAINAGE, ABSCESS
Anesthesia: General | Site: Groin | Laterality: Right

## 2018-05-14 MED ORDER — MORPHINE SULFATE (PF) 2 MG/ML IV SOLN: 2.0000 mg | INTRAVENOUS | Status: DC | PRN

## 2018-05-14 MED ORDER — ENOXAPARIN SODIUM 40 MG/0.4ML ~~LOC~~ SOLN: 40.0000 mg | SUBCUTANEOUS | Status: DC

## 2018-05-14 MED ORDER — TRAMADOL HCL 50 MG PO TABS
50.0000 mg | ORAL_TABLET | Freq: Four times a day (QID) | ORAL | Status: DC | PRN
  Administered 2018-05-15: 50 mg via ORAL
  Filled 2018-05-14: qty 1

## 2018-05-14 MED ORDER — VANCOMYCIN HCL 500 MG IV SOLR
500.0000 mg | INTRAVENOUS | Status: DC
  Administered 2018-05-14: 1000 mg via INTRAVENOUS
  Administered 2018-05-16: 500 mg via INTRAVENOUS
  Filled 2018-05-14 (×2): qty 500

## 2018-05-14 MED ORDER — SODIUM CHLORIDE 0.9 % IV SOLN
INTRAVENOUS | Status: DC | PRN
  Administered 2018-05-14: 18:00:00 via INTRAVENOUS

## 2018-05-14 MED ORDER — ONDANSETRON HCL 4 MG/2ML IJ SOLN: 4.0000 mg | Freq: Once | INTRAMUSCULAR | Status: DC | PRN

## 2018-05-14 MED ORDER — FENTANYL CITRATE (PF) 100 MCG/2ML IJ SOLN
INTRAMUSCULAR | Status: DC | PRN
  Administered 2018-05-14 (×3): 25 ug via INTRAVENOUS

## 2018-05-14 MED ORDER — FENTANYL CITRATE (PF) 100 MCG/2ML IJ SOLN: 25.0000 ug | INTRAMUSCULAR | Status: DC | PRN

## 2018-05-14 MED ORDER — ONDANSETRON HCL 4 MG/2ML IJ SOLN
4.0000 mg | Freq: Four times a day (QID) | INTRAMUSCULAR | Status: DC | PRN
  Administered 2018-05-14: 4 mg via INTRAVENOUS

## 2018-05-14 MED ORDER — HEPARIN SODIUM (PORCINE) 5000 UNIT/ML IJ SOLN
5000.0000 [IU] | Freq: Three times a day (TID) | INTRAMUSCULAR | Status: DC
  Administered 2018-05-15 – 2018-05-19 (×12): 5000 [IU] via SUBCUTANEOUS
  Filled 2018-05-14 (×12): qty 1

## 2018-05-14 MED ORDER — LEVOTHYROXINE SODIUM 50 MCG PO TABS
50.0000 ug | ORAL_TABLET | Freq: Every day | ORAL | Status: DC
  Administered 2018-05-15 – 2018-05-19 (×5): 50 ug via ORAL
  Filled 2018-05-14 (×5): qty 1

## 2018-05-14 MED ORDER — DOCUSATE SODIUM 100 MG PO CAPS: 100.0000 mg | ORAL_CAPSULE | Freq: Two times a day (BID) | ORAL | Status: DC | PRN

## 2018-05-14 MED ORDER — LIDOCAINE HCL (CARDIAC) PF 100 MG/5ML IV SOSY
PREFILLED_SYRINGE | INTRAVENOUS | Status: DC | PRN
  Administered 2018-05-14: 50 mg via INTRAVENOUS

## 2018-05-14 MED ORDER — FENTANYL CITRATE (PF) 100 MCG/2ML IJ SOLN
INTRAMUSCULAR | Status: AC
  Filled 2018-05-14: qty 2

## 2018-05-14 MED ORDER — BUPIVACAINE HCL (PF) 0.5 % IJ SOLN
INTRAMUSCULAR | Status: AC
  Filled 2018-05-14: qty 30

## 2018-05-14 MED ORDER — AMLODIPINE BESYLATE 10 MG PO TABS
10.0000 mg | ORAL_TABLET | Freq: Every day | ORAL | Status: DC
  Administered 2018-05-14 – 2018-05-19 (×6): 10 mg via ORAL
  Filled 2018-05-14 (×6): qty 1

## 2018-05-14 MED ORDER — VANCOMYCIN HCL IN DEXTROSE 1-5 GM/200ML-% IV SOLN
1000.0000 mg | Freq: Once | INTRAVENOUS | Status: DC
  Filled 2018-05-14: qty 200

## 2018-05-14 MED ORDER — POLYETHYLENE GLYCOL 3350 17 G PO PACK
17.0000 g | PACK | Freq: Every day | ORAL | Status: DC
  Administered 2018-05-15 – 2018-05-19 (×3): 17 g via ORAL
  Filled 2018-05-14 (×4): qty 1

## 2018-05-14 MED ORDER — SUCCINYLCHOLINE CHLORIDE 20 MG/ML IJ SOLN
INTRAMUSCULAR | Status: DC | PRN
  Administered 2018-05-14: 50 mg via INTRAVENOUS

## 2018-05-14 MED ORDER — METOPROLOL TARTRATE 25 MG PO TABS
25.0000 mg | ORAL_TABLET | Freq: Two times a day (BID) | ORAL | Status: DC
  Administered 2018-05-14 – 2018-05-19 (×11): 25 mg via ORAL
  Filled 2018-05-14 (×11): qty 1

## 2018-05-14 MED ORDER — BUPIVACAINE HCL 0.5 % IJ SOLN
INTRAMUSCULAR | Status: DC | PRN
  Administered 2018-05-14: 10 mL

## 2018-05-14 MED ORDER — HYDROCODONE-ACETAMINOPHEN 5-325 MG PO TABS
1.0000 | ORAL_TABLET | ORAL | Status: DC | PRN
  Administered 2018-05-19: 2 via ORAL
  Filled 2018-05-14: qty 2

## 2018-05-14 MED ORDER — PHENYLEPHRINE HCL 10 MG/ML IJ SOLN
INTRAMUSCULAR | Status: DC | PRN
  Administered 2018-05-14 (×3): 100 ug via INTRAVENOUS

## 2018-05-14 MED ORDER — SENNOSIDES-DOCUSATE SODIUM 8.6-50 MG PO TABS
2.0000 | ORAL_TABLET | Freq: Two times a day (BID) | ORAL | Status: DC
  Administered 2018-05-14 – 2018-05-19 (×9): 2 via ORAL
  Filled 2018-05-14 (×10): qty 2

## 2018-05-14 MED ORDER — FAMOTIDINE 20 MG PO TABS
10.0000 mg | ORAL_TABLET | Freq: Two times a day (BID) | ORAL | Status: DC
  Administered 2018-05-14 – 2018-05-19 (×10): 10 mg via ORAL
  Filled 2018-05-14 (×10): qty 1

## 2018-05-14 MED ORDER — PROPOFOL 10 MG/ML IV BOLUS
INTRAVENOUS | Status: DC | PRN
  Administered 2018-05-14: 50 mg via INTRAVENOUS

## 2018-05-14 MED ORDER — TORSEMIDE 10 MG PO TABS
10.0000 mg | ORAL_TABLET | Freq: Every day | ORAL | Status: DC
  Administered 2018-05-15 – 2018-05-19 (×5): 10 mg via ORAL
  Filled 2018-05-14 (×6): qty 1

## 2018-05-14 MED ORDER — HYDRALAZINE HCL 50 MG PO TABS
50.0000 mg | ORAL_TABLET | Freq: Three times a day (TID) | ORAL | Status: DC
  Administered 2018-05-14 – 2018-05-18 (×12): 50 mg via ORAL
  Filled 2018-05-14 (×12): qty 1

## 2018-05-14 MED ORDER — ONDANSETRON 4 MG PO TBDP: 4.0000 mg | ORAL_TABLET | Freq: Four times a day (QID) | ORAL | Status: DC | PRN

## 2018-05-14 SURGICAL SUPPLY — 29 items
BLADE SURG 15 STRL LF DISP TIS (BLADE) ×1 IMPLANT
BLADE SURG 15 STRL SS (BLADE) ×2
CANISTER SUCT 1200ML W/VALVE (MISCELLANEOUS) ×3 IMPLANT
CHLORAPREP W/TINT 26ML (MISCELLANEOUS) ×3 IMPLANT
DRAPE LAPAROTOMY 100X77 ABD (DRAPES) ×3 IMPLANT
ELECT REM PT RETURN 9FT ADLT (ELECTROSURGICAL) ×3
ELECTRODE REM PT RTRN 9FT ADLT (ELECTROSURGICAL) ×1 IMPLANT
GLOVE BIO SURGEON STRL SZ 6.5 (GLOVE) ×2 IMPLANT
GLOVE BIO SURGEONS STRL SZ 6.5 (GLOVE) ×1
GLOVE BIOGEL PI IND STRL 7.0 (GLOVE) ×1 IMPLANT
GLOVE BIOGEL PI INDICATOR 7.0 (GLOVE) ×2
GLOVE SURG SYN 7.0 (GLOVE) ×6 IMPLANT
GOWN STRL REUS W/ TWL LRG LVL3 (GOWN DISPOSABLE) ×2 IMPLANT
GOWN STRL REUS W/TWL LRG LVL3 (GOWN DISPOSABLE) ×4
KIT DRSG VAC SLVR GRANUFM (MISCELLANEOUS) ×3 IMPLANT
LABEL OR SOLS (LABEL) ×3 IMPLANT
NEEDLE HYPO 22GX1.5 SAFETY (NEEDLE) ×3 IMPLANT
NS IRRIG 500ML POUR BTL (IV SOLUTION) ×3 IMPLANT
PACK BASIN MINOR ARMC (MISCELLANEOUS) ×3 IMPLANT
SPONGE XRAY 4X4 16PLY STRL (MISCELLANEOUS) ×3 IMPLANT
SUT MNCRL 4-0 (SUTURE) ×2
SUT MNCRL 4-0 27XMFL (SUTURE) ×1
SUT VIC AB 2-0 CT2 27 (SUTURE) ×3 IMPLANT
SUT VIC AB 3-0 SH 27 (SUTURE) ×2
SUT VIC AB 3-0 SH 27X BRD (SUTURE) ×1 IMPLANT
SUTURE MNCRL 4-0 27XMF (SUTURE) ×1 IMPLANT
SYR 10ML LL (SYRINGE) ×3 IMPLANT
TOWEL OR 17X26 4PK STRL BLUE (TOWEL DISPOSABLE) ×3 IMPLANT
WND VAC CANISTER 500ML (MISCELLANEOUS) ×3 IMPLANT

## 2018-05-14 NOTE — H&P (Signed)
  Subjective:   CC: Abdominal wall seroma, initial encounter [S30.1XXA] POSTOP  HPI: Tara [name] is a 84 y.o. female who is here for followup from above. Eating and voiding, having BM well. No change in symptoms since last visit.  Recheck after drainage site showed possibe infection.  States it maybe slightly smaller in size, still asymptomatic.  Current Medications: has a current medication list which includes the following prescription(s): acetaminophen, amlodipine, blood glucose diagnostic, fexofenadine, fluticasone propionate, fluticasone propion-salmeterol, hydralazine, insulin glargine, levothyroxine, meclizine, metoprolol succinate, pen needle, diabetic, pioglitazone, and aspirin.  Allergies:  Allergies  Allergen Reactions  . Ace Inhibitors Other (See Comments)  Angioedema  . Hydrochlorothiazide Unknown  Gout flare  . Maxzide [Triamterene-Hydrochlorothiazid] Other (See Comments)  Gout flare Gout flare  . Penicillins Unknown  . Trichlormethiazide Other (See Comments)  Gout flare  . Zestril [Lisinopril] Unknown   ROS: A 15 point review of systems was performed and pertinent positives and negatives noted in HPI  Objective:    BP 149/62  Pulse 67  Temp 36.8 C (98.2 F) (Oral)  Ht 144.8 cm (4\' 9" )  Wt (!) 37.2 kg (82 lb 0.2 oz)  BMI 17.75 kg/m   Constitutional : alert, appears stated age, cooperative and no distress  Gastrointestinal: soft, non-tender; bowel sounds normal; no masses, no organomegaly. Right groin incision intact. Increased size and erythema compared to exam two days ago.  Musculoskeletal: Steady gait and movement  Skin: Cool and moist, incision clean, dry, intact. No erythema, induration or drainage to indicate infection.  Psychiatric: Normal affect, non-agitated, not confused    LABS:  N/A   RADS:  Assessment:  Post op seroma, now likely infected.  Will proceed with I&D in office since initial bedside US shows superficial infection, no sign of  mesh involvement.  Plan:   I&D in office noted to have purulent drainage. Exploration noted mesh with visible view of field despite initial [place] views.  Procedure aborted at this point, wound dressed, and arrangements made to inpatient admit, pending formal debridement, likely mesh removal in OR.  Explained to patient and daughter at bedside that she was at a high chance for this type of complication from the original surgery due to the semi-urgent nature of it.  Initially it seemed like she will be doing okay with the sterile postop seroma but again that did not resolve on its own.  Prior to The needle aspiration we did discuss the remote possibility of introducing infection with the procedure at that time but due to the discomfort and total time present, we deemed it as an acceptable risks to attempt drainage.  Unfortunately she has now developed this infectious seroma that would need more formal debridement and likely mesh removal at this point.  To need to monitor closely and continue IV antibiotics while waiting OR in order to prevent further spread of this infection.  Patient and daughter were both understanding and verbalized agreement to the current plan.

## 2018-05-14 NOTE — Progress Notes (Signed)
Anticoagulation monitoring(Lovenox):  84 yo female ordered Lovenox 40 mg Q24H  Filed Weights   05/14/18 1257  Weight: 78 lb 0.7 oz (35.4 kg)   BMI   Lab Results  Component Value Date   CREATININE 1.49 (H) 05/14/2018   CREATININE 1.06 (H) 04/02/2018   CREATININE 0.99 03/23/2018   Estimated Creatinine Clearance: 11.8 mL/min (A) (by C-G formula based on SCr of 1.49 mg/dL (H)). Hemoglobin & Hematocrit     Component Value Date/Time   HGB 10.4 (L) 05/14/2018 1502   HGB 11.6 (L) 07/06/2014 0736   HCT 32.5 (L) 05/14/2018 1502   HCT 35.7 07/06/2014 0736     Per Protocol for Patient with estCrcl < 15 ml/min and BMI < 40, will transition to Heparin 5000 SQ Q8H.

## 2018-05-14 NOTE — Brief Op Note (Signed)
05/14/2018  7:45 PM  PATIENT:  Tara [name]  84 y.o. female  PRE-OPERATIVE DIAGNOSIS:  Right groin wound infection  POST-OPERATIVE DIAGNOSIS:  Right groin wound infection  PROCEDURE:  Procedure(s): INCISION AND DRAINAGE ABSCESS RIGHT GROIN (Right) APPLICATION OF WOUND VAC (Right)  Mesh explantation  SURGEON:  Surgeon(s) and Role:    Lysle Pearl, Tashan Kreitzer, DO - Primary  PHYSICIAN ASSISTANT: Cintron-Diaz  ASSISTANTS: none   ANESTHESIA:   general  EBL:  5 mL   BLOOD ADMINISTERED:none  DRAINS: none   LOCAL MEDICATIONS USED:  MARCAINE     SPECIMEN:  Source of Specimen:  right groin mesh  DISPOSITION OF SPECIMEN:  culture   COUNTS:  YES  TOURNIQUET:  * No tourniquets in log *  DICTATION: .Note written in EPIC  PLAN OF CARE: Admit to inpatient   PATIENT DISPOSITION:  PACU - hemodynamically stable.   Delay start of Pharmacological VTE agent (>24hrs) due to surgical blood loss or risk of bleeding: not applicable

## 2018-05-14 NOTE — Anesthesia Post-op Follow-up Note (Signed)
Anesthesia QCDR form completed.        

## 2018-05-14 NOTE — Progress Notes (Signed)
Pharmacy Antibiotic Note  Tara [name] is a 84 y.o. female admitted on 05/14/2018 with an abdominal wall seroma.  Pharmacy has been consulted for vancomycin dosing. This is a 84-yo female with PMH that includes HTN, Her SCr is elevated over her baseline level of approximately 1 mg/dL  Plan: Vancomycin 500mg  IV every 48 hours 72 hours after initial 1000mg  dose (stacked dosing)  Goal trough 15-20 mcg/mL. Vt ordered prior to the 4th dose. Calculated concentrations at steady-state: 35.2/18.2 mcg/mL. Pharmacy will monitor for changes in renal function that require dosing changes.  K: 0.014 Vd: 25L T1/2: 49h  Height: 4\' 10"  (147.3 cm) Weight: 78 lb 0.7 oz (35.4 kg) IBW/kg (Calculated) : 40.9  Allergies  Allergen Reactions  . Ace Inhibitors Other (See Comments)    Angioedema  . Hydrochlorothiazide W-Triamterene Other (See Comments)    Gout flare  . Lisinopril Swelling and Other (See Comments)  . Penicillins Swelling  . Trichlormethiazide Other (See Comments)    Gout flare    Antimicrobials this admission: Vancomycin 10/4 >>   Microbiology results: 10/4 MRSA PCR: negative  Thank you for allowing pharmacy to be a part of this patient's care.  Dallie Piles, PharmD 05/14/2018 2:50 PM

## 2018-05-14 NOTE — Progress Notes (Signed)
RN called MD to let him know pt has arrive to the hospital.

## 2018-05-14 NOTE — Anesthesia Procedure Notes (Signed)
Procedure Name: Intubation Date/Time: 05/14/2018 6:03 PM Performed by: Chanetta Marshall, CRNA Pre-anesthesia Checklist: Patient identified, Emergency Drugs available, Suction available and Patient being monitored Patient Re-evaluated:Patient Re-evaluated prior to induction Oxygen Delivery Method: Circle system utilized Preoxygenation: Pre-oxygenation with 100% oxygen Induction Type: IV induction Laryngoscope Size: Mac and 3 Grade View: Grade I Tube type: Oral Tube size: 7.0 mm Number of attempts: 1 Airway Equipment and Method: Stylet Placement Confirmation: ETT inserted through vocal cords under direct vision,  positive ETCO2,  CO2 detector and breath sounds checked- equal and bilateral Secured at: 20 cm Tube secured with: Tape Dental Injury: Teeth and Oropharynx as per pre-operative assessment

## 2018-05-14 NOTE — Progress Notes (Signed)
Patient second set of vitals [post op  will be late due to patient currently eating dinner.

## 2018-05-14 NOTE — Transfer of Care (Signed)
Immediate Anesthesia Transfer of Care Note  Patient: Tara [name]  Procedure(s) Performed: INCISION AND DRAINAGE ABSCESS RIGHT GROIN (Right Groin) APPLICATION OF WOUND VAC (Right Groin)  Patient Location: PACU  Anesthesia Type:General  Level of Consciousness: awake, alert  and oriented  Airway & Oxygen Therapy: Patient Spontanous Breathing and Patient connected to nasal cannula oxygen  Post-op Assessment: Report given to RN and Post -op Vital signs reviewed and stable  Post vital signs: Reviewed and stable  Last Vitals:  Vitals Value Taken Time  BP 160/63 05/14/2018  7:19 PM  Temp    Pulse 57 05/14/2018  7:20 PM  Resp 13 05/14/2018  7:20 PM  SpO2 99 % 05/14/2018  7:20 PM  Vitals shown include unvalidated device data.  Last Pain:  Vitals:   05/14/18 1257  TempSrc:   PainSc: 0-No pain         Complications: No apparent anesthesia complications

## 2018-05-14 NOTE — Anesthesia Preprocedure Evaluation (Signed)
Anesthesia Evaluation  Patient identified by MRN, date of birth, ID band Patient awake    Reviewed: Allergy & Precautions, NPO status , Patient's Chart, lab work & pertinent test results, reviewed documented beta blocker date and time   History of Anesthesia Complications Negative for: history of anesthetic complications  Airway Mallampati: IV       Dental  (+) Edentulous Upper, Edentulous Lower   Pulmonary asthma , neg sleep apnea, neg COPD,           Cardiovascular hypertension, Pt. on medications and Pt. on home beta blockers (-) Past MI, (-) Peripheral Vascular Disease and (-) CHF (-) dysrhythmias (-) Valvular Problems/Murmurs     Neuro/Psych neg Seizures negative neurological ROS  negative psych ROS   GI/Hepatic Neg liver ROS, neg GERD  ,  Endo/Other  diabetes, Type 2, Oral Hypoglycemic AgentsHypothyroidism   Renal/GU Renal InsufficiencyRenal diseasenegative Renal ROS     Musculoskeletal   Abdominal   Peds negative pediatric ROS (+)  Hematology  (+) anemia ,   Anesthesia Other Findings   Reproductive/Obstetrics                             Anesthesia Physical  Anesthesia Plan  ASA: III and emergent  Anesthesia Plan: General   Post-op Pain Management:    Induction: Intravenous  PONV Risk Score and Plan: 3  Airway Management Planned: Oral ETT  Additional Equipment:   Intra-op Plan:   Post-operative Plan: Extubation in OR  Informed Consent: I have reviewed the patients History and Physical, chart, labs and discussed the procedure including the risks, benefits and alternatives for the proposed anesthesia with the patient or authorized representative who has indicated his/her understanding and acceptance.     Plan Discussed with:   Anesthesia Plan Comments:         Anesthesia Quick Evaluation

## 2018-05-15 LAB — CBC WITH DIFFERENTIAL/PLATELET
Basophils Absolute: 0.1 10*3/uL (ref 0–0.1)
Basophils Relative: 1 %
Eosinophils Absolute: 0.1 10*3/uL (ref 0–0.7)
Eosinophils Relative: 2 %
HEMATOCRIT: 26.4 % — AB (ref 35.0–47.0)
Hemoglobin: 8.8 g/dL — ABNORMAL LOW (ref 12.0–16.0)
LYMPHS PCT: 28 %
Lymphs Abs: 1.6 10*3/uL (ref 1.0–3.6)
MCH: 31.1 pg (ref 26.0–34.0)
MCHC: 33.5 g/dL (ref 32.0–36.0)
MCV: 92.7 fL (ref 80.0–100.0)
MONO ABS: 0.8 10*3/uL (ref 0.2–0.9)
MONOS PCT: 14 %
NEUTROS ABS: 3.1 10*3/uL (ref 1.4–6.5)
Neutrophils Relative %: 55 %
Platelets: 295 10*3/uL (ref 150–440)
RBC: 2.84 MIL/uL — ABNORMAL LOW (ref 3.80–5.20)
RDW: 16.5 % — ABNORMAL HIGH (ref 11.5–14.5)
WBC: 5.7 10*3/uL (ref 3.6–11.0)

## 2018-05-15 LAB — BASIC METABOLIC PANEL
Anion gap: 8 (ref 5–15)
BUN: 44 mg/dL — AB (ref 8–23)
CO2: 25 mmol/L (ref 22–32)
CREATININE: 1.48 mg/dL — AB (ref 0.44–1.00)
Calcium: 7.9 mg/dL — ABNORMAL LOW (ref 8.9–10.3)
Chloride: 105 mmol/L (ref 98–111)
GFR calc Af Amer: 33 mL/min — ABNORMAL LOW (ref >60)
GFR calc non Af Amer: 28 mL/min — ABNORMAL LOW (ref >60)
GLUCOSE: 169 mg/dL — AB (ref 70–99)
Potassium: 4.8 mmol/L (ref 3.5–5.1)
Sodium: 138 mmol/L (ref 135–145)

## 2018-05-15 LAB — PHOSPHORUS: Phosphorus: 4.2 mg/dL (ref 2.5–4.6)

## 2018-05-15 LAB — MAGNESIUM: Magnesium: 2.1 mg/dL (ref 1.7–2.4)

## 2018-05-15 MED ORDER — CLONIDINE HCL 0.2 MG/24HR TD PTWK
0.2000 mg | MEDICATED_PATCH | TRANSDERMAL | Status: DC
  Administered 2018-05-15: 0.2 mg via TRANSDERMAL
  Filled 2018-05-15: qty 1

## 2018-05-15 MED ORDER — SODIUM CHLORIDE 0.9 % IV SOLN
1.0000 g | INTRAVENOUS | Status: DC
  Administered 2018-05-15 – 2018-05-18 (×4): 1 g via INTRAVENOUS
  Filled 2018-05-15 (×5): qty 1

## 2018-05-15 MED ORDER — SODIUM CHLORIDE 0.9 % IV SOLN
INTRAVENOUS | Status: DC | PRN
  Administered 2018-05-15 – 2018-05-18 (×4): 250 mL via INTRAVENOUS

## 2018-05-15 NOTE — Progress Notes (Signed)
POd # 1 S/p excision of Right femoral mesh wound vac Doing well Minimal pain Taking po AVSs  PE NAD Abd: soft, nt, wound vac in place, no evidence of necrotizing infection or collections  A/p Continue a/bs Pending ID consult Arrange home vac

## 2018-05-15 NOTE — Care Management (Signed)
Dietitian consult requested for wound.

## 2018-05-15 NOTE — Consult Note (Signed)
NAME: Tara [name]  DOB: 11/29/1917  MRN: [id number]  Date/Time: 05/15/2018 5:31 PM   Dr. Lysle Pearl Subjective:  REASON FOR CONSULT: Surgical site infection ? Tara [name] is a 84 y.o. female with a history right incarcerated femoral  hernia status post repair on 03/09/2018 with placement of mesh, diabetes mellitus is admitted to the hospital with swelling at the site of the previous hernia surgery was admitted on 05/14/2018 for surgery.  Patient was in [facility] between 03/01/2018 and 03/16/2018 for incarcerated femoral hernia which initially was managed conservatively and then had to be taken for surgery by Dr. Lysle Pearl on 03/09/2018.  She initially went to rehab and then went home.  She was followed by Dr. Lysle Pearl as outpatient and he noted a lump present on the right inguinal area and she had a CT of the pelvis on 04/02/2018 which noted 8 cm seroma.  As it was not improving a she had another CT of the abdomen done on 05/03/2018 which showed a large postoperative fluid collection in the right inguinal region compatible with seroma.she had an I&D done in his office and purulent drainage was noted and as the mesh was visible she  was admitted to the hospital.  She had surgery on 05/14/2018 and the right groin mesh was removed.  She is currently now on vancomycin and I am asked to see her for antibiotic management.  Patient does not have any fever or chills.  She does not have any nausea or vomiting.  She has some pain at the site of the surgery.  She has a wound VAC now.  She lives on her own but now her daughters help her.  In March 2019 she fell and was in the hospital for couple of days between the 22nd-25th of 2019 and during that time it was noted that she had a closed fracture of the ramus of the left pubis.  And contusion of the knee with effusion.  Managed conservatively Past Medical History:  Diagnosis Date  . Asthma   . Diabetes mellitus ([place])   . Environmental allergies   . Hypertension     Past  Surgical History:  Procedure Laterality Date  . APPENDECTOMY    . INGUINAL HERNIA REPAIR  03/09/2018   Procedure: HERNIA REPAIR INGUINAL INCARCERATED;  Surgeon: Benjamine Sprague, DO;  Location: [facility] ORS;  Service: General;;  . INSERTION OF MESH  03/09/2018   Procedure: INSERTION OF MESH;  Surgeon: Benjamine Sprague, DO;  Location: [facility] ORS;  Service: General;;  . LAPAROSCOPY N/A 03/09/2018   Procedure: LAPAROSCOPY DIAGNOSTIC;  Surgeon: Benjamine Sprague, DO;  Location: [facility] ORS;  Service: General;  Laterality: N/A;  . TUBAL LIGATION      Social history Lives at home Daughter's helper Non-smoker, no alcohol Family History  Problem Relation Age of Onset  . Hypertension Mother   . Cancer Brother        lung (question)  . Cancer Sister        questionable type   Allergies  Allergen Reactions  . Ace Inhibitors Other (See Comments)    Angioedema  . Hydrochlorothiazide W-Triamterene Other (See Comments)    Gout flare  . Lisinopril Swelling and Other (See Comments)  . Penicillins Swelling  . Trichlormethiazide Other (See Comments)    Gout flare   ? Current Facility-Administered Medications  Medication Dose Route Frequency Provider Last Rate Last Dose  . amLODipine (NORVASC) tablet 10 mg  10 mg Oral Daily Sakai, Isami, DO  10 mg at 05/15/18 0901  . ceFEPIme (MAXIPIME) 1 g in sodium chloride 0.9 % 100 mL IVPB  1 g Intravenous Q24H Hallaji, Sheema M, RPH      . cloNIDine (CATAPRES - Dosed in mg/24 hr) patch 0.2 mg  0.2 mg Transdermal Weekly Pabon, Diego F, MD      . docusate sodium (COLACE) capsule 100 mg  100 mg Oral BID PRN Sakai, Isami, DO      . famotidine (PEPCID) tablet 10 mg  10 mg Oral BID Sakai, Isami, DO   10 mg at 05/15/18 0901  . heparin injection 5,000 Units  5,000 Units Subcutaneous Q8H Sakai, Isami, DO   5,000 Units at 05/15/18 1357  . hydrALAZINE (APRESOLINE) tablet 50 mg  50 mg Oral TID Lysle Pearl, Isami, DO   50 mg at 05/15/18 0902  . HYDROcodone-acetaminophen (NORCO/VICODIN) 5-325 MG  per tablet 1-2 tablet  1-2 tablet Oral Q4H PRN Lysle Pearl, Isami, DO      . levothyroxine (SYNTHROID, LEVOTHROID) tablet 50 mcg  50 mcg Oral QAC breakfast Sakai, Isami, DO   50 mcg at 05/15/18 0901  . metoprolol tartrate (LOPRESSOR) tablet 25 mg  25 mg Oral BID Sakai, Isami, DO   25 mg at 05/15/18 0902  . morphine 2 MG/ML injection 2 mg  2 mg Intravenous Q4H PRN Sakai, Isami, DO      . ondansetron (ZOFRAN-ODT) disintegrating tablet 4 mg  4 mg Oral Q6H PRN Lysle Pearl, Isami, DO       Or  . ondansetron (ZOFRAN) injection 4 mg  4 mg Intravenous Q6H PRN Sakai, Isami, DO   4 mg at 05/14/18 1855  . polyethylene glycol (MIRALAX / GLYCOLAX) packet 17 g  17 g Oral Daily Sakai, Isami, DO   17 g at 05/15/18 0902  . senna-docusate (Senokot-S) tablet 2 tablet  2 tablet Oral BID Lysle Pearl, Isami, DO   2 tablet at 05/15/18 0901  . torsemide (DEMADEX) tablet 10 mg  10 mg Oral Daily Sakai, Isami, DO   10 mg at 05/15/18 0906  . traMADol (ULTRAM) tablet 50 mg  50 mg Oral Q6H PRN Lysle Pearl, Isami, DO      . [START ON 05/16/2018] vancomycin (VANCOCIN) 500 mg in sodium chloride 0.9 % 100 mL IVPB  500 mg Intravenous Q48H Sakai, Isami, DO   1,000 mg at 05/14/18 1807  . vancomycin (VANCOCIN) IVPB 1000 mg/200 mL premix  1,000 mg Intravenous Once Sakai, Isami, DO        REVIEW OF SYSTEMS:  Const: negative fever, negative chills, negative weight loss Eyes: negative diplopia or visual changes, negative eye pain ENT: negative coryza, negative sore throat, hard of hearing Resp: negative cough, hemoptysis, dyspnea Cards: negative for chest pain, palpitations, lower extremity edema GU: negative for frequency, dysuria and hematuria GI: Pain at the right inguinal area, no diarrhea, bleeding, constipation Skin: negative for rash and pruritus Heme: negative for easy bruising and gum/nose bleeding MS: Pale with muscle weakness Neurolo:negative for headaches, dizziness, vertigo, memory problems  Psych: negative for feelings of anxiety, depression   Endocrine: negative for thyroid, diabetes Allergy/Immunology-history of penicillin allergy many years ago had swelling she states Objective:  VITALS:  BP (!) 160/67 (BP Location: Left Arm)   Pulse 71   Temp 98.4 F (36.9 C) (Oral)   Resp (!) 21   Ht 4\' 10"  (1.473 m)   Wt 35.4 kg   SpO2 99%   BMI 16.31 kg/m  PHYSICAL EXAM:  General: Alert, cooperative, no distress, appears  stated age.  Frail Head: Normocephalic, without obvious abnormality, atraumatic. Eyes: Conjunctivae clear, anicteric sclerae. Pupils are equal ENT Nares normal. No drainage or sinus tenderness. Lips, mucosa, and tongue normal. No Thrush Neck: Supple, symmetrical, no adenopathy, thyroid: non tender no carotid bruit and no JVD. Back: Not examined Lungs: Bilateral air entry  heart: S1-S2 Abdomen: Soft, non-tender,not distended. Bowel sounds normal. No masses, right inguinal area wound VAC Extremities: atraumatic, no cyanosis. No edema. No clubbing, scar over the right ankle Skin: No rashes or lesions. Or bruising Lymph: Cervical, supraclavicular normal. Neurologic: Grossly non-focal Pertinent Labs CBC Latest Ref Rng & Units 05/15/2018 05/14/2018 04/02/2018  WBC 3.6 - 11.0 K/uL 5.7 8.3 5.3  Hemoglobin 12.0 - 16.0 g/dL 8.8(L) 10.4(L) 9.2(L)  Hematocrit 35.0 - 47.0 % 26.4(L) 32.5(L) 27.1(L)  Platelets 150 - 440 K/uL 295 307 321   CMP Latest Ref Rng & Units 05/15/2018 05/14/2018 04/02/2018  Glucose 70 - 99 mg/dL 169(H) 181(H) 153(H)  BUN 8 - 23 mg/dL 44(H) 42(H) 47(H)  Creatinine 0.44 - 1.00 mg/dL 1.48(H) 1.49(H) 1.06(H)  Sodium 135 - 145 mmol/L 138 136 140  Potassium 3.5 - 5.1 mmol/L 4.8 4.7 4.4  Chloride 98 - 111 mmol/L 105 102 102  CO2 22 - 32 mmol/L 25 22 28   Calcium 8.9 - 10.3 mg/dL 7.9(L) 8.3(L) 7.8(L)  Total Protein 6.5 - 8.1 g/dL - - -  Total Bilirubin 0.3 - 1.2 mg/dL - - -  Alkaline Phos 38 - 126 U/L - - -  AST 15 - 41 U/L - - -  ALT 0 - 44 U/L - - -    IMAGING RESULTS: Personally reviewed CT  pelvis and abdomen done on 05/03/2018 ?No evidence of a recurrent inguinal hernia. Stable large postoperative fluid collection in the right inguinal region compatible with seroma. 2. No evidence of bowel obstruction or acute bowel inflammation. Moderate to large volume stool in the right and transverse colon, which may indicate constipation.  Impression/Recommendation ? 84 y.o. female with a history right incarcerated femoral  hernia status post repair on 03/09/2018 with placement of mesh, diabetes mellitus is admitted to the hospital with swelling at the site of the previous hernia surgery was admitted on 05/14/2018 for surgery.  Patient was in [facility] between 03/01/2018 and 03/16/2018 for incarcerated femoral hernia which initially was managed conservatively and then had to be taken for surgery by Dr. Lysle Pearl on 03/09/2018.   ? ? Right femoral hernia repair surgical site infection infected seroma. Status post surgery with removal of the mesh and wound VAC placement.  Currently on vancomycin.  We will add to cefepime for gram-negative coverage. Need to evaluate the wound when the wound VAC is being changed.  The duration, route, or type of antibiotic will depend on the culture report as well as the depth of the infection.   Recent incarcerated right femoral hernia status post repair on July 30  Diabetes mellitus _the meds in the hospital.  Hypothyroidism on Synthroid __________________________________________________ Discussed with patient, and her daughter and requesting provider

## 2018-05-15 NOTE — Progress Notes (Signed)
Verbal order to restart home catapres patch, pt is due for a new patch today. MD also made aware that sheet for wound vac measurements is in the chart and needs to be filled out.   Vikrant Pryce [employer]

## 2018-05-15 NOTE — Progress Notes (Addendum)
Pharmacy Antibiotic Note  ANALICIA SKIBINSKI is a 84 y.o. female admitted on 05/14/2018 with an abdominal wall seroma.  Pharmacy has been consulted for vancomycin dosing. This is a 66-yo female with PMH that includes HTN, Her SCr is elevated over her baseline level of approximately 1 mg/dL  10/5 Pharmacy consulted for cefepime dosing. Patient has a reported allergy of swelling to PCN, but patient has tolerated cephalosporins in the past.    Plan: Vancomycin 500mg  IV every 48 hours 72 hours after initial 1000mg  dose (stacked dosing)  Goal trough 15-20 mcg/mL. Vt ordered prior to the 4th dose. Calculated concentrations at steady-state: 35.2/18.2 mcg/mL. Pharmacy will monitor for changes in renal function that require dosing changes.  K: 0.014 Vd: 25L T1/2: 49h  Start Cefepime 1g IV every 24 hours.   Height: 4\' 10"  (147.3 cm) Weight: 78 lb 0.7 oz (35.4 kg) IBW/kg (Calculated) : 40.9  Allergies  Allergen Reactions  . Ace Inhibitors Other (See Comments)    Angioedema  . Hydrochlorothiazide W-Triamterene Other (See Comments)    Gout flare  . Lisinopril Swelling and Other (See Comments)  . Penicillins Swelling  . Trichlormethiazide Other (See Comments)    Gout flare    Antimicrobials this admission: Vancomycin 10/4 >>  Cefepime 10/5 >> Microbiology results: 10/4 MRSA PCR: negative  Thank you for allowing pharmacy to be a part of this patient's care.  Pernell Dupre, PharmD, BCPS Clinical Pharmacist 05/15/2018 2:49 PM

## 2018-05-15 NOTE — Care Management (Signed)
RNCM received call from surgeon requesting home health nursing and wound vac without preference of companies.  I spoke with patient's daughter and she would like to use [facility] as patient just had them for home PT.  I have made referral to [facility] with [facility] for [facility] and home health.

## 2018-05-16 LAB — BASIC METABOLIC PANEL
Anion gap: 7 (ref 5–15)
BUN: 40 mg/dL — ABNORMAL HIGH (ref 8–23)
CO2: 24 mmol/L (ref 22–32)
Calcium: 8.1 mg/dL — ABNORMAL LOW (ref 8.9–10.3)
Chloride: 107 mmol/L (ref 98–111)
Creatinine, Ser: 1.54 mg/dL — ABNORMAL HIGH (ref 0.44–1.00)
GFR calc Af Amer: 31 mL/min — ABNORMAL LOW (ref >60)
GFR calc non Af Amer: 27 mL/min — ABNORMAL LOW (ref >60)
Glucose, Bld: 166 mg/dL — ABNORMAL HIGH (ref 70–99)
Potassium: 4.5 mmol/L (ref 3.5–5.1)
Sodium: 138 mmol/L (ref 135–145)

## 2018-05-16 LAB — CBC WITH DIFFERENTIAL/PLATELET
Basophils Absolute: 0 10*3/uL (ref 0–0.1)
Basophils Relative: 1 %
EOS ABS: 0.1 10*3/uL (ref 0–0.7)
Eosinophils Relative: 2 %
HCT: 27 % — ABNORMAL LOW (ref 35.0–47.0)
Hemoglobin: 9.2 g/dL — ABNORMAL LOW (ref 12.0–16.0)
Lymphocytes Relative: 37 %
Lymphs Abs: 2.3 10*3/uL (ref 1.0–3.6)
MCH: 31.1 pg (ref 26.0–34.0)
MCHC: 33.9 g/dL (ref 32.0–36.0)
MCV: 91.9 fL (ref 80.0–100.0)
MONO ABS: 0.8 10*3/uL (ref 0.2–0.9)
MONOS PCT: 13 %
Neutro Abs: 3 10*3/uL (ref 1.4–6.5)
Neutrophils Relative %: 47 %
PLATELETS: 329 10*3/uL (ref 150–440)
RBC: 2.94 MIL/uL — ABNORMAL LOW (ref 3.80–5.20)
RDW: 16.3 % — AB (ref 11.5–14.5)
WBC: 6.2 10*3/uL (ref 3.6–11.0)

## 2018-05-16 LAB — MAGNESIUM: Magnesium: 2.2 mg/dL (ref 1.7–2.4)

## 2018-05-16 LAB — PHOSPHORUS: Phosphorus: 3.6 mg/dL (ref 2.5–4.6)

## 2018-05-16 MED ORDER — ENSURE ENLIVE PO LIQD
237.0000 mL | Freq: Two times a day (BID) | ORAL | Status: DC
  Administered 2018-05-16 – 2018-05-19 (×5): 237 mL via ORAL

## 2018-05-16 MED ORDER — OCUVITE-LUTEIN PO CAPS
1.0000 | ORAL_CAPSULE | Freq: Every day | ORAL | Status: DC
  Administered 2018-05-17 – 2018-05-19 (×3): 1 via ORAL
  Filled 2018-05-16 (×4): qty 1

## 2018-05-16 NOTE — Care Management (Signed)
Jermaine with [facility] notified that [facility] form is ready for authorization. Jermaine said there would not be any issues for patient and that it will be delivered to patient tomorrow.

## 2018-05-16 NOTE — Progress Notes (Signed)
Initial Nutrition Assessment  DOCUMENTATION CODES:   Severe malnutrition in context of chronic illness, Underweight  INTERVENTION:  Will liberalize diet to regular.  Provide Ensure Enlive po BID, each supplement provides 350 kcal and 20 grams of protein.  Provide Ocuvite daily for wound healing (provides zinc, vitamin A, vitamin C, Vitamin E, copper, and selenium).  Encouraged patient to choose a source of protein at every meal and snack.  NUTRITION DIAGNOSIS:   Severe Malnutrition related to chronic illness(advanced age, recent LOA and open femoral hernia repair 7/30, wound infection, decreased intake since surgery) as evidenced by severe fat depletion, severe muscle depletion, per patient/family report.  GOAL:   Patient will meet greater than or equal to 90% of their needs  MONITOR:   PO intake, Supplement acceptance, Labs, Weight trends, Skin, I & O's  REASON FOR ASSESSMENT:   Consult Wound healing  ASSESSMENT:   84 year old female with PMHx of asthma, DM, HTN, recent diagnostic laparoscopy, lysis of adhesions, and open femoral hernia repair with mesh on 03/09/2018 for intraabdominal adhesions and incarcerated right femoral hernia who is now admitted with right groin wound infection s/p incision and drainage of right groin abscess and application of wound VAC on 05/14/2018.   Met with patient and her daughter at bedside. Patient is known to this RD from her admission 7/22-8/6 where patient was initially on TPN but was able to transition to a GI soft diet with Ensure Enlive BID prior to discharge. Patient reports her appetite has been decreased after discharge. She still attempts to eat 2-3 meals per day but is eating very small amounts at meals. Her daughter reports she only eats a few bites at meals before stopping. Patient reports she is still drinking Ensure, but is unsure which type or how many she is having per day.  Patient's UBW was 90 lbs (40.9 kg). Patient's weight was  falsely increased during recent admission likely due to volume overload. She was 39.5 kg on 03/31/2018 and is currently 35.4 kg (78.04 lbs). She has lost 4.1 kg (10.4% body weight) over the past 1.5 months, which is significant for time frame.  Medications reviewed and include: famotidine, levothyroxine, Miralax, torsemide, cefepime, vancomycin.  Labs reviewed: CBG 202, BUN 40, Creatinine 1.54, eGFR 31. Potassium, Magnesium, and Phosphorus are all WNL.  Discussed with Case Management. RD assessment needed for insurance purposes. Discussed with Surgeon. Okay to liberalize diet and order supplements.  NUTRITION - FOCUSED PHYSICAL EXAM:    Most Recent Value  Orbital Region  Severe depletion  Upper Arm Region  Severe depletion  Thoracic and Lumbar Region  Severe depletion  Buccal Region  Severe depletion  Temple Region  Severe depletion  Clavicle Bone Region  Severe depletion  Clavicle and Acromion Bone Region  Severe depletion  Scapular Bone Region  Severe depletion  Dorsal Hand  Severe depletion  Patellar Region  Severe depletion  Anterior Thigh Region  Severe depletion  Posterior Calf Region  Severe depletion  Edema (RD Assessment)  None  Hair  Reviewed  Eyes  Reviewed  Mouth  Reviewed  Skin  Reviewed  Nails  Reviewed     Diet Order:   Diet Order            Diet Heart Room service appropriate? Yes; Fluid consistency: Thin  Diet effective now              EDUCATION NEEDS:   Education needs have been addressed  Skin:  Skin Assessment: Skin Integrity  Issues:(closed incision to right groin; no measurements for wound at this time)  Last BM:  05/15/2018 - large type 5  Height:   Ht Readings from Last 1 Encounters:  05/14/18 4' 10"  (1.473 m)    Weight:   Wt Readings from Last 1 Encounters:  05/14/18 35.4 kg    Ideal Body Weight:  43.9 kg  BMI:  Body mass index is 16.31 kg/m.  Estimated Nutritional Needs:   Kcal:  1683-7290  Protein:  55-65 grams  Fluid:   1.2-1.4 L/day  Willey Blade, MS, RD, LDN Office: [phone] Pager: [phone] After Hours/Weekend Pager: [phone]

## 2018-05-16 NOTE — Progress Notes (Signed)
POD # 2 Doing well AVSS + PO Gram neg rods  Appreciate ID input vanc and cefepime  PE NAD Abd: soft, wound vac in place  A/p Doing well awaiting final cultures to decide on outpt antibiotic rx Filled wound vac papers No new surgical issues

## 2018-05-17 ENCOUNTER — Encounter: Payer: Self-pay | Admitting: Surgery

## 2018-05-17 LAB — CBC WITH DIFFERENTIAL/PLATELET
Basophils Absolute: 0.3 10*3/uL — ABNORMAL HIGH (ref 0–0.1)
Basophils Relative: 5 %
Eosinophils Absolute: 0.1 10*3/uL (ref 0–0.7)
Eosinophils Relative: 1 %
HEMATOCRIT: 29.3 % — AB (ref 35.0–47.0)
Hemoglobin: 9.9 g/dL — ABNORMAL LOW (ref 12.0–16.0)
Lymphocytes Relative: 32 %
Lymphs Abs: 2 10*3/uL (ref 1.0–3.6)
MCH: 31.2 pg (ref 26.0–34.0)
MCHC: 33.7 g/dL (ref 32.0–36.0)
MCV: 92.4 fL (ref 80.0–100.0)
MONO ABS: 0.9 10*3/uL (ref 0.2–0.9)
Monocytes Relative: 14 %
NEUTROS ABS: 3 10*3/uL (ref 1.4–6.5)
Neutrophils Relative %: 48 %
Platelets: 411 10*3/uL (ref 150–440)
RBC: 3.17 MIL/uL — ABNORMAL LOW (ref 3.80–5.20)
RDW: 16.4 % — AB (ref 11.5–14.5)
WBC: 6.2 10*3/uL (ref 3.6–11.0)

## 2018-05-17 LAB — BASIC METABOLIC PANEL
Anion gap: 9 (ref 5–15)
BUN: 30 mg/dL — ABNORMAL HIGH (ref 8–23)
CALCIUM: 8.6 mg/dL — AB (ref 8.9–10.3)
CO2: 26 mmol/L (ref 22–32)
Chloride: 106 mmol/L (ref 98–111)
Creatinine, Ser: 1.37 mg/dL — ABNORMAL HIGH (ref 0.44–1.00)
GFR calc non Af Amer: 31 mL/min — ABNORMAL LOW (ref >60)
GFR, EST AFRICAN AMERICAN: 36 mL/min — AB (ref >60)
Glucose, Bld: 197 mg/dL — ABNORMAL HIGH (ref 70–99)
Potassium: 4.3 mmol/L (ref 3.5–5.1)
Sodium: 141 mmol/L (ref 135–145)

## 2018-05-17 LAB — PHOSPHORUS: Phosphorus: 3.6 mg/dL (ref 2.5–4.6)

## 2018-05-17 LAB — MAGNESIUM: Magnesium: 2 mg/dL (ref 1.7–2.4)

## 2018-05-17 MED ORDER — ACETAMINOPHEN 325 MG PO TABS
650.0000 mg | ORAL_TABLET | Freq: Four times a day (QID) | ORAL | Status: DC | PRN
  Administered 2018-05-17 – 2018-05-19 (×6): 650 mg via ORAL
  Filled 2018-05-17 (×6): qty 2

## 2018-05-17 NOTE — Progress Notes (Addendum)
Subjective:  CC:  Tara [name] is a 84 y.o. female  Hospital stay day 3, 3 Days Post-Op open removal of infected femoral hernia mesh  HPI: No issues overnight.  ROS:  A 5 point review of systems was performed and pertinent positives and negatives noted in HPI.   Objective:      Temp:  [97.5 F (36.4 C)-98.5 F (36.9 C)] 98 F (36.7 C) (10/07 0756) Pulse Rate:  [67-72] 72 (10/07 0756) Resp:  [17-20] 20 (10/07 0756) BP: (122-186)/(55-80) 140/80 (10/07 0756) SpO2:  [97 %-100 %] 99 % (10/07 0756)     Height: 4\' 10"  (147.3 cm) Weight: 35.4 kg BMI (Calculated): 16.32   Intake/Output this shift:  No intake/output data recorded.       Constitutional :  alert, cooperative, appears stated age and no distress  Respiratory:  clear to auscultation bilaterally  Cardiovascular:  regular rate and rhythm  Gastrointestinal: soft, non-tender; bowel sounds normal; no masses,  no organomegaly.   Skin: Cool and moist. Wound vac in place.  Psychiatric: Normal affect, non-agitated, not confused       LABS:  CMP Latest Ref Rng & Units 05/17/2018 05/16/2018 05/15/2018  Glucose 70 - 99 mg/dL 197(H) 166(H) 169(H)  BUN 8 - 23 mg/dL 30(H) 40(H) 44(H)  Creatinine 0.44 - 1.00 mg/dL 1.37(H) 1.54(H) 1.48(H)  Sodium 135 - 145 mmol/L 141 138 138  Potassium 3.5 - 5.1 mmol/L 4.3 4.5 4.8  Chloride 98 - 111 mmol/L 106 107 105  CO2 22 - 32 mmol/L 26 24 25   Calcium 8.9 - 10.3 mg/dL 8.6(L) 8.1(L) 7.9(L)  Total Protein 6.5 - 8.1 g/dL - - -  Total Bilirubin 0.3 - 1.2 mg/dL - - -  Alkaline Phos 38 - 126 U/L - - -  AST 15 - 41 U/L - - -  ALT 0 - 44 U/L - - -   CBC Latest Ref Rng & Units 05/17/2018 05/16/2018 05/15/2018  WBC 3.6 - 11.0 K/uL 6.2 6.2 5.7  Hemoglobin 12.0 - 16.0 g/dL 9.9(L) 9.2(L) 8.8(L)  Hematocrit 35.0 - 47.0 % 29.3(L) 27.0(L) 26.4(L)  Platelets 150 - 440 K/uL 411 329 295    RADS: n/a Assessment:   Hospital stay day 3, 3 Days Post-Op open removal of infected femoral hernia mesh.  Doing  well.   Pending cultures for outpatient abx regmien per ID.  Will continue IV abx until then.    Wound care RN consult placed for wound vac changes while inhouse.  Paperwork completed for outpt vac when ready for d/c. ID provider will like to see wound during dressing change.  Please take pic of wound or call her at [phone]  Increase Cr level.  Decreasing.  Continue to monitor for now.  D/c once outpt abx regimen recommendations placed by ID.

## 2018-05-17 NOTE — Op Note (Addendum)
Preoperative diagnosis: Right groin abscess at femoral hernia repair site Postoperative diagnosis: same  Procedure: Incision and drainage of right groin abscess, with femoral site hernia mesh removal  Anesthesia: LMA  Surgeon: Lysle Pearl  Wound Classification: Contaminated  Indications: Patient is a 84 y.o. female  presented with above.  See H&P for further details.  Specimen: femoral hernia mesh  Complications: None  Estimated Blood Loss: 52mL  Findings:  1. Large abscess cavity with necrotic tissue and purulent material extending around mesh.  Post mesh removal indicated no persistent femoral hernia.   Description of procedure: The patient was placed in the supine position and general anesthesia was induced. The area was prepped and draped in the usual sterile fashion after initial packing was removed. A timeout was completed verifying correct patient, procedure, site, positioning, and implant(s) and/or special equipment prior to beginning this procedure.  Extensive debridement of necrotic tissue was then performed down to the area where the mesh was noted to be intact but surrounded by necrotic tissue and purulent material.  Due to its proximity of the femoral vessels lateral to it, very meticulous dissection and removal of the mesh performed starting from the medial aspect until it was fully removed without any obvious injury to the vessels.  Scant mesh material was noted at the deepest portion, where the extensive scarring that has already occurred prevented it from being removed completely without potential of injury to the surrounding area.  Extensive debridement of the surrounding necrotic tissue in the subcu and fascial layers was then completed in the visible operative field.  Wound measured approximately 12 cm x 8 cm x 4 cm deep.  After confirming adequate hemostasis no further necrotic tissue, wound was extensively irrigated and a wound VAC was placed.  Mesh was sent to pathology for  cultures. The patient tolerated the procedure well and was taken to the postanesthesia care unit in satisfactory condition.  Sponge and instrument count correct at the end of the procedure as well

## 2018-05-17 NOTE — Anesthesia Postprocedure Evaluation (Signed)
Anesthesia Post Note  Patient: Tara [name]  Procedure(s) Performed: INCISION AND DRAINAGE ABSCESS RIGHT GROIN (Right Groin) APPLICATION OF WOUND VAC (Right Groin)  Patient location during evaluation: PACU Anesthesia Type: General Level of consciousness: awake and alert and oriented Pain management: pain level controlled Vital Signs Assessment: post-procedure vital signs reviewed and stable Respiratory status: spontaneous breathing Cardiovascular status: blood pressure returned to baseline Anesthetic complications: no     Last Vitals:  Vitals:   05/17/18 2016 05/17/18 2038  BP: (!) 129/38 (!) 142/41  Pulse: 73 72  Resp: 18   Temp: 36.9 C   SpO2: 98%     Last Pain:  Vitals:   05/17/18 2016  TempSrc: Oral  PainSc:                  [name]

## 2018-05-17 NOTE — Progress Notes (Signed)
ID Pt doing well No specific complaints Rt wound covered with wound vac  Pertinent Labs  CBC Latest Ref Rng & Units 05/17/2018 05/16/2018 05/15/2018  WBC 3.6 - 11.0 K/uL 6.2 6.2 5.7  Hemoglobin 12.0 - 16.0 g/dL 9.9(L) 9.2(L) 8.8(L)  Hematocrit 35.0 - 47.0 % 29.3(L) 27.0(L) 26.4(L)  Platelets 150 - 440 K/uL 411 329 295   CMP Latest Ref Rng & Units 05/17/2018 05/16/2018 05/15/2018  Glucose 70 - 99 mg/dL 197(H) 166(H) 169(H)  BUN 8 - 23 mg/dL 30(H) 40(H) 44(H)  Creatinine 0.44 - 1.00 mg/dL 1.37(H) 1.54(H) 1.48(H)  Sodium 135 - 145 mmol/L 141 138 138  Potassium 3.5 - 5.1 mmol/L 4.3 4.5 4.8  Chloride 98 - 111 mmol/L 106 107 105  CO2 22 - 32 mmol/L 26 24 25   Calcium 8.9 - 10.3 mg/dL 8.6(L) 8.1(L) 7.9(L)  Total Protein 6.5 - 8.1 g/dL - - -  Total Bilirubin 0.3 - 1.2 mg/dL - - -  Alkaline Phos 38 - 126 U/L - - -  AST 15 - 41 U/L - - -  ALT 0 - 44 U/L - - -   Culture from the mesh- staph epi Await finalization IMAGING RESULTS:  Personally reviewed CT pelvis and abdomen done on 05/03/2018  ?No evidence of a recurrent inguinal hernia. Stable large  postoperative fluid collection in the right inguinal region  compatible with seroma.  2. No evidence of bowel obstruction or acute bowel inflammation.  Moderate to large volume stool in the right and transverse colon,  which may indicate constipation.   Impression/Recommendation  ? 84 y.o. female with a history right incarcerated femoral hernia status post repair on 03/09/2018 with placement of mesh, diabetes mellitus is admitted to the hospital with swelling at the site of the previous hernia surgery was admitted on 05/14/2018 for surgery. Patient was in [place] hospital between 03/01/2018 and 03/16/2018 for incarcerated femoral hernia which initially was managed conservatively and then had to be taken for surgery by Dr. Lysle Pearl on 03/09/2018.  ?  ?  Right femoral hernia repair surgical site infection infected seroma.  Status post surgery with removal  of the mesh and wound VAC placement. Currently on vancomycin. And cefepime .  Need to evaluate the wound when the wound VAC is being changed. The duration, route, or type of antibiotic will depend on the culture report as well as the depth of the infection.   Recent incarcerated right femoral hernia status post repair on July 30   Diabetes mellitus _the meds in the hospital.   Hypothyroidism on Synthroid  __________________________________________________  Discussed with patient, and her daughter and requesting provider

## 2018-05-17 NOTE — Consult Note (Signed)
[facility] Nurse wound consult note Reason for Consult:Right groin abscess.  I & D of this site and VAC applied today.  [facility] team will follow and change M/W/F Wound type:infectious Pressure Injury POA: NA Measurement:Per MD note:  12 cm x 8 cm x 4 cm  Wound bed:not assessed today Drainage (amount, consistency, odor) blood in canister [place] team will follow.  Domenic Moras MSN, RN, FNP-BC CWON Wound, Ostomy, Continence Nurse Pager [phone]

## 2018-05-17 NOTE — Progress Notes (Signed)
Pharmacy Antibiotic Note  Tara [name] is a 84 y.o. female admitted on 05/14/2018 with an abdominal wall seroma.  Pharmacy has been consulted for vancomycin dosing. This is a 26-yo female with PMH that includes HTN, Her SCr is elevated over her baseline level of approximately 1 mg/dL  10/5 Pharmacy consulted for cefepime dosing. Patient has a reported allergy of swelling to PCN, but patient has tolerated cephalosporins in the past.    Plan: Vancomycin 500mg  IV every 48 hours after initial 1000mg  dose (stacked dosing)  Goal trough 15-20 mcg/mL. Vt ordered prior to the 3rd overall dose (not at steady state but per policy).  Pharmacy will monitor for changes in renal function that require dosing changes.  K: 0.014 Vd: 25L T1/2: 49h  Start Cefepime 1g IV every 24 hours.   Height: 4\' 10"  (147.3 cm) Weight: 78 lb 0.7 oz (35.4 kg) IBW/kg (Calculated) : 40.9  Allergies  Allergen Reactions  . Ace Inhibitors Other (See Comments)    Angioedema  . Hydrochlorothiazide W-Triamterene Other (See Comments)    Gout flare  . Lisinopril Swelling and Other (See Comments)  . Penicillins Swelling  . Trichlormethiazide Other (See Comments)    Gout flare    Antimicrobials this admission: Vancomycin 10/4 >>  Cefepime 10/5 >> Microbiology results: 10/4 MRSA PCR: negative  Thank you for allowing pharmacy to be a part of this patient's care.  Rocky Morel, PharmD, BCPS Clinical Pharmacist 05/17/2018 10:23 AM

## 2018-05-17 NOTE — Care Management Important Message (Signed)
Copy of signed IM left with patient in room.  

## 2018-05-18 LAB — CBC WITH DIFFERENTIAL/PLATELET
BASOS PCT: 0 %
Basophils Absolute: 0 10*3/uL (ref 0–0.1)
EOS ABS: 0.1 10*3/uL (ref 0–0.7)
Eosinophils Relative: 2 %
HCT: 27.3 % — ABNORMAL LOW (ref 35.0–47.0)
HEMOGLOBIN: 9 g/dL — AB (ref 12.0–16.0)
Lymphocytes Relative: 30 %
Lymphs Abs: 1.8 10*3/uL (ref 1.0–3.6)
MCH: 30.8 pg (ref 26.0–34.0)
MCHC: 33.2 g/dL (ref 32.0–36.0)
MCV: 93 fL (ref 80.0–100.0)
Monocytes Absolute: 0.7 10*3/uL (ref 0.2–0.9)
Monocytes Relative: 12 %
NEUTROS PCT: 56 %
Neutro Abs: 3.3 10*3/uL (ref 1.4–6.5)
Platelets: 391 10*3/uL (ref 150–440)
RBC: 2.93 MIL/uL — AB (ref 3.80–5.20)
RDW: 16.7 % — ABNORMAL HIGH (ref 11.5–14.5)
WBC: 5.9 10*3/uL (ref 3.6–11.0)

## 2018-05-18 LAB — BASIC METABOLIC PANEL
Anion gap: 6 (ref 5–15)
BUN: 43 mg/dL — ABNORMAL HIGH (ref 8–23)
CHLORIDE: 106 mmol/L (ref 98–111)
CO2: 27 mmol/L (ref 22–32)
Calcium: 8.5 mg/dL — ABNORMAL LOW (ref 8.9–10.3)
Creatinine, Ser: 1.54 mg/dL — ABNORMAL HIGH (ref 0.44–1.00)
GFR calc non Af Amer: 27 mL/min — ABNORMAL LOW (ref >60)
GFR, EST AFRICAN AMERICAN: 31 mL/min — AB (ref >60)
Glucose, Bld: 215 mg/dL — ABNORMAL HIGH (ref 70–99)
POTASSIUM: 4.3 mmol/L (ref 3.5–5.1)
Sodium: 139 mmol/L (ref 135–145)

## 2018-05-18 LAB — MAGNESIUM: MAGNESIUM: 2.1 mg/dL (ref 1.7–2.4)

## 2018-05-18 LAB — GLUCOSE, CAPILLARY
GLUCOSE-CAPILLARY: 201 mg/dL — AB (ref 70–99)
Glucose-Capillary: 308 mg/dL — ABNORMAL HIGH (ref 70–99)
Glucose-Capillary: 188 mg/dL — ABNORMAL HIGH (ref 70–99)

## 2018-05-18 LAB — PHOSPHORUS: PHOSPHORUS: 3.6 mg/dL (ref 2.5–4.6)

## 2018-05-18 LAB — VANCOMYCIN, TROUGH: VANCOMYCIN TR: 6 ug/mL — AB (ref 15–20)

## 2018-05-18 MED ORDER — INSULIN ASPART 100 UNIT/ML ~~LOC~~ SOLN
0.0000 [IU] | Freq: Three times a day (TID) | SUBCUTANEOUS | Status: DC
  Administered 2018-05-18: 7 [IU] via SUBCUTANEOUS
  Administered 2018-05-18: 3 [IU] via SUBCUTANEOUS
  Administered 2018-05-19: 5 [IU] via SUBCUTANEOUS
  Administered 2018-05-19: 2 [IU] via SUBCUTANEOUS
  Filled 2018-05-18 (×4): qty 1

## 2018-05-18 MED ORDER — VANCOMYCIN HCL 500 MG IV SOLR
500.0000 mg | INTRAVENOUS | Status: DC
  Administered 2018-05-18: 500 mg via INTRAVENOUS
  Filled 2018-05-18 (×2): qty 500

## 2018-05-18 MED ORDER — SODIUM CHLORIDE 0.9 % IV SOLN
INTRAVENOUS | Status: DC
  Administered 2018-05-18 – 2018-05-19 (×2): via INTRAVENOUS

## 2018-05-18 MED ORDER — HYDRALAZINE HCL 50 MG PO TABS
75.0000 mg | ORAL_TABLET | Freq: Three times a day (TID) | ORAL | Status: DC
  Administered 2018-05-18 – 2018-05-19 (×2): 75 mg via ORAL
  Filled 2018-05-18 (×2): qty 1

## 2018-05-18 NOTE — Consult Note (Addendum)
Reason for Consult: No chief complaint on file.  Referring Physician: DR.Sakai  Tara [name] is an 84 y.o. female.  HPI: 84 year old female with history of right incarcerated femoral hernia status post repair and mesh placement on 03/09/2018, diabetes metas, hypertension, seasonal allergies, hypothyroidism and other medical problems was taken care by surgery.  Hospitalist team is consulted for medical management.  During my examination patient is resting comfortably.  2 daughters at bedside.  Denies any complaints  Past Medical History:  Diagnosis Date  . Asthma   . Diabetes mellitus ([place])   . Environmental allergies   . Hypertension     Past Surgical History:  Procedure Laterality Date  . APPENDECTOMY    . APPLICATION OF WOUND VAC Right 05/14/2018   Procedure: APPLICATION OF WOUND VAC;  Surgeon: Benjamine Sprague, DO;  Location: [facility] ORS;  Service: General;  Laterality: Right;  . INCISION AND DRAINAGE ABSCESS Right 05/14/2018   Procedure: INCISION AND DRAINAGE ABSCESS RIGHT GROIN;  Surgeon: Benjamine Sprague, DO;  Location: [facility] ORS;  Service: General;  Laterality: Right;  . INGUINAL HERNIA REPAIR  03/09/2018   Procedure: HERNIA REPAIR INGUINAL INCARCERATED;  Surgeon: Benjamine Sprague, DO;  Location: [facility] ORS;  Service: General;;  . INSERTION OF MESH  03/09/2018   Procedure: INSERTION OF MESH;  Surgeon: Benjamine Sprague, DO;  Location: [facility] ORS;  Service: General;;  . LAPAROSCOPY N/A 03/09/2018   Procedure: LAPAROSCOPY DIAGNOSTIC;  Surgeon: Benjamine Sprague, DO;  Location: [facility] ORS;  Service: General;  Laterality: N/A;  . TUBAL LIGATION      Family History  Problem Relation Age of Onset  . Hypertension Mother   . Cancer Brother        lung (question)  . Cancer Sister        questionable type    Social History:  reports that she has never smoked. She has never used smokeless tobacco. She reports that she does not drink alcohol or use drugs.  Allergies:  Allergies  Allergen Reactions  . Ace  Inhibitors Other (See Comments)    Angioedema  . Hydrochlorothiazide W-Triamterene Other (See Comments)    Gout flare  . Lisinopril Swelling and Other (See Comments)  . Penicillins Swelling  . Trichlormethiazide Other (See Comments)    Gout flare    Medications: I have reviewed the patient's current medications.  Results for orders placed or performed during the hospital encounter of 05/14/18 (from the past 48 hour(s))  Phosphorus     Status: None   Collection Time: 05/17/18  4:44 AM  Result Value Ref Range   Phosphorus 3.6 2.5 - 4.6 mg/dL    Comment: Performed at [facility], [address] [place], [place] [address]  CBC with Differential/Platelet     Status: Abnormal   Collection Time: 05/17/18  4:44 AM  Result Value Ref Range   WBC 6.2 3.6 - 11.0 K/uL   RBC 3.17 (L) 3.80 - 5.20 MIL/uL   Hemoglobin 9.9 (L) 12.0 - 16.0 g/dL   HCT 29.3 (L) 35.0 - 47.0 %   MCV 92.4 80.0 - 100.0 fL   MCH 31.2 26.0 - 34.0 pg   MCHC 33.7 32.0 - 36.0 g/dL   RDW 16.4 (H) 11.5 - 14.5 %   Platelets 411 150 - 440 K/uL   Neutrophils Relative % 48 %   Neutro Abs 3.0 1.4 - 6.5 K/uL   Lymphocytes Relative 32 %   Lymphs Abs 2.0 1.0 - 3.6 K/uL   Monocytes Relative 14 %  Monocytes Absolute 0.9 0.2 - 0.9 K/uL   Eosinophils Relative 1 %   Eosinophils Absolute 0.1 0 - 0.7 K/uL   Basophils Relative 5 %   Basophils Absolute 0.3 (H) 0 - 0.1 K/uL    Comment: Performed at [facility], [address] [place], [place] [address]  Basic metabolic panel     Status: Abnormal   Collection Time: 05/17/18  4:44 AM  Result Value Ref Range   Sodium 141 135 - 145 mmol/L   Potassium 4.3 3.5 - 5.1 mmol/L   Chloride 106 98 - 111 mmol/L   CO2 26 22 - 32 mmol/L   Glucose, Bld 197 (H) 70 - 99 mg/dL   BUN 30 (H) 8 - 23 mg/dL   Creatinine, Ser 1.37 (H) 0.44 - 1.00 mg/dL   Calcium 8.6 (L) 8.9 - 10.3 mg/dL   GFR calc non Af Amer 31 (L) >60 mL/min   GFR calc Af Amer 36 (L) >60 mL/min    Comment:  (NOTE) The eGFR has been calculated using the CKD EPI equation. This calculation has not been validated in all clinical situations. eGFR's persistently <60 mL/min signify possible Chronic Kidney Disease.    Anion gap 9 5 - 15    Comment: Performed at [facility], [address] [place], [place] [address]  Magnesium     Status: None   Collection Time: 05/17/18  4:44 AM  Result Value Ref Range   Magnesium 2.0 1.7 - 2.4 mg/dL    Comment: Performed at [facility], [address] [place], [place] [address]  Phosphorus     Status: None   Collection Time: 05/18/18  4:19 AM  Result Value Ref Range   Phosphorus 3.6 2.5 - 4.6 mg/dL    Comment: Performed at [facility], [address] [place], [place] [address]  CBC with Differential/Platelet     Status: Abnormal   Collection Time: 05/18/18  4:19 AM  Result Value Ref Range   WBC 5.9 3.6 - 11.0 K/uL   RBC 2.93 (L) 3.80 - 5.20 MIL/uL   Hemoglobin 9.0 (L) 12.0 - 16.0 g/dL   HCT 27.3 (L) 35.0 - 47.0 %   MCV 93.0 80.0 - 100.0 fL   MCH 30.8 26.0 - 34.0 pg   MCHC 33.2 32.0 - 36.0 g/dL   RDW 16.7 (H) 11.5 - 14.5 %   Platelets 391 150 - 440 K/uL   Neutrophils Relative % 56 %   Neutro Abs 3.3 1.4 - 6.5 K/uL   Lymphocytes Relative 30 %   Lymphs Abs 1.8 1.0 - 3.6 K/uL   Monocytes Relative 12 %   Monocytes Absolute 0.7 0.2 - 0.9 K/uL   Eosinophils Relative 2 %   Eosinophils Absolute 0.1 0 - 0.7 K/uL   Basophils Relative 0 %   Basophils Absolute 0.0 0 - 0.1 K/uL    Comment: Performed at [facility], [address] [place], [place] [address]  Basic metabolic panel     Status: Abnormal   Collection Time: 05/18/18  4:19 AM  Result Value Ref Range   Sodium 139 135 - 145 mmol/L   Potassium 4.3 3.5 - 5.1 mmol/L   Chloride 106 98 - 111 mmol/L   CO2 27 22 - 32 mmol/L   Glucose, Bld 215 (H) 70 - 99 mg/dL   BUN 43 (H) 8 - 23 mg/dL   Creatinine, Ser 1.54 (H) 0.44 - 1.00 mg/dL   Calcium 8.5 (L) 8.9 -  10.3  mg/dL   GFR calc non Af Amer 27 (L) >60 mL/min   GFR calc Af Amer 31 (L) >60 mL/min    Comment: (NOTE) The eGFR has been calculated using the CKD EPI equation. This calculation has not been validated in all clinical situations. eGFR's persistently <60 mL/min signify possible Chronic Kidney Disease.    Anion gap 6 5 - 15    Comment: Performed at [facility], [address] [place], [place] [address]  Magnesium     Status: None   Collection Time: 05/18/18  4:19 AM  Result Value Ref Range   Magnesium 2.1 1.7 - 2.4 mg/dL    Comment: Performed at [facility], [address] [place], [place] [address]  Glucose, capillary     Status: Abnormal   Collection Time: 05/18/18 11:30 AM  Result Value Ref Range   Glucose-Capillary 308 (H) 70 - 99 mg/dL   Comment 1 Notify RN   Vancomycin, trough     Status: Abnormal   Collection Time: 05/18/18  2:54 PM  Result Value Ref Range   Vancomycin Tr 6 (L) 15 - 20 ug/mL    Comment: Performed at [facility], [address] [place],  [address]  Glucose, capillary     Status: Abnormal   Collection Time: 05/18/18  4:29 PM  Result Value Ref Range   Glucose-Capillary 201 (H) 70 - 99 mg/dL   Comment 1 Notify RN     No results found.  ROS:  CONSTITUTIONAL: Denies fevers, chills. Denies any fatigue, weakness.  EYES: Denies blurry vision, double vision, eye pain. EARS, NOSE, THROAT: Denies tinnitus, ear pain, hearing loss. RESPIRATORY: Denies cough, wheeze, shortness of breath.  CARDIOVASCULAR: Denies chest pain, palpitations, edema.  GASTROINTESTINAL: Denies nausea, vomiting, diarrhea, abdominal pain. Denies bright red blood per rectum. GENITOURINARY: Denies dysuria, hematuria. ENDOCRINE: Denies nocturia or thyroid problems. HEMATOLOGIC AND LYMPHATIC: Denies easy bruising or bleeding. SKIN: Denies rash or lesion. MUSCULOSKELETAL: Denies pain in neck, back, shoulder, knees, hips or arthritic symptoms.   NEUROLOGIC: Denies paralysis, paresthesias.  PSYCHIATRIC: Denies anxiety or depressive symptoms. Blood pressure (!) 151/50, pulse 71, temperature 98.2 F (36.8 C), temperature source Oral, resp. rate 18, height _0  (1.473 m), weight 35.4 kg, SpO2 97 %.   PHYSICAL EXAMINATION:  GENERAL:   currently in no acute distress.  HEAD: Normocephalic, atraumatic.  EYES: Pupils equal, round, and reactive to light. Extraocular muscles intact. No scleral icterus.  MOUTH: Moist mucosal membranes. Dentition intact. No abscess noted. EARS, NOSE, THROAT: Clear without exudates. No external lesions.  NECK: Supple. No thyromegaly. No nodules. No JVD.  PULMONARY: Clear to auscultation bilaterally without wheezes, rales, or rhonchi. No use of accessory muscles. Good respiratory effort. CHEST: Nontender to palpation.  CARDIOVASCULAR: S1, S2, regular rate and rhythm. No murmurs, rubs, or gallops.  GASTROINTESTINAL: Soft, nontender, nondistended. No masses. Positive bowel sounds. No hepatosplenomegaly. MUSCULOSKELETAL: No swelling, clubbing, edema. Range of motion full in all extremities. NEUROLOGIC: Cranial nerves II-XII intact. No gross focal neurological deficits. Sensation intact. Reflexes intact. SKIN: Right inguinal area with wound VAC . Skin warm, dry.  PSYCHIATRIC: Mood, affect within normal limits. Patient awake, alert, oriented x 3. Insight and judgment intact.   Assessment/Plan:   AKI-from poor p.o. intake Baseline creatinine is normal beginning of this month Hydrate with IVF Avoid nephrotoxins and renal dose other medications   Diabetes mellitus sliding scale insulin  Hypothyroidism continue Synthyroid  Essential hypertension-blood pressure is elevated Hydralazine dose increased to  75 mg every 8 hours, continue metoprolol, clonidine and amlodipine and titrate as needed  Right femoral hernia repair, surgical site with infected seroma Status post surgery, removal of the mesh and wound  VAC placement Management by surgery   Thank you for consulting internal medicine for medical management    TOTAL TIME TAKING CARE OF THIS PATIENT: 41 minutes.   Note: This dictation was prepared with Dragon dictation along with smaller phrase technology. Any transcriptional errors that result from this process are unintentional.   _0 @ Pager - [phone] 05/18/2018, 7:42 PM

## 2018-05-18 NOTE — Progress Notes (Signed)
Family Meeting Note  [place] Directive:yes  Today a meeting took place with the Patient, 2 daughters and granddaughter at bedside     The following clinical team members were present during this meeting:MD  The following were discussed:Patient's diagnosis: Acute kidney injury, hypertension, diabetes mellitus, hypothyroidism, infected seroma at the surgical site, other comorbidities seasonal allergies, treatment plan of care discussed in detail with the patient and family members at bedside.  They all verbalized understanding of the plan.    Patient's progosis: Unable to determine and Goals for treatment: DNR 2 daughters are healthcare power of attorney  Additional follow-up to be provided: General surgery, infectious disease and hospitalist  Time spent during discussion:57min  Nicholes Mango, MD

## 2018-05-18 NOTE — Care Management (Signed)
No updated MD note at this time.  Per nursing plan is to change wound vac tomorrow at bedside with the surgeon.  Patient will be going home with a Medela vac at discharge, which is not compatible with the tubing to the hospital vac.  RNCM will have to bee notified when the vac needs to be delivered to room.  Vac can only be delivered within 48 hours prior to discharge.

## 2018-05-18 NOTE — Progress Notes (Signed)
Pharmacy Antibiotic Note  Tara [name] is a 84 y.o. female admitted on 05/14/2018 with an abdominal wall seroma.  Pharmacy was originally consulted for vancomycin dosing. This is a 64-yo female with PMH that includes HTN, asthma and DM. On 10/5 cefepime was added by ID for gram-negative coverage. During admission a wound vac has been placed. This is day number 5 of IV antibiotics. Her Scr has not improved during her admission.   Vancomycin was initiated at 500mg  IV every 48 hours after initial 1000mg  dose (stacked dosing)  Vt ordered prior to the 3rd overall dose (not at steady state but per policy). Vr: 6 mcg/mL  Plan:  Based on this early level vancomycin dose is being changed to 500mg  IV every 36 hours based on the following calculations derived from this level:  K: 0.023 Vd: 25L T1/2: 30h  Vt ordered prior to 4th dose. Pharmacy will monitor for changes in renal function that require dosing changes.  Cefepime was started at a dose of 1 gram IV every 24 hours. That dose is being continued.  Height: 4\' 10"  (147.3 cm) Weight: 78 lb 0.7 oz (35.4 kg) IBW/kg (Calculated) : 40.9  Allergies  Allergen Reactions  . Ace Inhibitors Other (See Comments)    Angioedema  . Hydrochlorothiazide W-Triamterene Other (See Comments)    Gout flare  . Lisinopril Swelling and Other (See Comments)  . Penicillins Swelling  . Trichlormethiazide Other (See Comments)    Gout flare    Antimicrobials this admission: Vancomycin 10/4 >>  Cefepime 10/5 >>  Microbiology results: 10/4 MRSA PCR: negative 10/4: WCx: S epidermidis sensitive to rifampin, tetracycline and vancomycin  Thank you for allowing pharmacy to be a part of this patient's care.  Dallie Piles, PharmD Clinical Pharmacist 05/18/2018 3:00 PM

## 2018-05-19 ENCOUNTER — Telehealth: Payer: Self-pay | Admitting: *Deleted

## 2018-05-19 LAB — CBC WITH DIFFERENTIAL/PLATELET
Abs Immature Granulocytes: 0.05 10*3/uL (ref 0.00–0.07)
BASOS ABS: 0 10*3/uL (ref 0.0–0.1)
BASOS PCT: 0 %
EOS ABS: 0.1 10*3/uL (ref 0.0–0.5)
Eosinophils Relative: 2 %
HCT: 26.7 % — ABNORMAL LOW (ref 36.0–46.0)
Hemoglobin: 8.8 g/dL — ABNORMAL LOW (ref 12.0–15.0)
IMMATURE GRANULOCYTES: 1 %
Lymphocytes Relative: 34 %
Lymphs Abs: 2.4 10*3/uL (ref 0.7–4.0)
MCH: 29.9 pg (ref 26.0–34.0)
MCHC: 33 g/dL (ref 30.0–36.0)
MCV: 90.8 fL (ref 80.0–100.0)
Monocytes Absolute: 0.6 10*3/uL (ref 0.1–1.0)
Monocytes Relative: 9 %
NEUTROS PCT: 54 %
NRBC: 0 % (ref 0.0–0.2)
Neutro Abs: 3.7 10*3/uL (ref 1.7–7.7)
PLATELETS: 407 10*3/uL — AB (ref 150–400)
RBC: 2.94 MIL/uL — AB (ref 3.87–5.11)
RDW: 17.1 % — AB (ref 11.5–15.5)
WBC: 6.9 10*3/uL (ref 4.0–10.5)

## 2018-05-19 LAB — BASIC METABOLIC PANEL
ANION GAP: 9 (ref 5–15)
BUN: 41 mg/dL — ABNORMAL HIGH (ref 8–23)
CO2: 24 mmol/L (ref 22–32)
Calcium: 8.2 mg/dL — ABNORMAL LOW (ref 8.9–10.3)
Chloride: 108 mmol/L (ref 98–111)
Creatinine, Ser: 1.55 mg/dL — ABNORMAL HIGH (ref 0.44–1.00)
GFR calc Af Amer: 31 mL/min — ABNORMAL LOW (ref >60)
GFR, EST NON AFRICAN AMERICAN: 27 mL/min — AB (ref >60)
Glucose, Bld: 185 mg/dL — ABNORMAL HIGH (ref 70–99)
POTASSIUM: 4.2 mmol/L (ref 3.5–5.1)
Sodium: 141 mmol/L (ref 135–145)

## 2018-05-19 LAB — GLUCOSE, CAPILLARY
GLUCOSE-CAPILLARY: 159 mg/dL — AB (ref 70–99)
GLUCOSE-CAPILLARY: 261 mg/dL — AB (ref 70–99)

## 2018-05-19 LAB — PHOSPHORUS: PHOSPHORUS: 3.3 mg/dL (ref 2.5–4.6)

## 2018-05-19 LAB — MAGNESIUM: MAGNESIUM: 2 mg/dL (ref 1.7–2.4)

## 2018-05-19 LAB — HEMOGLOBIN A1C
Hgb A1c MFr Bld: 7.3 % — ABNORMAL HIGH (ref 4.8–5.6)
MEAN PLASMA GLUCOSE: 162.81 mg/dL

## 2018-05-19 MED ORDER — DOXYCYCLINE HYCLATE 50 MG PO CAPS: 100.0000 mg | ORAL_CAPSULE | Freq: Two times a day (BID) | ORAL | 0 refills | Status: AC

## 2018-05-19 MED ORDER — HYDROCODONE-ACETAMINOPHEN 5-325 MG PO TABS: 1.0000 | ORAL_TABLET | Freq: Four times a day (QID) | ORAL | 0 refills | Status: AC | PRN

## 2018-05-19 MED ORDER — SYNTHROID 50 MCG PO TABS: 50.0000 ug | ORAL_TABLET | Freq: Every day | ORAL | 0 refills | Status: DC

## 2018-05-19 MED ORDER — OCUVITE-LUTEIN PO CAPS: 1.0000 | ORAL_CAPSULE | Freq: Every day | ORAL | 0 refills | Status: AC

## 2018-05-19 MED ORDER — HYDRALAZINE HCL 25 MG PO TABS: 75.0000 mg | ORAL_TABLET | Freq: Three times a day (TID) | ORAL | 0 refills | Status: DC

## 2018-05-19 NOTE — Care Management Important Message (Signed)
Copy of signed IM left with patient in room.  

## 2018-05-19 NOTE — Care Management Note (Signed)
Case Management Note  Patient Details  Name: Tara [name] MRN: [id number] Date of Birth: 07-07-1918   Patient to discharge home today.  Home Vac was delivered to room and placed on patient.  Daughter at bedside for transportation.  Brad with [facility] notified of discharge   Subjective/Objective:                    Action/Plan:   Expected Discharge Date:  05/19/18               Expected Discharge Plan:  [place]  In-House Referral:     Discharge planning Services  CM Consult  Post Acute Care Choice:  Durable Medical Equipment, Home Health Choice offered to:  Adult Children  DME Arranged:  Vac DME Agency:  [facility] Arranged:  RN, PT, Nurse's Aide [place] Agency:  [facility]  Status of Service:  Completed, signed off  If discussed at [place] of Stay Meetings, dates discussed:    Additional Comments:  Beverly Sessions, RN 05/19/2018, 3:30 PM

## 2018-05-19 NOTE — Progress Notes (Signed)
[place] [facility] [place] NAME: Mily Malecki    MR#:  [id number]  DATE OF BIRTH:  [birth year]  SUBJECTIVE:  Patient seen and evaluated today Tolerating diet well No complaints of any shortness of breath No complaints of any chest pain  REVIEW OF SYSTEMS:    ROS  CONSTITUTIONAL: No documented fever. No fatigue, weakness. No weight gain, no weight loss.  EYES: No blurry or double vision.  ENT: No tinnitus. No postnasal drip. No redness of the oropharynx.  RESPIRATORY: No cough, no wheeze, no hemoptysis. No dyspnea.  CARDIOVASCULAR: No chest pain. No orthopnea. No palpitations. No syncope.  GASTROINTESTINAL: No nausea, no vomiting or diarrhea. No abdominal pain. No melena or hematochezia.  GENITOURINARY: No dysuria or hematuria.  ENDOCRINE: No polyuria or nocturia. No heat or cold intolerance.  HEMATOLOGY: No anemia. No bruising. No bleeding.  INTEGUMENTARY: No rashes. No lesions.  MUSCULOSKELETAL: No arthritis. No swelling. No gout.  NEUROLOGIC: No numbness, tingling, or ataxia. No seizure-type activity.  PSYCHIATRIC: No anxiety. No insomnia. No ADD.   DRUG ALLERGIES:   Allergies  Allergen Reactions  . Ace Inhibitors Other (See Comments)    Angioedema  . Hydrochlorothiazide W-Triamterene Other (See Comments)    Gout flare  . Lisinopril Swelling and Other (See Comments)  . Penicillins Swelling  . Trichlormethiazide Other (See Comments)    Gout flare    VITALS:  Blood pressure (!) 197/85, pulse 72, temperature 98.2 F (36.8 C), temperature source Oral, resp. rate 18, height 4\' 10"  (1.473 m), weight 35.4 kg, SpO2 100 %.  PHYSICAL EXAMINATION:   Physical Exam  GENERAL:  84 y.o.-year-old patient lying in the bed with no acute distress.  EYES: Pupils equal, round, reactive to light and accommodation. No scleral icterus. Extraocular muscles intact.  HEENT: Head atraumatic, normocephalic. Oropharynx and nasopharynx clear.  NECK:  Supple,  no jugular venous distention. No thyroid enlargement, no tenderness.  LUNGS: Normal breath sounds bilaterally, no wheezing, rales, rhonchi. No use of accessory muscles of respiration.  CARDIOVASCULAR: S1, S2 normal. No murmurs, rubs, or gallops.  ABDOMEN: Soft, nontender, nondistended. Bowel sounds present. No organomegaly or mass.  Lower abdominal bandage present with wound VAC EXTREMITIES: No cyanosis, clubbing or edema b/l.    NEUROLOGIC: Cranial nerves II through XII are intact. No focal Motor or sensory deficits b/l.   PSYCHIATRIC: The patient is alert and oriented x 3.  SKIN: Right inguinal area bandaged with wound VAC  LABORATORY PANEL:   CBC Recent Labs  Lab 05/19/18 0526  WBC 6.9  HGB 8.8*  HCT 26.7*  PLT 407*   ------------------------------------------------------------------------------------------------------------------ Chemistries  Recent Labs  Lab 05/19/18 0526  NA 141  K 4.2  CL 108  CO2 24  GLUCOSE 185*  BUN 41*  CREATININE 1.55*  CALCIUM 8.2*  MG 2.0   ------------------------------------------------------------------------------------------------------------------  Cardiac Enzymes No results for input(s): TROPONINI in the last 168 hours. ------------------------------------------------------------------------------------------------------------------  RADIOLOGY:  No results found.   ASSESSMENT AND PLAN:   84 year old elderly female patient with history of hypothyroidism, type 2 diabetes mellitus, hypertension currently under surgical service for femoral hernia repair  -Acute kidney injury Improved with IV fluids Avoid nephrotoxic medication Monitor creatinine  -Hypertension Continue metoprolol, clonidine and Norvasc Hydralazine dose has been increased for better control of blood pressure  -Right femoral hernia status post repair Surgical site with infected seroma Status post surgery removal of the mesh and wound VAC placement Surgery  follow-up  -Hypothyroidism Continue oral Synthroid  All the records are reviewed and case discussed with Care Management/Social Worker. Management plans discussed with the patient, family and they are in agreement.  CODE STATUS: DNR  DVT Prophylaxis: SCDs  TOTAL TIME TAKING CARE OF THIS PATIENT: 31 minutes.   POSSIBLE D/C IN 2 to 3 DAYS, DEPENDING ON CLINICAL CONDITION.  Saundra Shelling M.D on 05/19/2018 at 11:45 AM  Between 7am to 6pm - Pager - [phone]  After 6pm go to [email or web address] - password [email or web address]  Office  [phone]  CC: Primary care physician; Einar Pheasant, MD  Note: This dictation was prepared with Dragon dictation along with smaller phrase technology. Any transcriptional errors that result from this process are unintentional.

## 2018-05-19 NOTE — Consult Note (Signed)
[facility] Nurse wound consult note Reason for Consult: Consult requested to change Vac dressing. ID physician at the bedside to assess wound appearance during dressing change.   Wound type: Right groin with full thickness post-op wound Measurement: 2X6X2.5cm Wound bed: beefy red and moist Drainage (amount, consistency, odor) mod amt dark reddish brown drainage in the cannister Periwound: Intact skin surrounding Dressing procedure/placement/frequency: Pt medicated for pain prior to the procedure and tolerated with minimal amt discomfort. Applied one piece black foam to 162mm cont suction and attached to [place] pump for discharge home later today. Julien Girt MSN, RN, [place], [place], [place]

## 2018-05-19 NOTE — Discharge Instructions (Signed)
-   tylenol as needed for discomfort.   - Use narcotics,  only when tylenol is not enough to control pain. - 325-650mg  every 8hrs to max of 4000mg /24hrs (including the 325mg  in every norco dose) for the tylenol.

## 2018-05-19 NOTE — Telephone Encounter (Signed)
Copied from [place] [phone]. Topic: Quick Communication - See Telephone Encounter >> May 19, 2018  1:37 PM Hewitt Shorts wrote: Pt is being discharged today from the hospital and they would like a appt with in two days for Dr, Lars Mage schedule but nothing is  Available please call once worked in at

## 2018-05-19 NOTE — Progress Notes (Signed)
Nutrition Follow Up Note   DOCUMENTATION CODES:   Severe malnutrition in context of chronic illness, Underweight  INTERVENTION:   Provide Ensure Enlive po BID, each supplement provides 350 kcal and 20 grams of protein.  Provide Ocuvite daily for wound healing (provides zinc, vitamin A, vitamin C, Vitamin E, copper, and selenium).  Liberal diet   NUTRITION DIAGNOSIS:   Severe Malnutrition related to chronic illness(advanced age, recent LOA and open femoral hernia repair 7/30, wound infection, decreased intake since surgery) as evidenced by severe fat depletion, severe muscle depletion, per patient/family report.  GOAL:   Patient will meet greater than or equal to 90% of their needs  -progressing   MONITOR:   PO intake, Supplement acceptance, Labs, Weight trends, Skin, I & O's  ASSESSMENT:   84 year old female with PMHx of asthma, DM, HTN, recent diagnostic laparoscopy, lysis of adhesions, and open femoral hernia repair with mesh on 03/09/2018 for intraabdominal adhesions and incarcerated right femoral hernia who is now admitted with right groin wound infection s/p incision and drainage of right groin abscess and application of wound VAC on 05/14/2018.   Pt s/p I & D with VAC placement 10/7  Pt with fair appetite and oral intake; pt eating ~40-50% of meals and drinking some Ensure. No new weight since admit; recommend obtain new weight. Wound VAC in place. Recommend continue vitamins and supplements after discharge.   Medications reviewed and include: pepcid, heparin, insulin, synthroid, miralax, senokot, torsemide   Labs reviewed: K 4.2 wnl, Mg 2.0 wnl, P 3.3 wnl Hgb 8.8(L), Hct 26.7(L)  Diet Order:   Diet Order            Diet regular Room service appropriate? Yes; Fluid consistency: Thin  Diet effective now             EDUCATION NEEDS:   Education needs have been addressed  Skin:  Skin Assessment: Skin Integrity Issues:(closed incision to right groin; no  measurements for wound at this time)  Last BM:  10/9- type 4  Height:   Ht Readings from Last 1 Encounters:  05/14/18 4\' 10"  (1.473 m)    Weight:   Wt Readings from Last 1 Encounters:  05/14/18 35.4 kg    Ideal Body Weight:  43.9 kg  BMI:  Body mass index is 16.31 kg/m.  Estimated Nutritional Needs:   Kcal:  3016-0109  Protein:  55-65 grams  Fluid:  1.2-1.4 L/day  Koleen Distance MS, RD, LDN Pager #- [phone] Office#- [phone] After Hours Pager: [phone]

## 2018-05-19 NOTE — Discharge Summary (Signed)
Physician Discharge Summary  Patient ID: Tara [name] MRN: [id number] DOB/AGE: February 05, 1918 84 y.o.  Admit date: 05/14/2018 Discharge date: 05/19/2018  Admission Diagnoses: Infected femoral hernia mesh  Discharge Diagnoses:  Same as above, elevated creatinine level  Discharged Condition: good  Hospital Course: 84 year old female with a previous history of right femoral hernia repair with plug mesh.  The developed a postoperative seroma that did not resolve after 2 months.  In office drainage of the seroma was attempted and successful, however patient developed a wound infection at the site shortly after.  Initial incision and drainage in the office noted the mesh that was visible within the infected cavity, therefore patient was admitted for formal debridement in the operating room and then IV antibiotic care afterwards.  Wound VAC was placed after debridement was successful.  ID consulted for antibiotic management as well as the hospitalist for a persistently elevated creatinine level.  Wound care consult also placed for wound VAC management during her inpatient stay.  Patient recovered from the surgery well with no further signs of spreading infection.  At the time of discharge wound looked adequately debrided, cultures came back sensitive to doxycycline.  Patient tolerating diet voiding appropriately and pain control with oral meds.  Hospitalist determined that follow-up on her creatinine level can be done as an outpatient basis with PCP.  She was also noted to have slightly elevated high blood pressure for which medication adjustments were made.  She will be sent home with the new blood pressure medication regimen as well as a 10-day course of doxycycline for her wound and follow-up with home health for routine wound VAC changes until the wound is healed.  She will follow-up with me on a weekly basis for wound checks in the meantime.  Consults: ID and hospitalist, wound care  Discharge  Exam: Blood pressure (!) 197/85, pulse 72, temperature 98.2 F (36.8 C), temperature source Oral, resp. rate 18, height 4\' 10"  (1.473 m), weight 35.4 kg, SpO2 100 %. General appearance: alert, cooperative and no distress GI: soft, non-tender; bowel sounds normal; no masses,  no organomegaly Skin: right groin wound vac in place  Disposition:    Discharge Instructions    Ambulatory referral to [place]   Complete by:  As directed    Please evaluate Tara [name] for admission to [facility].  Disciplines requested: Nursing  Services to provide: Wound/Ostomy Care and Other: right groin wound vac requiring MWF change at home  Physician to follow patient's care (the person listed here will be responsible for signing ongoing orders): Referring Provider  Requested Start of Care Date: Tomorrow  I certify that this patient is under my care and that I, or a Nurse Practitioner or Physician's Assistant working with me, had a face-to-face encounter that meets the physician face-to-face requirements with patient on 05/19/18. The encounter with the patient was in whole, or in part for the following medical condition(s) which is the primary reason for home health care (List medical condition). Infected femoral hernia mesh  Special Instructions:  right groin wound vac requiring MWF change at home   Does the patient have Medicare or Medicaid?:  Yes   The encounter with the patient was in whole, or in part, for the following medical condition, which is the primary reason for home health care:  femoral hernia mesh infection   Reason for Medically Necessary Home Health Services:  Skilled Nursing- Post-Surgical Wound Assessment and Care   My clinical findings support the need for  the above services:  OTHER SEE COMMENTS   I certify that, based on my findings, the following services are medically necessary home health services:  Nursing   Further, I certify that my clinical findings support that this  patient is homebound due to:  Unable to leave home safely without assistance     Allergies as of 05/19/2018      Reactions   Ace Inhibitors Other (See Comments)   Angioedema   Hydrochlorothiazide W-triamterene Other (See Comments)   Gout flare   Lisinopril Swelling, Other (See Comments)   Penicillins Swelling   Trichlormethiazide Other (See Comments)   Gout flare      Medication List    STOP taking these medications   cloNIDine 0.2 mg/24hr patch Commonly known as:  CATAPRES - Dosed in mg/24 hr   metoprolol tartrate 25 MG tablet Commonly known as:  LOPRESSOR   sulfamethoxazole-trimethoprim 800-160 MG tablet Commonly known as:  BACTRIM DS,SEPTRA DS   torsemide 10 MG tablet Commonly known as:  DEMADEX   UNABLE TO FIND     TAKE these medications   acetaminophen 325 MG tablet Commonly known as:  TYLENOL Take 650 mg by mouth every 6 (six) hours as needed for mild pain.   ASPIRIN 81 81 MG EC tablet Generic drug:  aspirin Take 81 mg by mouth daily. Swallow whole.   CALTRATE 600+D PO Take 1 tablet by mouth daily.   diclofenac sodium 1 % Gel Commonly known as:  VOLTAREN Apply 4 g topically 3 (three) times daily as needed. Apply to the Right Knee for effusion   doxycycline 50 MG capsule Commonly known as:  VIBRAMYCIN Take 2 capsules (100 mg total) by mouth 2 (two) times daily for 10 days.   famotidine 10 MG tablet Commonly known as:  PEPCID Take 10 mg by mouth 2 (two) times daily.   feeding supplement (ENSURE ENLIVE) Liqd Take 237 mLs by mouth 2 (two) times daily between meals.   feeding supplement (PRO-STAT SUGAR FREE 64) Liqd Take 30 mLs by mouth 2 (two) times daily between meals.   fexofenadine 180 MG tablet Commonly known as:  ALLEGRA Take 1 tablet (180 mg total) by mouth daily.   glucose blood test strip CHECK SUGAR ONCE DAILY   hydrALAZINE 25 MG tablet Commonly known as:  APRESOLINE Take 3 tablets (75 mg total) by mouth 3 (three) times daily. What  changed:    medication strength  how much to take   HYDROcodone-acetaminophen 5-325 MG tablet Commonly known as:  NORCO/VICODIN Take 1 tablet by mouth every 6 (six) hours as needed for up to 3 days for moderate pain.   metoprolol succinate 25 MG 24 hr tablet Commonly known as:  TOPROL-XL Take 25 mg by mouth 2 (two) times daily.   multivitamin-lutein Caps capsule Take 1 capsule by mouth daily. Start taking on:  05/20/2018   polyethylene glycol packet Commonly known as:  MIRALAX / GLYCOLAX Take 17 g by mouth daily.   potassium chloride 10 MEQ tablet Commonly known as:  K-DUR Take 10 mEq by mouth daily.   senna-docusate 8.6-50 MG tablet Commonly known as:  Senokot-S Take 2 tablets by mouth 2 (two) times daily.   SYNTHROID 50 MCG tablet Generic drug:  levothyroxine Take 1 tablet (50 mcg total) by mouth daily. What changed:  See the new instructions.      Follow-up Information    [place], Malikai Gut, DO. Schedule an appointment as soon as possible for a visit in 1 week(s).  Specialty:  Surgery Contact information: [address] [place] [address] [phone]        Einar Pheasant, MD. Schedule an appointment as soon as possible for a visit in 2 day(s).   Specialty:  Internal Medicine Why:  for follow up on elevated Creatinine levels and change in blood pressure medications Contact information: [address] [place] [address] [phone]            Total time spent arranging discharge was >27min. Signed: Benjamine Sprague 05/19/2018, 1:24 PM

## 2018-05-19 NOTE — Progress Notes (Signed)
Discharge order received. Patient is alert and oriented. Vital signs stable . No signs of acute distress. Discharge instructions given. Patient verbalized understanding. No other issues noted at this time.   

## 2018-05-19 NOTE — Progress Notes (Signed)
ID Pt doing well No specific complaints Rt wound examined with wound care consultant  Objective Blood pressure (!) 197/85, pulse 72, temperature 98.2 F (36.8 C), temperature source Oral, resp. rate 18, height 4\' 10"  (1.473 m), weight 35.4 kg, SpO2 100 %. Chest CTA HS s1s2 Abd soft Rt inguinal   surgical wound is clean with red base  Pertinent Labs  CBC Latest Ref Rng & Units 05/19/2018 05/18/2018 05/17/2018  WBC 4.0 - 10.5 K/uL 6.9 5.9 6.2  Hemoglobin 12.0 - 15.0 g/dL 8.8(L) 9.0(L) 9.9(L)  Hematocrit 36.0 - 46.0 % 26.7(L) 27.3(L) 29.3(L)  Platelets 150 - 400 K/uL 407(H) 391 411   CMP Latest Ref Rng & Units 05/19/2018 05/18/2018 05/17/2018  Glucose 70 - 99 mg/dL 185(H) 215(H) 197(H)  BUN 8 - 23 mg/dL 41(H) 43(H) 30(H)  Creatinine 0.44 - 1.00 mg/dL 1.55(H) 1.54(H) 1.37(H)  Sodium 135 - 145 mmol/L 141 139 141  Potassium 3.5 - 5.1 mmol/L 4.2 4.3 4.3  Chloride 98 - 111 mmol/L 108 106 106  CO2 22 - 32 mmol/L 24 27 26   Calcium 8.9 - 10.3 mg/dL 8.2(L) 8.5(L) 8.6(L)  Total Protein 6.5 - 8.1 g/dL - - -  Total Bilirubin 0.3 - 1.2 mg/dL - - -  Alkaline Phos 38 - 126 U/L - - -  AST 15 - 41 U/L - - -  ALT 0 - 44 U/L - - -   Culture from the mesh- staph epi  IMAGING RESULTS:  Personally reviewed CT pelvis and abdomen done on 05/03/2018  ?No evidence of a recurrent inguinal hernia. Stable large  postoperative fluid collection in the right inguinal region  compatible with seroma.  2. No evidence of bowel obstruction or acute bowel inflammation.  Moderate to large volume stool in the right and transverse colon,  which may indicate constipation.   Impression/Recommendation  ? 84 y.o. female with a history right incarcerated femoral hernia status post repair on 03/09/2018 with placement of mesh, diabetes mellitus is admitted to the hospital with swelling at the site of the previous hernia surgery was admitted on 05/14/2018 for surgery. Patient was in [facility] between 03/01/2018 and  03/16/2018 for incarcerated femoral hernia which initially was managed conservatively and then had to be taken for surgery by Dr. Lysle Pearl on 03/09/2018.  ?  ?  Right femoral hernia repair surgical site infection infected seroma.  Status post surgery with removal of the mesh and wound VAC placement. Staph epidermidis in the culture  Currently on vancomycin. And cefepime .  Wound is not deep and healing well- can switch IV to PO doxycycline 100mg  BID for 10 days. Also getting wound vac  Recent incarcerated right femoral hernia status post repair on July 30   Diabetes mellitus   Hypothyroidism on Synthroid  __________________________________________________  Discussed with patient, and her daughter and Dr.Sakai. Follow up with Dr.Sakai

## 2018-05-20 DIAGNOSIS — E1122 Type 2 diabetes mellitus with diabetic chronic kidney disease: Secondary | ICD-10-CM | POA: Diagnosis not present

## 2018-05-20 DIAGNOSIS — I120 Hypertensive chronic kidney disease with stage 5 chronic kidney disease or end stage renal disease: Secondary | ICD-10-CM | POA: Diagnosis not present

## 2018-05-20 DIAGNOSIS — Z7982 Long term (current) use of aspirin: Secondary | ICD-10-CM | POA: Diagnosis not present

## 2018-05-20 DIAGNOSIS — Z9181 History of falling: Secondary | ICD-10-CM | POA: Diagnosis not present

## 2018-05-20 DIAGNOSIS — J45909 Unspecified asthma, uncomplicated: Secondary | ICD-10-CM | POA: Diagnosis not present

## 2018-05-20 DIAGNOSIS — N185 Chronic kidney disease, stage 5: Secondary | ICD-10-CM | POA: Diagnosis not present

## 2018-05-20 DIAGNOSIS — E1151 Type 2 diabetes mellitus with diabetic peripheral angiopathy without gangrene: Secondary | ICD-10-CM | POA: Diagnosis not present

## 2018-05-20 DIAGNOSIS — M810 Age-related osteoporosis without current pathological fracture: Secondary | ICD-10-CM | POA: Diagnosis not present

## 2018-05-20 DIAGNOSIS — R1312 Dysphagia, oropharyngeal phase: Secondary | ICD-10-CM | POA: Diagnosis not present

## 2018-05-20 DIAGNOSIS — E46 Unspecified protein-calorie malnutrition: Secondary | ICD-10-CM | POA: Diagnosis not present

## 2018-05-20 DIAGNOSIS — K91873 Postprocedural seroma of a digestive system organ or structure following other procedure: Secondary | ICD-10-CM | POA: Diagnosis not present

## 2018-05-20 DIAGNOSIS — T8142XA Infection following a procedure, deep incisional surgical site, initial encounter: Secondary | ICD-10-CM | POA: Diagnosis not present

## 2018-05-20 DIAGNOSIS — Z48815 Encounter for surgical aftercare following surgery on the digestive system: Secondary | ICD-10-CM | POA: Diagnosis not present

## 2018-05-20 DIAGNOSIS — T8189XA Other complications of procedures, not elsewhere classified, initial encounter: Secondary | ICD-10-CM | POA: Diagnosis not present

## 2018-05-20 DIAGNOSIS — E039 Hypothyroidism, unspecified: Secondary | ICD-10-CM | POA: Diagnosis not present

## 2018-05-20 NOTE — Telephone Encounter (Signed)
Transition Care Management Follow-up Telephone Call  How have you been since you were released from the hospital? Feeling better just sore.   Do you understand why you were in the hospital? yes   Do you understand the discharge instrcutions? yes  Items Reviewed:  Medications reviewed: yes  Allergies reviewed: yes  Dietary changes reviewed: yes  Referrals reviewed: yes   Functional Questionnaire:   Activities of Daily Living (ADLs):   She states they are independent in the following: ambulation, bathing and hygiene, feeding, continence, grooming, toileting and dressing States they require assistance with the following: Family assist's with any needs.   Any transportation issues/concerns?: no   Any patient concerns? no   Confirmed importance and date/time of follow-up visits scheduled: yes   Confirmed with patient if condition begins to worsen call PCP or go to the ER.  Patient was given the Call-a-Nurse line [phone]: yes

## 2018-05-23 DIAGNOSIS — Z9181 History of falling: Secondary | ICD-10-CM | POA: Diagnosis not present

## 2018-05-23 DIAGNOSIS — M810 Age-related osteoporosis without current pathological fracture: Secondary | ICD-10-CM | POA: Diagnosis not present

## 2018-05-23 DIAGNOSIS — K91873 Postprocedural seroma of a digestive system organ or structure following other procedure: Secondary | ICD-10-CM | POA: Diagnosis not present

## 2018-05-23 DIAGNOSIS — E1151 Type 2 diabetes mellitus with diabetic peripheral angiopathy without gangrene: Secondary | ICD-10-CM | POA: Diagnosis not present

## 2018-05-23 DIAGNOSIS — I120 Hypertensive chronic kidney disease with stage 5 chronic kidney disease or end stage renal disease: Secondary | ICD-10-CM | POA: Diagnosis not present

## 2018-05-23 DIAGNOSIS — N185 Chronic kidney disease, stage 5: Secondary | ICD-10-CM | POA: Diagnosis not present

## 2018-05-23 DIAGNOSIS — T8142XA Infection following a procedure, deep incisional surgical site, initial encounter: Secondary | ICD-10-CM | POA: Diagnosis not present

## 2018-05-23 DIAGNOSIS — E1122 Type 2 diabetes mellitus with diabetic chronic kidney disease: Secondary | ICD-10-CM | POA: Diagnosis not present

## 2018-05-23 DIAGNOSIS — E46 Unspecified protein-calorie malnutrition: Secondary | ICD-10-CM | POA: Diagnosis not present

## 2018-05-23 DIAGNOSIS — J45909 Unspecified asthma, uncomplicated: Secondary | ICD-10-CM | POA: Diagnosis not present

## 2018-05-23 DIAGNOSIS — E039 Hypothyroidism, unspecified: Secondary | ICD-10-CM | POA: Diagnosis not present

## 2018-05-23 DIAGNOSIS — R1312 Dysphagia, oropharyngeal phase: Secondary | ICD-10-CM | POA: Diagnosis not present

## 2018-05-23 DIAGNOSIS — Z7982 Long term (current) use of aspirin: Secondary | ICD-10-CM | POA: Diagnosis not present

## 2018-05-23 DIAGNOSIS — Z48815 Encounter for surgical aftercare following surgery on the digestive system: Secondary | ICD-10-CM | POA: Diagnosis not present

## 2018-05-24 DIAGNOSIS — Z48815 Encounter for surgical aftercare following surgery on the digestive system: Secondary | ICD-10-CM | POA: Diagnosis not present

## 2018-05-25 ENCOUNTER — Ambulatory Visit (INDEPENDENT_AMBULATORY_CARE_PROVIDER_SITE_OTHER): Payer: Medicare Other | Admitting: Internal Medicine

## 2018-05-25 VITALS — BP 160/78 | HR 72 | Temp 98.6°F | Resp 18 | Wt 83.0 lb

## 2018-05-25 DIAGNOSIS — E7800 Pure hypercholesterolemia, unspecified: Secondary | ICD-10-CM | POA: Diagnosis not present

## 2018-05-25 DIAGNOSIS — E1151 Type 2 diabetes mellitus with diabetic peripheral angiopathy without gangrene: Secondary | ICD-10-CM | POA: Diagnosis not present

## 2018-05-25 DIAGNOSIS — I10 Essential (primary) hypertension: Secondary | ICD-10-CM

## 2018-05-25 DIAGNOSIS — E538 Deficiency of other specified B group vitamins: Secondary | ICD-10-CM

## 2018-05-25 DIAGNOSIS — D649 Anemia, unspecified: Secondary | ICD-10-CM

## 2018-05-25 DIAGNOSIS — N185 Chronic kidney disease, stage 5: Secondary | ICD-10-CM

## 2018-05-25 DIAGNOSIS — E034 Atrophy of thyroid (acquired): Secondary | ICD-10-CM

## 2018-05-25 DIAGNOSIS — T8149XA Infection following a procedure, other surgical site, initial encounter: Secondary | ICD-10-CM

## 2018-05-25 DIAGNOSIS — K219 Gastro-esophageal reflux disease without esophagitis: Secondary | ICD-10-CM | POA: Diagnosis not present

## 2018-05-25 DIAGNOSIS — D631 Anemia in chronic kidney disease: Secondary | ICD-10-CM

## 2018-05-25 LAB — CBC WITH DIFFERENTIAL/PLATELET
BASOS PCT: 0.6 % (ref 0.0–3.0)
Basophils Absolute: 0 10*3/uL (ref 0.0–0.1)
EOS ABS: 0.1 10*3/uL (ref 0.0–0.7)
EOS PCT: 0.9 % (ref 0.0–5.0)
HEMATOCRIT: 32.3 % — AB (ref 36.0–46.0)
Hemoglobin: 10.6 g/dL — ABNORMAL LOW (ref 12.0–15.0)
LYMPHS PCT: 25.2 % (ref 12.0–46.0)
Lymphs Abs: 1.7 10*3/uL (ref 0.7–4.0)
MCHC: 32.8 g/dL (ref 30.0–36.0)
MCV: 93.3 fl (ref 78.0–100.0)
MONOS PCT: 8.8 % (ref 3.0–12.0)
Monocytes Absolute: 0.6 10*3/uL (ref 0.1–1.0)
NEUTROS ABS: 4.3 10*3/uL (ref 1.4–7.7)
Neutrophils Relative %: 64.5 % (ref 43.0–77.0)
PLATELETS: 458 10*3/uL — AB (ref 150.0–400.0)
RBC: 3.46 Mil/uL — ABNORMAL LOW (ref 3.87–5.11)
RDW: 17.9 % — AB (ref 11.5–15.5)
WBC: 6.6 10*3/uL (ref 4.0–10.5)

## 2018-05-25 LAB — BASIC METABOLIC PANEL
BUN: 36 mg/dL — ABNORMAL HIGH (ref 6–23)
CO2: 26 meq/L (ref 19–32)
Calcium: 9.4 mg/dL (ref 8.4–10.5)
Chloride: 103 mEq/L (ref 96–112)
Creatinine, Ser: 0.94 mg/dL (ref 0.40–1.20)
GFR: 70.67 mL/min (ref >60.00)
GLUCOSE: 326 mg/dL — AB (ref 70–99)
POTASSIUM: 4.7 meq/L (ref 3.5–5.1)
SODIUM: 137 meq/L (ref 135–145)

## 2018-05-25 LAB — IBC PANEL
Iron: 57 ug/dL (ref 42–145)
SATURATION RATIOS: 20.6 % (ref 20.0–50.0)
Transferrin: 198 mg/dL — ABNORMAL LOW (ref 212.0–360.0)

## 2018-05-25 LAB — SUSCEPTIBILITY RESULT

## 2018-05-25 LAB — VITAMIN B12: Vitamin B-12: 1364 pg/mL — ABNORMAL HIGH (ref 211–911)

## 2018-05-25 LAB — FERRITIN: Ferritin: 136.1 ng/mL (ref 10.0–291.0)

## 2018-05-25 LAB — SUSCEPTIBILITY, AER + ANAEROB

## 2018-05-25 NOTE — Telephone Encounter (Signed)
Updated med list and printed copy while patient was in office today to send with Plan of Care.

## 2018-05-25 NOTE — Progress Notes (Signed)
Patient ID: Tara [name], female   DOB: [birth year], 84 y.o.   MRN: [id number]   Subjective:    Patient ID: Tara [name], female    DOB: 12-09-1917, 84 y.o.   MRN: [id number]  HPI  Patient here for hospital follow up.  She is accompanied by her daughter.  History obtained from both of them.  Is s/p right femoral hernia repair with mesh.  Developed a post op seroma.  Was drained.  Pt developed a wound infection a the site.  Admitted for formal debridement and abx.  Blood pressure noted to be elevated.  Blood pressure medication adjusted and discharged with wound vac and oral abx.  Still on her abx.  Taking her medication.  Daughter reports blood pressure has been better.  States running in the 150s range.  No chest pain.  Breathing stable.  Eating.  No vomiting or diarrhea.     Past Medical History:  Diagnosis Date  . Asthma   . Diabetes mellitus ([place])   . Environmental allergies   . Hypertension    Past Surgical History:  Procedure Laterality Date  . APPENDECTOMY    . APPLICATION OF WOUND VAC Right 05/14/2018   Procedure: APPLICATION OF WOUND VAC;  Surgeon: Benjamine Sprague, DO;  Location: [facility] ORS;  Service: General;  Laterality: Right;  . INCISION AND DRAINAGE ABSCESS Right 05/14/2018   Procedure: INCISION AND DRAINAGE ABSCESS RIGHT GROIN;  Surgeon: Benjamine Sprague, DO;  Location: [facility] ORS;  Service: General;  Laterality: Right;  . INGUINAL HERNIA REPAIR  03/09/2018   Procedure: HERNIA REPAIR INGUINAL INCARCERATED;  Surgeon: Benjamine Sprague, DO;  Location: [facility] ORS;  Service: General;;  . INSERTION OF MESH  03/09/2018   Procedure: INSERTION OF MESH;  Surgeon: Benjamine Sprague, DO;  Location: [facility] ORS;  Service: General;;  . LAPAROSCOPY N/A 03/09/2018   Procedure: LAPAROSCOPY DIAGNOSTIC;  Surgeon: Benjamine Sprague, DO;  Location: [facility] ORS;  Service: General;  Laterality: N/A;  . TUBAL LIGATION     Family History  Problem Relation Age of Onset  . Hypertension Mother   . Cancer Brother        lung  (question)  . Cancer Sister        questionable type   Social History   Socioeconomic History  . Marital status: Widowed    Spouse name: Not on file  . Number of children: Not on file  . Years of education: Not on file  . Highest education level: Not on file  Occupational History  . Not on file  Social Needs  . Financial resource strain: Not on file  . Food insecurity:    Worry: Not on file    Inability: Not on file  . Transportation needs:    Medical: Not on file    Non-medical: Not on file  Tobacco Use  . Smoking status: Never Smoker  . Smokeless tobacco: Never Used  Substance and Sexual Activity  . Alcohol use: No    Alcohol/week: 0.0 standard drinks  . Drug use: No  . Sexual activity: Never  Lifestyle  . Physical activity:    Days per week: Not on file    Minutes per session: Not on file  . Stress: Not on file  Relationships  . Social connections:    Talks on phone: Not on file    Gets together: Not on file    Attends religious service: Not on file    Active member of club or organization: Not on file  Attends meetings of clubs or organizations: Not on file    Relationship status: Not on file  Other Topics Concern  . Not on file  Social History Narrative  . Not on file    Outpatient Encounter Medications as of 05/25/2018  Medication Sig  . acetaminophen (TYLENOL) 325 MG tablet Take 650 mg by mouth every 6 (six) hours as needed for mild pain.   . Amino Acids-Protein Hydrolys (FEEDING SUPPLEMENT, PRO-STAT SUGAR FREE 64,) LIQD Take 30 mLs by mouth 2 (two) times daily between meals.  Marland Kitchen aspirin (ASPIRIN 81) 81 MG EC tablet Take 81 mg by mouth daily. Swallow whole.  . Calcium Carbonate-Vitamin D (CALTRATE 600+D PO) Take 1 tablet by mouth daily.   . diclofenac sodium (VOLTAREN) 1 % GEL Apply 4 g topically 3 (three) times daily as needed. Apply to the Right Knee for effusion  . [EXPIRED] doxycycline (VIBRAMYCIN) 50 MG capsule Take 2 capsules (100 mg total) by  mouth 2 (two) times daily for 10 days.  . famotidine (PEPCID) 10 MG tablet Take 10 mg by mouth 2 (two) times daily.  . feeding supplement, ENSURE ENLIVE, (ENSURE ENLIVE) LIQD Take 237 mLs by mouth 2 (two) times daily between meals.  . fexofenadine (ALLEGRA) 180 MG tablet Take 1 tablet (180 mg total) by mouth daily.  Marland Kitchen glucose blood (ACCU-CHEK AVIVA PLUS) test strip CHECK SUGAR ONCE DAILY  . multivitamin-lutein (OCUVITE-LUTEIN) CAPS capsule Take 1 capsule by mouth daily.  . polyethylene glycol (MIRALAX / GLYCOLAX) packet Take 17 g by mouth daily.  . potassium chloride (K-DUR) 10 MEQ tablet Take 10 mEq by mouth daily.  Marland Kitchen senna-docusate (SENOKOT-S) 8.6-50 MG tablet Take 2 tablets by mouth 2 (two) times daily.  Marland Kitchen SYNTHROID 50 MCG tablet Take 1 tablet (50 mcg total) by mouth daily.  . [DISCONTINUED] hydrALAZINE (APRESOLINE) 25 MG tablet Take 3 tablets (75 mg total) by mouth 3 (three) times daily.  . [DISCONTINUED] metoprolol succinate (TOPROL-XL) 25 MG 24 hr tablet Take 25 mg by mouth 2 (two) times daily.   No facility-administered encounter medications on file as of 05/25/2018.     Review of Systems  Constitutional: Negative for fever.       Eating.    HENT: Negative for congestion and sinus pressure.   Respiratory: Negative for cough, chest tightness and shortness of breath.   Cardiovascular: Negative for chest pain and palpitations.  Gastrointestinal: Negative for abdominal pain, diarrhea, nausea and vomiting.  Genitourinary: Negative for difficulty urinating and dysuria.  Musculoskeletal: Negative for joint swelling and myalgias.  Skin: Negative for color change and rash.  Neurological: Negative for dizziness and headaches.  Psychiatric/Behavioral: Negative for agitation and dysphoric mood.       Objective:    Physical Exam  Constitutional: She appears well-developed and well-nourished. No distress.  HENT:  Nose: Nose normal.  Mouth/Throat: Oropharynx is clear and moist.  Neck:  Neck supple.  Cardiovascular: Normal rate and regular rhythm.  Pulmonary/Chest: Breath sounds normal. No respiratory distress. She has no wheezes.  Abdominal: Soft. Bowel sounds are normal. There is no tenderness.  Wound vac in place.    Musculoskeletal: She exhibits no edema or tenderness.  Lymphadenopathy:    She has no cervical adenopathy.  Skin: No rash noted. No erythema.  Psychiatric: She has a normal mood and affect. Her behavior is normal.    BP (!) 160/78 (BP Location: Left Arm, Patient Position: Sitting, Cuff Size: Normal)   Pulse 72   Temp 98.6 F (37 C) (  Oral)   Resp 18   Wt 83 lb (37.6 kg)   SpO2 98%   BMI 17.35 kg/m  Wt Readings from Last 3 Encounters:  05/27/18 87 lb (39.5 kg)  05/25/18 83 lb (37.6 kg)  05/14/18 78 lb 0.7 oz (35.4 kg)     Lab Results  Component Value Date   WBC 6.6 05/26/2018   HGB 10.5 (L) 05/26/2018   HCT 31.6 (L) 05/26/2018   PLT 424 (H) 05/26/2018   GLUCOSE 271 (H) 05/26/2018   CHOL 209 (H) 06/21/2014   TRIG 112 03/08/2018   HDL 77.00 06/21/2014   LDLDIRECT 107.1 07/29/2013   LDLCALC 111 (H) 06/21/2014   ALT 10 03/16/2018   AST 18 03/16/2018   NA 139 05/26/2018   K 4.7 05/26/2018   CL 106 05/26/2018   CREATININE 1.01 (H) 05/26/2018   BUN 35 (H) 05/26/2018   CO2 24 05/26/2018   TSH 1.765 10/31/2017   INR 1.0 01/25/2013   HGBA1C 7.3 (H) 05/19/2018   MICROALBUR 115.0 Repeated and verified X2. (H) 07/29/2013       Assessment & Plan:   Problem List Items Addressed This Visit    Anemia    hgb decreased.  Recheck cbc, iron studies and B12.        Relevant Orders   CBC with Differential/Platelet (Completed)   Ferritin (Completed)   Vitamin B12 (Completed)   IBC panel (Completed)   B12 deficiency    Recheck B12 level.        Diabetes mellitus with peripheral vascular disease ([place]) - Primary    On no medication now.  Low carb diet and exercise.  Follow met b and a1c.        GERD without esophagitis    Controlled on  current regimen.        Hypercholesteremia    Follow lipid panel.        Hypertension    Blood pressure has been elevated.  Adjusted medication in hospital.  Discussed adding amlodipine.  States has been doing better.  Wants to monitor blood pressure.  If persistent elevation, will need to adjust medication.        Relevant Orders   Basic metabolic panel (Completed)   Hypothyroidism    On thyroid replacement.  Follow tsh.        Wound infection after surgery    S/p debridement and on abx.  Has wound vac.  Continue f/u with surgery.            Einar Pheasant, MD

## 2018-05-26 ENCOUNTER — Encounter: Payer: Self-pay | Admitting: Emergency Medicine

## 2018-05-26 ENCOUNTER — Other Ambulatory Visit: Payer: Self-pay

## 2018-05-26 ENCOUNTER — Observation Stay
Admission: EM | Admit: 2018-05-26 | Discharge: 2018-05-27 | Disposition: A | Discharge status: Home / Self Care | Payer: Medicare Other | Attending: Internal Medicine | Admitting: Internal Medicine

## 2018-05-26 ENCOUNTER — Emergency Department: Payer: Medicare Other

## 2018-05-26 ENCOUNTER — Other Ambulatory Visit: Payer: Self-pay | Admitting: Internal Medicine

## 2018-05-26 DIAGNOSIS — Z880 Allergy status to penicillin: Secondary | ICD-10-CM | POA: Insufficient documentation

## 2018-05-26 DIAGNOSIS — R7989 Other specified abnormal findings of blood chemistry: Secondary | ICD-10-CM | POA: Insufficient documentation

## 2018-05-26 DIAGNOSIS — I160 Hypertensive urgency: Secondary | ICD-10-CM | POA: Diagnosis not present

## 2018-05-26 DIAGNOSIS — I10 Essential (primary) hypertension: Secondary | ICD-10-CM | POA: Diagnosis not present

## 2018-05-26 DIAGNOSIS — Z66 Do not resuscitate: Secondary | ICD-10-CM | POA: Diagnosis not present

## 2018-05-26 DIAGNOSIS — Y838 Other surgical procedures as the cause of abnormal reaction of the patient, or of later complication, without mention of misadventure at the time of the procedure: Secondary | ICD-10-CM | POA: Insufficient documentation

## 2018-05-26 DIAGNOSIS — Z7982 Long term (current) use of aspirin: Secondary | ICD-10-CM | POA: Diagnosis not present

## 2018-05-26 DIAGNOSIS — Z888 Allergy status to other drugs, medicaments and biological substances status: Secondary | ICD-10-CM | POA: Diagnosis not present

## 2018-05-26 DIAGNOSIS — E039 Hypothyroidism, unspecified: Secondary | ICD-10-CM | POA: Diagnosis not present

## 2018-05-26 DIAGNOSIS — E119 Type 2 diabetes mellitus without complications: Secondary | ICD-10-CM | POA: Diagnosis not present

## 2018-05-26 DIAGNOSIS — E43 Unspecified severe protein-calorie malnutrition: Secondary | ICD-10-CM | POA: Diagnosis not present

## 2018-05-26 DIAGNOSIS — M810 Age-related osteoporosis without current pathological fracture: Secondary | ICD-10-CM | POA: Diagnosis not present

## 2018-05-26 DIAGNOSIS — J45909 Unspecified asthma, uncomplicated: Secondary | ICD-10-CM | POA: Diagnosis not present

## 2018-05-26 DIAGNOSIS — E7800 Pure hypercholesterolemia, unspecified: Secondary | ICD-10-CM | POA: Insufficient documentation

## 2018-05-26 DIAGNOSIS — I1311 Hypertensive heart and chronic kidney disease without heart failure, with stage 5 chronic kidney disease, or end stage renal disease: Secondary | ICD-10-CM | POA: Diagnosis not present

## 2018-05-26 DIAGNOSIS — Z79899 Other long term (current) drug therapy: Secondary | ICD-10-CM | POA: Insufficient documentation

## 2018-05-26 DIAGNOSIS — N183 Chronic kidney disease, stage 3 (moderate): Secondary | ICD-10-CM | POA: Insufficient documentation

## 2018-05-26 DIAGNOSIS — T8141XA Infection following a procedure, superficial incisional surgical site, initial encounter: Secondary | ICD-10-CM | POA: Diagnosis not present

## 2018-05-26 DIAGNOSIS — E1122 Type 2 diabetes mellitus with diabetic chronic kidney disease: Secondary | ICD-10-CM | POA: Diagnosis not present

## 2018-05-26 DIAGNOSIS — E1151 Type 2 diabetes mellitus with diabetic peripheral angiopathy without gangrene: Secondary | ICD-10-CM | POA: Diagnosis not present

## 2018-05-26 LAB — BASIC METABOLIC PANEL
ANION GAP: 9 (ref 5–15)
BUN: 35 mg/dL — ABNORMAL HIGH (ref 8–23)
CO2: 24 mmol/L (ref 22–32)
Calcium: 9.1 mg/dL (ref 8.9–10.3)
Chloride: 106 mmol/L (ref 98–111)
Creatinine, Ser: 1.01 mg/dL — ABNORMAL HIGH (ref 0.44–1.00)
GFR, EST AFRICAN AMERICAN: 52 mL/min — AB (ref >60)
GFR, EST NON AFRICAN AMERICAN: 45 mL/min — AB (ref >60)
GLUCOSE: 271 mg/dL — AB (ref 70–99)
POTASSIUM: 4.7 mmol/L (ref 3.5–5.1)
Sodium: 139 mmol/L (ref 135–145)

## 2018-05-26 LAB — CBC
HCT: 31.6 % — ABNORMAL LOW (ref 36.0–46.0)
Hemoglobin: 10.5 g/dL — ABNORMAL LOW (ref 12.0–15.0)
MCH: 30.8 pg (ref 26.0–34.0)
MCHC: 33.2 g/dL (ref 30.0–36.0)
MCV: 92.7 fL (ref 80.0–100.0)
NRBC: 0 % (ref 0.0–0.2)
PLATELETS: 424 10*3/uL — AB (ref 150–400)
RBC: 3.41 MIL/uL — AB (ref 3.87–5.11)
RDW: 18.1 % — ABNORMAL HIGH (ref 11.5–15.5)
WBC: 6.6 10*3/uL (ref 4.0–10.5)

## 2018-05-26 LAB — TROPONIN I: Troponin I: 0.04 ng/mL (ref <0.03)

## 2018-05-26 MED ORDER — HYDRALAZINE HCL 20 MG/ML IJ SOLN
10.0000 mg | Freq: Once | INTRAMUSCULAR | Status: AC
  Administered 2018-05-26: 10 mg via INTRAVENOUS
  Filled 2018-05-26: qty 1

## 2018-05-26 MED ORDER — LABETALOL HCL 5 MG/ML IV SOLN
1.0000 mg/min | INTRAVENOUS | Status: DC
  Administered 2018-05-26: 1 mg/min via INTRAVENOUS
  Administered 2018-05-26: 0.5 mg/min via INTRAVENOUS
  Filled 2018-05-26: qty 100

## 2018-05-26 MED ORDER — HYDRALAZINE HCL 50 MG PO TABS
25.0000 mg | ORAL_TABLET | Freq: Once | ORAL | Status: AC
  Administered 2018-05-26: 25 mg via ORAL
  Filled 2018-05-26: qty 1

## 2018-05-26 MED ORDER — LABETALOL HCL 5 MG/ML IV SOLN
10.0000 mg | INTRAVENOUS | Status: DC | PRN
  Administered 2018-05-27: 02:00:00 10 mg via INTRAVENOUS
  Filled 2018-05-26: qty 4

## 2018-05-26 MED ORDER — HYDRALAZINE HCL 50 MG PO TABS
75.0000 mg | ORAL_TABLET | Freq: Three times a day (TID) | ORAL | Status: DC
  Administered 2018-05-26: 75 mg via ORAL

## 2018-05-26 MED ORDER — HYDRALAZINE HCL 50 MG PO TABS
ORAL_TABLET | ORAL | Status: AC
  Administered 2018-05-26: 75 mg via ORAL
  Filled 2018-05-26: qty 2

## 2018-05-26 NOTE — ED Notes (Signed)
This RN contacted pharmacy. Pharmacy tech informed that they were working on medication and would send as soon as they could.

## 2018-05-26 NOTE — ED Notes (Signed)
Pt denies any chest pain of shortness of breath. Pt states she gets dizzy only when she stands up to walk. Pt states this has been going on for while.

## 2018-05-26 NOTE — ED Notes (Signed)
Pt assisted to bedside commode

## 2018-05-26 NOTE — ED Triage Notes (Signed)
Pt was at her PMD and they took her B/P and sent her here to be evaluated. She denies any chest pain or SOB.

## 2018-05-26 NOTE — ED Notes (Signed)
First Nurse Note: Patient to ED from [facility] with report of HTN during visit this AM.  No BP reading given.  Alert and oriented, sitting in WC.

## 2018-05-26 NOTE — ED Provider Notes (Signed)
[facility] Emergency Department Provider Note       Time seen: ----------------------------------------- 12:05 PM on 05/26/2018 -----------------------------------------   I have reviewed the triage vital signs and the nursing notes.  HISTORY   Chief Complaint Hypertension    HPI Tara [name] is a 84 y.o. female with a history of asthma, diabetes, hypertension who presents to the ED for hypertension.  Patient was at her primary care doctor and they took her blood pressure and sent her here for evaluation when her blood pressure was found to be elevated.  She denies any chest pain or difficulty breathing.  Past Medical History:  Diagnosis Date  . Asthma   . Diabetes mellitus ([place])   . Environmental allergies   . Hypertension     Patient Active Problem List   Diagnosis Date Noted  . Wound infection after surgery 05/14/2018  . Chronic constipation 04/02/2018  . GERD without esophagitis 04/02/2018  . Hypertensive kidney disease with CKD (chronic kidney disease) stage V ([place]) 03/31/2018  . Abdominal pain 03/02/2018  . Hypertensive urgency, malignant 03/02/2018  . Difficulty breathing 11/03/2017  . Knee effusion, right 11/03/2017  . Fracture of superior rim of left pubis, subsequent encounter for fracture with routine healing 11/03/2017  . History of falling 11/03/2017  . Protein-calorie malnutrition, severe 11/01/2017  . Hypertensive urgency 10/31/2017  . Bilateral lower extremity edema 09/07/2017  . Right hand pain 08/25/2017  . Leg pain 08/25/2017  . Right groin hernia 07/03/2017  . Abnormal CT of the abdomen 07/03/2017  . Allergic rhinitis 06/17/2017  . Osteoporosis 03/12/2017  . Nasal obstruction 09/03/2016  . Loss of weight 03/05/2015  . Lower extremity edema 05/15/2013  . Peripheral vascular disease ([place]) 08/29/2012  . Hypertension 06/11/2012  . Hypercholesteremia 06/11/2012  . Diabetes mellitus with peripheral vascular disease ([place])  06/11/2012  . Anemia 06/11/2012  . B12 deficiency 06/11/2012  . Hypothyroidism 06/11/2012    Past Surgical History:  Procedure Laterality Date  . APPENDECTOMY    . APPLICATION OF WOUND VAC Right 05/14/2018   Procedure: APPLICATION OF WOUND VAC;  Surgeon: Benjamine Sprague, DO;  Location: [facility] ORS;  Service: General;  Laterality: Right;  . INCISION AND DRAINAGE ABSCESS Right 05/14/2018   Procedure: INCISION AND DRAINAGE ABSCESS RIGHT GROIN;  Surgeon: Benjamine Sprague, DO;  Location: [facility] ORS;  Service: General;  Laterality: Right;  . INGUINAL HERNIA REPAIR  03/09/2018   Procedure: HERNIA REPAIR INGUINAL INCARCERATED;  Surgeon: Benjamine Sprague, DO;  Location: [facility] ORS;  Service: General;;  . INSERTION OF MESH  03/09/2018   Procedure: INSERTION OF MESH;  Surgeon: Benjamine Sprague, DO;  Location: [facility] ORS;  Service: General;;  . LAPAROSCOPY N/A 03/09/2018   Procedure: LAPAROSCOPY DIAGNOSTIC;  Surgeon: Benjamine Sprague, DO;  Location: [facility] ORS;  Service: General;  Laterality: N/A;  . TUBAL LIGATION      Allergies Ace inhibitors; Hydrochlorothiazide w-triamterene; Lisinopril; Penicillins; and Trichlormethiazide  Social History Social History   Tobacco Use  . Smoking status: Never Smoker  . Smokeless tobacco: Never Used  Substance Use Topics  . Alcohol use: No    Alcohol/week: 0.0 standard drinks  . Drug use: No   Review of Systems Constitutional: Negative for fever. Cardiovascular: Negative for chest pain. Respiratory: Negative for shortness of breath. Gastrointestinal: Negative for abdominal pain, vomiting and diarrhea. Musculoskeletal: Negative for back pain. Skin: Negative for rash. Neurological: Negative for headaches, focal weakness or numbness.  All systems negative/normal/unremarkable except as stated in the HPI  ____________________________________________  PHYSICAL EXAM:  VITAL SIGNS: ED Triage Vitals  Enc Vitals Group     BP 05/26/18 1053 (!) 220/85     Pulse Rate 05/26/18 1053 68      Resp 05/26/18 1053 16     Temp 05/26/18 1053 98.7 F (37.1 C)     Temp Source 05/26/18 1053 Oral     SpO2 05/26/18 1053 99 %     Weight 05/26/18 1055 87 lb (39.5 kg)     Height 05/26/18 1055 4\' 10"  (1.473 m)     Head Circumference --      Peak Flow --      Pain Score 05/26/18 1108 0     Pain Loc --      Pain Edu? --      Excl. in [place]? --    Constitutional: Alert and oriented. Well appearing and in no distress. Eyes: Conjunctivae are normal. Normal extraocular movements. Cardiovascular: Normal rate, regular rhythm. No murmurs, rubs, or gallops. Respiratory: Normal respiratory effort without tachypnea nor retractions. Breath sounds are clear and equal bilaterally. No wheezes/rales/rhonchi. Gastrointestinal: Soft and nontender. Normal bowel sounds Musculoskeletal: Nontender with normal range of motion in extremities. No lower extremity tenderness nor edema. Neurologic:  Normal speech and language. No gross focal neurologic deficits are appreciated.  Skin:  Skin is warm, dry and intact. No rash noted. Psychiatric: Mood and affect are normal. Speech and behavior are normal.  ____________________________________________  EKG: Interpreted by me.  Sinus rhythm with PVCs, rate is 72 bpm, leftward axis, normal QT.  ____________________________________________  ED COURSE:  As part of my medical decision making, I reviewed the following data within the [place] History obtained from family if available, nursing notes, old chart and ekg, as well as notes from prior ED visits. Patient presented for hypertension, we will assess with labs and imaging as indicated at this time.   Procedures ____________________________________________   LABS (pertinent positives/negatives)  Labs Reviewed  BASIC METABOLIC PANEL - Abnormal; Notable for the following components:      Result Value   Glucose, Bld 271 (*)    BUN 35 (*)    Creatinine, Ser 1.01 (*)    GFR calc non Af Amer  45 (*)    GFR calc Af Amer 52 (*)    All other components within normal limits  CBC - Abnormal; Notable for the following components:   RBC 3.41 (*)    Hemoglobin 10.5 (*)    HCT 31.6 (*)    RDW 18.1 (*)    Platelets 424 (*)    All other components within normal limits  TROPONIN I - Abnormal; Notable for the following components:   Troponin I 0.04 (*)    All other components within normal limits   CRITICAL CARE Performed by: Laurence Aly   Total critical care time: 30 minutes  Critical care time was exclusive of separately billable procedures and treating other patients.  Critical care was necessary to treat or prevent imminent or life-threatening deterioration.  Critical care was time spent personally by me on the following activities: development of treatment plan with patient and/or surrogate as well as nursing, discussions with consultants, evaluation of patient's response to treatment, examination of patient, obtaining history from patient or surrogate, ordering and performing treatments and interventions, ordering and review of laboratory studies, ordering and review of radiographic studies, pulse oximetry and re-evaluation of patient's condition.  ____________________________________________  DIFFERENTIAL DIAGNOSIS   Hypertensive emergency, hypertensive urgency, normal evaluation, renal  failure  FINAL ASSESSMENT AND PLAN  Hypertensive urgency, chronically elevated troponin   Plan: The patient had presented for hypertension. Patient's labs not reveal any acute process.  Patient has remained markedly hypertensive here with a blood pressure ranging from 2 70-48 systolic.  We did give initial oral and IV hydralazine with only mild improvement in her blood pressure.  I have discussed with the hospitalist service, we will place her on labetalol drip.   Laurence Aly, MD   Note: This note was generated in part or whole with voice recognition software. Voice  recognition is usually quite accurate but there are transcription errors that can and very often do occur. I apologize for any typographical errors that were not detected and corrected.     Earleen Newport, MD 05/26/18 1452

## 2018-05-26 NOTE — ED Notes (Signed)
2A unable to take pt on a labetalol drip. RN consulted Dr. Bridgett Larsson who gave verbal order to take pt off the labetalol drip in order to admit pt to telemetry instead of stepdown. RN will continue to monitor pts BP and per 2A and ED RNs will reassess admission to telemetry in 1 hour. ED charge made aware.

## 2018-05-26 NOTE — Progress Notes (Signed)
Advanced Care Plan.  Purpose of Encounter: CODE STATUS. Parties in Attendance: The patient, her daughter and 2 granddaughters, me. Patient's Decisional Capacity: Yes. Medical Story: Tara [name]  is a 84 y.o. female with a known history of hypertension, diabetes, CKD stage III, asthma and environmental allergies.    Her blood pressure went up to 260/97.  She is on labetalol drip.  She is being admitted for hypertension malignancy.  I discussed with the patient about her current condition, prognosis and CODE STATUS.  Initially she stated that she does not want to be resuscitated or intubated, then she changed her mind and said that she cannot decide.  I explained to her again about CPR and intubation.  She is still hesitated to decide.  I discussed with the patient's daughter and granddaughters.  I will put full code for now until the patient decide DNR.  The patient was in DNR status before. Goals of Care Determinations: DNR. Plan:  Code Status: Full code. Time spent discussing advance care planning:22 minutes.

## 2018-05-26 NOTE — ED Notes (Signed)
Pt had wet brief. Pt cleaned and changed. Pt repositioned in bed. Pt was also given crackers and peanut butter to eat. MD aware.

## 2018-05-26 NOTE — H&P (Addendum)
[place] [facility] [place] NAME: Tara [name]    MR#:  [id number]  DATE OF BIRTH:  1918/03/06  DATE OF ADMISSION:  05/26/2018  PRIMARY CARE PHYSICIAN: Einar Pheasant, MD   REQUESTING/REFERRING PHYSICIAN: Dr. Jimmye Norman.  CHIEF COMPLAINT:   Chief Complaint  Patient presents with  . Hypertension   High blood pressure. HISTORY OF PRESENT ILLNESS:  Tara [name]  is a 84 y.o. female with a known history of hypertension, diabetes, CKD stage III, asthma and environmental allergies.  The patient went to PCPs office for routine follow-up.  She was found high blood pressure more than 200 and sent to ED for further evaluation.  Blood pressure is up to 260/97.  She is given IV and p.o. hydralazine without improvement.  The last blood pressure is still high at 229/83.  Dr. Jimmye Norman started labetalol IV infusion.  The patient has complaints of dizziness but denies any other symptoms.  PAST MEDICAL HISTORY:   Past Medical History:  Diagnosis Date  . Asthma   . Diabetes mellitus ([place])   . Environmental allergies   . Hypertension     PAST SURGICAL HISTORY:   Past Surgical History:  Procedure Laterality Date  . APPENDECTOMY    . APPLICATION OF WOUND VAC Right 05/14/2018   Procedure: APPLICATION OF WOUND VAC;  Surgeon: Benjamine Sprague, DO;  Location: [facility] ORS;  Service: General;  Laterality: Right;  . INCISION AND DRAINAGE ABSCESS Right 05/14/2018   Procedure: INCISION AND DRAINAGE ABSCESS RIGHT GROIN;  Surgeon: Benjamine Sprague, DO;  Location: [facility] ORS;  Service: General;  Laterality: Right;  . INGUINAL HERNIA REPAIR  03/09/2018   Procedure: HERNIA REPAIR INGUINAL INCARCERATED;  Surgeon: Benjamine Sprague, DO;  Location: [facility] ORS;  Service: General;;  . INSERTION OF MESH  03/09/2018   Procedure: INSERTION OF MESH;  Surgeon: Benjamine Sprague, DO;  Location: [facility] ORS;  Service: General;;  . LAPAROSCOPY N/A 03/09/2018   Procedure: LAPAROSCOPY DIAGNOSTIC;  Surgeon: Benjamine Sprague, DO;  Location: [facility] ORS;  Service: General;  Laterality: N/A;  . TUBAL LIGATION      SOCIAL HISTORY:   Social History   Tobacco Use  . Smoking status: Never Smoker  . Smokeless tobacco: Never Used  Substance Use Topics  . Alcohol use: No    Alcohol/week: 0.0 standard drinks    FAMILY HISTORY:   Family History  Problem Relation Age of Onset  . Hypertension Mother   . Cancer Brother        lung (question)  . Cancer Sister        questionable type    DRUG ALLERGIES:   Allergies  Allergen Reactions  . Ace Inhibitors Other (See Comments)    Angioedema  . Hydrochlorothiazide W-Triamterene Other (See Comments)    Gout flare  . Lisinopril Swelling and Other (See Comments)  . Penicillins Swelling    Has patient had a PCN reaction causing immediate rash, facial/tongue/throat swelling, SOB or lightheadedness with hypotension: Unknown Has patient had a PCN reaction causing severe rash involving mucus membranes or skin necrosis: Unknown Has patient had a PCN reaction that required hospitalization: Unknown Has patient had a PCN reaction occurring within the last 10 years: No If all of the above answers are "NO", then may proceed with Cephalosporin use.   . Trichlormethiazide Other (See Comments)    Gout flare    REVIEW OF SYSTEMS:   Review of Systems  Constitutional: Negative for chills, fever and malaise/fatigue.  HENT:  Negative for sore throat.   Eyes: Negative for blurred vision and double vision.  Respiratory: Negative for cough, hemoptysis, shortness of breath, wheezing and stridor.   Cardiovascular: Negative for chest pain, palpitations, orthopnea and leg swelling.  Gastrointestinal: Negative for abdominal pain, blood in stool, diarrhea, melena, nausea and vomiting.  Genitourinary: Negative for dysuria, flank pain and hematuria.  Musculoskeletal: Negative for back pain and joint pain.  Skin: Negative for rash.  Neurological: Positive for dizziness. Negative  for sensory change, focal weakness, seizures, loss of consciousness, weakness and headaches.  Endo/Heme/Allergies: Negative for polydipsia.  Psychiatric/Behavioral: Negative for depression. The patient is not nervous/anxious.     MEDICATIONS AT HOME:   Prior to Admission medications   Medication Sig Start Date End Date Taking? Authorizing Provider  acetaminophen (TYLENOL) 325 MG tablet Take 650 mg by mouth every 6 (six) hours as needed for mild pain.    Yes [provider]  Amino Acids-Protein Hydrolys (FEEDING SUPPLEMENT, PRO-STAT SUGAR FREE 64,) LIQD Take 30 mLs by mouth 2 (two) times daily between meals.   Yes [provider]  Calcium Carbonate-Vitamin D (CALTRATE 600+D PO) Take 1 tablet by mouth daily.    Yes [provider]  diclofenac sodium (VOLTAREN) 1 % GEL Apply 4 g topically 3 (three) times daily as needed. Apply to the Right Knee for effusion 12/23/17  Yes Einar Pheasant, MD  doxycycline (VIBRAMYCIN) 50 MG capsule Take 2 capsules (100 mg total) by mouth 2 (two) times daily for 10 days. 05/19/18 05/29/18 Yes Sakai, Isami, DO  famotidine (PEPCID) 10 MG tablet Take 10 mg by mouth 2 (two) times daily.   Yes [provider]  feeding supplement, ENSURE ENLIVE, (ENSURE ENLIVE) LIQD Take 237 mLs by mouth 2 (two) times daily between meals. 03/15/18  Yes Mayo, Pete Pelt, MD  fexofenadine (ALLEGRA) 180 MG tablet Take 1 tablet (180 mg total) by mouth daily. 02/10/18  Yes Einar Pheasant, MD  glucose blood (ACCU-CHEK AVIVA PLUS) test strip CHECK SUGAR ONCE DAILY 09/24/15  Yes Einar Pheasant, MD  hydrALAZINE (APRESOLINE) 25 MG tablet Take 3 tablets (75 mg total) by mouth 3 (three) times daily. 05/19/18 06/18/18 Yes Sakai, Isami, DO  metoprolol succinate (TOPROL-XL) 25 MG 24 hr tablet Take 25 mg by mouth 2 (two) times daily.   Yes [provider]  multivitamin-lutein (OCUVITE-LUTEIN) CAPS capsule Take 1 capsule by mouth daily. 05/20/18 06/19/18 Yes Sakai, Isami,  DO  polyethylene glycol (MIRALAX / GLYCOLAX) packet Take 17 g by mouth daily. 03/16/18  Yes Mayo, Pete Pelt, MD  potassium chloride (K-DUR) 10 MEQ tablet Take 10 mEq by mouth daily.   Yes [provider]  senna-docusate (SENOKOT-S) 8.6-50 MG tablet Take 2 tablets by mouth 2 (two) times daily. 03/15/18  Yes Mayo, Pete Pelt, MD  SYNTHROID 50 MCG tablet Take 1 tablet (50 mcg total) by mouth daily. 05/19/18 06/18/18 Yes Sakai, Isami, DO  aspirin (ASPIRIN 81) 81 MG EC tablet Take 81 mg by mouth daily. Swallow whole.    [provider]      VITAL SIGNS:  Blood pressure (!) 229/83, pulse 70, temperature 98.7 F (37.1 C), temperature source Oral, resp. rate (!) 22, height 4\' 10"  (1.473 m), weight 39.5 kg, SpO2 100 %.  PHYSICAL EXAMINATION:  Physical Exam  GENERAL:  84 y.o.-year-old patient lying in the bed with no acute distress.  Severe malnutrition. EYES: Pupils equal, round, reactive to light and accommodation. No scleral icterus. Extraocular muscles intact.  HEENT: Head atraumatic, normocephalic.  Oropharynx and nasopharynx clear.  NECK:  Supple, no jugular venous distention. No thyroid enlargement, no tenderness.  LUNGS: Normal breath sounds bilaterally, no wheezing, rales,rhonchi or crepitation. No use of accessory muscles of respiration.  CARDIOVASCULAR: S1, S2 normal. No murmurs, rubs, or gallops.  ABDOMEN: Soft, nontender, nondistended. Bowel sounds present. No organomegaly or mass.  EXTREMITIES: No pedal edema, cyanosis, or clubbing.  NEUROLOGIC: Cranial nerves II through XII are intact. Muscle strength 5/5 in all extremities. Sensation intact. Gait not checked.  PSYCHIATRIC: The patient is alert and oriented x 3.  SKIN: No obvious rash, lesion, or ulcer.   LABORATORY PANEL:   CBC Recent Labs  Lab 05/26/18 1112  WBC 6.6  HGB 10.5*  HCT 31.6*  PLT 424*    ------------------------------------------------------------------------------------------------------------------  Chemistries  Recent Labs  Lab 05/26/18 1112  NA 139  K 4.7  CL 106  CO2 24  GLUCOSE 271*  BUN 35*  CREATININE 1.01*  CALCIUM 9.1   ------------------------------------------------------------------------------------------------------------------  Cardiac Enzymes Recent Labs  Lab 05/26/18 1112  TROPONINI 0.04*   ------------------------------------------------------------------------------------------------------------------  RADIOLOGY:  Dg Chest 2 View  Result Date: 05/26/2018 CLINICAL DATA:  Hypertension EXAM: CHEST - 2 VIEW COMPARISON:  None. FINDINGS: Cardiomegaly. Tortuous thoracic aorta with calcifications. No confluent airspace opacity, effusion or edema. No acute bony abnormality. IMPRESSION: Cardiomegaly.  Tortuous, atherosclerotic aorta.  No active disease. Electronically Signed   By: Rolm Baptise M.D.   On: 05/26/2018 11:52      IMPRESSION AND PLAN:   Hypertension emergency. The patient will be admitted to telemetry floor. Continue labetalol IV infusion, continue home hydralazine and Lopressor.  Mild elevated troponin due to above.  Continue aspirin, follow-up with troponin.  CKD stage III.  Stable.  Diabetes.  Start sliding scale.  Severe malnutrition.  Follow dietitian's recommendation.  All the records are reviewed and case discussed with ED provider. Management plans discussed with the patient, her daughter and granddaughters, and they are in agreement.  CODE STATUS: Full code  TOTAL TIME TAKING CARE OF THIS PATIENT: 36 minutes.    Demetrios Loll M.D on 05/26/2018 at 3:31 PM  Between 7am to 6pm - Pager - [phone]  After 6pm go to [email or web address] - Proofreader  [facility]  Office  [phone]  CC: Primary care physician; Einar Pheasant, MD   Note: This dictation was prepared with  Dragon dictation along with smaller phrase technology. Any transcriptional errors that result from this process are unin

## 2018-05-27 ENCOUNTER — Other Ambulatory Visit: Payer: Self-pay

## 2018-05-27 DIAGNOSIS — N183 Chronic kidney disease, stage 3 (moderate): Secondary | ICD-10-CM | POA: Diagnosis not present

## 2018-05-27 DIAGNOSIS — E119 Type 2 diabetes mellitus without complications: Secondary | ICD-10-CM | POA: Diagnosis not present

## 2018-05-27 DIAGNOSIS — E43 Unspecified severe protein-calorie malnutrition: Secondary | ICD-10-CM | POA: Diagnosis not present

## 2018-05-27 DIAGNOSIS — I10 Essential (primary) hypertension: Secondary | ICD-10-CM | POA: Diagnosis not present

## 2018-05-27 LAB — GLUCOSE, CAPILLARY
GLUCOSE-CAPILLARY: 209 mg/dL — AB (ref 70–99)
GLUCOSE-CAPILLARY: 115 mg/dL — AB (ref 70–99)
GLUCOSE-CAPILLARY: 291 mg/dL — AB (ref 70–99)

## 2018-05-27 MED ORDER — SENNOSIDES-DOCUSATE SODIUM 8.6-50 MG PO TABS: 1.0000 | ORAL_TABLET | Freq: Every evening | ORAL | Status: DC | PRN

## 2018-05-27 MED ORDER — INSULIN ASPART 100 UNIT/ML ~~LOC~~ SOLN
0.0000 [IU] | Freq: Every day | SUBCUTANEOUS | Status: DC
  Administered 2018-05-27: 2 [IU] via SUBCUTANEOUS
  Filled 2018-05-27: qty 1

## 2018-05-27 MED ORDER — SODIUM CHLORIDE 0.9% FLUSH
3.0000 mL | Freq: Two times a day (BID) | INTRAVENOUS | Status: DC
  Administered 2018-05-27 (×2): 3 mL via INTRAVENOUS

## 2018-05-27 MED ORDER — POTASSIUM CHLORIDE CRYS ER 10 MEQ PO TBCR
10.0000 meq | EXTENDED_RELEASE_TABLET | Freq: Every day | ORAL | Status: DC
  Administered 2018-05-27: 10:00:00 10 meq via ORAL
  Filled 2018-05-27: qty 1

## 2018-05-27 MED ORDER — PRO-STAT SUGAR FREE PO LIQD
30.0000 mL | Freq: Two times a day (BID) | ORAL | Status: DC
  Administered 2018-05-27: 30 mL via ORAL

## 2018-05-27 MED ORDER — ASPIRIN EC 81 MG PO TBEC
81.0000 mg | DELAYED_RELEASE_TABLET | Freq: Every day | ORAL | Status: DC
  Administered 2018-05-27: 81 mg via ORAL
  Filled 2018-05-27: qty 1

## 2018-05-27 MED ORDER — HYDRALAZINE HCL 100 MG PO TABS: 100.0000 mg | ORAL_TABLET | Freq: Three times a day (TID) | ORAL | 2 refills | Status: DC

## 2018-05-27 MED ORDER — AMLODIPINE BESYLATE 10 MG PO TABS
10.0000 mg | ORAL_TABLET | Freq: Every day | ORAL | Status: DC
  Administered 2018-05-27: 10 mg via ORAL
  Filled 2018-05-27 (×2): qty 1

## 2018-05-27 MED ORDER — ACETAMINOPHEN 325 MG PO TABS: 650.0000 mg | ORAL_TABLET | Freq: Four times a day (QID) | ORAL | Status: DC | PRN

## 2018-05-27 MED ORDER — AMLODIPINE BESYLATE 10 MG PO TABS: 10.0000 mg | ORAL_TABLET | Freq: Every day | ORAL | 1 refills | Status: DC

## 2018-05-27 MED ORDER — OCUVITE-LUTEIN PO CAPS
1.0000 | ORAL_CAPSULE | Freq: Every day | ORAL | Status: DC
  Administered 2018-05-27: 10:00:00 1 via ORAL
  Filled 2018-05-27: qty 1

## 2018-05-27 MED ORDER — ENOXAPARIN SODIUM 30 MG/0.3ML ~~LOC~~ SOLN: 30.0000 mg | SUBCUTANEOUS | Status: DC

## 2018-05-27 MED ORDER — LORATADINE 10 MG PO TABS
10.0000 mg | ORAL_TABLET | Freq: Every day | ORAL | Status: DC
  Administered 2018-05-27: 10 mg via ORAL
  Filled 2018-05-27: qty 1

## 2018-05-27 MED ORDER — SODIUM CHLORIDE 0.9 % IV SOLN: 250.0000 mL | INTRAVENOUS | Status: DC | PRN

## 2018-05-27 MED ORDER — BISACODYL 5 MG PO TBEC: 5.0000 mg | DELAYED_RELEASE_TABLET | Freq: Every day | ORAL | Status: DC | PRN

## 2018-05-27 MED ORDER — LABETALOL HCL 100 MG PO TABS
100.0000 mg | ORAL_TABLET | Freq: Two times a day (BID) | ORAL | Status: DC
  Administered 2018-05-27: 10:00:00 100 mg via ORAL
  Filled 2018-05-27 (×2): qty 1

## 2018-05-27 MED ORDER — INSULIN ASPART 100 UNIT/ML ~~LOC~~ SOLN
0.0000 [IU] | Freq: Three times a day (TID) | SUBCUTANEOUS | Status: DC
  Administered 2018-05-27: 13:00:00 5 [IU] via SUBCUTANEOUS
  Filled 2018-05-27: qty 1

## 2018-05-27 MED ORDER — NITROGLYCERIN 2 % TD OINT
0.5000 [in_us] | TOPICAL_OINTMENT | Freq: Four times a day (QID) | TRANSDERMAL | Status: DC
  Administered 2018-05-27 (×2): 0.5 [in_us] via TOPICAL
  Filled 2018-05-27 (×2): qty 1

## 2018-05-27 MED ORDER — LABETALOL HCL 100 MG PO TABS: 100.0000 mg | ORAL_TABLET | Freq: Two times a day (BID) | ORAL | 1 refills | Status: DC

## 2018-05-27 MED ORDER — SODIUM CHLORIDE 0.9% FLUSH: 3.0000 mL | INTRAVENOUS | Status: DC | PRN

## 2018-05-27 MED ORDER — ACETAMINOPHEN 650 MG RE SUPP: 650.0000 mg | Freq: Four times a day (QID) | RECTAL | Status: DC | PRN

## 2018-05-27 MED ORDER — METOPROLOL SUCCINATE ER 25 MG PO TB24
25.0000 mg | ORAL_TABLET | Freq: Two times a day (BID) | ORAL | Status: DC
  Administered 2018-05-27: 02:00:00 25 mg via ORAL
  Filled 2018-05-27: qty 1

## 2018-05-27 MED ORDER — HYDROCODONE-ACETAMINOPHEN 5-325 MG PO TABS
1.0000 | ORAL_TABLET | ORAL | Status: DC | PRN
  Administered 2018-05-27: 1 via ORAL
  Filled 2018-05-27: qty 1

## 2018-05-27 MED ORDER — ONDANSETRON HCL 4 MG/2ML IJ SOLN: 4.0000 mg | Freq: Four times a day (QID) | INTRAMUSCULAR | Status: DC | PRN

## 2018-05-27 MED ORDER — SENNOSIDES-DOCUSATE SODIUM 8.6-50 MG PO TABS
2.0000 | ORAL_TABLET | Freq: Two times a day (BID) | ORAL | Status: DC
  Administered 2018-05-27 (×2): 2 via ORAL
  Filled 2018-05-27 (×2): qty 2

## 2018-05-27 MED ORDER — FAMOTIDINE 20 MG PO TABS
10.0000 mg | ORAL_TABLET | Freq: Two times a day (BID) | ORAL | Status: DC
  Administered 2018-05-27 (×2): 10 mg via ORAL
  Filled 2018-05-27 (×3): qty 1

## 2018-05-27 MED ORDER — ALBUTEROL SULFATE (2.5 MG/3ML) 0.083% IN NEBU: 2.5000 mg | INHALATION_SOLUTION | RESPIRATORY_TRACT | Status: DC | PRN

## 2018-05-27 MED ORDER — LABETALOL HCL 5 MG/ML IV SOLN
20.0000 mg | INTRAVENOUS | Status: DC | PRN
  Administered 2018-05-27: 05:00:00 20 mg via INTRAVENOUS
  Filled 2018-05-27: qty 4

## 2018-05-27 MED ORDER — HYDRALAZINE HCL 50 MG PO TABS
100.0000 mg | ORAL_TABLET | Freq: Three times a day (TID) | ORAL | Status: DC
  Administered 2018-05-27: 100 mg via ORAL
  Filled 2018-05-27: qty 2

## 2018-05-27 MED ORDER — ENSURE ENLIVE PO LIQD
237.0000 mL | Freq: Two times a day (BID) | ORAL | Status: DC
  Administered 2018-05-27: 10:00:00 237 mL via ORAL

## 2018-05-27 MED ORDER — POLYETHYLENE GLYCOL 3350 17 G PO PACK
17.0000 g | PACK | Freq: Every day | ORAL | Status: DC
  Administered 2018-05-27: 10:00:00 17 g via ORAL
  Filled 2018-05-27: qty 1

## 2018-05-27 MED ORDER — ONDANSETRON HCL 4 MG PO TABS: 4.0000 mg | ORAL_TABLET | Freq: Four times a day (QID) | ORAL | Status: DC | PRN

## 2018-05-27 MED ORDER — LEVOTHYROXINE SODIUM 50 MCG PO TABS
50.0000 ug | ORAL_TABLET | Freq: Every day | ORAL | Status: DC
  Administered 2018-05-27: 10:00:00 50 ug via ORAL
  Filled 2018-05-27: qty 1

## 2018-05-27 NOTE — Discharge Summary (Signed)
[facility] NAME: Tara [name]    MR#:  [id number]  DATE OF BIRTH:  01/08/1918  DATE OF ADMISSION:  05/26/2018   ADMITTING PHYSICIAN: Demetrios Loll, MD  DATE OF DISCHARGE:  05/27/18  PRIMARY CARE PHYSICIAN: Einar Pheasant, MD   ADMISSION DIAGNOSIS:   Hypertensive urgency [I16.0] HTN (hypertension), malignant [I10]  DISCHARGE DIAGNOSIS:   Active Problems:   HTN (hypertension), malignant   SECONDARY DIAGNOSIS:   Past Medical History:  Diagnosis Date  . Asthma   . Diabetes mellitus ([place])   . Environmental allergies   . Hypertension     HOSPITAL COURSE:   84 year old female with past medical history significant for hypertension, diabetes and asthma, CKD stage III and right groin wound presents from PCPs office due to elevated blood pressure  1.  Hypertensive urgency-patient was asymptomatic other than headache on presentation -Her medications were adjusted while in the hospital.  Increase the dose of hydralazine.  Change metoprolol to labetalol and Norvasc added.  Optimal blood pressure control obtained.  Patient remains asymptomatic.  Ambulated with physical therapy and so is being discharged home  2.  Infected postop wound in the right groin-following up with surgeons as outpatient.  Continue with wound VAC at home.  Home health has been set up for the same.  And outpatient follow-up with surgeon. -On antibiotics with doxycycline which will be continued  3.  Allergies-continue home medications.  No acute asthma exacerbation noted  4.  Diabetes mellitus-family states patient takes oral medications for diabetes at home.  Unable to find it on her PCPs visit list over her home medication list.  Need to verify with her primary care physician.  No further changes made.  5.  CKD stage III-stable creatinine.  Continue to monitor as outpatient  Patient being discharged home today with home health.  She ambulated with physical  therapy here.  DISCHARGE CONDITIONS:   Guarded  CONSULTS OBTAINED:   None  DRUG ALLERGIES:   Allergies  Allergen Reactions  . Ace Inhibitors Other (See Comments)    Angioedema  . Hydrochlorothiazide W-Triamterene Other (See Comments)    Gout flare  . Lisinopril Swelling and Other (See Comments)  . Penicillins Swelling    Has patient had a PCN reaction causing immediate rash, facial/tongue/throat swelling, SOB or lightheadedness with hypotension: Unknown Has patient had a PCN reaction causing severe rash involving mucus membranes or skin necrosis: Unknown Has patient had a PCN reaction that required hospitalization: Unknown Has patient had a PCN reaction occurring within the last 10 years: No If all of the above answers are "NO", then may proceed with Cephalosporin use.   . Trichlormethiazide Other (See Comments)    Gout flare   DISCHARGE MEDICATIONS:   Allergies as of 05/27/2018      Reactions   Ace Inhibitors Other (See Comments)   Angioedema   Hydrochlorothiazide W-triamterene Other (See Comments)   Gout flare   Lisinopril Swelling, Other (See Comments)   Penicillins Swelling   Has patient had a PCN reaction causing immediate rash, facial/tongue/throat swelling, SOB or lightheadedness with hypotension: Unknown Has patient had a PCN reaction causing severe rash involving mucus membranes or skin necrosis: Unknown Has patient had a PCN reaction that required hospitalization: Unknown Has patient had a PCN reaction occurring within the last 10 years: No If all of the above answers are "NO", then may proceed with Cephalosporin use.   Trichlormethiazide Other (See Comments)   Gout  flare      Medication List    STOP taking these medications   metoprolol succinate 25 MG 24 hr tablet Commonly known as:  TOPROL-XL     TAKE these medications   acetaminophen 325 MG tablet Commonly known as:  TYLENOL Take 650 mg by mouth every 6 (six) hours as needed for mild pain.     amLODipine 10 MG tablet Commonly known as:  NORVASC Take 1 tablet (10 mg total) by mouth daily. Start taking on:  05/28/2018   ASPIRIN 81 81 MG EC tablet Generic drug:  aspirin Take 81 mg by mouth daily. Swallow whole.   CALTRATE 600+D PO Take 1 tablet by mouth daily.   diclofenac sodium 1 % Gel Commonly known as:  VOLTAREN Apply 4 g topically 3 (three) times daily as needed. Apply to the Right Knee for effusion   doxycycline 50 MG capsule Commonly known as:  VIBRAMYCIN Take 2 capsules (100 mg total) by mouth 2 (two) times daily for 10 days.   famotidine 10 MG tablet Commonly known as:  PEPCID Take 10 mg by mouth 2 (two) times daily.   feeding supplement (ENSURE ENLIVE) Liqd Take 237 mLs by mouth 2 (two) times daily between meals.   feeding supplement (PRO-STAT SUGAR FREE 64) Liqd Take 30 mLs by mouth 2 (two) times daily between meals.   fexofenadine 180 MG tablet Commonly known as:  ALLEGRA Take 1 tablet (180 mg total) by mouth daily.   glucose blood test strip CHECK SUGAR ONCE DAILY   hydrALAZINE 100 MG tablet Commonly known as:  APRESOLINE Take 1 tablet (100 mg total) by mouth 3 (three) times daily. What changed:    medication strength  how much to take   labetalol 100 MG tablet Commonly known as:  NORMODYNE Take 1 tablet (100 mg total) by mouth 2 (two) times daily.   multivitamin-lutein Caps capsule Take 1 capsule by mouth daily.   polyethylene glycol packet Commonly known as:  MIRALAX / GLYCOLAX Take 17 g by mouth daily.   potassium chloride 10 MEQ tablet Commonly known as:  K-DUR Take 10 mEq by mouth daily.   senna-docusate 8.6-50 MG tablet Commonly known as:  Senokot-S Take 2 tablets by mouth 2 (two) times daily.   SYNTHROID 50 MCG tablet Generic drug:  levothyroxine Take 1 tablet (50 mcg total) by mouth daily.        DISCHARGE INSTRUCTIONS:   1.  PCP follow-up in 1 to 2 weeks 2.  Surgeon follow-up in 2 weeks  DIET:   Cardiac  diet  ACTIVITY:   Activity as tolerated  OXYGEN:   Home Oxygen: No.  Oxygen Delivery: room air  DISCHARGE LOCATION:   home   If you experience worsening of your admission symptoms, develop shortness of breath, life threatening emergency, suicidal or homicidal thoughts you must seek medical attention immediately by calling 911 or calling your MD immediately  if symptoms less severe.  You Must read complete instructions/literature along with all the possible adverse reactions/side effects for all the Medicines you take and that have been prescribed to you. Take any new Medicines after you have completely understood and accpet all the possible adverse reactions/side effects.   Please note  You were cared for by a hospitalist during your hospital stay. If you have any questions about your discharge medications or the care you received while you were in the hospital after you are discharged, you can call the unit and asked to speak with the  hospitalist on call if the hospitalist that took care of you is not available. Once you are discharged, your primary care physician will handle any further medical issues. Please note that NO REFILLS for any discharge medications will be authorized once you are discharged, as it is imperative that you return to your primary care physician (or establish a relationship with a primary care physician if you do not have one) for your aftercare needs so that they can reassess your need for medications and monitor your lab values.    On the day of Discharge:  VITAL SIGNS:   Blood pressure (!) 142/69, pulse 77, temperature 98.8 F (37.1 C), temperature source Oral, resp. rate 16, height 4\' 10"  (1.473 m), weight 39.5 kg, SpO2 98 %.  PHYSICAL EXAMINATION:    GENERAL:  84 y.o.-year-old elderly patient lying in the bed with no acute distress.  EYES: Pupils equal, round, reactive to light and accommodation. No scleral icterus. Extraocular muscles intact.  HEENT:  Head atraumatic, normocephalic. Oropharynx and nasopharynx clear.  NECK:  Supple, no jugular venous distention. No thyroid enlargement, no tenderness.  LUNGS: Normal breath sounds bilaterally, no wheezing, rales,rhonchi or crepitation. No use of accessory muscles of respiration. Decreased bibasilar breath sounds CARDIOVASCULAR: S1, S2 normal. No  rubs, or gallops. 2/6 systolic murmur present ABDOMEN: Soft, non-tender, non-distended. Bowel sounds present. No organomegaly or mass. Right groin- deep wound noted- no active discharge seen EXTREMITIES: No pedal edema, cyanosis, or clubbing.  NEUROLOGIC: Cranial nerves II through XII are intact. Muscle strength 5/5 in all extremities. Sensation intact. Gait not checked. Global weakness PSYCHIATRIC: The patient is alert and oriented x 3.  SKIN: No obvious rash, lesion, or ulcer.   DATA REVIEW:   CBC Recent Labs  Lab 05/26/18 1112  WBC 6.6  HGB 10.5*  HCT 31.6*  PLT 424*    Chemistries  Recent Labs  Lab 05/26/18 1112  NA 139  K 4.7  CL 106  CO2 24  GLUCOSE 271*  BUN 35*  CREATININE 1.01*  CALCIUM 9.1     Microbiology Results  Results for orders placed or performed during the hospital encounter of 05/14/18  MRSA PCR Screening     Status: None   Collection Time: 05/14/18 12:54 PM  Result Value Ref Range Status   MRSA by PCR NEGATIVE NEGATIVE Final    Comment:        The GeneXpert MRSA Assay (FDA approved for NASAL specimens only), is one component of a comprehensive MRSA colonization surveillance program. It is not intended to diagnose MRSA infection nor to guide or monitor treatment for MRSA infections. Performed at [facility], [address] [place], [place] [address]   Aerobic/Anaerobic Culture (surgical/deep wound)     Status: None (Preliminary result)   Collection Time: 05/14/18  6:43 PM  Result Value Ref Range Status   Specimen Description TISSUE RIGHT GROIN  Final   Special Requests FROM MESH  Final    Gram Stain   Final    MODERATE WBC PRESENT,BOTH PMN AND MONONUCLEAR FEW GRAM NEGATIVE RODS    Culture   Final    RARE STAPHYLOCOCCUS EPIDERMIDIS RARE STAPHYLOCOCCUS LUGDUNENSIS Sent to [facility] for further susceptibility testing. NO ANAEROBES ISOLATED Performed at [facility], [place] [address] [place], [place] [address]    Report Status PENDING  Incomplete   Organism ID, Bacteria STAPHYLOCOCCUS EPIDERMIDIS  Final      Susceptibility   Staphylococcus epidermidis - MIC*    CIPROFLOXACIN 4 RESISTANT  Resistant     ERYTHROMYCIN >=8 RESISTANT Resistant     GENTAMICIN >=16 RESISTANT Resistant     OXACILLIN >=4 RESISTANT Resistant     TETRACYCLINE 2 SENSITIVE Sensitive     VANCOMYCIN 1 SENSITIVE Sensitive     TRIMETH/SULFA 160 RESISTANT Resistant     CLINDAMYCIN >=8 RESISTANT Resistant     RIFAMPIN <=0.5 SENSITIVE Sensitive     Inducible Clindamycin NEGATIVE Sensitive     * RARE STAPHYLOCOCCUS EPIDERMIDIS  Susceptibility, Aer + Anaerob     Status: None   Collection Time: 05/14/18  6:43 PM  Result Value Ref Range Status   Suscept, Aer + Anaerob Final report  Corrected    Comment: (NOTE) Performed At: [facility] [address] [place], [place] [id number] Rush Farmer MD AS:[id number] CORRECTED ON 10/15 AT 5: PREVIOUSLY REPORTED AS Preliminary report    Source of Sample WOUND  Final    Comment: Performed at [facility], [place] [address], [place], [place] [address]  Susceptibility Result     Status: Abnormal   Collection Time: 05/14/18  6:43 PM  Result Value Ref Range Status   Suscept Result 1 Comment (A)  Final    Comment: (NOTE) Staphylococcus lugdunensis Identification performed by account, not confirmed by this laboratory. Most isolates of Staphylococcus sp. produce a beta-lactamase enzyme rendering them resistant to penicillin. Please contact the laboratory if penicillin is being considered for therapy.    Antimicrobial Suscept Comment  Final     Comment: (NOTE)      ** S = Susceptible; I = Intermediate; R = Resistant **                   P = Positive; N = Negative            MICS are expressed in micrograms per mL   Antibiotic                 RSLT#1    RSLT#2    RSLT#3    RSLT#4 Ciprofloxacin                  S Clindamycin                    S Erythromycin                   S Gentamicin                     S Levofloxacin                   S Oxacillin                      S Rifampin                       S Tetracycline                   S Trimethoprim/Sulfa             S Vancomycin                     S Performed At: [facility] [place], [place] [id number] Rush Farmer MD KG:[id number]     RADIOLOGY:  No results found.   Management plans discussed with the patient, family and they are in agreement.  CODE STATUS:     Code  Status Orders  (From admission, onward)         Start     Ordered   05/27/18 0104  Full code  Continuous     05/27/18 0103        Code Status History    Date Active Date Inactive Code Status Order ID Comments User Context   05/18/2018 1949 05/19/2018 1930 DNR [id number]  Nicholes Mango, MD Inpatient   05/14/2018 1437 05/18/2018 1949 Full Code [id number]  Benjamine Sprague, DO Inpatient   03/02/2018 0620 03/16/2018 1942 Full Code [id number]  Arta Silence, MD Inpatient   10/31/2017 0857 11/02/2017 2200 Full Code [id number]  Harrie Foreman, MD Inpatient      TOTAL TIME TAKING CARE OF THIS PATIENT: 38 minutes.    Gladstone Lighter M.D on 05/27/2018 at 3:20 PM  Between 7am to 6pm - Pager - [phone]  After 6pm go to [email or web address] - Proofreader  [facility]  Office  [phone]  CC: Primary care physician; Einar Pheasant, MD   Note: This dictation was prepared with Dragon dictation along with smaller phrase technology. Any transcriptional errors that result from this process are unintentional.

## 2018-05-27 NOTE — Care Management (Signed)
Patient currently followed by [place] RN.  Notified agency of discharge.

## 2018-05-27 NOTE — ED Notes (Addendum)
Patient transported to room 111 by this EDT via stretcher.

## 2018-05-27 NOTE — Care Management CC44 (Signed)
Condition Code 44 Documentation Completed  Patient Details  Name: Tara [name] MRN: [id number] Date of Birth: 1918-06-14   Condition Code 44 given:  Yes Patient signature on Condition Code 44 notice:  Yes Documentation of 2 MD's agreement:  Yes Code 44 added to claim:  Yes    Katrina Stack, RN 05/27/2018, 2:00 PM

## 2018-05-27 NOTE — Care Management Obs Status (Signed)
[facility] NOTIFICATION   Patient Details  Name: Tara [name] MRN: [id number] Date of Birth: November 06, 1917   Medicare Observation Status Notification Given:  Yes    Katrina Stack, RN 05/27/2018, 2:00 PM

## 2018-05-27 NOTE — Progress Notes (Signed)
Physical Therapy Evaluation Patient Details Name: Tara [name] MRN: [id number] DOB: 1918-05-24 Today's Date: 05/27/2018   History of Present Illness  Pt presents with dizziness and admitted for HTN on 05/26/2018. Pt's PMH includes HRN, DM, CKD stage III, asthma, and environmental allergies. Imagaing showed cardiomegaly, tortuous, atherosclerotic aorta, no active disease. Per pt no recent falls.  Clinical Impression  Pt is a pleasant 84 year old female who was admitted for HTN. Pt performs bed mobility Mod I, transfers and ambulation with CGA and RW for UE support. Pt demonstrates deficits with strength, mobility, balance, and activity tolerance. Pt is unsteady on her feet with all activity, has poor-fair ability to maneuver obstacles. Pt demonstrates abilities indicative of a person at higher risk for recurrent falls. Pt is not at her baseline. Would benefit from skilled PT to address above deficits and promote optimal return to PLOF. This entire session was guided, instructed, and directly supervised by Greggory Stallion, DPT.    Follow Up Recommendations Home health PT    Equipment Recommendations  None recommended by PT    Recommendations for Other Services       Precautions / Restrictions Precautions Precautions: Fall Restrictions Weight Bearing Restrictions: No      Mobility  Bed Mobility Overal bed mobility: Modified Independent             General bed mobility comments: Performs safe bed mobility requiring increased time to perform. Returning to supine pt uses BUE to lift LLE onto bed.  Transfers Overall transfer level: Needs assistance Equipment used: Rolling walker (2 wheeled) Transfers: Sit to/from Stand Sit to Stand: Min guard         General transfer comment: Pt has poor anterior translation. VC to scoot forward on bed prior to stand to improve translation. Has safe hand placement. Multiple attempts required to come to stand. Rocks forward and backward use  momentum to come to stand.  Ambulation/Gait Ambulation/Gait assistance: Min guard Gait Distance (Feet): 100 Feet Assistive device: Rolling walker (2 wheeled) Gait Pattern/deviations: Step-through pattern;Decreased stride length;Decreased weight shift to left;Drifts right/left(drifts R) Gait velocity: decreased   General Gait Details: Pt is slightly unsteady on feet. Drifts right requires physical assist of RW to adjust direction, pt unaware.  Stairs            Wheelchair Mobility    Modified Rankin (Stroke Patients Only)       Balance Overall balance assessment: Needs assistance Sitting-balance support: Feet supported;No upper extremity supported Sitting balance-Leahy Scale: Good     Standing balance support: Bilateral upper extremity supported;During functional activity Standing balance-Leahy Scale: Fair Standing balance comment: Pt slightly unsteady. No apparent LOB.                             Pertinent Vitals/Pain Pain Assessment: No/denies pain    Home Living Family/patient expects to be discharged to:: Private residence Living Arrangements: Alone Available Help at Discharge: Family;Personal care attendant;Available 24 hours/day Type of Home: House Home Access: Ramped entrance     Home Layout: One level Home Equipment: Walker - 2 wheels;Walker - 4 wheels;Shower seat      Prior Function Level of Independence: Needs assistance   Gait / Transfers Assistance Needed: Ambulates with RW in household, unable to recall last time she fell.  ADL's / Homemaking Assistance Needed: Daughter assists with meals, errands, meds, bathing, and dressing        Hand Dominance   Dominant Hand:  Right    Extremity/Trunk Assessment   Upper Extremity Assessment Upper Extremity Assessment: Generalized weakness(BUE 4/5)    Lower Extremity Assessment Lower Extremity Assessment: Generalized weakness(RLE 4/5 LLE 4+/5)       Communication   Communication:  HOH(Wears hearing aids)  Cognition Arousal/Alertness: Awake/alert Behavior During Therapy: WFL for tasks assessed/performed Overall Cognitive Status: Within Functional Limits for tasks assessed                                        General Comments      Exercises     Assessment/Plan    PT Assessment Patient needs continued PT services  PT Problem List Decreased strength;Decreased activity tolerance;Decreased balance;Decreased mobility       PT Treatment Interventions DME instruction;Gait training;Functional mobility training;Therapeutic activities;Therapeutic exercise;Balance training;Patient/family education    PT Goals (Current goals can be found in the Care Plan section)  Acute Rehab PT Goals Patient Stated Goal: to go home PT Goal Formulation: With patient Time For Goal Achievement: 06/10/18 Potential to Achieve Goals: Good Additional Goals Additional Goal #1: Pt will perform bed mobility/transfers independently for improved functional mobility in the home.    Frequency Min 2X/week   Barriers to discharge        Co-evaluation               AM-PAC PT "6 Clicks" Daily Activity  Outcome Measure Difficulty turning over in bed (including adjusting bedclothes, sheets and blankets)?: A Little Difficulty moving from lying on back to sitting on the side of the bed? : A Little Difficulty sitting down on and standing up from a chair with arms (e.g., wheelchair, bedside commode, etc,.)?: Unable Help needed moving to and from a bed to chair (including a wheelchair)?: A Little Help needed walking in hospital room?: A Little Help needed climbing 3-5 steps with a railing? : A Little 6 Click Score: 16    End of Session Equipment Utilized During Treatment: Gait belt Activity Tolerance: Patient tolerated treatment well Patient left: in bed;with call bell/phone within reach;with bed alarm set;with family/visitor present Nurse Communication: Mobility  status;Other (comment)(dc recommendations) PT Visit Diagnosis: Unsteadiness on feet (R26.81);Muscle weakness (generalized) (M62.81)    Time: 0092-3300 PT Time Calculation (min) (ACUTE ONLY): 26 min   Charges:             Algis Downs, SPT  05/27/2018, 2:48 PM

## 2018-05-27 NOTE — Progress Notes (Signed)
Pt is d/ced home w/daughter.  Blood pressure is more stable w/ numerous blood pressure meds given.  Adjusting meds for d/c.  Surgeon consulted for the hernia post surgical site.  Was told not to put on wound vac but to put clean dressing on and she will be seen by home health nurse tomorrow - per Dr. Tressia Miners.  Removed IV.  REviewed d/c instructions, meds and f/u appts w/daughter.  Pt will transport home with daughter.

## 2018-05-28 ENCOUNTER — Ambulatory Visit: Payer: Self-pay | Admitting: *Deleted

## 2018-05-28 DIAGNOSIS — E1151 Type 2 diabetes mellitus with diabetic peripheral angiopathy without gangrene: Secondary | ICD-10-CM | POA: Diagnosis not present

## 2018-05-28 DIAGNOSIS — Z7982 Long term (current) use of aspirin: Secondary | ICD-10-CM | POA: Diagnosis not present

## 2018-05-28 DIAGNOSIS — I120 Hypertensive chronic kidney disease with stage 5 chronic kidney disease or end stage renal disease: Secondary | ICD-10-CM | POA: Diagnosis not present

## 2018-05-28 DIAGNOSIS — E1122 Type 2 diabetes mellitus with diabetic chronic kidney disease: Secondary | ICD-10-CM | POA: Diagnosis not present

## 2018-05-28 DIAGNOSIS — Z9181 History of falling: Secondary | ICD-10-CM | POA: Diagnosis not present

## 2018-05-28 DIAGNOSIS — R1312 Dysphagia, oropharyngeal phase: Secondary | ICD-10-CM | POA: Diagnosis not present

## 2018-05-28 DIAGNOSIS — N185 Chronic kidney disease, stage 5: Secondary | ICD-10-CM | POA: Diagnosis not present

## 2018-05-28 DIAGNOSIS — Z48815 Encounter for surgical aftercare following surgery on the digestive system: Secondary | ICD-10-CM | POA: Diagnosis not present

## 2018-05-28 DIAGNOSIS — M810 Age-related osteoporosis without current pathological fracture: Secondary | ICD-10-CM | POA: Diagnosis not present

## 2018-05-28 DIAGNOSIS — J45909 Unspecified asthma, uncomplicated: Secondary | ICD-10-CM | POA: Diagnosis not present

## 2018-05-28 DIAGNOSIS — E46 Unspecified protein-calorie malnutrition: Secondary | ICD-10-CM | POA: Diagnosis not present

## 2018-05-28 MED ORDER — INSULIN GLARGINE 100 UNIT/ML SOLOSTAR PEN: PEN_INJECTOR | SUBCUTANEOUS | 5 refills | Status: DC

## 2018-05-28 MED ORDER — PEN NEEDLES 32G X 4 MM MISC: 1.0000 "application " | Freq: Every day | 1 refills | Status: DC

## 2018-05-28 NOTE — Telephone Encounter (Signed)
Mardene Celeste, nurse with [facility] calling to report that the pt has a blood glucose of 313. Nurse reports that the pt did eat pancakes with no syrup, rasin bran and applesauce today and has been staying hydrated. Pt reports that she has not been seeing well today. Nurse reports that the pt has experienced dizziness because of her BP being increased and sometimes experiences frequent urination.Pt is not currently on any diabetic medications. Mardene Celeste can be contacted at [phone]. Reason for Disposition . [1] Blood glucose > 300 mg/dL (16.7 mmol/L) AND [2] two or more times in a row  Answer Assessment - Initial Assessment Questions 1. BLOOD GLUCOSE: "What is your blood glucose level?"      313 2. ONSET: "When did you check the blood glucose?"     Prior to calling office 3. USUAL RANGE: "What is your glucose level usually?" (e.g., usual fasting morning value, usual evening value)     Usually does not check blood glucose everyday, noted to be greater than 300 on last office visit 4. KETONES: "Do you check for ketones (urine or blood test strips)?" If yes, ask: "What does the test show now?"      n/a 5. TYPE 1 or 2:  "Do you know what type of diabetes you have?"  (e.g., Type 1, Type 2, Gestational; doesn't know)      Type 2 6. INSULIN: "Do you take insulin?" "What type of insulin(s) do you use? What is the mode of delivery? (syringe, pen (e.g., injection or  pump)?"      No 7. DIABETES PILLS: "Do you take any pills for your diabetes?" If yes, ask: "Have you missed taking any pills recently?"     No 8. OTHER SYMPTOMS: "Do you have any symptoms?" (e.g., fever, frequent urination, difficulty breathing, dizziness, weakness, vomiting)     States not able to see as well today, urinates frequently sometimes and sometimes she does not, states she has dizziness sometimes because of high blood pressure 9. PREGNANCY: "Is there any chance you are pregnant?" "When was your last menstrual period?"  n/a  Protocols used: DIABETES - HIGH BLOOD SUGAR-A-AH

## 2018-05-28 NOTE — Telephone Encounter (Signed)
Notified Mardene Celeste from [facility] she will have patient daughter pick up and make sure they know how to use correctly.

## 2018-05-28 NOTE — Telephone Encounter (Signed)
She was previously on lantus 6 units per day.  Would like to restart.  Can send in rx for lantus if does not have.  Confirm ok

## 2018-05-30 ENCOUNTER — Encounter: Payer: Self-pay | Admitting: Internal Medicine

## 2018-05-30 NOTE — Assessment & Plan Note (Signed)
Follow lipid panel.   

## 2018-05-30 NOTE — Assessment & Plan Note (Signed)
S/p debridement and on abx.  Has wound vac.  Continue f/u with surgery.

## 2018-05-30 NOTE — Assessment & Plan Note (Signed)
On thyroid replacement.  Follow tsh.  

## 2018-05-30 NOTE — Assessment & Plan Note (Signed)
Recheck B12 level 

## 2018-05-30 NOTE — Assessment & Plan Note (Signed)
Blood pressure has been elevated.  Adjusted medication in hospital.  Discussed adding amlodipine.  States has been doing better.  Wants to monitor blood pressure.  If persistent elevation, will need to adjust medication.

## 2018-05-30 NOTE — Assessment & Plan Note (Signed)
hgb decreased.  Recheck cbc, iron studies and B12.

## 2018-05-30 NOTE — Assessment & Plan Note (Signed)
Controlled on current regimen.   

## 2018-05-30 NOTE — Assessment & Plan Note (Signed)
On no medication now.  Low carb diet and exercise.  Follow met b and a1c.

## 2018-05-31 DIAGNOSIS — Z48815 Encounter for surgical aftercare following surgery on the digestive system: Secondary | ICD-10-CM | POA: Diagnosis not present

## 2018-05-31 DIAGNOSIS — Z9181 History of falling: Secondary | ICD-10-CM | POA: Diagnosis not present

## 2018-05-31 DIAGNOSIS — E46 Unspecified protein-calorie malnutrition: Secondary | ICD-10-CM | POA: Diagnosis not present

## 2018-05-31 DIAGNOSIS — E1122 Type 2 diabetes mellitus with diabetic chronic kidney disease: Secondary | ICD-10-CM | POA: Diagnosis not present

## 2018-05-31 DIAGNOSIS — J45909 Unspecified asthma, uncomplicated: Secondary | ICD-10-CM | POA: Diagnosis not present

## 2018-05-31 DIAGNOSIS — E1151 Type 2 diabetes mellitus with diabetic peripheral angiopathy without gangrene: Secondary | ICD-10-CM | POA: Diagnosis not present

## 2018-05-31 DIAGNOSIS — N185 Chronic kidney disease, stage 5: Secondary | ICD-10-CM | POA: Diagnosis not present

## 2018-05-31 DIAGNOSIS — R1312 Dysphagia, oropharyngeal phase: Secondary | ICD-10-CM | POA: Diagnosis not present

## 2018-05-31 DIAGNOSIS — I120 Hypertensive chronic kidney disease with stage 5 chronic kidney disease or end stage renal disease: Secondary | ICD-10-CM | POA: Diagnosis not present

## 2018-05-31 DIAGNOSIS — M810 Age-related osteoporosis without current pathological fracture: Secondary | ICD-10-CM | POA: Diagnosis not present

## 2018-05-31 DIAGNOSIS — K91873 Postprocedural seroma of a digestive system organ or structure following other procedure: Secondary | ICD-10-CM | POA: Diagnosis not present

## 2018-05-31 DIAGNOSIS — Z7982 Long term (current) use of aspirin: Secondary | ICD-10-CM | POA: Diagnosis not present

## 2018-05-31 DIAGNOSIS — E039 Hypothyroidism, unspecified: Secondary | ICD-10-CM | POA: Diagnosis not present

## 2018-05-31 DIAGNOSIS — T8142XA Infection following a procedure, deep incisional surgical site, initial encounter: Secondary | ICD-10-CM | POA: Diagnosis not present

## 2018-06-01 DIAGNOSIS — J45909 Unspecified asthma, uncomplicated: Secondary | ICD-10-CM | POA: Diagnosis not present

## 2018-06-01 DIAGNOSIS — M810 Age-related osteoporosis without current pathological fracture: Secondary | ICD-10-CM | POA: Diagnosis not present

## 2018-06-01 DIAGNOSIS — I120 Hypertensive chronic kidney disease with stage 5 chronic kidney disease or end stage renal disease: Secondary | ICD-10-CM | POA: Diagnosis not present

## 2018-06-01 DIAGNOSIS — Z48815 Encounter for surgical aftercare following surgery on the digestive system: Secondary | ICD-10-CM | POA: Diagnosis not present

## 2018-06-01 DIAGNOSIS — N185 Chronic kidney disease, stage 5: Secondary | ICD-10-CM | POA: Diagnosis not present

## 2018-06-01 DIAGNOSIS — Z9181 History of falling: Secondary | ICD-10-CM | POA: Diagnosis not present

## 2018-06-01 DIAGNOSIS — Z7982 Long term (current) use of aspirin: Secondary | ICD-10-CM | POA: Diagnosis not present

## 2018-06-01 DIAGNOSIS — E46 Unspecified protein-calorie malnutrition: Secondary | ICD-10-CM | POA: Diagnosis not present

## 2018-06-01 DIAGNOSIS — E1122 Type 2 diabetes mellitus with diabetic chronic kidney disease: Secondary | ICD-10-CM | POA: Diagnosis not present

## 2018-06-01 DIAGNOSIS — R1312 Dysphagia, oropharyngeal phase: Secondary | ICD-10-CM | POA: Diagnosis not present

## 2018-06-01 DIAGNOSIS — E1151 Type 2 diabetes mellitus with diabetic peripheral angiopathy without gangrene: Secondary | ICD-10-CM | POA: Diagnosis not present

## 2018-06-02 DIAGNOSIS — E039 Hypothyroidism, unspecified: Secondary | ICD-10-CM | POA: Diagnosis not present

## 2018-06-02 DIAGNOSIS — J45909 Unspecified asthma, uncomplicated: Secondary | ICD-10-CM | POA: Diagnosis not present

## 2018-06-02 DIAGNOSIS — I120 Hypertensive chronic kidney disease with stage 5 chronic kidney disease or end stage renal disease: Secondary | ICD-10-CM | POA: Diagnosis not present

## 2018-06-02 DIAGNOSIS — Z9181 History of falling: Secondary | ICD-10-CM | POA: Diagnosis not present

## 2018-06-02 DIAGNOSIS — N185 Chronic kidney disease, stage 5: Secondary | ICD-10-CM | POA: Diagnosis not present

## 2018-06-02 DIAGNOSIS — E1122 Type 2 diabetes mellitus with diabetic chronic kidney disease: Secondary | ICD-10-CM | POA: Diagnosis not present

## 2018-06-02 DIAGNOSIS — Z48815 Encounter for surgical aftercare following surgery on the digestive system: Secondary | ICD-10-CM | POA: Diagnosis not present

## 2018-06-02 DIAGNOSIS — R1312 Dysphagia, oropharyngeal phase: Secondary | ICD-10-CM | POA: Diagnosis not present

## 2018-06-02 DIAGNOSIS — Z7982 Long term (current) use of aspirin: Secondary | ICD-10-CM | POA: Diagnosis not present

## 2018-06-02 DIAGNOSIS — E46 Unspecified protein-calorie malnutrition: Secondary | ICD-10-CM | POA: Diagnosis not present

## 2018-06-02 DIAGNOSIS — K91873 Postprocedural seroma of a digestive system organ or structure following other procedure: Secondary | ICD-10-CM | POA: Diagnosis not present

## 2018-06-02 DIAGNOSIS — T8142XA Infection following a procedure, deep incisional surgical site, initial encounter: Secondary | ICD-10-CM | POA: Diagnosis not present

## 2018-06-02 DIAGNOSIS — M810 Age-related osteoporosis without current pathological fracture: Secondary | ICD-10-CM | POA: Diagnosis not present

## 2018-06-02 DIAGNOSIS — E1151 Type 2 diabetes mellitus with diabetic peripheral angiopathy without gangrene: Secondary | ICD-10-CM | POA: Diagnosis not present

## 2018-06-04 DIAGNOSIS — M810 Age-related osteoporosis without current pathological fracture: Secondary | ICD-10-CM | POA: Diagnosis not present

## 2018-06-04 DIAGNOSIS — Z9181 History of falling: Secondary | ICD-10-CM | POA: Diagnosis not present

## 2018-06-04 DIAGNOSIS — Z7982 Long term (current) use of aspirin: Secondary | ICD-10-CM | POA: Diagnosis not present

## 2018-06-04 DIAGNOSIS — I120 Hypertensive chronic kidney disease with stage 5 chronic kidney disease or end stage renal disease: Secondary | ICD-10-CM | POA: Diagnosis not present

## 2018-06-04 DIAGNOSIS — Z48815 Encounter for surgical aftercare following surgery on the digestive system: Secondary | ICD-10-CM | POA: Diagnosis not present

## 2018-06-04 DIAGNOSIS — J45909 Unspecified asthma, uncomplicated: Secondary | ICD-10-CM | POA: Diagnosis not present

## 2018-06-04 DIAGNOSIS — E1151 Type 2 diabetes mellitus with diabetic peripheral angiopathy without gangrene: Secondary | ICD-10-CM | POA: Diagnosis not present

## 2018-06-04 DIAGNOSIS — E46 Unspecified protein-calorie malnutrition: Secondary | ICD-10-CM | POA: Diagnosis not present

## 2018-06-04 DIAGNOSIS — R1312 Dysphagia, oropharyngeal phase: Secondary | ICD-10-CM | POA: Diagnosis not present

## 2018-06-04 DIAGNOSIS — N185 Chronic kidney disease, stage 5: Secondary | ICD-10-CM | POA: Diagnosis not present

## 2018-06-04 DIAGNOSIS — E1122 Type 2 diabetes mellitus with diabetic chronic kidney disease: Secondary | ICD-10-CM | POA: Diagnosis not present

## 2018-06-04 LAB — AEROBIC/ANAEROBIC CULTURE W GRAM STAIN (SURGICAL/DEEP WOUND)

## 2018-06-04 LAB — AEROBIC/ANAEROBIC CULTURE (SURGICAL/DEEP WOUND)

## 2018-06-07 DIAGNOSIS — M810 Age-related osteoporosis without current pathological fracture: Secondary | ICD-10-CM | POA: Diagnosis not present

## 2018-06-07 DIAGNOSIS — I120 Hypertensive chronic kidney disease with stage 5 chronic kidney disease or end stage renal disease: Secondary | ICD-10-CM | POA: Diagnosis not present

## 2018-06-07 DIAGNOSIS — E46 Unspecified protein-calorie malnutrition: Secondary | ICD-10-CM | POA: Diagnosis not present

## 2018-06-07 DIAGNOSIS — J45909 Unspecified asthma, uncomplicated: Secondary | ICD-10-CM | POA: Diagnosis not present

## 2018-06-07 DIAGNOSIS — N185 Chronic kidney disease, stage 5: Secondary | ICD-10-CM | POA: Diagnosis not present

## 2018-06-07 DIAGNOSIS — E1151 Type 2 diabetes mellitus with diabetic peripheral angiopathy without gangrene: Secondary | ICD-10-CM | POA: Diagnosis not present

## 2018-06-07 DIAGNOSIS — Z7982 Long term (current) use of aspirin: Secondary | ICD-10-CM | POA: Diagnosis not present

## 2018-06-07 DIAGNOSIS — Z48815 Encounter for surgical aftercare following surgery on the digestive system: Secondary | ICD-10-CM | POA: Diagnosis not present

## 2018-06-07 DIAGNOSIS — E1122 Type 2 diabetes mellitus with diabetic chronic kidney disease: Secondary | ICD-10-CM | POA: Diagnosis not present

## 2018-06-07 DIAGNOSIS — Z9181 History of falling: Secondary | ICD-10-CM | POA: Diagnosis not present

## 2018-06-07 DIAGNOSIS — R1312 Dysphagia, oropharyngeal phase: Secondary | ICD-10-CM | POA: Diagnosis not present

## 2018-06-09 ENCOUNTER — Other Ambulatory Visit: Payer: Self-pay | Admitting: Internal Medicine

## 2018-06-09 DIAGNOSIS — E1151 Type 2 diabetes mellitus with diabetic peripheral angiopathy without gangrene: Secondary | ICD-10-CM | POA: Diagnosis not present

## 2018-06-09 DIAGNOSIS — N185 Chronic kidney disease, stage 5: Secondary | ICD-10-CM | POA: Diagnosis not present

## 2018-06-09 DIAGNOSIS — E46 Unspecified protein-calorie malnutrition: Secondary | ICD-10-CM | POA: Diagnosis not present

## 2018-06-09 DIAGNOSIS — E039 Hypothyroidism, unspecified: Secondary | ICD-10-CM | POA: Diagnosis not present

## 2018-06-09 DIAGNOSIS — Z7982 Long term (current) use of aspirin: Secondary | ICD-10-CM | POA: Diagnosis not present

## 2018-06-09 DIAGNOSIS — I120 Hypertensive chronic kidney disease with stage 5 chronic kidney disease or end stage renal disease: Secondary | ICD-10-CM | POA: Diagnosis not present

## 2018-06-09 DIAGNOSIS — J45909 Unspecified asthma, uncomplicated: Secondary | ICD-10-CM | POA: Diagnosis not present

## 2018-06-09 DIAGNOSIS — T8142XA Infection following a procedure, deep incisional surgical site, initial encounter: Secondary | ICD-10-CM | POA: Diagnosis not present

## 2018-06-09 DIAGNOSIS — R1312 Dysphagia, oropharyngeal phase: Secondary | ICD-10-CM | POA: Diagnosis not present

## 2018-06-09 DIAGNOSIS — Z9181 History of falling: Secondary | ICD-10-CM | POA: Diagnosis not present

## 2018-06-09 DIAGNOSIS — K91873 Postprocedural seroma of a digestive system organ or structure following other procedure: Secondary | ICD-10-CM | POA: Diagnosis not present

## 2018-06-09 DIAGNOSIS — M810 Age-related osteoporosis without current pathological fracture: Secondary | ICD-10-CM | POA: Diagnosis not present

## 2018-06-09 DIAGNOSIS — E1122 Type 2 diabetes mellitus with diabetic chronic kidney disease: Secondary | ICD-10-CM | POA: Diagnosis not present

## 2018-06-09 DIAGNOSIS — Z48815 Encounter for surgical aftercare following surgery on the digestive system: Secondary | ICD-10-CM | POA: Diagnosis not present

## 2018-06-09 MED ORDER — GLUCOSE BLOOD VI STRP: ORAL_STRIP | 6 refills | Status: DC

## 2018-06-09 NOTE — Telephone Encounter (Signed)
Copied from [place] [phone]. Topic: Quick Communication - Rx Refill/Question >> Jun 09, 2018 10:24 AM Selinda Flavin B, NT wrote: Medication: glucose blood (ACCU-CHEK AVIVA PLUS) test strip  Has the patient contacted their pharmacy? Yes.   (Agent: If no, request that the patient contact the pharmacy for the refill.) (Agent: If yes, when and what did the pharmacy advise?)  Preferred Pharmacy (with phone number or street name): [place], [place]  Agent: Please be advised that RX refills may take up to 3 business days. We ask that you follow-up with your pharmacy.

## 2018-06-12 ENCOUNTER — Emergency Department
Admission: EM | Admit: 2018-06-12 | Discharge: 2018-06-12 | Disposition: A | Discharge status: Home / Self Care | Payer: Medicare Other | Attending: Emergency Medicine | Admitting: Emergency Medicine

## 2018-06-12 ENCOUNTER — Encounter: Payer: Self-pay | Admitting: Emergency Medicine

## 2018-06-12 ENCOUNTER — Emergency Department: Payer: Medicare Other

## 2018-06-12 DIAGNOSIS — E7800 Pure hypercholesterolemia, unspecified: Secondary | ICD-10-CM | POA: Diagnosis not present

## 2018-06-12 DIAGNOSIS — W06XXXA Fall from bed, initial encounter: Secondary | ICD-10-CM | POA: Diagnosis not present

## 2018-06-12 DIAGNOSIS — Z7982 Long term (current) use of aspirin: Secondary | ICD-10-CM | POA: Diagnosis not present

## 2018-06-12 DIAGNOSIS — S0990XA Unspecified injury of head, initial encounter: Secondary | ICD-10-CM | POA: Insufficient documentation

## 2018-06-12 DIAGNOSIS — Y998 Other external cause status: Secondary | ICD-10-CM | POA: Insufficient documentation

## 2018-06-12 DIAGNOSIS — I120 Hypertensive chronic kidney disease with stage 5 chronic kidney disease or end stage renal disease: Secondary | ICD-10-CM | POA: Insufficient documentation

## 2018-06-12 DIAGNOSIS — Z79899 Other long term (current) drug therapy: Secondary | ICD-10-CM | POA: Diagnosis not present

## 2018-06-12 DIAGNOSIS — S8991XA Unspecified injury of right lower leg, initial encounter: Secondary | ICD-10-CM | POA: Insufficient documentation

## 2018-06-12 DIAGNOSIS — Y92003 Bedroom of unspecified non-institutional (private) residence as the place of occurrence of the external cause: Secondary | ICD-10-CM | POA: Insufficient documentation

## 2018-06-12 DIAGNOSIS — E119 Type 2 diabetes mellitus without complications: Secondary | ICD-10-CM | POA: Insufficient documentation

## 2018-06-12 DIAGNOSIS — J45909 Unspecified asthma, uncomplicated: Secondary | ICD-10-CM | POA: Insufficient documentation

## 2018-06-12 DIAGNOSIS — Z794 Long term (current) use of insulin: Secondary | ICD-10-CM | POA: Diagnosis not present

## 2018-06-12 DIAGNOSIS — M25561 Pain in right knee: Secondary | ICD-10-CM | POA: Diagnosis not present

## 2018-06-12 DIAGNOSIS — N185 Chronic kidney disease, stage 5: Secondary | ICD-10-CM | POA: Insufficient documentation

## 2018-06-12 DIAGNOSIS — R220 Localized swelling, mass and lump, head: Secondary | ICD-10-CM | POA: Diagnosis not present

## 2018-06-12 DIAGNOSIS — Y9389 Activity, other specified: Secondary | ICD-10-CM | POA: Diagnosis not present

## 2018-06-12 DIAGNOSIS — E039 Hypothyroidism, unspecified: Secondary | ICD-10-CM | POA: Diagnosis not present

## 2018-06-12 DIAGNOSIS — S0993XA Unspecified injury of face, initial encounter: Secondary | ICD-10-CM | POA: Diagnosis not present

## 2018-06-12 NOTE — ED Triage Notes (Signed)
Patient states that she was getting out of bed this morning. Patient states that she was reaching for her walker and missed the walker and fell. Patient with swelling to left cheek and complaining of pain to right knee. Patient denies LOC. Patient states that she does take anticoagulants.

## 2018-06-12 NOTE — ED Provider Notes (Signed)
[facility] Emergency Department Provider Note  ___________________________________________   None    (approximate)  I have reviewed the triage vital signs and the nursing notes.   HISTORY  Chief Complaint Fall   HPI Tara [name] is a 84 y.o. female who presents to the emergency department for evaluation and treatment after a mechanical, non-syncopal fall this morning. She was reaching for her bedside commode and missed the handle.  She lost her balance and fell.  She denies loss of consciousness but does state that she hit her face on something.  She has some swelling to the left cheek.  She also complains of pain to the right knee.   Past Medical History:  Diagnosis Date  . Asthma   . Diabetes mellitus ([place])   . Environmental allergies   . Hypertension     Patient Active Problem List   Diagnosis Date Noted  . HTN (hypertension), malignant 05/26/2018  . Wound infection after surgery 05/14/2018  . Chronic constipation 04/02/2018  . GERD without esophagitis 04/02/2018  . Hypertensive kidney disease with CKD (chronic kidney disease) stage V ([place]) 03/31/2018  . Abdominal pain 03/02/2018  . Hypertensive urgency, malignant 03/02/2018  . Difficulty breathing 11/03/2017  . Knee effusion, right 11/03/2017  . Fracture of superior rim of left pubis, subsequent encounter for fracture with routine healing 11/03/2017  . History of falling 11/03/2017  . Protein-calorie malnutrition, severe 11/01/2017  . Hypertensive urgency 10/31/2017  . Bilateral lower extremity edema 09/07/2017  . Right hand pain 08/25/2017  . Leg pain 08/25/2017  . Right groin hernia 07/03/2017  . Abnormal CT of the abdomen 07/03/2017  . Allergic rhinitis 06/17/2017  . Osteoporosis 03/12/2017  . Nasal obstruction 09/03/2016  . Loss of weight 03/05/2015  . Lower extremity edema 05/15/2013  . Peripheral vascular disease ([place]) 08/29/2012  . Hypertension 06/11/2012  .  Hypercholesteremia 06/11/2012  . Diabetes mellitus with peripheral vascular disease ([place]) 06/11/2012  . Anemia 06/11/2012  . B12 deficiency 06/11/2012  . Hypothyroidism 06/11/2012    Past Surgical History:  Procedure Laterality Date  . APPENDECTOMY    . APPLICATION OF WOUND VAC Right 05/14/2018   Procedure: APPLICATION OF WOUND VAC;  Surgeon: Benjamine Sprague, DO;  Location: [facility] ORS;  Service: General;  Laterality: Right;  . INCISION AND DRAINAGE ABSCESS Right 05/14/2018   Procedure: INCISION AND DRAINAGE ABSCESS RIGHT GROIN;  Surgeon: Benjamine Sprague, DO;  Location: [facility] ORS;  Service: General;  Laterality: Right;  . INGUINAL HERNIA REPAIR  03/09/2018   Procedure: HERNIA REPAIR INGUINAL INCARCERATED;  Surgeon: Benjamine Sprague, DO;  Location: [facility] ORS;  Service: General;;  . INSERTION OF MESH  03/09/2018   Procedure: INSERTION OF MESH;  Surgeon: Benjamine Sprague, DO;  Location: [facility] ORS;  Service: General;;  . LAPAROSCOPY N/A 03/09/2018   Procedure: LAPAROSCOPY DIAGNOSTIC;  Surgeon: Benjamine Sprague, DO;  Location: [facility] ORS;  Service: General;  Laterality: N/A;  . TUBAL LIGATION      Prior to Admission medications   Medication Sig Start Date End Date Taking? Authorizing Provider  acetaminophen (TYLENOL) 325 MG tablet Take 650 mg by mouth every 6 (six) hours as needed for mild pain.     [provider]  Amino Acids-Protein Hydrolys (FEEDING SUPPLEMENT, PRO-STAT SUGAR FREE 64,) LIQD Take 30 mLs by mouth 2 (two) times daily between meals.    [provider]  amLODipine (NORVASC) 10 MG tablet Take 1 tablet (10 mg total) by mouth daily. 05/28/18   Gladstone Lighter,  MD  aspirin (ASPIRIN 81) 81 MG EC tablet Take 81 mg by mouth daily. Swallow whole.    [provider]  Calcium Carbonate-Vitamin D (CALTRATE 600+D PO) Take 1 tablet by mouth daily.     [provider]  diclofenac sodium (VOLTAREN) 1 % GEL Apply 4 g topically 3 (three) times daily as needed. Apply to the Right Knee  for effusion 12/23/17   Einar Pheasant, MD  famotidine (PEPCID) 10 MG tablet Take 10 mg by mouth 2 (two) times daily.    [provider]  feeding supplement, ENSURE ENLIVE, (ENSURE ENLIVE) LIQD Take 237 mLs by mouth 2 (two) times daily between meals. 03/15/18   Mayo, Pete Pelt, MD  fexofenadine (ALLEGRA) 180 MG tablet Take 1 tablet (180 mg total) by mouth daily. 02/10/18   Einar Pheasant, MD  glucose blood (ACCU-CHEK AVIVA PLUS) test strip CHECK SUGAR ONCE DAILY 06/09/18   Einar Pheasant, MD  hydrALAZINE (APRESOLINE) 100 MG tablet Take 1 tablet (100 mg total) by mouth 3 (three) times daily. 05/27/18   Gladstone Lighter, MD  Insulin Glargine (LANTUS SOLOSTAR) 100 UNIT/ML Solostar Pen INJECT 6 UNITS UNDER THE SKIN ONCE DAILY 05/28/18   Einar Pheasant, MD  Insulin Pen Needle (PEN NEEDLES) 32G X 4 MM MISC 1 application by Does not apply route daily. 05/28/18   Einar Pheasant, MD  labetalol (NORMODYNE) 100 MG tablet Take 1 tablet (100 mg total) by mouth 2 (two) times daily. 05/27/18   Gladstone Lighter, MD  multivitamin-lutein [facility]) CAPS capsule Take 1 capsule by mouth daily. 05/20/18 06/19/18  Lysle Pearl, Isami, DO  polyethylene glycol (MIRALAX / GLYCOLAX) packet Take 17 g by mouth daily. 03/16/18   Mayo, Pete Pelt, MD  potassium chloride (K-DUR) 10 MEQ tablet Take 10 mEq by mouth daily.    [provider]  senna-docusate (SENOKOT-S) 8.6-50 MG tablet Take 2 tablets by mouth 2 (two) times daily. 03/15/18   Mayo, Pete Pelt, MD  SYNTHROID 50 MCG tablet Take 1 tablet (50 mcg total) by mouth daily. 05/19/18 06/18/18  Benjamine Sprague, DO    Allergies Ace inhibitors; Hydrochlorothiazide w-triamterene; Lisinopril; Penicillins; and Trichlormethiazide  Family History  Problem Relation Age of Onset  . Hypertension Mother   . Cancer Brother        lung (question)  . Cancer Sister        questionable type    Social History Social History   Tobacco Use  . Smoking status: Never Smoker    . Smokeless tobacco: Never Used  Substance Use Topics  . Alcohol use: No    Alcohol/week: 0.0 standard drinks  . Drug use: No    Review of Systems  Constitutional: No fever/chills Eyes: No visual changes. ENT: No sore throat. Cardiovascular: Denies chest pain. Respiratory: Denies shortness of breath. Gastrointestinal: No abdominal pain.  No nausea, no vomiting.   Genitourinary: Negative for dysuria. Musculoskeletal: Negative for back pain. Skin: Negative for rash, open wound, or lesions on exposed skin surfaces. Neurological: Negative for headaches, focal weakness or numbness. ____________________________________________   PHYSICAL EXAM:  VITAL SIGNS: ED Triage Vitals  Enc Vitals Group     BP 06/12/18 1929 (!) 190/76     Pulse Rate 06/12/18 1929 75     Resp 06/12/18 1929 18     Temp 06/12/18 1929 98.2 F (36.8 C)     Temp Source 06/12/18 1929 Oral     SpO2 06/12/18 1929 99 %     Weight 06/12/18 1926 88 lb (39.9 kg)  Height 06/12/18 1926 4\' 10"  (1.473 m)     Head Circumference --      Peak Flow --      Pain Score 06/12/18 1926 8     Pain Loc --      Pain Edu? --      Excl. in [place]? --     Constitutional: Alert and oriented. Well appearing and in no acute distress. Eyes: Conjunctivae are normal. Head: Atraumatic.  Small contusion to the left cheek Nose: No congestion/rhinnorhea. Mouth/Throat: Mucous membranes are moist.  Oropharynx non-erythematous. Neck: No stridor.  No cervical tenderness on palpation.  Observed actively moving without complaint of pain Cardiovascular: Normal rate, regular rhythm. Grossly normal heart sounds.  Good peripheral circulation. Respiratory: Normal respiratory effort.  No retractions. Lungs CTAB. Gastrointestinal: Soft and nontender. No distention. No abdominal bruits. No CVA tenderness. Musculoskeletal: Range of motion of the right knee observed.  No joint effusion or obvious deformity noted to the right knee.  No shortening or  rotation of the right lower extremity.  No hip pain on palpation. Neurologic:  Normal speech and language. No gross focal neurologic deficits are appreciated. No gait instability. Skin:  Skin is warm, dry and intact. No rash noted. Psychiatric: Mood and affect are normal. Speech and behavior are normal.  ____________________________________________   LABS (all labs ordered are listed, but only abnormal results are displayed)  Labs Reviewed - No data to display ____________________________________________  EKG  Not indicated ____________________________________________  RADIOLOGY  ED MD interpretation: No acute abnormality of the right knee.  CT of the head is negative for acute concerns.  Maxillofacial CT is also negative for acute bony abnormality.  Official radiology report(s): Ct Head Wo Contrast  Result Date: 06/12/2018 CLINICAL DATA:  Swelling to left cheek and pain two right knee. Denies loss of consciousness. EXAM: CT HEAD WITHOUT CONTRAST CT MAXILLOFACIAL WITHOUT CONTRAST TECHNIQUE: Multidetector CT imaging of the head and maxillofacial structures were performed using the standard protocol without intravenous contrast. Multiplanar CT image reconstructions of the maxillofacial structures were also generated. COMPARISON:  CT head 07/08/2014 FINDINGS: CT HEAD FINDINGS Brain: No evidence of acute infarction, hemorrhage, hydrocephalus, extra-axial collection or mass lesion/mass effect. There is mild diffuse low-attenuation within the subcortical and periventricular white matter compatible with chronic microvascular disease. Prominence of the sulci and ventricles noted. Vascular: No hyperdense vessel or unexpected calcification. Skull: Normal. Negative for fracture or focal lesion. Other: None. CT MAXILLOFACIAL FINDINGS Osseous: No fracture or mandibular dislocation. No destructive process. Orbits: Negative. No traumatic or inflammatory finding. Sinuses: Clear. Soft tissues: Hematoma along  the left side of face is identified, image 38/6. IMPRESSION: 1. No acute intracranial abnormalities. 2. Chronic small vessel ischemic disease and brain atrophy. 3. No evidence for facial bone fracture. 4. Electronically Signed   By: Kerby Moors M.D.   On: 06/12/2018 20:10   Dg Knee Complete 4 Views Right  Result Date: 06/12/2018 CLINICAL DATA:  Right knee pain after fall. EXAM: RIGHT KNEE - COMPLETE 4+ VIEW COMPARISON:  CT right knee dated November 01, 2017. Right knee x-rays dated October 30, 2017. FINDINGS: No acute fracture or dislocation. Moderate to large joint effusion. Mild progressive lateral compartment degenerative changes. Osteopenia. Vascular calcifications. IMPRESSION: 1. No acute osseous abnormality. Persistent moderate to large joint effusion. 2. Mildly progressive lateral compartment degenerative changes. Electronically Signed   By: Titus Dubin M.D.   On: 06/12/2018 20:19   Ct Maxillofacial Wo Contrast  Result Date: 06/12/2018 CLINICAL DATA:  Swelling to  left cheek and pain two right knee. Denies loss of consciousness. EXAM: CT HEAD WITHOUT CONTRAST CT MAXILLOFACIAL WITHOUT CONTRAST TECHNIQUE: Multidetector CT imaging of the head and maxillofacial structures were performed using the standard protocol without intravenous contrast. Multiplanar CT image reconstructions of the maxillofacial structures were also generated. COMPARISON:  CT head 07/08/2014 FINDINGS: CT HEAD FINDINGS Brain: No evidence of acute infarction, hemorrhage, hydrocephalus, extra-axial collection or mass lesion/mass effect. There is mild diffuse low-attenuation within the subcortical and periventricular white matter compatible with chronic microvascular disease. Prominence of the sulci and ventricles noted. Vascular: No hyperdense vessel or unexpected calcification. Skull: Normal. Negative for fracture or focal lesion. Other: None. CT MAXILLOFACIAL FINDINGS Osseous: No fracture or mandibular dislocation. No destructive  process. Orbits: Negative. No traumatic or inflammatory finding. Sinuses: Clear. Soft tissues: Hematoma along the left side of face is identified, image 38/6. IMPRESSION: 1. No acute intracranial abnormalities. 2. Chronic small vessel ischemic disease and brain atrophy. 3. No evidence for facial bone fracture. 4. Electronically Signed   By: Kerby Moors M.D.   On: 06/12/2018 20:10    ____________________________________________   PROCEDURES  Procedure(s) performed: None  Procedures  Critical Care performed: No  ____________________________________________   INITIAL IMPRESSION / ASSESSMENT AND PLAN / ED COURSE  As part of my medical decision making, I reviewed the following data within the electronic MEDICAL RECORD NUMBER   84 year old female presenting to the emergency department status post mechanical, non-syncopal fall earlier today.  Her family states that she has been able to ambulate with assistance today which is at her baseline.  Images of the right knee, face, and head are all very reassuring.  The patient has Voltaren gel that she can use over the knee if needed.  Family were advised to give her Tylenol if needed for pain.  They were encouraged to follow-up with the primary care provider for symptoms of concern.  If unable to do so and they have concerns they are to return with her to the emergency department.      ____________________________________________   FINAL CLINICAL IMPRESSION(S) / ED DIAGNOSES  Final diagnoses:  Minor head injury, initial encounter  Facial injury, initial encounter  Knee injury, right, initial encounter     ED Discharge Orders    None       Note:  This document was prepared using Dragon voice recognition software and may include unintentional dictation errors.    Victorino Dike, FNP 06/13/18 [place], [place], MD 06/15/18 [phone]

## 2018-06-12 NOTE — Discharge Instructions (Signed)
Tylenol every 6 hours if needed for pain.  Follow up with primary care for symptoms of concern. If unable to schedule an appointment, return to the ER.

## 2018-06-12 NOTE — ED Notes (Signed)
E-signature pad not functional. Patient left with family who understood discharge instructions by asking appropriate questions.

## 2018-06-14 DIAGNOSIS — J45909 Unspecified asthma, uncomplicated: Secondary | ICD-10-CM | POA: Diagnosis not present

## 2018-06-14 DIAGNOSIS — K91873 Postprocedural seroma of a digestive system organ or structure following other procedure: Secondary | ICD-10-CM | POA: Diagnosis not present

## 2018-06-14 DIAGNOSIS — R1312 Dysphagia, oropharyngeal phase: Secondary | ICD-10-CM | POA: Diagnosis not present

## 2018-06-14 DIAGNOSIS — E1122 Type 2 diabetes mellitus with diabetic chronic kidney disease: Secondary | ICD-10-CM | POA: Diagnosis not present

## 2018-06-14 DIAGNOSIS — M810 Age-related osteoporosis without current pathological fracture: Secondary | ICD-10-CM | POA: Diagnosis not present

## 2018-06-14 DIAGNOSIS — Z7982 Long term (current) use of aspirin: Secondary | ICD-10-CM | POA: Diagnosis not present

## 2018-06-14 DIAGNOSIS — T8142XA Infection following a procedure, deep incisional surgical site, initial encounter: Secondary | ICD-10-CM | POA: Diagnosis not present

## 2018-06-14 DIAGNOSIS — E46 Unspecified protein-calorie malnutrition: Secondary | ICD-10-CM | POA: Diagnosis not present

## 2018-06-14 DIAGNOSIS — N185 Chronic kidney disease, stage 5: Secondary | ICD-10-CM | POA: Diagnosis not present

## 2018-06-14 DIAGNOSIS — I120 Hypertensive chronic kidney disease with stage 5 chronic kidney disease or end stage renal disease: Secondary | ICD-10-CM | POA: Diagnosis not present

## 2018-06-14 DIAGNOSIS — E1151 Type 2 diabetes mellitus with diabetic peripheral angiopathy without gangrene: Secondary | ICD-10-CM | POA: Diagnosis not present

## 2018-06-14 DIAGNOSIS — Z9181 History of falling: Secondary | ICD-10-CM | POA: Diagnosis not present

## 2018-06-14 DIAGNOSIS — E039 Hypothyroidism, unspecified: Secondary | ICD-10-CM | POA: Diagnosis not present

## 2018-06-16 ENCOUNTER — Ambulatory Visit (INDEPENDENT_AMBULATORY_CARE_PROVIDER_SITE_OTHER): Payer: Medicare Other | Admitting: *Deleted

## 2018-06-16 DIAGNOSIS — E538 Deficiency of other specified B group vitamins: Secondary | ICD-10-CM | POA: Diagnosis not present

## 2018-06-16 MED ORDER — CYANOCOBALAMIN 1000 MCG/ML IJ SOLN
1000.0000 ug | Freq: Once | INTRAMUSCULAR | Status: AC
  Administered 2018-06-16: 1000 ug via INTRAMUSCULAR

## 2018-06-16 NOTE — Progress Notes (Signed)
Patient presented for B 12 injection to left deltoid, patient voiced no concerns nor showed any signs of distress during injection. 

## 2018-06-18 DIAGNOSIS — E46 Unspecified protein-calorie malnutrition: Secondary | ICD-10-CM | POA: Diagnosis not present

## 2018-06-18 DIAGNOSIS — E039 Hypothyroidism, unspecified: Secondary | ICD-10-CM | POA: Diagnosis not present

## 2018-06-18 DIAGNOSIS — K91873 Postprocedural seroma of a digestive system organ or structure following other procedure: Secondary | ICD-10-CM | POA: Diagnosis not present

## 2018-06-18 DIAGNOSIS — I120 Hypertensive chronic kidney disease with stage 5 chronic kidney disease or end stage renal disease: Secondary | ICD-10-CM | POA: Diagnosis not present

## 2018-06-18 DIAGNOSIS — T8142XA Infection following a procedure, deep incisional surgical site, initial encounter: Secondary | ICD-10-CM | POA: Diagnosis not present

## 2018-06-18 DIAGNOSIS — E1122 Type 2 diabetes mellitus with diabetic chronic kidney disease: Secondary | ICD-10-CM | POA: Diagnosis not present

## 2018-06-18 DIAGNOSIS — Z7982 Long term (current) use of aspirin: Secondary | ICD-10-CM | POA: Diagnosis not present

## 2018-06-18 DIAGNOSIS — N185 Chronic kidney disease, stage 5: Secondary | ICD-10-CM | POA: Diagnosis not present

## 2018-06-18 DIAGNOSIS — R1312 Dysphagia, oropharyngeal phase: Secondary | ICD-10-CM | POA: Diagnosis not present

## 2018-06-18 DIAGNOSIS — Z9181 History of falling: Secondary | ICD-10-CM | POA: Diagnosis not present

## 2018-06-18 DIAGNOSIS — J45909 Unspecified asthma, uncomplicated: Secondary | ICD-10-CM | POA: Diagnosis not present

## 2018-06-18 DIAGNOSIS — M810 Age-related osteoporosis without current pathological fracture: Secondary | ICD-10-CM | POA: Diagnosis not present

## 2018-06-18 DIAGNOSIS — E1151 Type 2 diabetes mellitus with diabetic peripheral angiopathy without gangrene: Secondary | ICD-10-CM | POA: Diagnosis not present

## 2018-06-21 DIAGNOSIS — Z9181 History of falling: Secondary | ICD-10-CM | POA: Diagnosis not present

## 2018-06-21 DIAGNOSIS — R1312 Dysphagia, oropharyngeal phase: Secondary | ICD-10-CM | POA: Diagnosis not present

## 2018-06-21 DIAGNOSIS — K91873 Postprocedural seroma of a digestive system organ or structure following other procedure: Secondary | ICD-10-CM | POA: Diagnosis not present

## 2018-06-21 DIAGNOSIS — M810 Age-related osteoporosis without current pathological fracture: Secondary | ICD-10-CM | POA: Diagnosis not present

## 2018-06-21 DIAGNOSIS — E039 Hypothyroidism, unspecified: Secondary | ICD-10-CM | POA: Diagnosis not present

## 2018-06-21 DIAGNOSIS — I120 Hypertensive chronic kidney disease with stage 5 chronic kidney disease or end stage renal disease: Secondary | ICD-10-CM | POA: Diagnosis not present

## 2018-06-21 DIAGNOSIS — E1122 Type 2 diabetes mellitus with diabetic chronic kidney disease: Secondary | ICD-10-CM | POA: Diagnosis not present

## 2018-06-21 DIAGNOSIS — Z7982 Long term (current) use of aspirin: Secondary | ICD-10-CM | POA: Diagnosis not present

## 2018-06-21 DIAGNOSIS — J45909 Unspecified asthma, uncomplicated: Secondary | ICD-10-CM | POA: Diagnosis not present

## 2018-06-21 DIAGNOSIS — T8142XA Infection following a procedure, deep incisional surgical site, initial encounter: Secondary | ICD-10-CM | POA: Diagnosis not present

## 2018-06-21 DIAGNOSIS — E46 Unspecified protein-calorie malnutrition: Secondary | ICD-10-CM | POA: Diagnosis not present

## 2018-06-21 DIAGNOSIS — N185 Chronic kidney disease, stage 5: Secondary | ICD-10-CM | POA: Diagnosis not present

## 2018-06-21 DIAGNOSIS — E1151 Type 2 diabetes mellitus with diabetic peripheral angiopathy without gangrene: Secondary | ICD-10-CM | POA: Diagnosis not present

## 2018-06-23 DIAGNOSIS — E119 Type 2 diabetes mellitus without complications: Secondary | ICD-10-CM | POA: Diagnosis not present

## 2018-06-23 LAB — HM DIABETES EYE EXAM

## 2018-06-24 DIAGNOSIS — J45909 Unspecified asthma, uncomplicated: Secondary | ICD-10-CM | POA: Diagnosis not present

## 2018-06-24 DIAGNOSIS — Z7982 Long term (current) use of aspirin: Secondary | ICD-10-CM | POA: Diagnosis not present

## 2018-06-24 DIAGNOSIS — E46 Unspecified protein-calorie malnutrition: Secondary | ICD-10-CM | POA: Diagnosis not present

## 2018-06-24 DIAGNOSIS — E1122 Type 2 diabetes mellitus with diabetic chronic kidney disease: Secondary | ICD-10-CM | POA: Diagnosis not present

## 2018-06-24 DIAGNOSIS — Z9181 History of falling: Secondary | ICD-10-CM | POA: Diagnosis not present

## 2018-06-24 DIAGNOSIS — I120 Hypertensive chronic kidney disease with stage 5 chronic kidney disease or end stage renal disease: Secondary | ICD-10-CM | POA: Diagnosis not present

## 2018-06-24 DIAGNOSIS — T8142XA Infection following a procedure, deep incisional surgical site, initial encounter: Secondary | ICD-10-CM | POA: Diagnosis not present

## 2018-06-24 DIAGNOSIS — E039 Hypothyroidism, unspecified: Secondary | ICD-10-CM | POA: Diagnosis not present

## 2018-06-24 DIAGNOSIS — E1151 Type 2 diabetes mellitus with diabetic peripheral angiopathy without gangrene: Secondary | ICD-10-CM | POA: Diagnosis not present

## 2018-06-24 DIAGNOSIS — K91873 Postprocedural seroma of a digestive system organ or structure following other procedure: Secondary | ICD-10-CM | POA: Diagnosis not present

## 2018-06-24 DIAGNOSIS — R1312 Dysphagia, oropharyngeal phase: Secondary | ICD-10-CM | POA: Diagnosis not present

## 2018-06-24 DIAGNOSIS — M810 Age-related osteoporosis without current pathological fracture: Secondary | ICD-10-CM | POA: Diagnosis not present

## 2018-06-24 DIAGNOSIS — N185 Chronic kidney disease, stage 5: Secondary | ICD-10-CM | POA: Diagnosis not present

## 2018-06-28 ENCOUNTER — Telehealth: Payer: Self-pay | Admitting: Internal Medicine

## 2018-06-28 NOTE — Telephone Encounter (Signed)
Copied from [place] [phone]. Topic: Quick Communication - Rx Refill/Question >> Jun 28, 2018 12:50 PM Waylan Rocher, [place] L wrote: Medication: Patient's daughter [name], calling to find out which medications the patients needs and which have been discontinued. Patient is out of all of her medications.   Has the patient contacted their pharmacy? Yes.   (Agent: If no, request that the patient contact the pharmacy for the refill.) (Agent: If yes, when and what did the pharmacy advise?)  Preferred Pharmacy (with phone number or street name): [place], [place] - [place] [address] [place] [address] Phone: [phone] Fax: [phone]  Agent: Please be advised that RX refills may take up to 3 business days. We ask that you follow-up with your pharmacy.

## 2018-06-29 DIAGNOSIS — T8142XA Infection following a procedure, deep incisional surgical site, initial encounter: Secondary | ICD-10-CM | POA: Diagnosis not present

## 2018-06-29 DIAGNOSIS — M810 Age-related osteoporosis without current pathological fracture: Secondary | ICD-10-CM | POA: Diagnosis not present

## 2018-06-29 DIAGNOSIS — E46 Unspecified protein-calorie malnutrition: Secondary | ICD-10-CM | POA: Diagnosis not present

## 2018-06-29 DIAGNOSIS — Z7982 Long term (current) use of aspirin: Secondary | ICD-10-CM | POA: Diagnosis not present

## 2018-06-29 DIAGNOSIS — E1151 Type 2 diabetes mellitus with diabetic peripheral angiopathy without gangrene: Secondary | ICD-10-CM | POA: Diagnosis not present

## 2018-06-29 DIAGNOSIS — I120 Hypertensive chronic kidney disease with stage 5 chronic kidney disease or end stage renal disease: Secondary | ICD-10-CM | POA: Diagnosis not present

## 2018-06-29 DIAGNOSIS — N185 Chronic kidney disease, stage 5: Secondary | ICD-10-CM | POA: Diagnosis not present

## 2018-06-29 DIAGNOSIS — K91873 Postprocedural seroma of a digestive system organ or structure following other procedure: Secondary | ICD-10-CM | POA: Diagnosis not present

## 2018-06-29 DIAGNOSIS — J45909 Unspecified asthma, uncomplicated: Secondary | ICD-10-CM | POA: Diagnosis not present

## 2018-06-29 DIAGNOSIS — Z9181 History of falling: Secondary | ICD-10-CM | POA: Diagnosis not present

## 2018-06-29 DIAGNOSIS — E1122 Type 2 diabetes mellitus with diabetic chronic kidney disease: Secondary | ICD-10-CM | POA: Diagnosis not present

## 2018-06-29 DIAGNOSIS — R1312 Dysphagia, oropharyngeal phase: Secondary | ICD-10-CM | POA: Diagnosis not present

## 2018-06-29 DIAGNOSIS — E039 Hypothyroidism, unspecified: Secondary | ICD-10-CM | POA: Diagnosis not present

## 2018-06-29 NOTE — Telephone Encounter (Signed)
Attempted to call patient's daughter to review medication list ( discharge list)- left message to call back to review medication list.

## 2018-06-29 NOTE — Telephone Encounter (Signed)
Pt daughter called No DPR. Daughter will call back when she retruns home to her mother house.

## 2018-06-29 NOTE — Telephone Encounter (Signed)
Patient's home number called and received permission to speak to patient's daughter [name]. [name] wants to know what medications Dr. Nicki Reaper wants the patient to be taking. I advised someone from the office will call with Dr. Bary Leriche recommendations, she verbalized understanding and asks to be called on her cell number: [place].

## 2018-06-29 NOTE — Telephone Encounter (Signed)
Tara [name] called back.  All nurses on the other line.  She was told to call back when she got to mother home  [place]

## 2018-06-30 NOTE — Telephone Encounter (Signed)
FYI

## 2018-07-01 DIAGNOSIS — J45909 Unspecified asthma, uncomplicated: Secondary | ICD-10-CM | POA: Diagnosis not present

## 2018-07-01 DIAGNOSIS — Z9181 History of falling: Secondary | ICD-10-CM | POA: Diagnosis not present

## 2018-07-01 DIAGNOSIS — I120 Hypertensive chronic kidney disease with stage 5 chronic kidney disease or end stage renal disease: Secondary | ICD-10-CM | POA: Diagnosis not present

## 2018-07-01 DIAGNOSIS — E039 Hypothyroidism, unspecified: Secondary | ICD-10-CM | POA: Diagnosis not present

## 2018-07-01 DIAGNOSIS — K91873 Postprocedural seroma of a digestive system organ or structure following other procedure: Secondary | ICD-10-CM | POA: Diagnosis not present

## 2018-07-01 DIAGNOSIS — Z7982 Long term (current) use of aspirin: Secondary | ICD-10-CM | POA: Diagnosis not present

## 2018-07-01 DIAGNOSIS — N185 Chronic kidney disease, stage 5: Secondary | ICD-10-CM | POA: Diagnosis not present

## 2018-07-01 DIAGNOSIS — T8142XA Infection following a procedure, deep incisional surgical site, initial encounter: Secondary | ICD-10-CM | POA: Diagnosis not present

## 2018-07-01 DIAGNOSIS — R1312 Dysphagia, oropharyngeal phase: Secondary | ICD-10-CM | POA: Diagnosis not present

## 2018-07-01 DIAGNOSIS — E1151 Type 2 diabetes mellitus with diabetic peripheral angiopathy without gangrene: Secondary | ICD-10-CM | POA: Diagnosis not present

## 2018-07-01 DIAGNOSIS — E46 Unspecified protein-calorie malnutrition: Secondary | ICD-10-CM | POA: Diagnosis not present

## 2018-07-01 DIAGNOSIS — M810 Age-related osteoporosis without current pathological fracture: Secondary | ICD-10-CM | POA: Diagnosis not present

## 2018-07-01 DIAGNOSIS — E1122 Type 2 diabetes mellitus with diabetic chronic kidney disease: Secondary | ICD-10-CM | POA: Diagnosis not present

## 2018-07-02 NOTE — Telephone Encounter (Signed)
Patients daughter came in to go over med list and see what she needs to be taking. States that Dr. Lysle Pearl stopped all of her medication. Advised daughter that I would give them a call to follow up on what she was to stop and when to restart. Spoke with Dr Ines Bloomer office, she was released and was told to restart her regular medications. Patient has been off of her medication for over a week. Daughter says that her blood sugars have been running around 102 and her blood pressure was around 140/80 last time it was checked.

## 2018-07-02 NOTE — Telephone Encounter (Signed)
Spoke with daughter and gave her these medications that patient should be taking:  Amlodipine 10 mg q day Aspirin 81 mg q day Calcium-Vitamin D q day Pepcid 10 mg BID Allegra 180 mg q day Hydralazine 100 TID Labetalol 100 BID Synthroid 32mcg q day  Advised daughter to monitor blood sugars. We are going to hold insulin for now

## 2018-07-02 NOTE — Telephone Encounter (Signed)
See med list for updated medication.

## 2018-07-06 DIAGNOSIS — N185 Chronic kidney disease, stage 5: Secondary | ICD-10-CM | POA: Diagnosis not present

## 2018-07-06 DIAGNOSIS — I120 Hypertensive chronic kidney disease with stage 5 chronic kidney disease or end stage renal disease: Secondary | ICD-10-CM | POA: Diagnosis not present

## 2018-07-06 DIAGNOSIS — T8142XA Infection following a procedure, deep incisional surgical site, initial encounter: Secondary | ICD-10-CM | POA: Diagnosis not present

## 2018-07-06 DIAGNOSIS — Z7982 Long term (current) use of aspirin: Secondary | ICD-10-CM | POA: Diagnosis not present

## 2018-07-06 DIAGNOSIS — J45909 Unspecified asthma, uncomplicated: Secondary | ICD-10-CM | POA: Diagnosis not present

## 2018-07-06 DIAGNOSIS — Z9181 History of falling: Secondary | ICD-10-CM | POA: Diagnosis not present

## 2018-07-06 DIAGNOSIS — E1122 Type 2 diabetes mellitus with diabetic chronic kidney disease: Secondary | ICD-10-CM | POA: Diagnosis not present

## 2018-07-06 DIAGNOSIS — E46 Unspecified protein-calorie malnutrition: Secondary | ICD-10-CM | POA: Diagnosis not present

## 2018-07-06 DIAGNOSIS — M810 Age-related osteoporosis without current pathological fracture: Secondary | ICD-10-CM | POA: Diagnosis not present

## 2018-07-06 DIAGNOSIS — K91873 Postprocedural seroma of a digestive system organ or structure following other procedure: Secondary | ICD-10-CM | POA: Diagnosis not present

## 2018-07-06 DIAGNOSIS — E039 Hypothyroidism, unspecified: Secondary | ICD-10-CM | POA: Diagnosis not present

## 2018-07-06 DIAGNOSIS — E1151 Type 2 diabetes mellitus with diabetic peripheral angiopathy without gangrene: Secondary | ICD-10-CM | POA: Diagnosis not present

## 2018-07-06 DIAGNOSIS — R1312 Dysphagia, oropharyngeal phase: Secondary | ICD-10-CM | POA: Diagnosis not present

## 2018-07-12 DIAGNOSIS — S31109D Unspecified open wound of abdominal wall, unspecified quadrant without penetration into peritoneal cavity, subsequent encounter: Secondary | ICD-10-CM | POA: Diagnosis not present

## 2018-07-14 DIAGNOSIS — E1151 Type 2 diabetes mellitus with diabetic peripheral angiopathy without gangrene: Secondary | ICD-10-CM | POA: Diagnosis not present

## 2018-07-14 DIAGNOSIS — Z7982 Long term (current) use of aspirin: Secondary | ICD-10-CM | POA: Diagnosis not present

## 2018-07-14 DIAGNOSIS — I120 Hypertensive chronic kidney disease with stage 5 chronic kidney disease or end stage renal disease: Secondary | ICD-10-CM | POA: Diagnosis not present

## 2018-07-14 DIAGNOSIS — K91873 Postprocedural seroma of a digestive system organ or structure following other procedure: Secondary | ICD-10-CM | POA: Diagnosis not present

## 2018-07-14 DIAGNOSIS — E46 Unspecified protein-calorie malnutrition: Secondary | ICD-10-CM | POA: Diagnosis not present

## 2018-07-14 DIAGNOSIS — N185 Chronic kidney disease, stage 5: Secondary | ICD-10-CM | POA: Diagnosis not present

## 2018-07-14 DIAGNOSIS — E1122 Type 2 diabetes mellitus with diabetic chronic kidney disease: Secondary | ICD-10-CM | POA: Diagnosis not present

## 2018-07-14 DIAGNOSIS — J45909 Unspecified asthma, uncomplicated: Secondary | ICD-10-CM | POA: Diagnosis not present

## 2018-07-14 DIAGNOSIS — M810 Age-related osteoporosis without current pathological fracture: Secondary | ICD-10-CM | POA: Diagnosis not present

## 2018-07-14 DIAGNOSIS — T8142XA Infection following a procedure, deep incisional surgical site, initial encounter: Secondary | ICD-10-CM | POA: Diagnosis not present

## 2018-07-14 DIAGNOSIS — Z9181 History of falling: Secondary | ICD-10-CM | POA: Diagnosis not present

## 2018-07-14 DIAGNOSIS — E039 Hypothyroidism, unspecified: Secondary | ICD-10-CM | POA: Diagnosis not present

## 2018-07-14 DIAGNOSIS — R1312 Dysphagia, oropharyngeal phase: Secondary | ICD-10-CM | POA: Diagnosis not present

## 2018-07-19 ENCOUNTER — Other Ambulatory Visit: Payer: Self-pay | Admitting: Internal Medicine

## 2018-07-19 ENCOUNTER — Ambulatory Visit: Payer: Self-pay | Admitting: Internal Medicine

## 2018-07-19 NOTE — Telephone Encounter (Signed)
Copied from [place] [id number]. Topic: Quick Communication - Rx Refill/Question >> Jul 19, 2018  1:21 PM Scherrie Gerlach wrote: Medication: SYNTHROID 50 MCG tablet  Pt called the pharmacy and they advised to call the office.  Pt is completely out.  [place], [place] - [address] [phone] (Phone) [phone] (Fax)

## 2018-07-21 ENCOUNTER — Other Ambulatory Visit: Payer: Self-pay

## 2018-07-21 MED ORDER — SYNTHROID 50 MCG PO TABS: 50.0000 ug | ORAL_TABLET | Freq: Every day | ORAL | 0 refills | Status: DC

## 2018-07-21 NOTE — Telephone Encounter (Signed)
Already complete

## 2018-07-21 NOTE — Telephone Encounter (Signed)
Ia this ok to refill? Was last filled by Benjamine Sprague, DO?

## 2018-07-21 NOTE — Telephone Encounter (Signed)
I have sent to patient;'s pharmacy.  

## 2018-07-21 NOTE — Telephone Encounter (Signed)
Requested medication (s) are due for refill today - yes   Requested medication (s) are on the active medication list -yes  Future visit scheduled -yes  Last refill: at hospital  Notes to clinic: It looks like the patient had this medication filled by surgeon at hospital- and his name was on the Rx and this Rx was inadvertently refused. Please refill with Dr Bary Leriche name.  Requested Prescriptions  Refused Prescriptions Disp Refills   SYNTHROID 50 MCG tablet 30 tablet 0    Sig: Take 1 tablet (50 mcg total) by mouth daily.     Endocrinology:  Hypothyroid Agents Failed - 07/20/2018 12:43 PM      Failed - TSH needs to be rechecked within 3 months after an abnormal result. Refill until TSH is due.      Passed - TSH in normal range and within 360 days    TSH  Date Value Ref Range Status  10/31/2017 1.765 0.350 - 4.500 uIU/mL Final    Comment:    Performed by a 3rd Generation assay with a functional sensitivity of <=0.01 uIU/mL. Performed at [facility], [address] [place], [place] [address]   08/10/2017 4.72 (H) 0.40 - 4.50 mIU/L Final         Passed - Valid encounter within last 12 months    Recent Outpatient Visits          1 month ago Diabetes mellitus with peripheral vascular disease ([place])   [place], [place], MD   7 months ago Anemia, unspecified type   [facility] [place], [place] Patient, MD   9 months ago Urinary frequency   [place], [place] Patient, MD   11 months ago Finger pain, right   [facility] Einar Pheasant, MD   11 months ago Essential hypertension   [place], MD      Future Appointments            In 1 week Einar Pheasant, MD [place], [facility]            Requested Prescriptions  Refused Prescriptions Disp Refills   SYNTHROID 50 MCG tablet 30 tablet 0    Sig: Take 1 tablet (50 mcg total) by  mouth daily.     Endocrinology:  Hypothyroid Agents Failed - 07/20/2018 12:43 PM      Failed - TSH needs to be rechecked within 3 months after an abnormal result. Refill until TSH is due.      Passed - TSH in normal range and within 360 days    TSH  Date Value Ref Range Status  10/31/2017 1.765 0.350 - 4.500 uIU/mL Final    Comment:    Performed by a 3rd Generation assay with a functional sensitivity of <=0.01 uIU/mL. Performed at [facility], [address] [place], [place] [address]   08/10/2017 4.72 (H) 0.40 - 4.50 mIU/L Final         Passed - Valid encounter within last 12 months    Recent Outpatient Visits          1 month ago Diabetes mellitus with peripheral vascular disease ([place])   [facility] [place], [place], MD   7 months ago Anemia, unspecified type   [facility], MD   9 months ago Urinary frequency   [facility] [place], Rand[place] Patient, MD   11 months ago Finger pain, right  [facility] [place], Rand[place] Patient, MD   11 months ago Essential hypertension   [place], MD      Future Appointments            In 1 week Einar Pheasant, MD [place], [place]

## 2018-07-21 NOTE — Telephone Encounter (Signed)
Patient's daughter, [name], stated the reason the medication was filled the last time by a Dr. Benjamine Sprague was because the patient was in the hospital and that's the doctor that saw her there and they refilled the medication, but Dr. Nicki Reaper originally prescribed the medication.  They would like this medication refilled again by Dr. Nicki Reaper because in the interim of all of this confusion, the patient has been out of the medication for two days now.

## 2018-07-21 NOTE — Telephone Encounter (Signed)
[name] notified prescription was sent.

## 2018-07-29 ENCOUNTER — Ambulatory Visit (INDEPENDENT_AMBULATORY_CARE_PROVIDER_SITE_OTHER): Payer: Medicare Other | Admitting: Internal Medicine

## 2018-07-29 ENCOUNTER — Encounter: Payer: Self-pay | Admitting: Internal Medicine

## 2018-07-29 VITALS — BP 162/64 | HR 78 | Temp 97.3°F | Resp 16 | Wt 85.2 lb

## 2018-07-29 DIAGNOSIS — E7800 Pure hypercholesterolemia, unspecified: Secondary | ICD-10-CM

## 2018-07-29 DIAGNOSIS — N185 Chronic kidney disease, stage 5: Secondary | ICD-10-CM

## 2018-07-29 DIAGNOSIS — K219 Gastro-esophageal reflux disease without esophagitis: Secondary | ICD-10-CM | POA: Diagnosis not present

## 2018-07-29 DIAGNOSIS — J309 Allergic rhinitis, unspecified: Secondary | ICD-10-CM | POA: Diagnosis not present

## 2018-07-29 DIAGNOSIS — E538 Deficiency of other specified B group vitamins: Secondary | ICD-10-CM

## 2018-07-29 DIAGNOSIS — E034 Atrophy of thyroid (acquired): Secondary | ICD-10-CM

## 2018-07-29 DIAGNOSIS — E1151 Type 2 diabetes mellitus with diabetic peripheral angiopathy without gangrene: Secondary | ICD-10-CM | POA: Diagnosis not present

## 2018-07-29 DIAGNOSIS — I10 Essential (primary) hypertension: Secondary | ICD-10-CM

## 2018-07-29 DIAGNOSIS — D631 Anemia in chronic kidney disease: Secondary | ICD-10-CM

## 2018-07-29 DIAGNOSIS — I739 Peripheral vascular disease, unspecified: Secondary | ICD-10-CM

## 2018-07-29 MED ORDER — CYANOCOBALAMIN 1000 MCG/ML IJ SOLN
1000.0000 ug | Freq: Once | INTRAMUSCULAR | Status: AC
  Administered 2018-07-29: 1000 ug via INTRAMUSCULAR

## 2018-07-29 MED ORDER — AMLODIPINE BESYLATE 10 MG PO TABS: 10.0000 mg | ORAL_TABLET | Freq: Every day | ORAL | 1 refills | Status: DC

## 2018-07-29 MED ORDER — FAMOTIDINE 10 MG PO TABS: 10.0000 mg | ORAL_TABLET | Freq: Two times a day (BID) | ORAL | 1 refills | Status: DC

## 2018-07-29 MED ORDER — LABETALOL HCL 100 MG PO TABS: 100.0000 mg | ORAL_TABLET | Freq: Two times a day (BID) | ORAL | 1 refills | Status: DC

## 2018-07-29 MED ORDER — SYNTHROID 50 MCG PO TABS: 50.0000 ug | ORAL_TABLET | Freq: Every day | ORAL | 1 refills | Status: DC

## 2018-07-29 MED ORDER — FEXOFENADINE HCL 180 MG PO TABS: ORAL_TABLET | ORAL | 1 refills | Status: DC

## 2018-07-29 MED ORDER — HYDRALAZINE HCL 100 MG PO TABS: 100.0000 mg | ORAL_TABLET | Freq: Three times a day (TID) | ORAL | 1 refills | Status: DC

## 2018-07-29 NOTE — Patient Instructions (Signed)
Saline nasal spray - flush nose at least 2-3x/day  nasacort nasal spray - 2 sprays each nostril one time per day.  Do this in the evening.  

## 2018-07-29 NOTE — Progress Notes (Signed)
Patient ID: Tara [name], female   DOB: 25-Feb-1918, 84 y.o.   MRN: [id number]   Subjective:    Patient ID: Tara [name], female    DOB: Jun 21, 1918, 84 y.o.   MRN: [id number]  HPI  Patient here for a scheduled follow up.  She is accompanied by her daughter.  History obtained from both of them.  Was hospitalized 05/26/18 with hypertensive emergency.  Medications adjusted. Also found to have infected post op wound - right groin.  S/p wound vac.  Was on abx and being followed by home health.  Seeing surgery.   Last evaluated 07/12/18.  Wound essentially healed.  Is off insulin.  Trying to stay active.  Eating.  No nausea or vomiting.  No abdominal pain.  Bowels moving.  States blood pressure has been doing much better.     Past Medical History:  Diagnosis Date  . Asthma   . Diabetes mellitus ([place])   . Environmental allergies   . Hypertension    Past Surgical History:  Procedure Laterality Date  . APPENDECTOMY    . APPLICATION OF WOUND VAC Right 05/14/2018   Procedure: APPLICATION OF WOUND VAC;  Surgeon: Benjamine Sprague, DO;  Location: [facility];  Service: General;  Laterality: Right;  . INCISION AND DRAINAGE ABSCESS Right 05/14/2018   Procedure: INCISION AND DRAINAGE ABSCESS RIGHT GROIN;  Surgeon: Benjamine Sprague, DO;  Location: [facility];  Service: General;  Laterality: Right;  . INGUINAL HERNIA REPAIR  03/09/2018   Procedure: HERNIA REPAIR INGUINAL INCARCERATED;  Surgeon: Benjamine Sprague, DO;  Location: [facility];  Service: General;;  . INSERTION OF MESH  03/09/2018   Procedure: INSERTION OF MESH;  Surgeon: Benjamine Sprague, DO;  Location: [facility];  Service: General;;  . LAPAROSCOPY N/A 03/09/2018   Procedure: LAPAROSCOPY DIAGNOSTIC;  Surgeon: Benjamine Sprague, DO;  Location: [facility];  Service: General;  Laterality: N/A;  . TUBAL LIGATION     Family History  Problem Relation Age of Onset  . Hypertension Mother   . Cancer Brother        lung (question)  . Cancer Sister        questionable type   Social  History   Socioeconomic History  . Marital status: Widowed    Spouse name: Not on file  . Number of children: Not on file  . Years of education: Not on file  . Highest education level: Not on file  Occupational History  . Not on file  Social Needs  . Financial resource strain: Not on file  . Food insecurity:    Worry: Not on file    Inability: Not on file  . Transportation needs:    Medical: Not on file    Non-medical: Not on file  Tobacco Use  . Smoking status: Never Smoker  . Smokeless tobacco: Never Used  Substance and Sexual Activity  . Alcohol use: No    Alcohol/week: 0.0 standard drinks  . Drug use: No  . Sexual activity: Not on file  Lifestyle  . Physical activity:    Days per week: Not on file    Minutes per session: Not on file  . Stress: Not on file  Relationships  . Social connections:    Talks on phone: Not on file    Gets together: Not on file    Attends religious service: Not on file    Active member of club or organization: Not on file    Attends meetings of clubs or organizations: Not on  file    Relationship status: Not on file  Other Topics Concern  . Not on file  Social History Narrative  . Not on file    Outpatient Encounter Medications as of 07/29/2018  Medication Sig  . acetaminophen (TYLENOL) 325 MG tablet Take 650 mg by mouth every 6 (six) hours as needed for mild pain.   Marland Kitchen amLODipine (NORVASC) 10 MG tablet Take 1 tablet (10 mg total) by mouth daily.  Marland Kitchen aspirin (ASPIRIN 81) 81 MG EC tablet Take 81 mg by mouth daily. Swallow whole.  . Calcium Carbonate-Vitamin D (CALTRATE 600+D PO) Take 1 tablet by mouth daily.   . famotidine (PEPCID) 10 MG tablet Take 1 tablet (10 mg total) by mouth 2 (two) times daily.  . fexofenadine (ALLEGRA) 180 MG tablet Take 1 tablet (180 mg total) by mouth daily.  . hydrALAZINE (APRESOLINE) 100 MG tablet Take 1 tablet (100 mg total) by mouth 3 (three) times daily.  Marland Kitchen labetalol (NORMODYNE) 100 MG tablet Take 1 tablet  (100 mg total) by mouth 2 (two) times daily.  Marland Kitchen SYNTHROID 50 MCG tablet Take 1 tablet (50 mcg total) by mouth daily.  . [DISCONTINUED] Amino Acids-Protein Hydrolys (FEEDING SUPPLEMENT, PRO-STAT SUGAR FREE 64,) LIQD Take 30 mLs by mouth 2 (two) times daily between meals.  . [DISCONTINUED] amLODipine (NORVASC) 10 MG tablet Take 1 tablet (10 mg total) by mouth daily.  . [DISCONTINUED] diclofenac sodium (VOLTAREN) 1 % GEL Apply 4 g topically 3 (three) times daily as needed. Apply to the Right Knee for effusion  . [DISCONTINUED] famotidine (PEPCID) 10 MG tablet Take 10 mg by mouth 2 (two) times daily.  . [DISCONTINUED] feeding supplement, ENSURE ENLIVE, (ENSURE ENLIVE) LIQD Take 237 mLs by mouth 2 (two) times daily between meals.  . [DISCONTINUED] fexofenadine (ALLEGRA) 180 MG tablet Take 1 tablet (180 mg total) by mouth daily.  . [DISCONTINUED] glucose blood (ACCU-CHEK AVIVA PLUS) test strip CHECK SUGAR ONCE DAILY  . [DISCONTINUED] hydrALAZINE (APRESOLINE) 100 MG tablet Take 1 tablet (100 mg total) by mouth 3 (three) times daily.  . [DISCONTINUED] Insulin Glargine (LANTUS SOLOSTAR) 100 UNIT/ML Solostar Pen INJECT 6 UNITS UNDER THE SKIN ONCE DAILY  . [DISCONTINUED] Insulin Pen Needle (PEN NEEDLES) 32G X 4 MM MISC 1 application by Does not apply route daily.  . [DISCONTINUED] labetalol (NORMODYNE) 100 MG tablet Take 1 tablet (100 mg total) by mouth 2 (two) times daily.  . [DISCONTINUED] polyethylene glycol (MIRALAX / GLYCOLAX) packet Take 17 g by mouth daily.  . [DISCONTINUED] potassium chloride (K-DUR) 10 MEQ tablet Take 10 mEq by mouth daily.  . [DISCONTINUED] senna-docusate (SENOKOT-S) 8.6-50 MG tablet Take 2 tablets by mouth 2 (two) times daily.  . [DISCONTINUED] SYNTHROID 50 MCG tablet Take 1 tablet (50 mcg total) by mouth daily.  . [EXPIRED] cyanocobalamin ((VITAMIN B-12)) injection 1,000 mcg    No facility-administered encounter medications on file as of 07/29/2018.     Review of Systems    Constitutional: Negative for appetite change and fever.  HENT: Negative for sinus pressure.        Some nasal congestion.    Respiratory: Negative for cough, chest tightness and shortness of breath.   Cardiovascular: Negative for chest pain, palpitations and leg swelling.  Gastrointestinal: Negative for abdominal pain, diarrhea, nausea and vomiting.  Genitourinary: Negative for difficulty urinating and dysuria.  Musculoskeletal: Negative for joint swelling and myalgias.  Skin: Negative for color change and rash.  Neurological: Negative for dizziness, light-headedness and headaches.  Psychiatric/Behavioral: Negative  for agitation and dysphoric mood.       Objective:    Physical Exam Constitutional:      General: She is not in acute distress.    Appearance: Normal appearance.  HENT:     Nose: Nose normal. No congestion.     Mouth/Throat:     Pharynx: No oropharyngeal exudate or posterior oropharyngeal erythema.  Neck:     Musculoskeletal: Neck supple. No muscular tenderness.     Thyroid: No thyromegaly.  Cardiovascular:     Rate and Rhythm: Normal rate and regular rhythm.  Pulmonary:     Effort: No respiratory distress.     Breath sounds: Normal breath sounds. No wheezing.  Abdominal:     General: Bowel sounds are normal.     Palpations: Abdomen is soft.     Tenderness: There is no abdominal tenderness.  Musculoskeletal:        General: No swelling or tenderness.  Lymphadenopathy:     Cervical: No cervical adenopathy.  Skin:    Findings: No erythema or rash.  Neurological:     Mental Status: She is alert.  Psychiatric:        Mood and Affect: Mood normal.        Behavior: Behavior normal.     BP (!) 162/64 (BP Location: Left Arm, Patient Position: Sitting, Cuff Size: Normal)   Pulse 78   Temp (!) 97.3 F (36.3 C) (Oral)   Resp 16   Wt 85 lb 3.2 oz (38.6 kg)   SpO2 97%   BMI 17.81 kg/m  Wt Readings from Last 3 Encounters:  07/29/18 85 lb 3.2 oz (38.6 kg)   06/12/18 88 lb (39.9 kg)  05/27/18 87 lb (39.5 kg)     Lab Results  Component Value Date   WBC 6.6 05/26/2018   HGB 10.5 (L) 05/26/2018   HCT 31.6 (L) 05/26/2018   PLT 424 (H) 05/26/2018   GLUCOSE 271 (H) 05/26/2018   CHOL 209 (H) 06/21/2014   TRIG 112 03/08/2018   HDL 77.00 06/21/2014   LDLDIRECT 107.1 07/29/2013   LDLCALC 111 (H) 06/21/2014   ALT 10 03/16/2018   AST 18 03/16/2018   NA 139 05/26/2018   K 4.7 05/26/2018   CL 106 05/26/2018   CREATININE 1.01 (H) 05/26/2018   BUN 35 (H) 05/26/2018   CO2 24 05/26/2018   TSH 1.765 10/31/2017   INR 1.0 01/25/2013   HGBA1C 7.3 (H) 05/19/2018   MICROALBUR 115.0 Repeated and verified X2. (H) 07/29/2013    Ct Head Wo Contrast  Result Date: 06/12/2018 CLINICAL DATA:  Swelling to left cheek and pain two right knee. Denies loss of consciousness. EXAM: CT HEAD WITHOUT CONTRAST CT MAXILLOFACIAL WITHOUT CONTRAST TECHNIQUE: Multidetector CT imaging of the head and maxillofacial structures were performed using the standard protocol without intravenous contrast. Multiplanar CT image reconstructions of the maxillofacial structures were also generated. COMPARISON:  CT head 07/08/2014 FINDINGS: CT HEAD FINDINGS Brain: No evidence of acute infarction, hemorrhage, hydrocephalus, extra-axial collection or mass lesion/mass effect. There is mild diffuse low-attenuation within the subcortical and periventricular white matter compatible with chronic microvascular disease. Prominence of the sulci and ventricles noted. Vascular: No hyperdense vessel or unexpected calcification. Skull: Normal. Negative for fracture or focal lesion. Other: None. CT MAXILLOFACIAL FINDINGS Osseous: No fracture or mandibular dislocation. No destructive process. Orbits: Negative. No traumatic or inflammatory finding. Sinuses: Clear. Soft tissues: Hematoma along the left side of face is identified, image 38/6. IMPRESSION: 1. No acute intracranial abnormalities.  2. Chronic small vessel  ischemic disease and brain atrophy. 3. No evidence for facial bone fracture. 4. Electronically Signed   By: Kerby Moors M.D.   On: 06/12/2018 20:10   Dg Knee Complete 4 Views Right  Result Date: 06/12/2018 CLINICAL DATA:  Right knee pain after fall. EXAM: RIGHT KNEE - COMPLETE 4+ VIEW COMPARISON:  CT right knee dated November 01, 2017. Right knee x-rays dated October 30, 2017. FINDINGS: No acute fracture or dislocation. Moderate to large joint effusion. Mild progressive lateral compartment degenerative changes. Osteopenia. Vascular calcifications. IMPRESSION: 1. No acute osseous abnormality. Persistent moderate to large joint effusion. 2. Mildly progressive lateral compartment degenerative changes. Electronically Signed   By: Titus Dubin M.D.   On: 06/12/2018 20:19   Ct Maxillofacial Wo Contrast  Result Date: 06/12/2018 CLINICAL DATA:  Swelling to left cheek and pain two right knee. Denies loss of consciousness. EXAM: CT HEAD WITHOUT CONTRAST CT MAXILLOFACIAL WITHOUT CONTRAST TECHNIQUE: Multidetector CT imaging of the head and maxillofacial structures were performed using the standard protocol without intravenous contrast. Multiplanar CT image reconstructions of the maxillofacial structures were also generated. COMPARISON:  CT head 07/08/2014 FINDINGS: CT HEAD FINDINGS Brain: No evidence of acute infarction, hemorrhage, hydrocephalus, extra-axial collection or mass lesion/mass effect. There is mild diffuse low-attenuation within the subcortical and periventricular white matter compatible with chronic microvascular disease. Prominence of the sulci and ventricles noted. Vascular: No hyperdense vessel or unexpected calcification. Skull: Normal. Negative for fracture or focal lesion. Other: None. CT MAXILLOFACIAL FINDINGS Osseous: No fracture or mandibular dislocation. No destructive process. Orbits: Negative. No traumatic or inflammatory finding. Sinuses: Clear. Soft tissues: Hematoma along the left side of  face is identified, image 38/6. IMPRESSION: 1. No acute intracranial abnormalities. 2. Chronic small vessel ischemic disease and brain atrophy. 3. No evidence for facial bone fracture. 4. Electronically Signed   By: Kerby Moors M.D.   On: 06/12/2018 20:10       Assessment & Plan:   Problem List Items Addressed This Visit    Allergic rhinitis    On allegra.  Discussed using steroid nasal spray and saline nasal spray as outlined.  Follow.        Anemia    Follow cbc.        Relevant Medications   cyanocobalamin ((VITAMIN B-12)) injection 1,000 mcg (Completed)   B12 deficiency - Primary   Relevant Medications   cyanocobalamin ((VITAMIN B-12)) injection 1,000 mcg (Completed)   Diabetes mellitus with peripheral vascular disease ([place])    On no medication.  No on insulin.  States sugars are doing well.  Follow.        Relevant Medications   amLODipine (NORVASC) 10 MG tablet   hydrALAZINE (APRESOLINE) 100 MG tablet   labetalol (NORMODYNE) 100 MG tablet   GERD without esophagitis    Controlled on current regimen.  Follow.        Relevant Medications   famotidine (PEPCID) 10 MG tablet   HTN (hypertension), malignant    Blood pressure has been hard to control.  Recently admitted and had medications adjusted.  Recheck blood pressure - improved.  Continue current medication regimen.  Follow pressures.  Follow metabolic panel.        Relevant Medications   amLODipine (NORVASC) 10 MG tablet   hydrALAZINE (APRESOLINE) 100 MG tablet   labetalol (NORMODYNE) 100 MG tablet   Hypercholesteremia    Follow lipid panel.       Relevant Medications   amLODipine (NORVASC) 10 MG  tablet   hydrALAZINE (APRESOLINE) 100 MG tablet   labetalol (NORMODYNE) 100 MG tablet   Hypothyroidism    On thyroid replacement.  Follow tsh.        Relevant Medications   labetalol (NORMODYNE) 100 MG tablet   SYNTHROID 50 MCG tablet   Peripheral vascular disease ([place])    Has been evaluated by vascular  surgery.  Stable.       Relevant Medications   amLODipine (NORVASC) 10 MG tablet   hydrALAZINE (APRESOLINE) 100 MG tablet   labetalol (NORMODYNE) 100 MG tablet       Einar Pheasant, MD

## 2018-08-05 ENCOUNTER — Encounter: Payer: Self-pay | Admitting: Internal Medicine

## 2018-08-05 ENCOUNTER — Other Ambulatory Visit: Payer: Self-pay | Admitting: Internal Medicine

## 2018-08-05 MED ORDER — GLUCOSE BLOOD VI STRP: ORAL_STRIP | 6 refills | Status: DC

## 2018-08-05 NOTE — Telephone Encounter (Signed)
Copied from [place] [id number]. Topic: General - Other >> Aug 05, 2018 12:01 PM Lennox Solders wrote: Reason for CRM: pt needs new rx glucose test strips (accu-chek)send to  [facility] [address] in [place]. [facility] close

## 2018-08-07 ENCOUNTER — Encounter: Payer: Self-pay | Admitting: Internal Medicine

## 2018-08-07 NOTE — Assessment & Plan Note (Signed)
Blood pressure has been hard to control.  Recently admitted and had medications adjusted.  Recheck blood pressure - improved.  Continue current medication regimen.  Follow pressures.  Follow metabolic panel.

## 2018-08-07 NOTE — Assessment & Plan Note (Signed)
On thyroid replacement.  Follow tsh.  

## 2018-08-07 NOTE — Assessment & Plan Note (Signed)
Has been evaluated by vascular surgery.  Stable.  

## 2018-08-07 NOTE — Assessment & Plan Note (Signed)
Follow lipid panel.   

## 2018-08-07 NOTE — Assessment & Plan Note (Signed)
Controlled on current regimen.  Follow.  

## 2018-08-07 NOTE — Assessment & Plan Note (Signed)
Follow cbc.  

## 2018-08-07 NOTE — Assessment & Plan Note (Signed)
On no medication.  No on insulin.  States sugars are doing well.  Follow.

## 2018-08-07 NOTE — Assessment & Plan Note (Signed)
On allegra.  Discussed using steroid nasal spray and saline nasal spray as outlined.  Follow.

## 2018-09-01 ENCOUNTER — Ambulatory Visit (INDEPENDENT_AMBULATORY_CARE_PROVIDER_SITE_OTHER): Payer: Medicare Other | Admitting: Lab

## 2018-09-01 DIAGNOSIS — E538 Deficiency of other specified B group vitamins: Secondary | ICD-10-CM | POA: Diagnosis not present

## 2018-09-01 MED ORDER — CYANOCOBALAMIN 1000 MCG/ML IJ SOLN
1000.0000 ug | Freq: Once | INTRAMUSCULAR | Status: AC
  Administered 2018-09-01: 1000 ug via INTRAMUSCULAR

## 2018-09-01 NOTE — Progress Notes (Addendum)
Pt here today for VB-12 injection. Administered in Left- Deltoid, Pt tolerated well.  Reviewed.  Dr Nicki Reaper

## 2018-09-20 DIAGNOSIS — B351 Tinea unguium: Secondary | ICD-10-CM | POA: Diagnosis not present

## 2018-09-20 DIAGNOSIS — E1159 Type 2 diabetes mellitus with other circulatory complications: Secondary | ICD-10-CM | POA: Diagnosis not present

## 2018-11-08 ENCOUNTER — Ambulatory Visit (INDEPENDENT_AMBULATORY_CARE_PROVIDER_SITE_OTHER): Payer: Medicare Other | Admitting: Internal Medicine

## 2018-11-08 ENCOUNTER — Encounter: Payer: Self-pay | Admitting: Internal Medicine

## 2018-11-08 ENCOUNTER — Other Ambulatory Visit: Payer: Self-pay

## 2018-11-08 VITALS — BP 160/78 | HR 62 | Temp 98.2°F | Wt 86.2 lb

## 2018-11-08 DIAGNOSIS — N185 Chronic kidney disease, stage 5: Secondary | ICD-10-CM | POA: Diagnosis not present

## 2018-11-08 DIAGNOSIS — E538 Deficiency of other specified B group vitamins: Secondary | ICD-10-CM | POA: Diagnosis not present

## 2018-11-08 DIAGNOSIS — R634 Abnormal weight loss: Secondary | ICD-10-CM

## 2018-11-08 DIAGNOSIS — H6122 Impacted cerumen, left ear: Secondary | ICD-10-CM

## 2018-11-08 DIAGNOSIS — E7800 Pure hypercholesterolemia, unspecified: Secondary | ICD-10-CM | POA: Diagnosis not present

## 2018-11-08 DIAGNOSIS — I10 Essential (primary) hypertension: Secondary | ICD-10-CM

## 2018-11-08 DIAGNOSIS — E119 Type 2 diabetes mellitus without complications: Secondary | ICD-10-CM

## 2018-11-08 DIAGNOSIS — E1151 Type 2 diabetes mellitus with diabetic peripheral angiopathy without gangrene: Secondary | ICD-10-CM

## 2018-11-08 DIAGNOSIS — E034 Atrophy of thyroid (acquired): Secondary | ICD-10-CM

## 2018-11-08 DIAGNOSIS — J309 Allergic rhinitis, unspecified: Secondary | ICD-10-CM

## 2018-11-08 DIAGNOSIS — I739 Peripheral vascular disease, unspecified: Secondary | ICD-10-CM

## 2018-11-08 DIAGNOSIS — D631 Anemia in chronic kidney disease: Secondary | ICD-10-CM | POA: Diagnosis not present

## 2018-11-08 DIAGNOSIS — M79605 Pain in left leg: Secondary | ICD-10-CM

## 2018-11-08 DIAGNOSIS — K219 Gastro-esophageal reflux disease without esophagitis: Secondary | ICD-10-CM

## 2018-11-08 DIAGNOSIS — H6123 Impacted cerumen, bilateral: Secondary | ICD-10-CM

## 2018-11-08 DIAGNOSIS — M79604 Pain in right leg: Secondary | ICD-10-CM

## 2018-11-08 LAB — BASIC METABOLIC PANEL
BUN: 39 mg/dL — AB (ref 6–23)
CO2: 24 mEq/L (ref 19–32)
Calcium: 9.1 mg/dL (ref 8.4–10.5)
Chloride: 107 mEq/L (ref 96–112)
Creatinine, Ser: 1.01 mg/dL (ref 0.40–1.20)
GFR: 61.14 mL/min (ref >60.00)
GLUCOSE: 130 mg/dL — AB (ref 70–99)
POTASSIUM: 4 meq/L (ref 3.5–5.1)
SODIUM: 142 meq/L (ref 135–145)

## 2018-11-08 LAB — CBC WITH DIFFERENTIAL/PLATELET
BASOS ABS: 0 10*3/uL (ref 0.0–0.1)
Basophils Relative: 0.5 % (ref 0.0–3.0)
EOS ABS: 0.3 10*3/uL (ref 0.0–0.7)
Eosinophils Relative: 5.4 % — ABNORMAL HIGH (ref 0.0–5.0)
HEMATOCRIT: 32 % — AB (ref 36.0–46.0)
Hemoglobin: 10.8 g/dL — ABNORMAL LOW (ref 12.0–15.0)
Lymphocytes Relative: 30.5 % (ref 12.0–46.0)
Lymphs Abs: 1.6 10*3/uL (ref 0.7–4.0)
MCHC: 33.8 g/dL (ref 30.0–36.0)
MCV: 94.6 fl (ref 78.0–100.0)
Monocytes Absolute: 0.5 10*3/uL (ref 0.1–1.0)
Monocytes Relative: 8.9 % (ref 3.0–12.0)
NEUTROS ABS: 2.8 10*3/uL (ref 1.4–7.7)
NEUTROS PCT: 54.7 % (ref 43.0–77.0)
PLATELETS: 270 10*3/uL (ref 150.0–400.0)
RBC: 3.38 Mil/uL — AB (ref 3.87–5.11)
RDW: 15.4 % (ref 11.5–15.5)
WBC: 5.1 10*3/uL (ref 4.0–10.5)

## 2018-11-08 LAB — LIPID PANEL
CHOL/HDL RATIO: 3
Cholesterol: 238 mg/dL — ABNORMAL HIGH (ref 0–200)
HDL: 95 mg/dL (ref >39.00)
LDL CALC: 117 mg/dL — AB (ref 0–99)
NONHDL: 142.65
TRIGLYCERIDES: 126 mg/dL (ref 0.0–149.0)
VLDL: 25.2 mg/dL (ref 0.0–40.0)

## 2018-11-08 LAB — FERRITIN: Ferritin: 70 ng/mL (ref 10.0–291.0)

## 2018-11-08 LAB — HEPATIC FUNCTION PANEL
ALK PHOS: 41 U/L (ref 39–117)
ALT: 11 U/L (ref 0–35)
AST: 19 U/L (ref 0–37)
Albumin: 3.7 g/dL (ref 3.5–5.2)
BILIRUBIN DIRECT: 0.1 mg/dL (ref 0.0–0.3)
BILIRUBIN TOTAL: 0.4 mg/dL (ref 0.2–1.2)
TOTAL PROTEIN: 7.1 g/dL (ref 6.0–8.3)

## 2018-11-08 LAB — IBC PANEL
Iron: 67 ug/dL (ref 42–145)
SATURATION RATIOS: 23.2 % (ref 20.0–50.0)
TRANSFERRIN: 206 mg/dL — AB (ref 212.0–360.0)

## 2018-11-08 LAB — TSH: TSH: 1.81 u[IU]/mL (ref 0.35–4.50)

## 2018-11-08 LAB — GLUCOSE, POCT (MANUAL RESULT ENTRY): POC GLUCOSE: 130 mg/dL — AB (ref 70–99)

## 2018-11-08 LAB — HEMOGLOBIN A1C: Hgb A1c MFr Bld: 5.5 % (ref 4.6–6.5)

## 2018-11-08 LAB — VITAMIN B12: Vitamin B-12: 491 pg/mL (ref 211–911)

## 2018-11-08 NOTE — Progress Notes (Signed)
Patient ID: MAREESA GATHRIGHT, female   DOB: [birth year], 85 y.o.   MRN: [id number]   Subjective:    Patient ID: [name], female    DOB: Aug 30, 1917, 85 y.o.   MRN: [id number]  HPI  Patient here for a scheduled follow up.  She is accompanied by her daughter.  History obtained from both of them.  She reports she is doing relatively well.  Nasal congestion better.  Using nasal spray.  Breathing better.  Is concerned regarding cerumen impaction - left ear.  Has hearing aid.  Has been using ear wax softener.  Also sticking Q tips in her ear.  States has noticed some decrease in her hearing since using the softener.  No pain in the ear.  No chest congestion or cough.  No chest pain or sob.  No acid reflux.  Taking a stool softener.  Bowels moving better with this regimen.  States sugars are doing well.  AM sugars averaging 116-130s.  No low sugars.  She is eating.  Weight stable from the last couple of checks.  States outside blood pressures have been under good control.     Past Medical History:  Diagnosis Date   Asthma    Diabetes mellitus ([place])    Environmental allergies    Hypertension    Past Surgical History:  Procedure Laterality Date   APPENDECTOMY     APPLICATION OF WOUND VAC Right 05/14/2018   Procedure: APPLICATION OF WOUND VAC;  Surgeon: Benjamine Sprague, DO;  Location: [facility];  Service: General;  Laterality: Right;   INCISION AND DRAINAGE ABSCESS Right 05/14/2018   Procedure: INCISION AND DRAINAGE ABSCESS RIGHT GROIN;  Surgeon: Benjamine Sprague, DO;  Location: [facility];  Service: General;  Laterality: Right;   INGUINAL HERNIA REPAIR  03/09/2018   Procedure: HERNIA REPAIR INGUINAL INCARCERATED;  Surgeon: Benjamine Sprague, DO;  Location: [facility];  Service: General;;   INSERTION OF MESH  03/09/2018   Procedure: INSERTION OF MESH;  Surgeon: Benjamine Sprague, DO;  Location: [facility];  Service: General;;   LAPAROSCOPY N/A 03/09/2018   Procedure: LAPAROSCOPY DIAGNOSTIC;  Surgeon: Benjamine Sprague,  DO;  Location: [facility];  Service: General;  Laterality: N/A;   TUBAL LIGATION     Family History  Problem Relation Age of Onset   Hypertension Mother    Cancer Brother        lung (question)   Cancer Sister        questionable type   Social History   Socioeconomic History   Marital status: Widowed    Spouse name: Not on file   Number of children: Not on file   Years of education: Not on file   Highest education level: Not on file  Occupational History   Not on file  Social Needs   Financial resource strain: Not on file   Food insecurity:    Worry: Not on file    Inability: Not on file   Transportation needs:    Medical: Not on file    Non-medical: Not on file  Tobacco Use   Smoking status: Never Smoker   Smokeless tobacco: Never Used  Substance and Sexual Activity   Alcohol use: No    Alcohol/week: 0.0 standard drinks   Drug use: No   Sexual activity: Not on file  Lifestyle   Physical activity:    Days per week: Not on file    Minutes per session: Not on file   Stress: Not on file  Relationships  Social connections:    Talks on phone: Not on file    Gets together: Not on file    Attends religious service: Not on file    Active member of club or organization: Not on file    Attends meetings of clubs or organizations: Not on file    Relationship status: Not on file  Other Topics Concern   Not on file  Social History Narrative   Not on file    Outpatient Encounter Medications as of 11/08/2018  Medication Sig   acetaminophen (TYLENOL) 325 MG tablet Take 650 mg by mouth every 6 (six) hours as needed for mild pain.    amLODipine (NORVASC) 10 MG tablet Take 1 tablet (10 mg total) by mouth daily.   aspirin (ASPIRIN 81) 81 MG EC tablet Take 81 mg by mouth daily. Swallow whole.   Calcium Carbonate-Vitamin D (CALTRATE 600+D PO) Take 1 tablet by mouth daily.    famotidine (PEPCID) 10 MG tablet Take 1 tablet (10 mg total) by mouth 2 (two)  times daily.   fexofenadine (ALLEGRA) 180 MG tablet Take 1 tablet (180 mg total) by mouth daily.   glucose blood (ACCU-CHEK AVIVA PLUS) test strip CHECK SUGAR ONCE DAILY   hydrALAZINE (APRESOLINE) 100 MG tablet Take 1 tablet (100 mg total) by mouth 3 (three) times daily.   labetalol (NORMODYNE) 100 MG tablet Take 1 tablet (100 mg total) by mouth 2 (two) times daily.   SYNTHROID 50 MCG tablet Take 1 tablet (50 mcg total) by mouth daily.   No facility-administered encounter medications on file as of 11/08/2018.     Review of Systems  Constitutional: Negative for appetite change, fever and unexpected weight change.  HENT: Negative for sinus pressure.        Nasal congestion better.  Concerned regarding ear wax - left ear and some decreased hearing.  No pain.    Respiratory: Negative for cough, chest tightness and shortness of breath.   Cardiovascular: Negative for chest pain, palpitations and leg swelling.  Gastrointestinal: Negative for abdominal pain, diarrhea, nausea and vomiting.  Genitourinary: Negative for difficulty urinating and dysuria.  Musculoskeletal: Negative for myalgias.       Persistent intermittent leg discomfort.  Better when up and walking.    Skin: Negative for color change and rash.  Neurological: Negative for dizziness, light-headedness and headaches.  Psychiatric/Behavioral: Negative for agitation and dysphoric mood.       Objective:    Physical Exam Constitutional:      General: She is not in acute distress. HENT:     Head:     Comments: Cerumen impaction - bilateral ears.      Nose: Nose normal. No congestion.     Mouth/Throat:     Pharynx: No oropharyngeal exudate or posterior oropharyngeal erythema.  Neck:     Musculoskeletal: Neck supple. No muscular tenderness.     Thyroid: No thyromegaly.  Cardiovascular:     Rate and Rhythm: Normal rate and regular rhythm.  Pulmonary:     Effort: No respiratory distress.     Breath sounds: Normal breath  sounds. No wheezing.  Abdominal:     General: Bowel sounds are normal.     Palpations: Abdomen is soft.     Tenderness: There is no abdominal tenderness.  Musculoskeletal:        General: No swelling or tenderness.  Lymphadenopathy:     Cervical: No cervical adenopathy.  Skin:    Findings: No erythema or rash.  Neurological:  Mental Status: She is alert.  Psychiatric:        Mood and Affect: Mood normal.        Behavior: Behavior normal.     BP (!) 160/78    Pulse 62    Temp 98.2 F (36.8 C) (Oral)    Wt 86 lb 3.2 oz (39.1 kg)    SpO2 97%    BMI 18.02 kg/m  Wt Readings from Last 3 Encounters:  11/08/18 86 lb 3.2 oz (39.1 kg)  07/29/18 85 lb 3.2 oz (38.6 kg)  06/12/18 88 lb (39.9 kg)     Lab Results  Component Value Date   WBC 5.1 11/08/2018   HGB 10.8 (L) 11/08/2018   HCT 32.0 (L) 11/08/2018   PLT 270.0 11/08/2018   GLUCOSE 130 (H) 11/08/2018   CHOL 238 (H) 11/08/2018   TRIG 126.0 11/08/2018   HDL 95.00 11/08/2018   LDLDIRECT 107.1 07/29/2013   LDLCALC 117 (H) 11/08/2018   ALT 11 11/08/2018   AST 19 11/08/2018   NA 142 11/08/2018   K 4.0 11/08/2018   CL 107 11/08/2018   CREATININE 1.01 11/08/2018   BUN 39 (H) 11/08/2018   CO2 24 11/08/2018   TSH 1.81 11/08/2018   INR 1.0 01/25/2013   HGBA1C 5.5 11/08/2018   MICROALBUR 115.0 Repeated and verified X2. (H) 07/29/2013    Ct Head Wo Contrast  Result Date: 06/12/2018 CLINICAL DATA:  Swelling to left cheek and pain two right knee. Denies loss of consciousness. EXAM: CT HEAD WITHOUT CONTRAST CT MAXILLOFACIAL WITHOUT CONTRAST TECHNIQUE: Multidetector CT imaging of the head and maxillofacial structures were performed using the standard protocol without intravenous contrast. Multiplanar CT image reconstructions of the maxillofacial structures were also generated. COMPARISON:  CT head 07/08/2014 FINDINGS: CT HEAD FINDINGS Brain: No evidence of acute infarction, hemorrhage, hydrocephalus, extra-axial collection or mass  lesion/mass effect. There is mild diffuse low-attenuation within the subcortical and periventricular white matter compatible with chronic microvascular disease. Prominence of the sulci and ventricles noted. Vascular: No hyperdense vessel or unexpected calcification. Skull: Normal. Negative for fracture or focal lesion. Other: None. CT MAXILLOFACIAL FINDINGS Osseous: No fracture or mandibular dislocation. No destructive process. Orbits: Negative. No traumatic or inflammatory finding. Sinuses: Clear. Soft tissues: Hematoma along the left side of face is identified, image 38/6. IMPRESSION: 1. No acute intracranial abnormalities. 2. Chronic small vessel ischemic disease and brain atrophy. 3. No evidence for facial bone fracture. 4. Electronically Signed   By: Kerby Moors M.D.   On: 06/12/2018 20:10   Dg Knee Complete 4 Views Right  Result Date: 06/12/2018 CLINICAL DATA:  Right knee pain after fall. EXAM: RIGHT KNEE - COMPLETE 4+ VIEW COMPARISON:  CT right knee dated November 01, 2017. Right knee x-rays dated October 30, 2017. FINDINGS: No acute fracture or dislocation. Moderate to large joint effusion. Mild progressive lateral compartment degenerative changes. Osteopenia. Vascular calcifications. IMPRESSION: 1. No acute osseous abnormality. Persistent moderate to large joint effusion. 2. Mildly progressive lateral compartment degenerative changes. Electronically Signed   By: Titus Dubin M.D.   On: 06/12/2018 20:19   Ct Maxillofacial Wo Contrast  Result Date: 06/12/2018 CLINICAL DATA:  Swelling to left cheek and pain two right knee. Denies loss of consciousness. EXAM: CT HEAD WITHOUT CONTRAST CT MAXILLOFACIAL WITHOUT CONTRAST TECHNIQUE: Multidetector CT imaging of the head and maxillofacial structures were performed using the standard protocol without intravenous contrast. Multiplanar CT image reconstructions of the maxillofacial structures were also generated. COMPARISON:  CT head 07/08/2014 FINDINGS:  CT HEAD  FINDINGS Brain: No evidence of acute infarction, hemorrhage, hydrocephalus, extra-axial collection or mass lesion/mass effect. There is mild diffuse low-attenuation within the subcortical and periventricular white matter compatible with chronic microvascular disease. Prominence of the sulci and ventricles noted. Vascular: No hyperdense vessel or unexpected calcification. Skull: Normal. Negative for fracture or focal lesion. Other: None. CT MAXILLOFACIAL FINDINGS Osseous: No fracture or mandibular dislocation. No destructive process. Orbits: Negative. No traumatic or inflammatory finding. Sinuses: Clear. Soft tissues: Hematoma along the left side of face is identified, image 38/6. IMPRESSION: 1. No acute intracranial abnormalities. 2. Chronic small vessel ischemic disease and brain atrophy. 3. No evidence for facial bone fracture. 4. Electronically Signed   By: Kerby Moors M.D.   On: 06/12/2018 20:10       Assessment & Plan:   Problem List Items Addressed This Visit    Allergic rhinitis    Better on current regimen.  Follow.       Anemia - Primary    Recheck cbc, B12 and iron studies.        Relevant Orders   CBC with Differential/Platelet (Completed)   Ferritin (Completed)   Vitamin B12 (Completed)   IBC panel (Completed)   B12 deficiency    Recheck B12 level.        Cerumen impaction    Both ears - cerumen impaction.  Right ear irrigated.  Wax removed.  Left ear irrigated.  Large amount of wax removed.  After removal - tube visualized.  Unable to see tube prior to irrigation.  Pt unaware had tube present.  No pain.  Will have ENT evaluate ear and positioning of tube.  Pt reported hearing better after irrigation.  Discussed the importance of not putting anything (including Q tips) in her ears.        Relevant Orders   Ambulatory referral to ENT   Diabetes mellitus with peripheral vascular disease ([place])    On no medication now.  Sugars as outlined.  Doing well.  Check met b and a1c.         Relevant Orders   Hemoglobin A1c (Completed)   Basic metabolic panel (Completed)   POCT Glucose (CBG) (Completed)   GERD without esophagitis    Controlled on current regimen.        RESOLVED: HTN (hypertension), malignant   Hypercholesteremia    Follow lipid panel.       Relevant Orders   Hepatic function panel (Completed)   Lipid panel (Completed)   Hypertension    Daughter reports blood pressures at home doing well.  Elevated today.  Have her spot check her pressure.  Send in readings over the next 3-4 weeks.        Relevant Orders   TSH (Completed)   Hypothyroidism    On thyroid replacement.  Follow tsh.       Leg pain    Better with ambulation.  Follow.        Loss of weight    Weight stable from last couple of checks.  Discussed nutritional supplements.       Peripheral vascular disease ([place])    Has been evaluated by vascular surgery.  Stable.         Other Visit Diagnoses    Diabetes mellitus without complication ([facility])           Einar Pheasant, MD

## 2018-11-09 ENCOUNTER — Encounter: Payer: Self-pay | Admitting: Internal Medicine

## 2018-11-09 DIAGNOSIS — H6120 Impacted cerumen, unspecified ear: Secondary | ICD-10-CM | POA: Insufficient documentation

## 2018-11-09 NOTE — Assessment & Plan Note (Signed)
Has been evaluated by vascular surgery.  Stable.  

## 2018-11-09 NOTE — Assessment & Plan Note (Signed)
Better with ambulation.  Follow.

## 2018-11-09 NOTE — Assessment & Plan Note (Signed)
Recheck cbc, B12 and iron studies.

## 2018-11-09 NOTE — Assessment & Plan Note (Signed)
Weight stable from last couple of checks.  Discussed nutritional supplements.

## 2018-11-09 NOTE — Assessment & Plan Note (Signed)
Controlled on current regimen.   

## 2018-11-09 NOTE — Assessment & Plan Note (Signed)
Daughter reports blood pressures at home doing well.  Elevated today.  Have her spot check her pressure.  Send in readings over the next 3-4 weeks.

## 2018-11-09 NOTE — Assessment & Plan Note (Signed)
Recheck B12 level 

## 2018-11-09 NOTE — Assessment & Plan Note (Signed)
On thyroid replacement.  Follow tsh.  

## 2018-11-09 NOTE — Assessment & Plan Note (Signed)
On no medication now.  Sugars as outlined.  Doing well.  Check met b and a1c.

## 2018-11-09 NOTE — Assessment & Plan Note (Addendum)
Both ears - cerumen impaction.  Right ear irrigated.  Wax removed.  Left ear irrigated.  Large amount of wax removed.  After removal - tube visualized.  Unable to see tube prior to irrigation.  Pt unaware had tube present.  No pain.  Will have ENT evaluate ear and positioning of tube.  Pt reported hearing better after irrigation.  Discussed the importance of not putting anything (including Q tips) in her ears.

## 2018-11-09 NOTE — Assessment & Plan Note (Signed)
Better on current regimen.  Follow.

## 2018-11-09 NOTE — Assessment & Plan Note (Signed)
Follow lipid panel.   

## 2018-11-17 ENCOUNTER — Telehealth: Payer: Self-pay | Admitting: Internal Medicine

## 2018-11-17 NOTE — Telephone Encounter (Signed)
She will need to see ENT.  Are they agreeable to see them when we can get an appt scheduled.  Need to let us know if any pain or change in symptoms.  I will place order for ENT referral if agreeable.

## 2018-11-17 NOTE — Telephone Encounter (Signed)
Pts daughter called to give update. Patients ear is still draining but she is still not having any pain. Advised that I would give her a call back if they needed to do something but as long as she is not having pain it should be ok

## 2018-11-17 NOTE — Telephone Encounter (Signed)
Copied from [place] [id number]. Topic: General - Other >> Nov 17, 2018  9:19 AM Keene Breath wrote: Reason for CRM: Patient called to inform the doctor that her left ear is still running a little.  Patient said doctor wanted to be notified if there was a problem.  Please advise and call patient if appt. Is needed.  CB# [phone]

## 2018-11-17 NOTE — Telephone Encounter (Signed)
Patient is agreeable to see ENT and will call us if any changes

## 2018-11-17 NOTE — Telephone Encounter (Signed)
I have placed an order for the referral to ENT.  I know they were not previously scheduling pts.  Can we get her an appt scheduled when they are scheduling?  Thanks.

## 2018-11-18 NOTE — Telephone Encounter (Signed)
[facility] has received the referral. They still are not scheduling patients at this time. I am keeping an eye on it to see when they are going to get her scheduled.

## 2018-11-18 NOTE — Telephone Encounter (Signed)
Thank you :)

## 2018-12-20 DIAGNOSIS — J301 Allergic rhinitis due to pollen: Secondary | ICD-10-CM | POA: Diagnosis not present

## 2018-12-20 DIAGNOSIS — H6122 Impacted cerumen, left ear: Secondary | ICD-10-CM | POA: Diagnosis not present

## 2018-12-20 DIAGNOSIS — H6503 Acute serous otitis media, bilateral: Secondary | ICD-10-CM | POA: Diagnosis not present

## 2019-01-16 ENCOUNTER — Other Ambulatory Visit: Payer: Self-pay | Admitting: Internal Medicine

## 2019-01-20 ENCOUNTER — Other Ambulatory Visit: Payer: Self-pay | Admitting: Internal Medicine

## 2019-01-20 DIAGNOSIS — H6123 Impacted cerumen, bilateral: Secondary | ICD-10-CM | POA: Diagnosis not present

## 2019-01-20 DIAGNOSIS — H6503 Acute serous otitis media, bilateral: Secondary | ICD-10-CM | POA: Diagnosis not present

## 2019-02-10 ENCOUNTER — Other Ambulatory Visit: Payer: Self-pay | Admitting: Internal Medicine

## 2019-02-21 DIAGNOSIS — M3501 Sicca syndrome with keratoconjunctivitis: Secondary | ICD-10-CM | POA: Diagnosis not present

## 2019-02-24 DIAGNOSIS — H6523 Chronic serous otitis media, bilateral: Secondary | ICD-10-CM | POA: Diagnosis not present

## 2019-03-25 DIAGNOSIS — H903 Sensorineural hearing loss, bilateral: Secondary | ICD-10-CM | POA: Diagnosis not present

## 2019-04-15 ENCOUNTER — Ambulatory Visit: Payer: Self-pay

## 2019-04-15 NOTE — Telephone Encounter (Signed)
Pt.'s daughter, Tara [name], reports pt.'s BP has been up and down x 2 days. Yesterday"the top number was 200." One hour ago it was 174/79. The only symptom pt. Has is weakness. Poor appetite per daughter. No openings today per Santiago Glad. Will send triage for review per Santiago Glad. Call back number [phone]. Please advise.  Answer Assessment - Initial Assessment Questions 1. BLOOD PRESSURE: "What is the blood pressure?" "Did you take at least two measurements 5 minutes apart?"     174/79 2. ONSET: "When did you take your blood pressure?"     One hour ago 3. HOW: "How did you obtain the blood pressure?" (e.g., visiting nurse, automatic home BP monitor)     Home monitor 4. HISTORY: "Do you have a history of high blood pressure?"     Yes 5. MEDICATIONS: "Are you taking any medications for blood pressure?" "Have you missed any doses recently?"     No missed doses 6. OTHER SYMPTOMS: "Do you have any symptoms?" (e.g., headache, chest pain, blurred vision, difficulty breathing, weakness)     Feels weak 7. PREGNANCY: "Is there any chance you are pregnant?" "When was your last menstrual period?"     No  Protocols used: HIGH BLOOD PRESSURE-A-AH

## 2019-04-15 NOTE — Telephone Encounter (Signed)
[facility] is going to be taken to [facility] since this is a holiday weekend and office is closed monday

## 2019-05-07 ENCOUNTER — Other Ambulatory Visit: Payer: Self-pay | Admitting: Internal Medicine

## 2019-05-25 ENCOUNTER — Ambulatory Visit (INDEPENDENT_AMBULATORY_CARE_PROVIDER_SITE_OTHER): Payer: Medicare Other | Admitting: Internal Medicine

## 2019-05-25 ENCOUNTER — Encounter: Payer: Self-pay | Admitting: Internal Medicine

## 2019-05-25 ENCOUNTER — Other Ambulatory Visit: Payer: Self-pay

## 2019-05-25 DIAGNOSIS — N185 Chronic kidney disease, stage 5: Secondary | ICD-10-CM

## 2019-05-25 DIAGNOSIS — E7800 Pure hypercholesterolemia, unspecified: Secondary | ICD-10-CM | POA: Diagnosis not present

## 2019-05-25 DIAGNOSIS — D631 Anemia in chronic kidney disease: Secondary | ICD-10-CM | POA: Diagnosis not present

## 2019-05-25 DIAGNOSIS — J309 Allergic rhinitis, unspecified: Secondary | ICD-10-CM

## 2019-05-25 DIAGNOSIS — M25571 Pain in right ankle and joints of right foot: Secondary | ICD-10-CM | POA: Diagnosis not present

## 2019-05-25 DIAGNOSIS — Z23 Encounter for immunization: Secondary | ICD-10-CM

## 2019-05-25 DIAGNOSIS — I10 Essential (primary) hypertension: Secondary | ICD-10-CM | POA: Diagnosis not present

## 2019-05-25 DIAGNOSIS — E034 Atrophy of thyroid (acquired): Secondary | ICD-10-CM

## 2019-05-25 DIAGNOSIS — R944 Abnormal results of kidney function studies: Secondary | ICD-10-CM

## 2019-05-25 DIAGNOSIS — E1151 Type 2 diabetes mellitus with diabetic peripheral angiopathy without gangrene: Secondary | ICD-10-CM | POA: Diagnosis not present

## 2019-05-25 DIAGNOSIS — I739 Peripheral vascular disease, unspecified: Secondary | ICD-10-CM

## 2019-05-25 DIAGNOSIS — E538 Deficiency of other specified B group vitamins: Secondary | ICD-10-CM | POA: Diagnosis not present

## 2019-05-25 DIAGNOSIS — R634 Abnormal weight loss: Secondary | ICD-10-CM

## 2019-05-25 DIAGNOSIS — R7989 Other specified abnormal findings of blood chemistry: Secondary | ICD-10-CM

## 2019-05-25 LAB — CBC WITH DIFFERENTIAL/PLATELET
Basophils Absolute: 0.1 10*3/uL (ref 0.0–0.1)
Basophils Relative: 1.5 % (ref 0.0–3.0)
Eosinophils Absolute: 0.3 10*3/uL (ref 0.0–0.7)
Eosinophils Relative: 6.7 % — ABNORMAL HIGH (ref 0.0–5.0)
HCT: 30.4 % — ABNORMAL LOW (ref 36.0–46.0)
Hemoglobin: 9.9 g/dL — ABNORMAL LOW (ref 12.0–15.0)
Lymphocytes Relative: 29.1 % (ref 12.0–46.0)
Lymphs Abs: 1.4 10*3/uL (ref 0.7–4.0)
MCHC: 32.5 g/dL (ref 30.0–36.0)
MCV: 96.6 fl (ref 78.0–100.0)
Monocytes Absolute: 0.4 10*3/uL (ref 0.1–1.0)
Monocytes Relative: 8.9 % (ref 3.0–12.0)
Neutro Abs: 2.5 10*3/uL (ref 1.4–7.7)
Neutrophils Relative %: 53.8 % (ref 43.0–77.0)
Platelets: 244 10*3/uL (ref 150.0–400.0)
RBC: 3.15 Mil/uL — ABNORMAL LOW (ref 3.87–5.11)
RDW: 15.9 % — ABNORMAL HIGH (ref 11.5–15.5)
WBC: 4.7 10*3/uL (ref 4.0–10.5)

## 2019-05-25 LAB — HEPATIC FUNCTION PANEL
ALT: 8 U/L (ref 0–35)
AST: 18 U/L (ref 0–37)
Albumin: 3.9 g/dL (ref 3.5–5.2)
Alkaline Phosphatase: 38 U/L — ABNORMAL LOW (ref 39–117)
Bilirubin, Direct: 0.1 mg/dL (ref 0.0–0.3)
Total Bilirubin: 0.4 mg/dL (ref 0.2–1.2)
Total Protein: 7.4 g/dL (ref 6.0–8.3)

## 2019-05-25 LAB — LIPID PANEL
Cholesterol: 261 mg/dL — ABNORMAL HIGH (ref 0–200)
HDL: 109.5 mg/dL (ref >39.00)
LDL Cholesterol: 116 mg/dL — ABNORMAL HIGH (ref 0–99)
NonHDL: 151.69
Total CHOL/HDL Ratio: 2
Triglycerides: 178 mg/dL — ABNORMAL HIGH (ref 0.0–149.0)
VLDL: 35.6 mg/dL (ref 0.0–40.0)

## 2019-05-25 LAB — BASIC METABOLIC PANEL
BUN: 62 mg/dL — ABNORMAL HIGH (ref 6–23)
CO2: 18 mEq/L — ABNORMAL LOW (ref 19–32)
Calcium: 9.2 mg/dL (ref 8.4–10.5)
Chloride: 108 mEq/L (ref 96–112)
Creatinine, Ser: 2.01 mg/dL — ABNORMAL HIGH (ref 0.40–1.20)
GFR: 27.6 mL/min — ABNORMAL LOW (ref >60.00)
Glucose, Bld: 104 mg/dL — ABNORMAL HIGH (ref 70–99)
Potassium: 4.5 mEq/L (ref 3.5–5.1)
Sodium: 139 mEq/L (ref 135–145)

## 2019-05-25 LAB — HEMOGLOBIN A1C: Hgb A1c MFr Bld: 5 % (ref 4.6–6.5)

## 2019-05-25 LAB — FERRITIN: Ferritin: 71.9 ng/mL (ref 10.0–291.0)

## 2019-05-25 NOTE — Progress Notes (Signed)
Patient ID: MARIAJOSE MOW, female   DOB: 1917/11/17, 85 y.o.   MRN: [id number]   Subjective:    Patient ID: [name], female    DOB: 03/29/1918, 85 y.o.   MRN: [id number]  HPI  Patient here for work in appt to discuss her medications as well as leg pain.  She is accompanied by her daughter.  History obtained from both of them.  Reports increased leg pain.  Has been having back pain previously.  Previous MRI revealed lumbar spondylosis/DDD with impingement L-4 and L5-S1.  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  Last check had recommended f/u in 6 months.  No chest pain.  Breathing is stable.  Allergy symptoms appear to be controlled.  No sob.  No acid reflux.  No abdominal pain.  Bowels moving.  She is able to get up and move around her house.  Some fatigue.  Blood pressure elevated.  Daughter reports averaging 076-808 systolic readings.     Past Medical History:  Diagnosis Date   Asthma    Diabetes mellitus ([place])    Environmental allergies    Hypertension    Past Surgical History:  Procedure Laterality Date   APPENDECTOMY     APPLICATION OF WOUND VAC Right 05/14/2018   Procedure: APPLICATION OF WOUND VAC;  Surgeon: Benjamine Sprague, DO;  Location: [facility] OR[facility];  Service: General;  Laterality: Right;   INCISION AND DRAINAGE ABSCESS Right 05/14/2018   Procedure: INCISION AND DRAINAGE ABSCESS RIGHT GROIN;  Surgeon: Benjamine Sprague, DO;  Location: [facility];  Service: General;  Laterality: Right;   INGUINAL HERNIA REPAIR  03/09/2018   Procedure: HERNIA REPAIR INGUINAL INCARCERATED;  Surgeon: Benjamine Sprague, DO;  Location: [facility];  Service: General;;   INSERTION OF MESH  03/09/2018   Procedure: INSERTION OF MESH;  Surgeon: Benjamine Sprague, DO;  Location: [facility];  Service: General;;   LAPAROSCOPY N/A 03/09/2018   Procedure: LAPAROSCOPY DIAGNOSTIC;  Surgeon: Benjamine Sprague, DO;  Location: [facility];  Service: General;  Laterality: N/A;   TUBAL LIGATION      Family History  Problem Relation Age of Onset   Hypertension Mother    Cancer Brother        lung (question)   Cancer Sister        questionable type   Social History   Socioeconomic History   Marital status: Widowed    Spouse name: Not on file   Number of children: Not on file   Years of education: Not on file   Highest education level: Not on file  Occupational History   Not on file  Social Needs   Financial resource strain: Not on file   Food insecurity    Worry: Not on file    Inability: Not on file   Transportation needs    Medical: Not on file    Non-medical: Not on file  Tobacco Use   Smoking status: Never Smoker   Smokeless tobacco: Never Used  Substance and Sexual Activity   Alcohol use: No    Alcohol/week: 0.0 standard drinks   Drug use: No   Sexual activity: Not on file  Lifestyle   Physical activity    Days per week: Not on file    Minutes per session: Not on file   Stress: Not on file  Relationships   Social connections    Talks on phone: Not on file    Gets together: Not on file  Attends religious service: Not on file    Active member of club or organization: Not on file    Attends meetings of clubs or organizations: Not on file    Relationship status: Not on file  Other Topics Concern   Not on file  Social History Narrative   Not on file    Outpatient Encounter Medications as of 05/25/2019  Medication Sig   acetaminophen (TYLENOL) 325 MG tablet Take 650 mg by mouth every 6 (six) hours as needed for mild pain.    amLODipine (NORVASC) 10 MG tablet TAKE 1 TABLET BY MOUTH EVERY DAY   aspirin (ASPIRIN 81) 81 MG EC tablet Take 81 mg by mouth daily. Swallow whole.   Calcium Carbonate-Vitamin D (CALTRATE 600+D PO) Take 1 tablet by mouth daily.    cloNIDine (CATAPRES) 0.1 MG tablet Take 1 tablet (0.1 mg total) by mouth 3 (three) times daily.   fexofenadine (ALLEGRA) 180 MG tablet TAKE 1 TABLET BY MOUTH EVERY DAY    glucose blood (ACCU-CHEK AVIVA PLUS) test strip CHECK SUGAR ONCE DAILY   hydrALAZINE (APRESOLINE) 100 MG tablet TAKE 1 TABLET (100 MG TOTAL) BY MOUTH 3 (THREE) TIMES DAILY.   labetalol (NORMODYNE) 100 MG tablet TAKE 1 TABLET BY MOUTH TWICE A DAY   SYNTHROID 50 MCG tablet TAKE 1 TABLET BY MOUTH EVERY DAY   [DISCONTINUED] famotidine (PEPCID) 10 MG tablet Take 1 tablet (10 mg total) by mouth 2 (two) times daily.   No facility-administered encounter medications on file as of 05/25/2019.    Review of Systems  Constitutional: Positive for fatigue. Negative for appetite change.       Decreased weight.    HENT: Negative for congestion and sinus pressure.   Respiratory: Negative for cough and chest tightness.        Breathing stable.   Cardiovascular: Negative for chest pain, palpitations and leg swelling.  Gastrointestinal: Negative for abdominal pain, diarrhea, nausea and vomiting.  Genitourinary: Negative for difficulty urinating and dysuria.  Musculoskeletal: Positive for back pain.       Leg pain as outlined.   Skin: Negative for color change and rash.  Neurological: Negative for dizziness, light-headedness and headaches.  Psychiatric/Behavioral: Negative for agitation and dysphoric mood.       Objective:    Physical Exam Constitutional:      General: She is not in acute distress.    Appearance: Normal appearance.  HENT:     Head: Normocephalic and atraumatic.     Right Ear: External ear normal.     Left Ear: External ear normal.  Eyes:     General: No scleral icterus.       Right eye: No discharge.        Left eye: No discharge.     Conjunctiva/sclera: Conjunctivae normal.  Neck:     Musculoskeletal: Neck supple. No muscular tenderness.     Thyroid: No thyromegaly.  Cardiovascular:     Rate and Rhythm: Normal rate and regular rhythm.  Pulmonary:     Effort: No respiratory distress.     Breath sounds: Normal breath sounds. No wheezing.  Abdominal:     General: Bowel  sounds are normal.     Palpations: Abdomen is soft.     Tenderness: There is no abdominal tenderness.  Musculoskeletal:        General: No swelling or tenderness.  Lymphadenopathy:     Cervical: No cervical adenopathy.  Skin:    Findings: No erythema or rash.  Neurological:  Mental Status: She is alert.  Psychiatric:        Mood and Affect: Mood normal.        Behavior: Behavior normal.     BP (!) 180/82    Pulse 76    Temp 98 F (36.7 C)    Resp 16    Wt 82 lb (37.2 kg)    SpO2 98%    BMI 17.14 kg/m  Wt Readings from Last 3 Encounters:  05/25/19 82 lb (37.2 kg)  11/08/18 86 lb 3.2 oz (39.1 kg)  07/29/18 85 lb 3.2 oz (38.6 kg)     Lab Results  Component Value Date   WBC 4.7 05/25/2019   HGB 9.9 (L) 05/25/2019   HCT 30.4 (L) 05/25/2019   PLT 244.0 05/25/2019   GLUCOSE 104 (H) 05/25/2019   CHOL 261 (H) 05/25/2019   TRIG 178.0 (H) 05/25/2019   HDL 109.50 05/25/2019   LDLDIRECT 107.1 07/29/2013   LDLCALC 116 (H) 05/25/2019   ALT 8 05/25/2019   AST 18 05/25/2019   NA 139 05/25/2019   K 4.5 05/25/2019   CL 108 05/25/2019   CREATININE 2.01 (H) 05/25/2019   BUN 62 (H) 05/25/2019   CO2 18 (L) 05/25/2019   TSH 1.81 11/08/2018   INR 1.0 01/25/2013   HGBA1C 5.0 05/25/2019   MICROALBUR 115.0 Repeated and verified X2. (H) 07/29/2013    Ct Head Wo Contrast  Result Date: 06/12/2018 CLINICAL DATA:  Swelling to left cheek and pain two right knee. Denies loss of consciousness. EXAM: CT HEAD WITHOUT CONTRAST CT MAXILLOFACIAL WITHOUT CONTRAST TECHNIQUE: Multidetector CT imaging of the head and maxillofacial structures were performed using the standard protocol without intravenous contrast. Multiplanar CT image reconstructions of the maxillofacial structures were also generated. COMPARISON:  CT head 07/08/2014 FINDINGS: CT HEAD FINDINGS Brain: No evidence of acute infarction, hemorrhage, hydrocephalus, extra-axial collection or mass lesion/mass effect. There is mild diffuse  low-attenuation within the subcortical and periventricular white matter compatible with chronic microvascular disease. Prominence of the sulci and ventricles noted. Vascular: No hyperdense vessel or unexpected calcification. Skull: Normal. Negative for fracture or focal lesion. Other: None. CT MAXILLOFACIAL FINDINGS Osseous: No fracture or mandibular dislocation. No destructive process. Orbits: Negative. No traumatic or inflammatory finding. Sinuses: Clear. Soft tissues: Hematoma along the left side of face is identified, image 38/6. IMPRESSION: 1. No acute intracranial abnormalities. 2. Chronic small vessel ischemic disease and brain atrophy. 3. No evidence for facial bone fracture. 4. Electronically Signed   By: Kerby Moors M.D.   On: 06/12/2018 20:10   Dg Knee Complete 4 Views Right  Result Date: 06/12/2018 CLINICAL DATA:  Right knee pain after fall. EXAM: RIGHT KNEE - COMPLETE 4+ VIEW COMPARISON:  CT right knee dated November 01, 2017. Right knee x-rays dated October 30, 2017. FINDINGS: No acute fracture or dislocation. Moderate to large joint effusion. Mild progressive lateral compartment degenerative changes. Osteopenia. Vascular calcifications. IMPRESSION: 1. No acute osseous abnormality. Persistent moderate to large joint effusion. 2. Mildly progressive lateral compartment degenerative changes. Electronically Signed   By: Titus Dubin M.D.   On: 06/12/2018 20:19   Ct Maxillofacial Wo Contrast  Result Date: 06/12/2018 CLINICAL DATA:  Swelling to left cheek and pain two right knee. Denies loss of consciousness. EXAM: CT HEAD WITHOUT CONTRAST CT MAXILLOFACIAL WITHOUT CONTRAST TECHNIQUE: Multidetector CT imaging of the head and maxillofacial structures were performed using the standard protocol without intravenous contrast. Multiplanar CT image reconstructions of the maxillofacial structures were also generated. COMPARISON:  CT head 07/08/2014 FINDINGS: CT HEAD FINDINGS Brain: No evidence of acute  infarction, hemorrhage, hydrocephalus, extra-axial collection or mass lesion/mass effect. There is mild diffuse low-attenuation within the subcortical and periventricular white matter compatible with chronic microvascular disease. Prominence of the sulci and ventricles noted. Vascular: No hyperdense vessel or unexpected calcification. Skull: Normal. Negative for fracture or focal lesion. Other: None. CT MAXILLOFACIAL FINDINGS Osseous: No fracture or mandibular dislocation. No destructive process. Orbits: Negative. No traumatic or inflammatory finding. Sinuses: Clear. Soft tissues: Hematoma along the left side of face is identified, image 38/6. IMPRESSION: 1. No acute intracranial abnormalities. 2. Chronic small vessel ischemic disease and brain atrophy. 3. No evidence for facial bone fracture. 4. Electronically Signed   By: Kerby Moors M.D.   On: 06/12/2018 20:10       Assessment & Plan:   Problem List Items Addressed This Visit    Allergic rhinitis    Controlled on current regimen.  Follow.        Anemia    Recheck cbc.  Will also check SPEP given pain, anemia and renal insuf.        Relevant Orders   CBC with Differential/Platelet (Completed)   Ferritin (Completed)   Protein Electrophoresis, (serum) (Completed)   B12 deficiency   Decreased GFR    Recheck metabolic panel today.  Avoid antiinflammatories.        Diabetes mellitus with peripheral vascular disease ([place])    Sugars have been doing well.  No low sugars.  Follow met b and a1c.        Relevant Medications   cloNIDine (CATAPRES) 0.1 MG tablet   Other Relevant Orders   Hemoglobin A1c (Completed)   Elevated serum creatinine    Avoid antiinflammatories.  Recheck metabolic panel.       Relevant Orders   VAS US RENAL ARTERY DUPLEX   US RENAL ARTERY DUPLEX COMPLETE (Completed)   Hypercholesteremia    Follow lipid panel.        Relevant Medications   cloNIDine (CATAPRES) 0.1 MG tablet   Other Relevant Orders    Hepatic function panel (Completed)   Lipid panel (Completed)   Hypertension    Poorly controlled blood pressure with significant elevation. Unable to take ace inhibitor and ARB due to angioedema.  On labetolol, hydralazine and amlodipine.  Will add clonidine .9m tid.  Follow pressures.  Check metabolic panel given significant elevation.  Concern regarding possible renal artery stenosis.  Discussed possible need for vascular duplex and renal ultrasound.        Relevant Medications   cloNIDine (CATAPRES) 0.1 MG tablet   Other Relevant Orders   Basic metabolic panel (Completed)   VAS [place] ART[place] DUPLEX   [place] ART[place] DUP[place] COMPLETE (Completed)   Hypothyroidism    On synthroid.  Follow tsh.       Loss of weight    Encourage increased po intake. Discussed nutritional shakes.  Follow.        Peripheral vascular disease ([facility])    Leg pain.  Has been evaluated by vascular surgery.  Popliteal stenosis as outlined.  Appears to be more msk.  Has history of DDD and spondylosis - lumbar region.  Tylenol.  Lidocaine patches.  Avoid antiinflammatories.        Relevant Medications   cloNIDine (CATAPRES) 0.1 MG tablet    Other Visit Diagnoses    Need for immunization against influenza       Relevant Orders   Flu Vaccine QUAD  High Dose(Fluad) (Completed)       Einar Pheasant, MD

## 2019-05-26 ENCOUNTER — Telehealth: Payer: Self-pay

## 2019-05-26 DIAGNOSIS — R944 Abnormal results of kidney function studies: Secondary | ICD-10-CM | POA: Insufficient documentation

## 2019-05-26 DIAGNOSIS — R7989 Other specified abnormal findings of blood chemistry: Secondary | ICD-10-CM | POA: Insufficient documentation

## 2019-05-26 MED ORDER — CLONIDINE HCL 0.1 MG PO TABS: 0.1000 mg | ORAL_TABLET | Freq: Three times a day (TID) | ORAL | 2 refills | Status: DC

## 2019-05-26 NOTE — Assessment & Plan Note (Addendum)
Poorly controlled blood pressure with significant elevation. Unable to take ace inhibitor and ARB due to angioedema.  On labetolol, hydralazine and amlodipine.  Will add clonidine .1mg  tid.  Follow pressures.  Check metabolic panel given significant elevation.  Concern regarding possible renal artery stenosis.  Discussed possible need for vascular duplex and renal ultrasound.

## 2019-05-26 NOTE — Telephone Encounter (Signed)
Copied from [place] [phone]. Topic: General - Inquiry >> May 26, 2019  4:29 PM Alease Frame wrote: Reason for CRM: Caller: Abby From: [facility] Ultrasound Call number [phone] Wanted to make the Dr did want to rule out RAS

## 2019-05-27 ENCOUNTER — Other Ambulatory Visit: Payer: Self-pay

## 2019-05-27 ENCOUNTER — Ambulatory Visit
Admission: RE | Admit: 2019-05-27 | Discharge: 2019-05-27 | Disposition: A | Discharge status: Home / Self Care | Payer: Medicare Other | Source: Ambulatory Visit | Attending: Internal Medicine | Admitting: Internal Medicine

## 2019-05-27 DIAGNOSIS — I700 Atherosclerosis of aorta: Secondary | ICD-10-CM | POA: Diagnosis not present

## 2019-05-27 DIAGNOSIS — R7989 Other specified abnormal findings of blood chemistry: Secondary | ICD-10-CM | POA: Insufficient documentation

## 2019-05-27 DIAGNOSIS — I10 Essential (primary) hypertension: Secondary | ICD-10-CM | POA: Insufficient documentation

## 2019-05-27 DIAGNOSIS — N2889 Other specified disorders of kidney and ureter: Secondary | ICD-10-CM | POA: Insufficient documentation

## 2019-05-27 LAB — PROTEIN ELECTROPHORESIS, SERUM
Albumin ELP: 4 g/dL (ref 3.8–4.8)
Alpha 1: 0.4 g/dL — ABNORMAL HIGH (ref 0.2–0.3)
Alpha 2: 0.8 g/dL (ref 0.5–0.9)
Beta 2: 0.4 g/dL (ref 0.2–0.5)
Beta Globulin: 0.5 g/dL (ref 0.4–0.6)
Gamma Globulin: 1.5 g/dL (ref 0.8–1.7)
Total Protein: 7.5 g/dL (ref 6.1–8.1)

## 2019-05-27 NOTE — Telephone Encounter (Signed)
Discussed with Abby.  Need renal ultrasound and renal artery duplex.

## 2019-05-29 ENCOUNTER — Encounter: Payer: Self-pay | Admitting: Internal Medicine

## 2019-05-29 NOTE — Assessment & Plan Note (Signed)
Leg pain.  Has been evaluated by vascular surgery.  Popliteal stenosis as outlined.  Appears to be more msk.  Has history of DDD and spondylosis - lumbar region.  Tylenol.  Lidocaine patches.  Avoid antiinflammatories.

## 2019-05-29 NOTE — Assessment & Plan Note (Signed)
Avoid antiinflammatories.  Recheck metabolic panel.  

## 2019-05-29 NOTE — Assessment & Plan Note (Signed)
Recheck metabolic panel today.  Avoid antiinflammatories.

## 2019-05-29 NOTE — Assessment & Plan Note (Signed)
Follow lipid panel.   

## 2019-05-29 NOTE — Assessment & Plan Note (Signed)
Sugars have been doing well.  No low sugars.  Follow met b and a1c.

## 2019-05-29 NOTE — Assessment & Plan Note (Signed)
On synthroid.  Follow tsh.   

## 2019-05-29 NOTE — Assessment & Plan Note (Signed)
Encourage increased po intake. Discussed nutritional shakes.  Follow.

## 2019-05-29 NOTE — Assessment & Plan Note (Signed)
Recheck cbc.  Will also check SPEP given pain, anemia and renal insuf.

## 2019-05-29 NOTE — Assessment & Plan Note (Signed)
Controlled on current regimen.  Follow.  

## 2019-06-01 ENCOUNTER — Other Ambulatory Visit: Payer: Self-pay | Admitting: Internal Medicine

## 2019-06-01 DIAGNOSIS — N184 Chronic kidney disease, stage 4 (severe): Secondary | ICD-10-CM

## 2019-06-01 NOTE — Progress Notes (Signed)
Order placed for nephrology referral.   °

## 2019-06-07 ENCOUNTER — Telehealth: Payer: Self-pay | Admitting: *Deleted

## 2019-06-07 NOTE — Telephone Encounter (Signed)
Copied from [place] [id number]. Topic: Quick Communication - Lab Results (Clinic Use ONLY) >> Jun 06, 2019  1:44 PM Lars Masson, LPN wrote: Called patient to inform them of lab results. When patient returns call, triage nurse may disclose results. >> Jun 07, 2019 10:22 AM Sheran Luz wrote: Patient's daughter returning call for lab results (not with patient at time of call and cannot find DPR).

## 2019-06-09 ENCOUNTER — Telehealth: Payer: Self-pay | Admitting: *Deleted

## 2019-06-09 DIAGNOSIS — D649 Anemia, unspecified: Secondary | ICD-10-CM

## 2019-06-09 DIAGNOSIS — N184 Chronic kidney disease, stage 4 (severe): Secondary | ICD-10-CM

## 2019-06-09 NOTE — Telephone Encounter (Signed)
Please place future orders for lab appt.  

## 2019-06-10 ENCOUNTER — Other Ambulatory Visit (INDEPENDENT_AMBULATORY_CARE_PROVIDER_SITE_OTHER): Payer: Medicare Other

## 2019-06-10 ENCOUNTER — Other Ambulatory Visit: Payer: Self-pay

## 2019-06-10 DIAGNOSIS — N184 Chronic kidney disease, stage 4 (severe): Secondary | ICD-10-CM

## 2019-06-10 DIAGNOSIS — D649 Anemia, unspecified: Secondary | ICD-10-CM | POA: Diagnosis not present

## 2019-06-10 NOTE — Telephone Encounter (Signed)
Orders placed for labs

## 2019-06-10 NOTE — Addendum Note (Signed)
Addended by: Leeanne Rio on: 06/10/2019 02:44 PM   Modules accepted: Orders

## 2019-06-14 LAB — IMMUNOFIXATION ELECTROPHORESIS
IgG (Immunoglobin G), Serum: 1319 mg/dL (ref 600–1540)
IgM, Serum: 448 mg/dL — ABNORMAL HIGH (ref 50–300)
Immunoglobulin A: 306 mg/dL (ref 70–320)

## 2019-06-14 LAB — CBC WITH DIFFERENTIAL/PLATELET
Absolute Monocytes: 684 cells/uL (ref 200–950)
Basophils Absolute: 58 cells/uL (ref 0–200)
Basophils Relative: 0.8 %
Eosinophils Absolute: 252 cells/uL (ref 15–500)
Eosinophils Relative: 3.5 %
HCT: 26 % — ABNORMAL LOW (ref 35.0–45.0)
Hemoglobin: 8.5 g/dL — ABNORMAL LOW (ref 11.7–15.5)
Lymphs Abs: 2347 cells/uL (ref 850–3900)
MCH: 31.6 pg (ref 27.0–33.0)
MCHC: 32.7 g/dL (ref 32.0–36.0)
MCV: 96.7 fL (ref 80.0–100.0)
MPV: 10.4 fL (ref 7.5–12.5)
Monocytes Relative: 9.5 %
Neutro Abs: 3859 cells/uL (ref 1500–7800)
Neutrophils Relative %: 53.6 %
Platelets: 250 10*3/uL (ref 140–400)
RBC: 2.69 10*6/uL — ABNORMAL LOW (ref 3.80–5.10)
RDW: 13.9 % (ref 11.0–15.0)
Total Lymphocyte: 32.6 %
WBC: 7.2 10*3/uL (ref 3.8–10.8)

## 2019-06-14 LAB — BASIC METABOLIC PANEL
BUN/Creatinine Ratio: 30 (calc) — ABNORMAL HIGH (ref 6–22)
BUN: 73 mg/dL — ABNORMAL HIGH (ref 7–25)
CO2: 15 mmol/L — ABNORMAL LOW (ref 20–32)
Calcium: 9 mg/dL (ref 8.6–10.4)
Chloride: 111 mmol/L — ABNORMAL HIGH (ref 98–110)
Creat: 2.42 mg/dL — ABNORMAL HIGH (ref 0.60–0.88)
Glucose, Bld: 100 mg/dL — ABNORMAL HIGH (ref 65–99)
Potassium: 5 mmol/L (ref 3.5–5.3)
Sodium: 141 mmol/L (ref 135–146)

## 2019-06-15 DIAGNOSIS — E1151 Type 2 diabetes mellitus with diabetic peripheral angiopathy without gangrene: Secondary | ICD-10-CM | POA: Diagnosis not present

## 2019-06-15 DIAGNOSIS — M25571 Pain in right ankle and joints of right foot: Secondary | ICD-10-CM | POA: Diagnosis not present

## 2019-06-16 ENCOUNTER — Telehealth: Payer: Self-pay | Admitting: *Deleted

## 2019-06-16 ENCOUNTER — Other Ambulatory Visit: Payer: Self-pay

## 2019-06-16 NOTE — Telephone Encounter (Signed)
Please place future orders for lab appt.  

## 2019-06-17 ENCOUNTER — Other Ambulatory Visit: Payer: Self-pay | Admitting: Internal Medicine

## 2019-06-17 DIAGNOSIS — N185 Chronic kidney disease, stage 5: Secondary | ICD-10-CM

## 2019-06-17 DIAGNOSIS — D631 Anemia in chronic kidney disease: Secondary | ICD-10-CM

## 2019-06-17 DIAGNOSIS — N184 Chronic kidney disease, stage 4 (severe): Secondary | ICD-10-CM

## 2019-06-17 NOTE — Telephone Encounter (Signed)
Orders placed for labs

## 2019-06-17 NOTE — Progress Notes (Signed)
Orders placed for labs

## 2019-06-20 ENCOUNTER — Other Ambulatory Visit (INDEPENDENT_AMBULATORY_CARE_PROVIDER_SITE_OTHER): Payer: Medicare Other

## 2019-06-20 ENCOUNTER — Other Ambulatory Visit: Payer: Self-pay

## 2019-06-20 DIAGNOSIS — N185 Chronic kidney disease, stage 5: Secondary | ICD-10-CM

## 2019-06-20 DIAGNOSIS — N184 Chronic kidney disease, stage 4 (severe): Secondary | ICD-10-CM | POA: Diagnosis not present

## 2019-06-20 DIAGNOSIS — D631 Anemia in chronic kidney disease: Secondary | ICD-10-CM | POA: Diagnosis not present

## 2019-06-20 LAB — CBC WITH DIFFERENTIAL/PLATELET
Basophils Absolute: 0 10*3/uL (ref 0.0–0.1)
Basophils Relative: 0.7 % (ref 0.0–3.0)
Eosinophils Absolute: 0.2 10*3/uL (ref 0.0–0.7)
Eosinophils Relative: 4.1 % (ref 0.0–5.0)
HCT: 26.4 % — ABNORMAL LOW (ref 36.0–46.0)
Hemoglobin: 8.8 g/dL — ABNORMAL LOW (ref 12.0–15.0)
Lymphocytes Relative: 26.7 % (ref 12.0–46.0)
Lymphs Abs: 1.3 10*3/uL (ref 0.7–4.0)
MCHC: 33.3 g/dL (ref 30.0–36.0)
MCV: 95.7 fl (ref 78.0–100.0)
Monocytes Absolute: 0.4 10*3/uL (ref 0.1–1.0)
Monocytes Relative: 8.5 % (ref 3.0–12.0)
Neutro Abs: 3 10*3/uL (ref 1.4–7.7)
Neutrophils Relative %: 60 % (ref 43.0–77.0)
Platelets: 232 10*3/uL (ref 150.0–400.0)
RBC: 2.75 Mil/uL — ABNORMAL LOW (ref 3.87–5.11)
RDW: 15.5 % (ref 11.5–15.5)
WBC: 4.9 10*3/uL (ref 4.0–10.5)

## 2019-06-20 LAB — BASIC METABOLIC PANEL
BUN: 57 mg/dL — ABNORMAL HIGH (ref 6–23)
CO2: 20 mEq/L (ref 19–32)
Calcium: 8.6 mg/dL (ref 8.4–10.5)
Chloride: 109 mEq/L (ref 96–112)
Creatinine, Ser: 2.31 mg/dL — ABNORMAL HIGH (ref 0.40–1.20)
GFR: 23.51 mL/min — ABNORMAL LOW (ref >60.00)
Glucose, Bld: 194 mg/dL — ABNORMAL HIGH (ref 70–99)
Potassium: 4.1 mEq/L (ref 3.5–5.1)
Sodium: 138 mEq/L (ref 135–145)

## 2019-06-27 ENCOUNTER — Other Ambulatory Visit: Payer: Self-pay | Admitting: Internal Medicine

## 2019-07-06 ENCOUNTER — Ambulatory Visit (INDEPENDENT_AMBULATORY_CARE_PROVIDER_SITE_OTHER): Payer: Medicare Other | Admitting: Internal Medicine

## 2019-07-06 ENCOUNTER — Encounter: Payer: Self-pay | Admitting: Internal Medicine

## 2019-07-06 ENCOUNTER — Other Ambulatory Visit: Payer: Self-pay

## 2019-07-06 DIAGNOSIS — E1151 Type 2 diabetes mellitus with diabetic peripheral angiopathy without gangrene: Secondary | ICD-10-CM | POA: Diagnosis not present

## 2019-07-06 DIAGNOSIS — N185 Chronic kidney disease, stage 5: Secondary | ICD-10-CM | POA: Diagnosis not present

## 2019-07-06 DIAGNOSIS — D631 Anemia in chronic kidney disease: Secondary | ICD-10-CM

## 2019-07-06 DIAGNOSIS — E034 Atrophy of thyroid (acquired): Secondary | ICD-10-CM | POA: Diagnosis not present

## 2019-07-06 DIAGNOSIS — R600 Localized edema: Secondary | ICD-10-CM | POA: Diagnosis not present

## 2019-07-06 DIAGNOSIS — I10 Essential (primary) hypertension: Secondary | ICD-10-CM | POA: Diagnosis not present

## 2019-07-06 DIAGNOSIS — I739 Peripheral vascular disease, unspecified: Secondary | ICD-10-CM

## 2019-07-06 DIAGNOSIS — N184 Chronic kidney disease, stage 4 (severe): Secondary | ICD-10-CM

## 2019-07-06 NOTE — Progress Notes (Signed)
Patient ID: Tara [name], female   DOB: 1917-11-29, 85 y.o.   MRN: [id number]   Virtual Visit via telephone Note  This visit type was conducted due to national recommendations for restrictions regarding the COVID-19 pandemic (e.g. social distancing).  This format is felt to be most appropriate for this patient at this time.  All issues noted in this document were discussed and addressed.  No physical exam was performed (except for noted visual exam findings with Video Visits).   I connected with Naiyah Mackintosh by a video enabled telemedicine application or telephone and verified that I am speaking with the correct person using two identifiers. Location patient: home Location provider: work  Persons participating in the telephone visit: patient, provider and pts daughter.  I discussed the limitations, risks, security and privacy concerns of performing an evaluation and management service by telephone and the availability of in person appointments.  The patient expressed understanding and agreed to proceed.   Reason for visit: scheduled follow up  HPI: She has been having pain - back and legs. Saw ortho for her ankle.  Using salanpas.  Taking tylenol.  Helping some.  Discussed to avoid antiinflammatories.  Blood pressure remaining [id number] systolic range.  tahking her medication.  Discussed the need to stay hydrated.  She is eating.  Some decreased appetite.  Discussed adding nutritional shakes.  No vomiting.  No abdominal pain.  Bowels moving.  Has appt with nephrology next week.  Discussed again recent labs.  Renal function stabilized last check, but decreased from prior checks.  Discussed f/u labs.     ROS: See pertinent positives and negatives per HPI.  Past Medical History:  Diagnosis Date  . Asthma   . Diabetes mellitus ([place])   . Environmental allergies   . Hypertension     Past Surgical History:  Procedure Laterality Date  . APPENDECTOMY    . APPLICATION OF WOUND VAC Right  05/14/2018   Procedure: APPLICATION OF WOUND VAC;  Surgeon: Benjamine Sprague, DO;  Location: [facility] OR[facility];  Service: General;  Laterality: Right;  . INCISION AND DRAINAGE ABSCESS Right 05/14/2018   Procedure: INCISION AND DRAINAGE ABSCESS RIGHT GROIN;  Surgeon: Benjamine Sprague, DO;  Location: [facility] OR[facility];  Service: General;  Laterality: Right;  . INGUINAL HERNIA REPAIR  03/09/2018   Procedure: HERNIA REPAIR INGUINAL INCARCERATED;  Surgeon: Benjamine Sprague, DO;  Location: [facility] OR[facility];  Service: General;;  . INSERTION OF MESH  03/09/2018   Procedure: INSERTION OF MESH;  Surgeon: Benjamine Sprague, DO;  Location: [facility];  Service: General;;  . LAPAROSCOPY N/A 03/09/2018   Procedure: LAPAROSCOPY DIAGNOSTIC;  Surgeon: Benjamine Sprague, DO;  Location: [facility] ORS;  Service: General;  Laterality: N/A;  . TUBAL LIGATION      Family History  Problem Relation Age of Onset  . Hypertension Mother   . Cancer Brother        lung (question)  . Cancer Sister        questionable type    SOCIAL HX: reviewed.    Current Outpatient Medications:  .  acetaminophen (TYLENOL) 325 MG tablet, Take 650 mg by mouth every 6 (six) hours as needed for mild pain. , Disp: , Rfl:  .  amLODipine (NORVASC) 10 MG tablet, TAKE 1 TABLET BY MOUTH EVERY DAY, Disp: 90 tablet, Rfl: 1 .  aspirin (ASPIRIN 81) 81 MG EC tablet, Take 81 mg by mouth daily. Swallow whole., Disp: , Rfl:  .  Calcium Carbonate-Vitamin D (CALTRATE 600+D PO), Take 1 tablet  by mouth daily. , Disp: , Rfl:  .  cloNIDine (CATAPRES) 0.1 MG tablet, Take 1 tablet (0.1 mg total) by mouth 3 (three) times daily., Disp: 90 tablet, Rfl: 2 .  fexofenadine (ALLEGRA) 180 MG tablet, TAKE 1 TABLET BY MOUTH EVERY DAY, Disp: 90 tablet, Rfl: 1 .  glucose blood (ACCU-CHEK AVIVA PLUS) test strip, CHECK SUGAR ONCE DAILY, Disp: 100 each, Rfl: 6 .  hydrALAZINE (APRESOLINE) 100 MG tablet, TAKE 1 TABLET (100 MG TOTAL) BY MOUTH 3 (THREE) TIMES DAILY., Disp: 270 tablet, Rfl: 1 .  labetalol (NORMODYNE) 100 MG tablet,  TAKE 1 TABLET BY MOUTH TWICE A DAY, Disp: 180 tablet, Rfl: 1 .  SYNTHROID 50 MCG tablet, TAKE 1 TABLET BY MOUTH EVERY DAY, Disp: 90 tablet, Rfl: 1  EXAM:  GENERAL: alert. Sounds to be in no acute distress.  Answering questions appropriately.    PSYCH/NEURO: pleasant and cooperative, no obvious depression or anxiety, speech and thought processing grossly intact  ASSESSMENT AND PLAN:  Discussed the following assessment and plan:  Anemia hgb stabilized 8.8 last check.  B12 and iron stores wnl.  With worsening renal function.  Needs f/u cbc.   Bilateral lower extremity edema Off lasix.  Stable per her report.    Diabetes mellitus with peripheral vascular disease ([place]) Sugars have been doing well on no medication.  Follow.    Hypertension Blood pressure elevated, but improved.  Continue hydralazine, clonidine, labetalol and amlodipine.  Recent vascular duplex negative.  Keep f/u with nephrology.    Hypothyroidism On thyroid replacement.  Follow tsh.   Peripheral vascular disease Has been evaluated by [facility]. Has popliteal stenosis. Continue aspirin.   CKD (chronic kidney disease) stage 4, GFR 15-29 ml/min ([facility]) Recent worsening renal function.  Renal ultrasound revealed no hydronephrosis.  Renal duplex - no evidence of renal artery stenosis.  Off lasix.  Discussed staying hydrated.  Keep f/u with nephrology next week.      I discussed the assessment and treatment plan with the patient. The patient was provided an opportunity to ask questions and all were answered. The patient agreed with the plan and demonstrated an understanding of the instructions.   The patient was advised to call back or seek an in-person evaluation if the symptoms worsen or if the condition fails to improve as anticipated.   Einar Pheasant, MD

## 2019-07-08 ENCOUNTER — Other Ambulatory Visit: Payer: Self-pay | Admitting: Internal Medicine

## 2019-07-09 ENCOUNTER — Encounter: Payer: Self-pay | Admitting: Internal Medicine

## 2019-07-09 DIAGNOSIS — N184 Chronic kidney disease, stage 4 (severe): Secondary | ICD-10-CM | POA: Insufficient documentation

## 2019-07-09 NOTE — Assessment & Plan Note (Signed)
Has been evaluated by [facility]. Has popliteal stenosis. Continue aspirin.

## 2019-07-09 NOTE — Assessment & Plan Note (Signed)
Sugars have been doing well on no medication.  Follow.

## 2019-07-09 NOTE — Assessment & Plan Note (Signed)
Off lasix.  Stable per her report.

## 2019-07-09 NOTE — Assessment & Plan Note (Signed)
hgb stabilized 8.8 last check.  B12 and iron stores wnl.  With worsening renal function.  Needs f/u cbc.

## 2019-07-09 NOTE — Assessment & Plan Note (Signed)
On thyroid replacement.  Follow tsh.  

## 2019-07-09 NOTE — Assessment & Plan Note (Signed)
Blood pressure elevated, but improved.  Continue hydralazine, clonidine, labetalol and amlodipine.  Recent vascular duplex negative.  Keep f/u with nephrology.

## 2019-07-09 NOTE — Assessment & Plan Note (Signed)
Recent worsening renal function.  Renal ultrasound revealed no hydronephrosis.  Renal duplex - no evidence of renal artery stenosis.  Off lasix.  Discussed staying hydrated.  Keep f/u with nephrology next week.

## 2019-07-14 DIAGNOSIS — R82998 Other abnormal findings in urine: Secondary | ICD-10-CM | POA: Diagnosis not present

## 2019-07-14 DIAGNOSIS — I10 Essential (primary) hypertension: Secondary | ICD-10-CM | POA: Diagnosis not present

## 2019-07-14 DIAGNOSIS — N1832 Chronic kidney disease, stage 3b: Secondary | ICD-10-CM | POA: Diagnosis not present

## 2019-07-14 DIAGNOSIS — N179 Acute kidney failure, unspecified: Secondary | ICD-10-CM | POA: Diagnosis not present

## 2019-07-14 DIAGNOSIS — R808 Other proteinuria: Secondary | ICD-10-CM | POA: Diagnosis not present

## 2019-07-25 DIAGNOSIS — E119 Type 2 diabetes mellitus without complications: Secondary | ICD-10-CM | POA: Diagnosis not present

## 2019-07-25 LAB — HM DIABETES EYE EXAM

## 2019-07-27 ENCOUNTER — Encounter: Payer: Self-pay | Admitting: Internal Medicine

## 2019-07-30 ENCOUNTER — Other Ambulatory Visit: Payer: Self-pay | Admitting: Internal Medicine

## 2019-08-24 DIAGNOSIS — N184 Chronic kidney disease, stage 4 (severe): Secondary | ICD-10-CM | POA: Diagnosis not present

## 2019-08-24 DIAGNOSIS — R808 Other proteinuria: Secondary | ICD-10-CM | POA: Diagnosis not present

## 2019-08-24 DIAGNOSIS — I10 Essential (primary) hypertension: Secondary | ICD-10-CM | POA: Diagnosis not present

## 2019-08-24 DIAGNOSIS — R600 Localized edema: Secondary | ICD-10-CM | POA: Diagnosis not present

## 2019-09-11 ENCOUNTER — Other Ambulatory Visit: Payer: Self-pay | Admitting: Internal Medicine

## 2019-09-21 ENCOUNTER — Ambulatory Visit (INDEPENDENT_AMBULATORY_CARE_PROVIDER_SITE_OTHER): Payer: Medicare Other | Admitting: Internal Medicine

## 2019-09-21 ENCOUNTER — Other Ambulatory Visit: Payer: Self-pay

## 2019-09-21 ENCOUNTER — Encounter: Payer: Self-pay | Admitting: Internal Medicine

## 2019-09-21 DIAGNOSIS — J309 Allergic rhinitis, unspecified: Secondary | ICD-10-CM

## 2019-09-21 DIAGNOSIS — K5909 Other constipation: Secondary | ICD-10-CM

## 2019-09-21 DIAGNOSIS — I10 Essential (primary) hypertension: Secondary | ICD-10-CM

## 2019-09-21 DIAGNOSIS — I739 Peripheral vascular disease, unspecified: Secondary | ICD-10-CM

## 2019-09-21 DIAGNOSIS — N184 Chronic kidney disease, stage 4 (severe): Secondary | ICD-10-CM

## 2019-09-21 DIAGNOSIS — N185 Chronic kidney disease, stage 5: Secondary | ICD-10-CM

## 2019-09-21 DIAGNOSIS — E1151 Type 2 diabetes mellitus with diabetic peripheral angiopathy without gangrene: Secondary | ICD-10-CM | POA: Diagnosis not present

## 2019-09-21 DIAGNOSIS — D631 Anemia in chronic kidney disease: Secondary | ICD-10-CM

## 2019-09-21 DIAGNOSIS — E034 Atrophy of thyroid (acquired): Secondary | ICD-10-CM

## 2019-09-21 DIAGNOSIS — E7800 Pure hypercholesterolemia, unspecified: Secondary | ICD-10-CM

## 2019-09-21 NOTE — Progress Notes (Signed)
Patient ID: Tara [name], female   DOB: [birth year], 86 y.o.   MRN: [id number]   Virtual Visit via telephone Note  This visit type was conducted due to national recommendations for restrictions regarding the COVID-19 pandemic (e.g. social distancing).  This format is felt to be most appropriate for this patient at this time.  All issues noted in this document were discussed and addressed.  No physical exam was performed (except for noted visual exam findings with Video Visits).   I connected with Jalexis Donado by telephone and verified that I am speaking with the correct person using two identifiers. Location patient: home Location provider: work or home office Persons participating in the virtual visit: patient, provider and pts daughter.   I discussed the limitations, risks, security and privacy concerns of performing an evaluation and management service by telephone and the availability of in person appointments.  The patient expressed understanding and agreed to proceed.   Reason for visit: scheduled follow up.   HPI: Recently saw nephrology for worsening renal function.  Note reviewed.  Lasix added.  Blood pressure remaining elevated - 160-170/70-80.  Daughter reports that she is sleeping a lot.  Legs give her trouble - some pain.  Decreased appetite.  Gets full fast.  Has Boost/Ensure.  Does not drink regularly.  Some nasal congestion and dryness.  Uses astelin. Discussed saline nasal spray.  No bowel movement this week.  Discussed taking miralax or stool stool softener daily.  Not able to get around as well, but is still walking some with her walker.  Discussed palliative care.  They are in agreement.     ROS: See pertinent positives and negatives per HPI.  Past Medical History:  Diagnosis Date  . Asthma   . Diabetes mellitus ([place])   . Environmental allergies   . Hypertension     Past Surgical History:  Procedure Laterality Date  . APPENDECTOMY    . APPLICATION OF WOUND VAC  Right 05/14/2018   Procedure: APPLICATION OF WOUND VAC;  Surgeon: Benjamine Sprague, DO;  Location: [facility];  Service: General;  Laterality: Right;  . INCISION AND DRAINAGE ABSCESS Right 05/14/2018   Procedure: INCISION AND DRAINAGE ABSCESS RIGHT GROIN;  Surgeon: Benjamine Sprague, DO;  Location: [facility];  Service: General;  Laterality: Right;  . INGUINAL HERNIA REPAIR  03/09/2018   Procedure: HERNIA REPAIR INGUINAL INCARCERATED;  Surgeon: Benjamine Sprague, DO;  Location: [facility];  Service: General;;  . INSERTION OF MESH  03/09/2018   Procedure: INSERTION OF MESH;  Surgeon: Benjamine Sprague, DO;  Location: [facility];  Service: General;;  . LAPAROSCOPY N/A 03/09/2018   Procedure: LAPAROSCOPY DIAGNOSTIC;  Surgeon: Benjamine Sprague, DO;  Location: [facility];  Service: General;  Laterality: N/A;  . TUBAL LIGATION      Family History  Problem Relation Age of Onset  . Hypertension Mother   . Cancer Brother        lung (question)  . Cancer Sister        questionable type    SOCIAL HX: reviewed.    Current Outpatient Medications:  .  ACCU-CHEK AVIVA PLUS test strip, CHECK SUGAR ONCE DAILY, Disp: 100 strip, Rfl: 6 .  acetaminophen (TYLENOL) 325 MG tablet, Take 650 mg by mouth every 6 (six) hours as needed for mild pain. , Disp: , Rfl:  .  amLODipine (NORVASC) 10 MG tablet, TAKE 1 TABLET BY MOUTH EVERY DAY, Disp: 90 tablet, Rfl: 1 .  aspirin (ASPIRIN 81) 81 MG EC tablet,  Take 81 mg by mouth daily. Swallow whole., Disp: , Rfl:  .  Calcium Carbonate-Vitamin D (CALTRATE 600+D PO), Take 1 tablet by mouth daily. , Disp: , Rfl:  .  cloNIDine (CATAPRES) 0.1 MG tablet, TAKE 1 TABLET BY MOUTH 3 TIMES DAILY., Disp: 270 tablet, Rfl: 0 .  fexofenadine (ALLEGRA) 180 MG tablet, TAKE 1 TABLET BY MOUTH EVERY DAY, Disp: 90 tablet, Rfl: 1 .  hydrALAZINE (APRESOLINE) 100 MG tablet, TAKE 1 TABLET (100 MG TOTAL) BY MOUTH 3 (THREE) TIMES DAILY., Disp: 270 tablet, Rfl: 1 .  labetalol (NORMODYNE) 100 MG tablet, TAKE 1 TABLET BY MOUTH TWICE A  DAY, Disp: 180 tablet, Rfl: 1 .  SYNTHROID 50 MCG tablet, TAKE 1 TABLET BY MOUTH EVERY DAY, Disp: 90 tablet, Rfl: 1  EXAM:  VITALS per patient if applicable: 250/03  GENERAL: alert.  Answering questions appropriately. Sounds to be in no cute distress.   PSYCH/NEURO: pleasant and cooperative, no obvious depression or anxiety, speech and thought processing grossly intact  ASSESSMENT AND PLAN:  Discussed the following assessment and plan:  Allergic rhinitis Saline nasal spray and astelin as directed.  Follow.   Anemia Follow cbc.  B12 and iron stores wnl.   Chronic constipation Start stool softener /miralax daily as outlined.  Follow.  Notify me if persistent problems.   CKD (chronic kidney disease) stage 4, GFR 15-29 ml/min (HCC) Seeing nephrology.  Lasix started.  Needs f/u metabolic panel.  Avoid antiinflammatories.    Diabetes mellitus with peripheral vascular disease ([place]) On no medication.  Follow met b and a1c.   Hypercholesteremia Follow lipid panel.   Hypertension Blood pressure as outlined.  Continue hydralazine and amlodipine.  Also on clonidine and nephrology recently started lasix.  Check metabolic panel.   Hypothyroidism On thyroid replacement.  Follow tsh.   Peripheral vascular disease Has been evaluated by [facility].  Has popliteal stenosis.  Could be contributing to leg pain. No ulceration.  Follow.     I discussed the assessment and treatment plan with the patient. The patient was provided an opportunity to ask questions and all were answered. The patient agreed with the plan and demonstrated an understanding of the instructions.   The patient was advised to call back or seek an in-person evaluation if the symptoms worsen or if the condition fails to improve as anticipated.  I provided 23 minutes of non-face-to-face time during this encounter.   Einar Pheasant, MD

## 2019-09-23 DIAGNOSIS — H903 Sensorineural hearing loss, bilateral: Secondary | ICD-10-CM | POA: Diagnosis not present

## 2019-09-23 DIAGNOSIS — H6983 Other specified disorders of Eustachian tube, bilateral: Secondary | ICD-10-CM | POA: Diagnosis not present

## 2019-09-23 DIAGNOSIS — R040 Epistaxis: Secondary | ICD-10-CM | POA: Diagnosis not present

## 2019-09-23 DIAGNOSIS — H6123 Impacted cerumen, bilateral: Secondary | ICD-10-CM | POA: Diagnosis not present

## 2019-09-25 ENCOUNTER — Encounter: Payer: Self-pay | Admitting: Internal Medicine

## 2019-09-25 NOTE — Assessment & Plan Note (Signed)
Saline nasal spray and astelin as directed.  Follow.

## 2019-09-25 NOTE — Assessment & Plan Note (Signed)
Seeing nephrology.  Lasix started.  Needs f/u metabolic panel.  Avoid antiinflammatories.

## 2019-09-25 NOTE — Assessment & Plan Note (Signed)
Follow lipid panel.   

## 2019-09-25 NOTE — Assessment & Plan Note (Signed)
On thyroid replacement.  Follow tsh.  

## 2019-09-25 NOTE — Assessment & Plan Note (Signed)
Blood pressure as outlined.  Continue hydralazine and amlodipine.  Also on clonidine and nephrology recently started lasix.  Check metabolic panel.

## 2019-09-25 NOTE — Assessment & Plan Note (Addendum)
Follow cbc.  B12 and iron stores wnl.

## 2019-09-25 NOTE — Assessment & Plan Note (Signed)
Start stool softener /miralax daily as outlined.  Follow.  Notify me if persistent problems.

## 2019-09-25 NOTE — Assessment & Plan Note (Signed)
Has been evaluated by [facility].  Has popliteal stenosis.  Could be contributing to leg pain. No ulceration.  Follow.

## 2019-09-25 NOTE — Assessment & Plan Note (Signed)
On no medication.  Follow met b and a1c.

## 2019-09-26 ENCOUNTER — Telehealth: Payer: Self-pay | Admitting: Adult Health Nurse Practitioner

## 2019-09-26 NOTE — Telephone Encounter (Signed)
Spoke with daughter Kendrick Fries regarding Palliative referral and she requested that I contact her sister Wynona Luna to discuss because she is the one that normally takes her to MD appointments, etc.  She gave me [name]'s cell number.  I then called [name] to schedule Palliative Consult, no answer - left message with reason for call along with my contact number.

## 2019-09-27 ENCOUNTER — Telehealth: Payer: Self-pay | Admitting: Adult Health Nurse Practitioner

## 2019-09-27 NOTE — Telephone Encounter (Signed)
Rec'd call back from patient's daughter [name], and after discussing/explaining Palliative services to her she was in agreement with this.  I have scheduled a Telephone Consult for 10/11/19 @ 9 AM.

## 2019-09-27 NOTE — Telephone Encounter (Signed)
Returned call to patient's daughter [name] to schedule Palliative Consult, no answer - left message with my contact information

## 2019-10-06 IMAGING — DX DG CHEST 1V PORT
1 series · 1 of 1 positions shown · non-contrast
Comparison: 07/06/2014

CLINICAL DATA: Chest pain.

EXAM:
PORTABLE CHEST 1 VIEW

[chest ap]
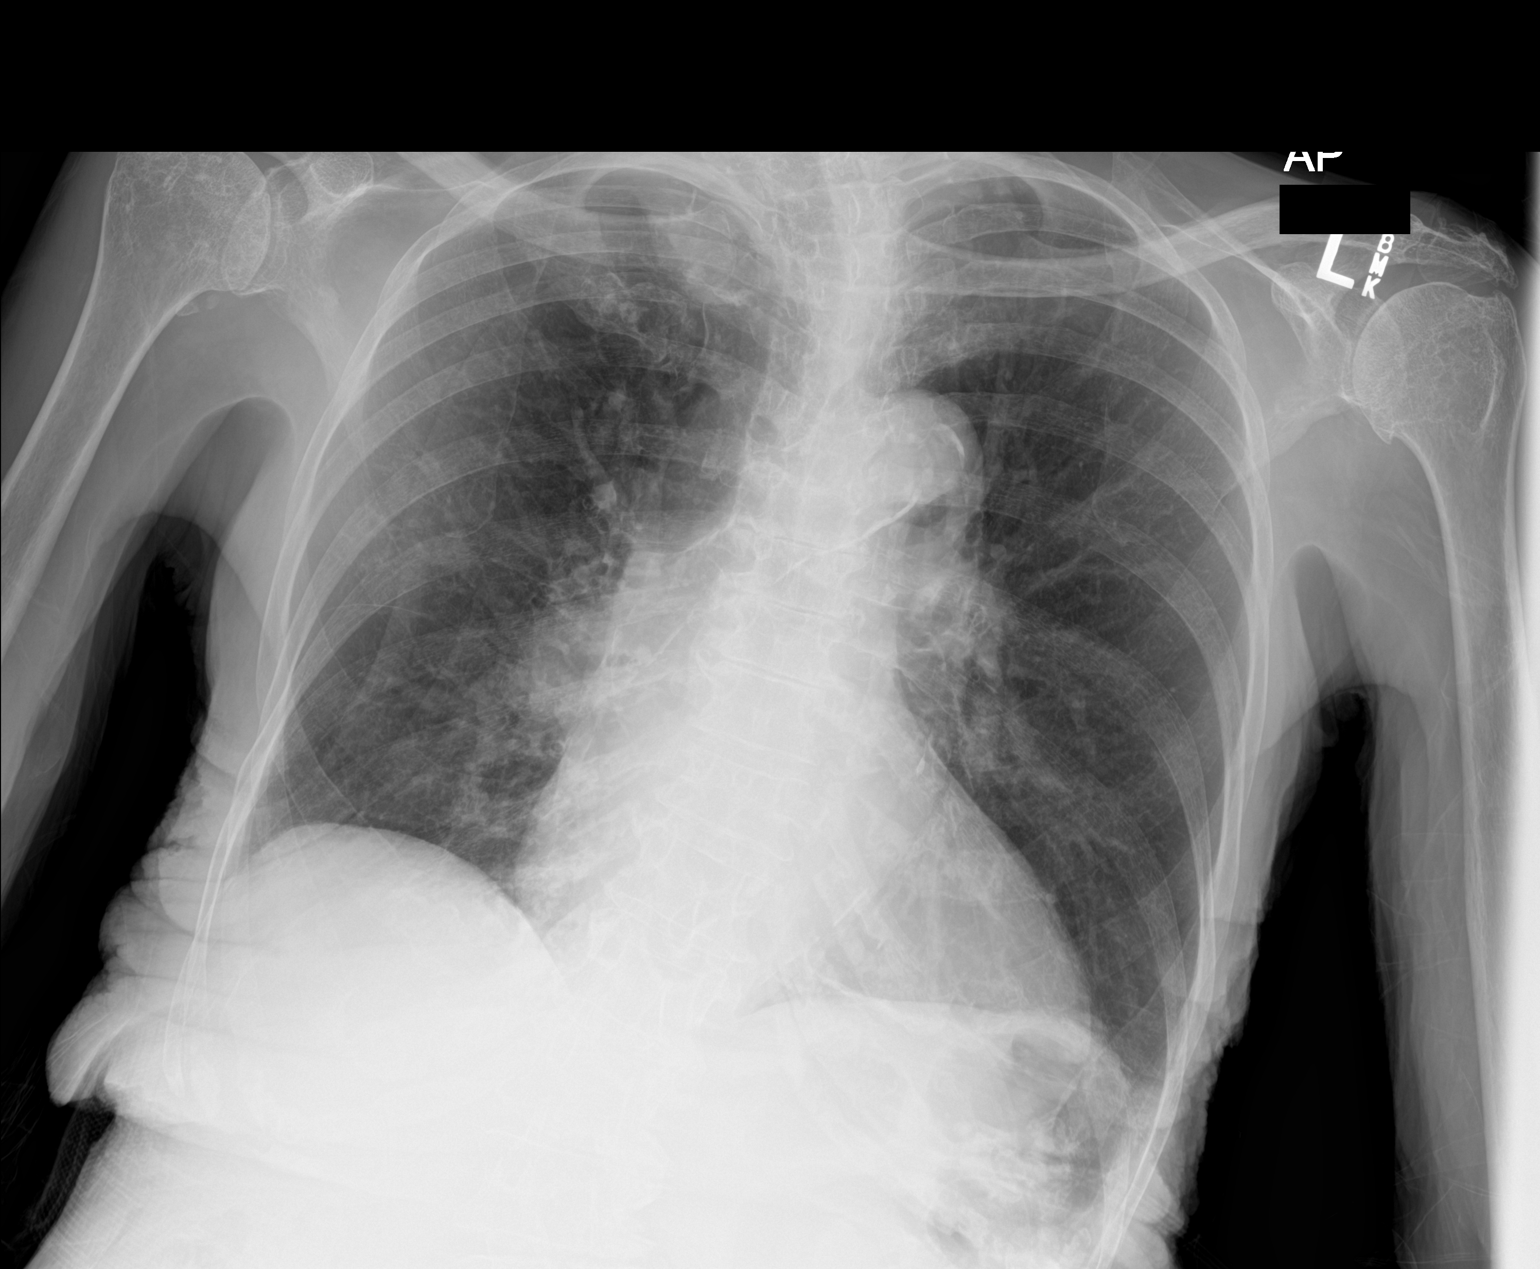

[1 of 1 positions shown; findings below may reference images not displayed]

FINDINGS: Unchanged cardiomegaly with tortuous atherosclerotic thoracic aorta.
No pulmonary edema. No consolidation, pleural effusion or
pneumothorax. Degenerative change in both shoulders.
IMPRESSION: Chronic cardiomegaly and tortuous atherosclerotic thoracic aorta. No
acute abnormality.

## 2019-10-07 IMAGING — CR DG HIP (WITH OR WITHOUT PELVIS) 3-4V BILAT
1 series · 5 of 5 positions shown · non-contrast
Comparison: Reformats from abdominal CT 06/26/2017

CLINICAL DATA: Pain after fall. Bilateral hip pain. Cannot support
weight. Right worse than left.

EXAM:
DG HIP (WITH OR WITHOUT PELVIS) 3-4V BILAT

[Series 1: view not recorded · 0.14mm/px · 5 of 5 slices shown]
[im 1/5]
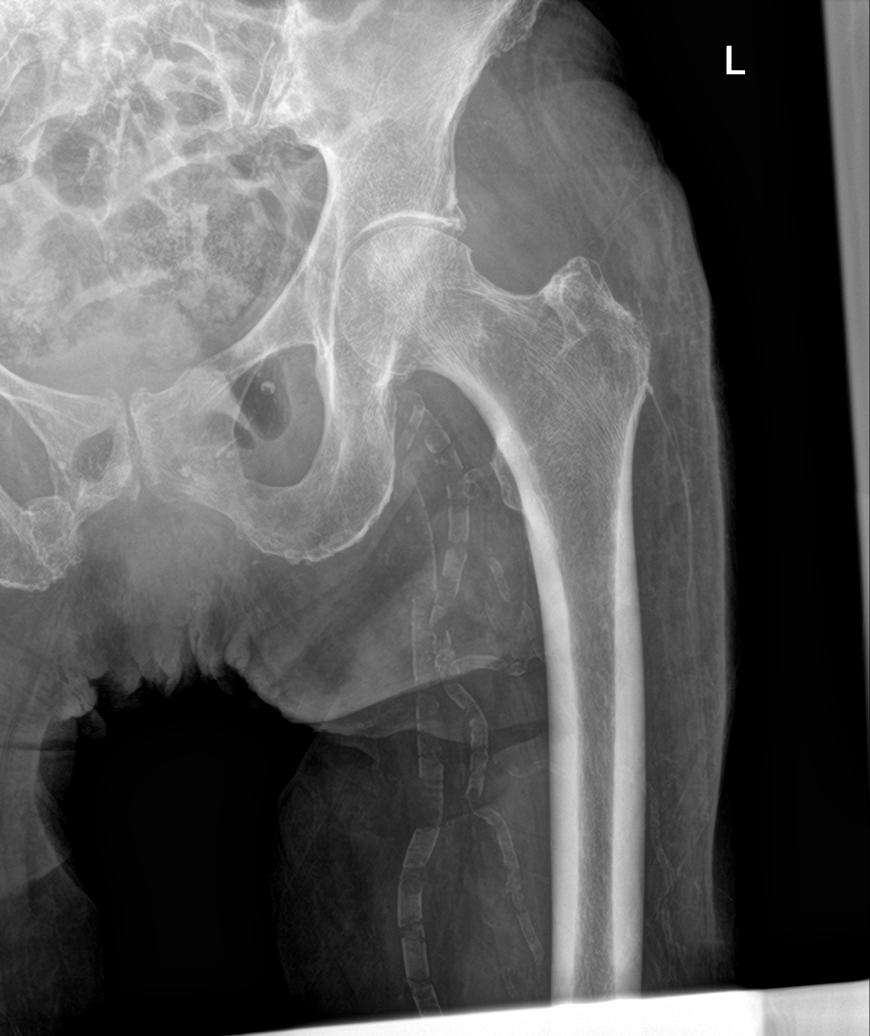
[im 2/5]
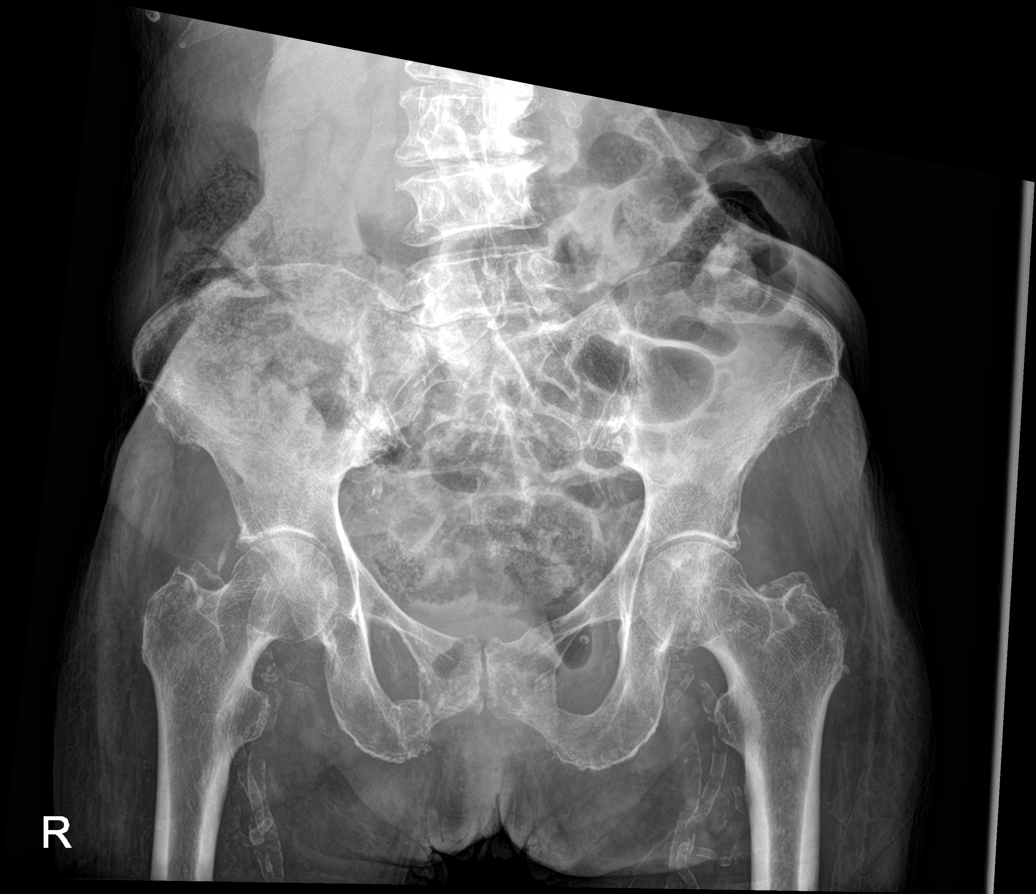
[im 3/5]
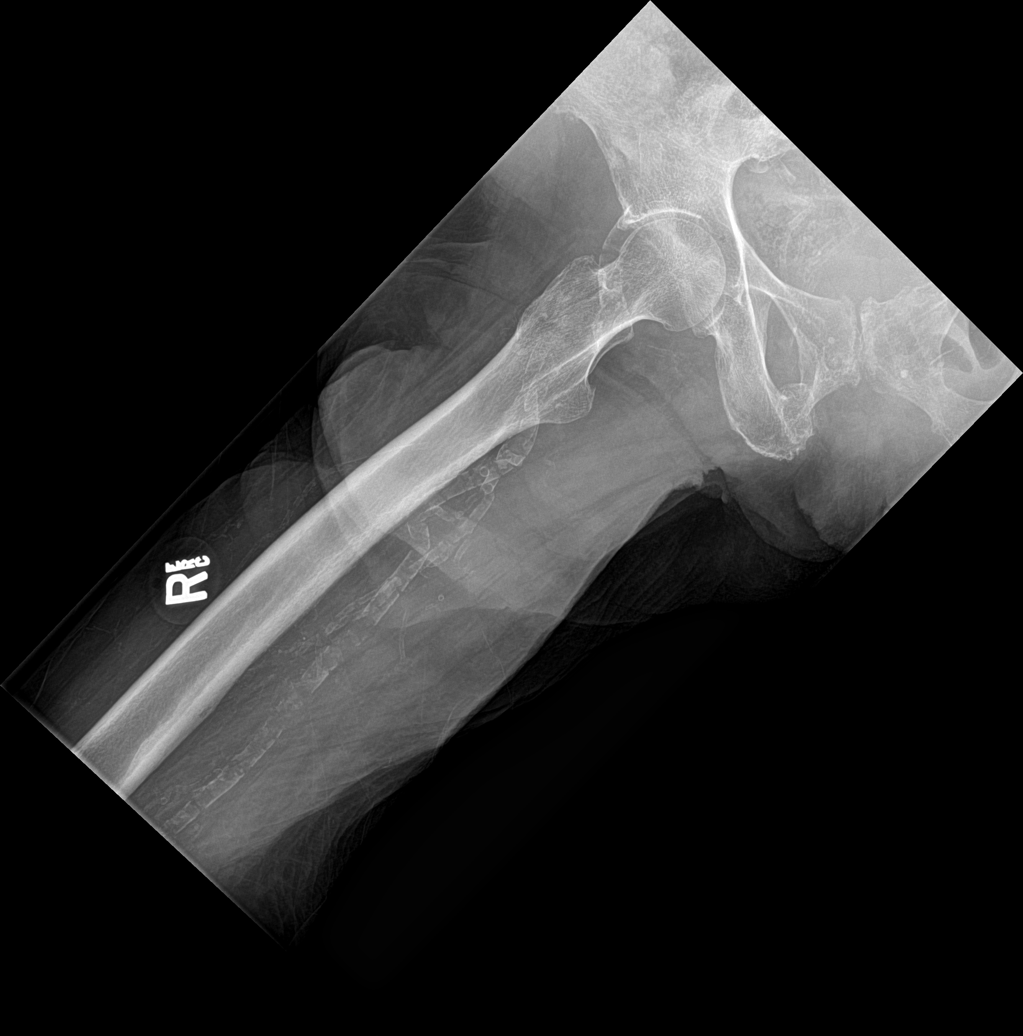
[im 4/5]
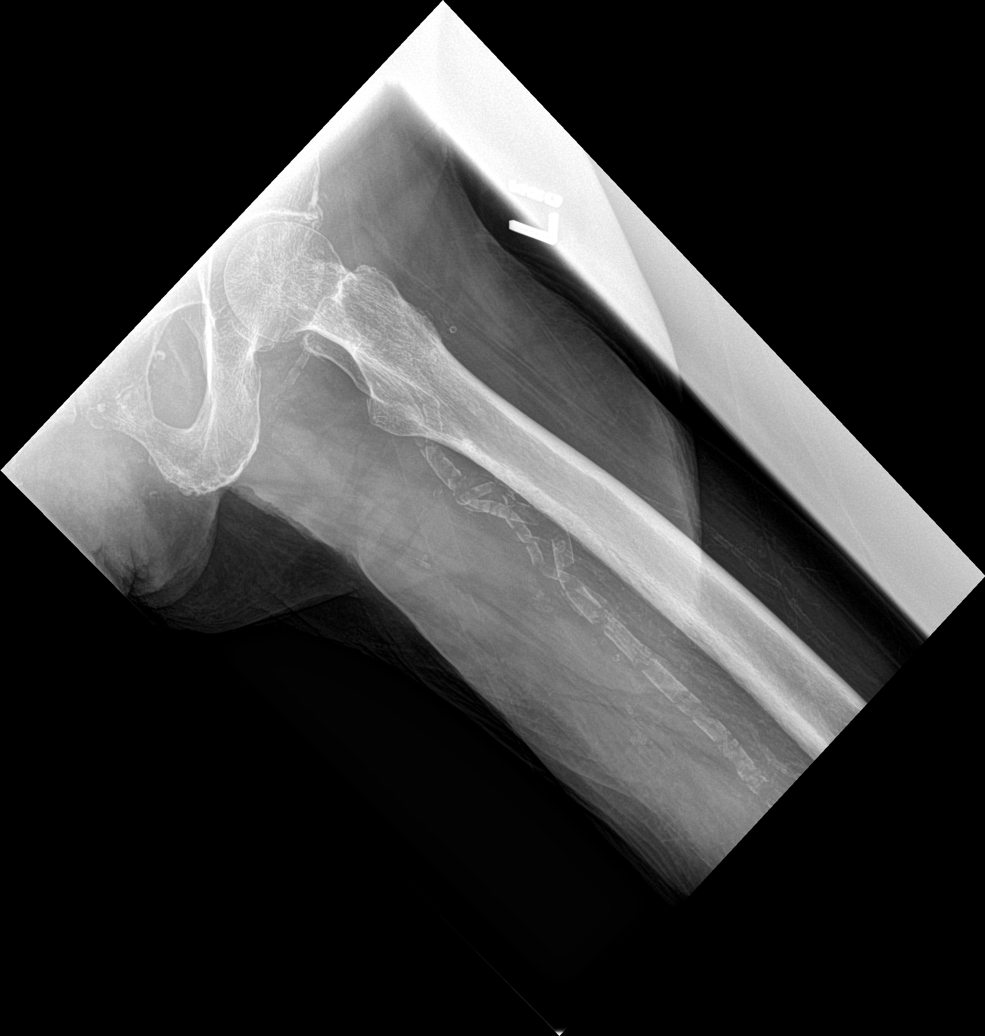
[im 5/5]
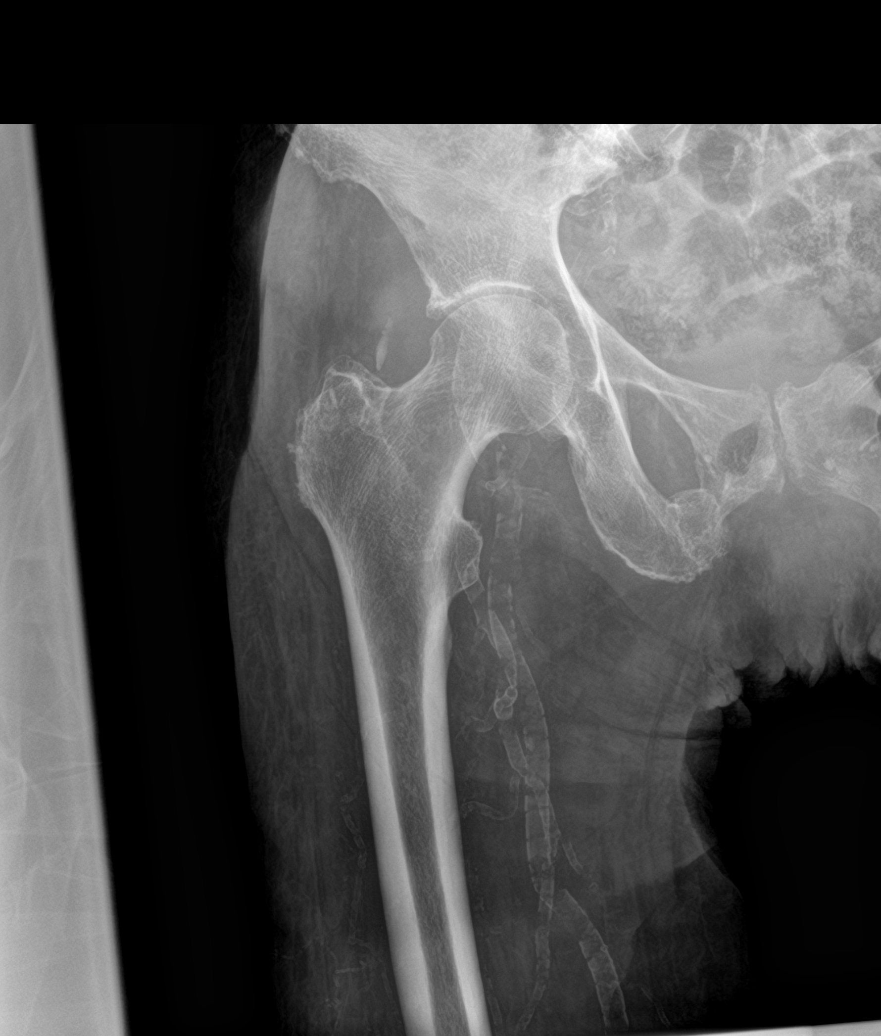

[5 of 5 positions shown; findings below may reference images not displayed]

FINDINGS: Right inferior pubic ramus fracture is remote in unchanged from
prior CT. No evidence of acute fracture. Femoral head remains
seated.

Fracture of the left superior pubic ramus is new from prior exam,
minimally displaced. Probable nondisplaced left inferior pubic ramus
fracture. Femoral head remains seated.

Pubic symphysis and sacroiliac joints remain congruent. Advanced
vascular calcifications.
IMPRESSION: 1. Acute left superior pubic ramus fracture, minimally displaced,
suspect nondisplaced left inferior ramus fracture.
2. Remote right inferior pubic ramus fracture. No acute fracture of
the right hip.

## 2019-10-11 ENCOUNTER — Other Ambulatory Visit: Payer: Self-pay

## 2019-10-11 ENCOUNTER — Other Ambulatory Visit: Payer: Medicare Other | Admitting: Adult Health Nurse Practitioner

## 2019-10-11 DIAGNOSIS — Z515 Encounter for palliative care: Secondary | ICD-10-CM | POA: Diagnosis not present

## 2019-10-11 DIAGNOSIS — N184 Chronic kidney disease, stage 4 (severe): Secondary | ICD-10-CM

## 2019-10-11 NOTE — Progress Notes (Signed)
[facility] Consult Note Telephone: [phone]  Fax: [phone]  PATIENT NAME: Tara [name] DOB: 22-Apr-1918 MRN: [id number]  PRIMARY CARE PROVIDER:   Einar Pheasant, MD  REFERRING PROVIDER:  Einar Pheasant, [id number] [place],  [place] [address]  RESPONSIBLE PARTY:   Jayme Cloud, daughter C: [phone] H: [place], daughter C: [phone]  H: [phone]  Due to the COVID-19 crisis, this visit was done via telemedicine and it was initiated and consent by this patient and or family. Video-audio (telehealth) contact was unable to be done due to technical barriers from the patient's side.    RECOMMENDATIONS and PLAN:  1.  Advanced care planning.  Patient currently full code.  Started ACP discussion with daughter, Kendrick Fries.  Kendrick Fries has agreed to have [facility] Cho[facility] book and blank MOST form sent to her to review.  2.  CKD stage IV.  Patient has has worsening kidney function.  Baseline creatinine in March 2020 was 1.01 with GFR of 61.  In October 2020 her creatinine was 2.42 and GFR of 27.6.  In November 2020 creatinine was 2.31 and GFR 23. On 08/24/2019 creatinine is 2.06, BUN 68, and GFR 23.  Due to her frailty she is not a candidate for dialysis if her kidney function worsens.  She was starting to have increased edema and SOB with activity. She was started on torsemide 20 mg daily. Denies edema, increased SOB or cough, abdominal pain, N/V/D, constipation. Her BP have been elevated with SBP ranging from 155-170. She currently is taking amlodipine 10 mg daily, clonidine 0.1 mg TID, hydralazine 100 mg TID, and labetalol 100 mg BID. She is being followed by Dr. Candiss Norse at [facility]. Has appointment with Dr. Candiss Norse in mid March. Continue follow up and recommendations by Dr. Candiss Norse.  3.  Functional status.  Patient ambulates with a walker.  Her daughter gives her a good bath once a week and  patient is able to wash her face and sponge bathe other days.  She is able to dress and toilet herself.  Her daughter clean house and prepare her meals.  Denies any falls.  4.  Nutritional status.  Patient eats well and maintains her weight around 85 pounds.  She has history of diabetes and used to be on medication for her diabetes but right now her diabetes is diet controlled.  Fastin blood sugars have been around 117-131.  Continue daily monitoring of blood sugars and healthy diet.  Palliative care will continue to monitor for symptom management/decline and make recommendations as needed.  Next appointment is in 6 weeks.  I spent 50 minutes providing this consultation,  from 9:00 to 9:45 including time with patient/family, chart review, provider coordination, and documentation. More than 50% of the time in this consultation was spent coordinating communication.   HISTORY OF PRESENT ILLNESS:  Tara [name] is a 86 y.o. year old female with multiple medical problems including CKD stage IV, asthma, DMT2, HTN, PVD. Palliative Care was asked to help address goals of care.   CODE STATUS: see above  PPS: 60% HOSPICE ELIGIBILITY/DIAGNOSIS: TBD  PHYSICAL EXAM:   Deferred  PAST MEDICAL HISTORY:  Past Medical History:  Diagnosis Date  . Asthma   . Diabetes mellitus ([place])   . Environmental allergies   . Hypertension     SOCIAL HX:  Social History   Tobacco Use  . Smoking status: Never Smoker  . Smokeless tobacco: Never Used  Substance Use Topics  . Alcohol use: No    Alcohol/week: 0.0 standard drinks    ALLERGIES:  Allergies  Allergen Reactions  . Ace Inhibitors Other (See Comments)    Angioedema  . Hydrochlorothiazide W-Triamterene Other (See Comments)    Gout flare  . Lisinopril Swelling and Other (See Comments)  . Penicillins Swelling    Has patient had a PCN reaction causing immediate rash, facial/tongue/throat swelling, SOB or lightheadedness with hypotension: Unknown Has  patient had a PCN reaction causing severe rash involving mucus membranes or skin necrosis: Unknown Has patient had a PCN reaction that required hospitalization: Unknown Has patient had a PCN reaction occurring within the last 10 years: No If all of the above answers are "NO", then may proceed with Cephalosporin use.   . Trichlormethiazide Other (See Comments)    Gout flare     PERTINENT MEDICATIONS:  Outpatient Encounter Medications as of 10/11/2019  Medication Sig  . ACCU-CHEK AVIVA PLUS test strip CHECK SUGAR ONCE DAILY  . acetaminophen (TYLENOL) 325 MG tablet Take 650 mg by mouth every 6 (six) hours as needed for mild pain.   Marland Kitchen amLODipine (NORVASC) 10 MG tablet TAKE 1 TABLET BY MOUTH EVERY DAY  . aspirin (ASPIRIN 81) 81 MG EC tablet Take 81 mg by mouth daily. Swallow whole.  . Calcium Carbonate-Vitamin D (CALTRATE 600+D PO) Take 1 tablet by mouth daily.   . cloNIDine (CATAPRES) 0.1 MG tablet TAKE 1 TABLET BY MOUTH 3 TIMES DAILY.  . fexofenadine (ALLEGRA) 180 MG tablet TAKE 1 TABLET BY MOUTH EVERY DAY  . hydrALAZINE (APRESOLINE) 100 MG tablet TAKE 1 TABLET (100 MG TOTAL) BY MOUTH 3 (THREE) TIMES DAILY.  Marland Kitchen labetalol (NORMODYNE) 100 MG tablet TAKE 1 TABLET BY MOUTH TWICE A DAY  . SYNTHROID 50 MCG tablet TAKE 1 TABLET BY MOUTH EVERY DAY   No facility-administered encounter medications on file as of 10/11/2019.      Denaya Horn Jenetta Downer, NP

## 2019-10-19 ENCOUNTER — Telehealth: Payer: Self-pay | Admitting: Internal Medicine

## 2019-10-19 ENCOUNTER — Other Ambulatory Visit: Payer: Self-pay

## 2019-10-19 MED ORDER — PEN NEEDLES 32G X 4 MM MISC: 1.0000 "application " | Freq: Every day | 1 refills | Status: DC

## 2019-10-19 NOTE — Telephone Encounter (Signed)
Patient is needing a refill on her needles.

## 2019-10-21 MED ORDER — PEN NEEDLES 32G X 4 MM MISC: 1.0000 "application " | Freq: Every day | 1 refills | Status: AC

## 2019-10-21 NOTE — Telephone Encounter (Signed)
Pt daughter called back saying that [facility] has not received the rx

## 2019-10-21 NOTE — Addendum Note (Signed)
Addended by: Elpidio Galea T on: 10/21/2019 11:37 AM   Modules accepted: Orders

## 2019-10-24 ENCOUNTER — Telehealth: Payer: Self-pay | Admitting: Internal Medicine

## 2019-10-24 NOTE — Telephone Encounter (Signed)
Pt needs refill on needles for Accu Chek. Unsure of exact name of needles

## 2019-10-25 ENCOUNTER — Other Ambulatory Visit: Payer: Self-pay

## 2019-10-25 MED ORDER — ACCU-CHEK SOFTCLIX LANCETS MISC: 12 refills | Status: AC

## 2019-10-25 NOTE — Telephone Encounter (Signed)
Rx sent in

## 2019-10-30 ENCOUNTER — Other Ambulatory Visit: Payer: Self-pay | Admitting: Internal Medicine

## 2019-11-16 ENCOUNTER — Telehealth: Payer: Self-pay | Admitting: Internal Medicine

## 2019-11-16 ENCOUNTER — Encounter: Payer: Self-pay | Admitting: Internal Medicine

## 2019-11-16 ENCOUNTER — Other Ambulatory Visit: Payer: Self-pay

## 2019-11-16 ENCOUNTER — Telehealth (INDEPENDENT_AMBULATORY_CARE_PROVIDER_SITE_OTHER): Payer: Medicare Other | Admitting: Internal Medicine

## 2019-11-16 DIAGNOSIS — E7800 Pure hypercholesterolemia, unspecified: Secondary | ICD-10-CM | POA: Diagnosis not present

## 2019-11-16 DIAGNOSIS — E1151 Type 2 diabetes mellitus with diabetic peripheral angiopathy without gangrene: Secondary | ICD-10-CM | POA: Diagnosis not present

## 2019-11-16 DIAGNOSIS — D631 Anemia in chronic kidney disease: Secondary | ICD-10-CM

## 2019-11-16 DIAGNOSIS — E034 Atrophy of thyroid (acquired): Secondary | ICD-10-CM

## 2019-11-16 DIAGNOSIS — J309 Allergic rhinitis, unspecified: Secondary | ICD-10-CM | POA: Diagnosis not present

## 2019-11-16 DIAGNOSIS — I10 Essential (primary) hypertension: Secondary | ICD-10-CM

## 2019-11-16 DIAGNOSIS — R300 Dysuria: Secondary | ICD-10-CM

## 2019-11-16 DIAGNOSIS — N184 Chronic kidney disease, stage 4 (severe): Secondary | ICD-10-CM

## 2019-11-16 DIAGNOSIS — N185 Chronic kidney disease, stage 5: Secondary | ICD-10-CM

## 2019-11-16 NOTE — Progress Notes (Signed)
Patient ID: Tara [name], female   DOB: 1918/05/19, 86 y.o.   MRN: [id number]   Virtual Visit via telephone Note  This visit type was conducted due to national recommendations for restrictions regarding the COVID-19 pandemic (e.g. social distancing).  This format is felt to be most appropriate for this patient at this time.  All issues noted in this document were discussed and addressed.  No physical exam was performed (except for noted visual exam findings with Video Visits).   I connected with Tara [name] by telephone and verified that I am speaking with the correct person using two identifiers. Location patient: home Location provider: work  Persons participating in the virtual visit: patient, provider and pts daughter Tara [name]  The limitations, risks, security and privacy concerns of performing an evaluation and management service by telephone and the availability of in person appointments have been discussed.  The patient expressed understanding and agreed to proceed.   Reason for visit:  Follow up appt  HPI: astelin and flonase - helping the breathing/congestion.  Reports some blood tinged mucus recently.  Discussed using saline nasal spray.  Breathing ok.  No cough.  Decreased po intake.  Discussed drinking nutritional shakes.  No vomiting.  No chest pain.  Blood pressures averaging 160-170/80s.  Followed by Dr Candiss Norse for her worsening kidney function and blood pressure as well.  Overall feels things are relatively stable.  Discussed with daughter - checking urine to confirm no infection.    ROS: See pertinent positives and negatives per HPI.  Past Medical History:  Diagnosis Date  . Asthma   . Diabetes mellitus ([place])   . Environmental allergies   . Hypertension     Past Surgical History:  Procedure Laterality Date  . APPENDECTOMY    . APPLICATION OF WOUND VAC Right 05/14/2018   Procedure: APPLICATION OF WOUND VAC;  Surgeon: Benjamine Sprague, DO;  Location: [facility];  Service:  [name];  Laterality: Right;  . INCISION AND DRAINAGE ABSCESS Right 05/14/2018   Procedure: INCISION AND DRAINAGE ABSCESS RIGHT GROIN;  Surgeon: Benjamine Sprague, DO;  Location: [facility];  Service: [name];  Laterality: Right;  . INGUINAL HERNIA REPAIR  03/09/2018   Procedure: HERNIA REPAIR INGUINAL INCARCERATED;  Surgeon: Benjamine Sprague, DO;  Location: [facility];  Service: [name];;  . INSERTION OF MESH  03/09/2018   Procedure: INSERTION OF MESH;  Surgeon: Benjamine Sprague, DO;  Location: [facility];  Service: [name];;  . LAPAROSCOPY N/A 03/09/2018   Procedure: LAPAROSCOPY DIAGNOSTIC;  Surgeon: Benjamine Sprague, DO;  Location: [facility];  Service: [name];  Laterality: N/A;  . TUBAL LIGATION      Family History  Problem Relation Age of Onset  . Hypertension Mother   . Cancer Brother        lung (question)  . Cancer Sister        questionable type    SOCIAL HX: reviewed.     Current Outpatient Medications:  .  ACCU-CHEK AVIVA PLUS test strip, CHECK SUGAR ONCE DAILY, Disp: 100 strip, Rfl: 6 .  Accu-Chek Softclix Lancets lancets, Use as instructed to check blood sugars daily. Dx E11.9, Disp: 100 each, Rfl: 12 .  acetaminophen (TYLENOL) 325 MG tablet, Take 650 mg by mouth every 6 (six) hours as needed for mild pain. , Disp: , Rfl:  .  amLODipine (NORVASC) 10 MG tablet, TAKE 1 TABLET BY MOUTH EVERY DAY, Disp: 90 tablet, Rfl: 1 .  aspirin (ASPIRIN 81) 81 MG EC tablet, Take 81 mg by mouth  daily. Swallow whole., Disp: , Rfl:  .  Calcium Carbonate-Vitamin D (CALTRATE 600+D PO), Take 1 tablet by mouth daily. , Disp: , Rfl:  .  cloNIDine (CATAPRES) 0.1 MG tablet, TAKE 1 TABLET BY MOUTH 3 TIMES DAILY., Disp: 270 tablet, Rfl: 0 .  fexofenadine (ALLEGRA) 180 MG tablet, TAKE 1 TABLET BY MOUTH EVERY DAY, Disp: 90 tablet, Rfl: 1 .  hydrALAZINE (APRESOLINE) 100 MG tablet, TAKE 1 TABLET (100 MG TOTAL) BY MOUTH 3 (THREE) TIMES DAILY., Disp: 270 tablet, Rfl: 1 .  Insulin Pen Needle (PEN NEEDLES) 32G X 4 MM MISC, 1  application by Does not apply route daily., Disp: 100 each, Rfl: 1 .  labetalol (NORMODYNE) 100 MG tablet, TAKE 1 TABLET BY MOUTH TWICE A DAY, Disp: 180 tablet, Rfl: 1 .  SYNTHROID 50 MCG tablet, TAKE 1 TABLET BY MOUTH EVERY DAY, Disp: 90 tablet, Rfl: 1  EXAM:  [name]: alert, oriented, appears well and in no acute distress  HEENT: atraumatic, conjunttiva clear, no obvious abnormalities on inspection of external nose and ears  NECK: normal movements of the head and neck  LUNGS: on inspection no signs of respiratory distress, breathing rate appears normal, no obvious gross SOB, gasping or wheezing  CV: no obvious cyanosis  PSYCH/NEURO: pleasant and cooperative, no obvious depression or anxiety, speech and thought processing grossly intact  ASSESSMENT AND PLAN:  Discussed the following assessment and plan:  Allergic rhinitis Doing better on astelin, flonase and saline nasal spray.  Follow.    Anemia Follow cbc.  Will check and see if palliative care can draw labs.    CKD (chronic kidney disease) stage 4, GFR 15-29 ml/min (HCC) Seeing nephrology.  Followed by Dr Candiss Norse.    Diabetes mellitus with peripheral vascular disease ([place]) On no medication.  Follow met b and a1c.    Hypercholesteremia Age 47.  Hold on statin.   Hypertension Blood pressure as outlined.  Continue current medication regimen.  Unable to take ace inhibitor or ARB.  Confirm if taking torsemide.    Hypothyroidism On thyroid replacement.  Follow tsh.     I discussed the assessment and treatment plan with the patient. The patient was provided an opportunity to ask questions and all were answered. The patient agreed with the plan and demonstrated an understanding of the instructions.   The patient was advised to call back or seek an in-person evaluation if the symptoms worsen or if the condition fails to improve as anticipated.  I provided 23 minutes of non-face-to-face time during this  encounter.   Einar Pheasant, MD

## 2019-11-16 NOTE — Telephone Encounter (Signed)
Called and spoke with nurse at palliative care. Per nurse, palliative care is unable to collect urine or draw labs. They do not have supplies to do so. Hospice or home health can draw them but palliative care does not have capability of this service. Nurse did note that home health would not conflict with palliative care services.

## 2019-11-16 NOTE — Telephone Encounter (Signed)
Please call palliative care and see if they can draw labs and check urinalysis and culture. (urine - bad odor).  Also need cbc, met c and tsh (dx weight loss).

## 2019-11-17 NOTE — Telephone Encounter (Signed)
Was pts daughter made aware that they would not be able to collect urine or draw labs.  If not, then please notify.  They were going to make arrangements to come get a collection cup and try to get a urine at home - to bring in to office if palliative care could not draw labs.  If able to do this, please place order for urine.

## 2019-11-17 NOTE — Addendum Note (Signed)
Addended by: Lars Masson on: 11/17/2019 01:21 PM   Modules accepted: Orders

## 2019-11-17 NOTE — Telephone Encounter (Addendum)
Tara [name] is going to have someone come pick up urine cup to collect. I have placed orders for urine. Cup placed up front for pick up.

## 2019-11-18 ENCOUNTER — Other Ambulatory Visit: Payer: Self-pay

## 2019-11-18 ENCOUNTER — Other Ambulatory Visit: Payer: Medicare Other

## 2019-11-18 DIAGNOSIS — R300 Dysuria: Secondary | ICD-10-CM

## 2019-11-19 LAB — URINALYSIS, ROUTINE W REFLEX MICROSCOPIC
Bacteria, UA: NONE SEEN /HPF
Bilirubin Urine: NEGATIVE
Glucose, UA: NEGATIVE
Hgb urine dipstick: NEGATIVE
Hyaline Cast: NONE SEEN /LPF
Ketones, ur: NEGATIVE
Nitrite: NEGATIVE
Specific Gravity, Urine: 1.016 (ref 1.001–1.03)
pH: <=5 (ref 5.0–8.0)

## 2019-11-20 LAB — URINE CULTURE
MICRO NUMBER:: 10346441
Result:: NO GROWTH
SPECIMEN QUALITY:: ADEQUATE

## 2019-11-22 ENCOUNTER — Other Ambulatory Visit: Payer: Self-pay

## 2019-11-22 ENCOUNTER — Other Ambulatory Visit: Payer: Medicare Other | Admitting: Adult Health Nurse Practitioner

## 2019-11-22 DIAGNOSIS — N184 Chronic kidney disease, stage 4 (severe): Secondary | ICD-10-CM | POA: Diagnosis not present

## 2019-11-22 DIAGNOSIS — Z515 Encounter for palliative care: Secondary | ICD-10-CM

## 2019-11-22 NOTE — Progress Notes (Signed)
[facility] Pall[facility] Care Consult Note Telephone: [phone]  Fax: [phone]  PATIENT NAME: Tara [name] DOB: 1917-11-20 MRN: [id number]  PRIMARY CARE PROVIDER:   Einar Pheasant, MD  REFERRING PROVIDER:  Einar Pheasant, [address] [place],  [place] [address]  RESPONSIBLE PARTY:   Jayme Cloud, daughter C: [phone] H: [place], daughter C: [phone]  H: Maud and PLAN:  1.  Advanced care planning.  Patient currently full code. Hard Choices and blank MOST being sent to family to review  2.  CKD stage IV.  Patient has has worsening kidney function.  On 08/24/2019 creatinine is 2.06, BUN 68, and GFR 23.  Due to her frailty she is not a candidate for dialysis if her kidney function worsens.  She has edema that goes up to her knees, see below.  Is on Torsemide 20 mg daily.   She states having some SOB when she talks and with activity. She denies cough, abdominal pain, N/V/D, constipation, chest pain.  She has not taken her weight in a few days but it has been maintaining around 85 pounds.  Her lungs are clear.  She is being followed by Dr. Candiss Norse at [facility].  Continue follow up and recommendations by Dr. Candiss Norse  3.  Functional status.  She does ambulate with a walker.  She is continent of [place].  Does require some assistance with ADLs.  Does take frequent rest breaks. Did have a an episode in which she stood up and slid back in her chair.  States that she takes her time with position changes so she doesn't fall.  Encouraged to continue taking frequent rest breaks.  Her appetite is fair and she supplements with Ensure.  Continue supplementation and if starts losing weight again will consider something to stimulate appetite.  Palliative care will continue to monitor for symptom management/decline and make recommendations as needed.  Will call in 6-8 weeks for follow up.   Encouraged to call with any concerns.  I spent 50 minutes providing this consultation,  from 1:00 to 1:50 including time with patient/family, chart review, provider coordination, and documentation. More than 50% of the time in this consultation was spent coordinating communication.   HISTORY OF PRESENT ILLNESS:  Tara [name] is a 86 y.o. year old female with multiple medical problems including CKD stage IV, asthma, DMT2, HTN, PVD. Palliative Care was asked to help address goals of care.   CODE STATUS: see above  PPS: 50% HOSPICE ELIGIBILITY/DIAGNOSIS: TBD  PHYSICAL EXAM:  BP 148/82  HR 77  O2 96% on RA General: NAD, frail appearing, thin Cardiovascular: regular rate and rhythm Pulmonary: lung sounds clear; normal respiratory effort Abdomen: soft, nontender, + bowel sounds GU: no suprapubic tenderness Extremities: has 1-2+ pitting edema to goes up to her knees, no joint deformities Skin: no rashes on exposed skin Neurological: Weakness but otherwise nonfocal   PAST MEDICAL HISTORY:  Past Medical History:  Diagnosis Date  . Asthma   . Diabetes mellitus ([place])   . Environmental allergies   . Hypertension     SOCIAL HX:  Social History   Tobacco Use  . Smoking status: Never Smoker  . Smokeless tobacco: Never Used  Substance Use Topics  . Alcohol use: No    Alcohol/week: 0.0 standard drinks    ALLERGIES:  Allergies  Allergen Reactions  . Ace Inhibitors Other (See Comments)    Angioedema  . Hydrochlorothiazide  W-Triamterene Other (See Comments)    Gout flare  . Lisinopril Swelling and Other (See Comments)  . Penicillins Swelling    Has patient had a PCN reaction causing immediate rash, facial/tongue/throat swelling, SOB or lightheadedness with hypotension: Unknown Has patient had a PCN reaction causing severe rash involving mucus membranes or skin necrosis: Unknown Has patient had a PCN reaction that required hospitalization: Unknown Has patient had a PCN reaction  occurring within the last 10 years: No If all of the above answers are "NO", then may proceed with Cephalosporin use.   . Trichlormethiazide Other (See Comments)    Gout flare     PERTINENT MEDICATIONS:  Outpatient Encounter Medications as of 11/22/2019  Medication Sig  . ACCU-CHEK AVIVA PLUS test strip CHECK SUGAR ONCE DAILY  . Accu-Chek Softclix Lancets lancets Use as instructed to check blood sugars daily. Dx E11.9  . acetaminophen (TYLENOL) 325 MG tablet Take 650 mg by mouth every 6 (six) hours as needed for mild pain.   Marland Kitchen amLODipine (NORVASC) 10 MG tablet TAKE 1 TABLET BY MOUTH EVERY DAY  . aspirin (ASPIRIN 81) 81 MG EC tablet Take 81 mg by mouth daily. Swallow whole.  . Calcium Carbonate-Vitamin D (CALTRATE 600+D PO) Take 1 tablet by mouth daily.   . cloNIDine (CATAPRES) 0.1 MG tablet TAKE 1 TABLET BY MOUTH 3 TIMES DAILY.  . fexofenadine (ALLEGRA) 180 MG tablet TAKE 1 TABLET BY MOUTH EVERY DAY  . hydrALAZINE (APRESOLINE) 100 MG tablet TAKE 1 TABLET (100 MG TOTAL) BY MOUTH 3 (THREE) TIMES DAILY.  Marland Kitchen Insulin Pen Needle (PEN NEEDLES) 32G X 4 MM MISC 1 application by Does not apply route daily.  Marland Kitchen labetalol (NORMODYNE) 100 MG tablet TAKE 1 TABLET BY MOUTH TWICE A DAY  . SYNTHROID 50 MCG tablet TAKE 1 TABLET BY MOUTH EVERY DAY   No facility-administered encounter medications on file as of 11/22/2019.     Lachlan Mckim Jenetta Downer, NP

## 2019-11-26 ENCOUNTER — Encounter: Payer: Self-pay | Admitting: Internal Medicine

## 2019-11-26 NOTE — Assessment & Plan Note (Signed)
On thyroid replacement.  Follow tsh.  

## 2019-11-26 NOTE — Assessment & Plan Note (Signed)
Seeing nephrology.  Followed by Dr Candiss Norse.

## 2019-11-26 NOTE — Assessment & Plan Note (Signed)
Follow cbc.  Will check and see if palliative care can draw labs.

## 2019-11-26 NOTE — Assessment & Plan Note (Signed)
Blood pressure as outlined.  Continue current medication regimen.  Unable to take ace inhibitor or ARB.  Confirm if taking torsemide.

## 2019-11-26 NOTE — Assessment & Plan Note (Signed)
Age 86.  Hold on statin.

## 2019-11-26 NOTE — Assessment & Plan Note (Signed)
Doing better on astelin, flonase and saline nasal spray.  Follow.

## 2019-11-26 NOTE — Assessment & Plan Note (Signed)
On no medication.  Follow met b and a1c.  

## 2019-12-29 ENCOUNTER — Telehealth: Payer: Self-pay | Admitting: Internal Medicine

## 2019-12-29 NOTE — Telephone Encounter (Signed)
Tara [name] stated that pt had a small BM last night after taking a stool softener but has not had a good BM in 7 days. She is not eating a lot but she does eat what is normal for her. She is drinking. No complaints of any other acute symptoms. The only thing they have given her is a stool softener. Has not tried miralax or enema. Was wondering if there was something OTC they could give her.

## 2019-12-29 NOTE — Telephone Encounter (Signed)
If has been 7 days, can use a dulcolax suppository.  If no results can try enema.  Once has bowel movement, then would recommend miralax daily.

## 2019-12-29 NOTE — Telephone Encounter (Signed)
Tara [name] (daughter) called and asked if PCP could call her in a medication to have a BM. She has not had a BM 7 days. Please advise [phone]

## 2019-12-29 NOTE — Telephone Encounter (Signed)
Patients daughter is aware and will try the suppository. If no success will use enema. Then start miralax daily.

## 2020-01-21 ENCOUNTER — Other Ambulatory Visit: Payer: Self-pay | Admitting: Internal Medicine

## 2020-01-25 ENCOUNTER — Other Ambulatory Visit: Payer: Self-pay

## 2020-01-25 MED ORDER — HYDRALAZINE HCL 100 MG PO TABS: 100.0000 mg | ORAL_TABLET | Freq: Three times a day (TID) | ORAL | 1 refills | Status: AC

## 2020-01-27 ENCOUNTER — Other Ambulatory Visit: Payer: Self-pay | Admitting: Internal Medicine

## 2020-01-31 ENCOUNTER — Other Ambulatory Visit: Payer: Self-pay

## 2020-01-31 ENCOUNTER — Inpatient Hospital Stay
Admission: EM | Admit: 2020-01-31 | Discharge: 2020-02-09 | DRG: 377 | Disposition: A | Discharge status: Skilled Nursing Facility | Payer: Medicare Other | Attending: Internal Medicine | Admitting: Internal Medicine

## 2020-01-31 ENCOUNTER — Telehealth: Payer: Self-pay | Admitting: Internal Medicine

## 2020-01-31 ENCOUNTER — Inpatient Hospital Stay
Admit: 2020-01-31 | Discharge: 2020-01-31 | Disposition: A | Discharge status: Home / Self Care | Payer: Medicare Other | Attending: Family Medicine | Admitting: Family Medicine

## 2020-01-31 ENCOUNTER — Encounter: Payer: Self-pay | Admitting: Psychiatry

## 2020-01-31 ENCOUNTER — Emergency Department: Payer: Medicare Other

## 2020-01-31 DIAGNOSIS — R809 Proteinuria, unspecified: Secondary | ICD-10-CM | POA: Diagnosis not present

## 2020-01-31 DIAGNOSIS — I161 Hypertensive emergency: Secondary | ICD-10-CM | POA: Diagnosis present

## 2020-01-31 DIAGNOSIS — K922 Gastrointestinal hemorrhage, unspecified: Secondary | ICD-10-CM | POA: Diagnosis not present

## 2020-01-31 DIAGNOSIS — N189 Chronic kidney disease, unspecified: Secondary | ICD-10-CM

## 2020-01-31 DIAGNOSIS — J45909 Unspecified asthma, uncomplicated: Secondary | ICD-10-CM | POA: Diagnosis present

## 2020-01-31 DIAGNOSIS — N184 Chronic kidney disease, stage 4 (severe): Secondary | ICD-10-CM | POA: Diagnosis not present

## 2020-01-31 DIAGNOSIS — D649 Anemia, unspecified: Secondary | ICD-10-CM

## 2020-01-31 DIAGNOSIS — I160 Hypertensive urgency: Secondary | ICD-10-CM

## 2020-01-31 DIAGNOSIS — Z743 Need for continuous supervision: Secondary | ICD-10-CM | POA: Diagnosis not present

## 2020-01-31 DIAGNOSIS — Z66 Do not resuscitate: Secondary | ICD-10-CM | POA: Diagnosis not present

## 2020-01-31 DIAGNOSIS — K5900 Constipation, unspecified: Secondary | ICD-10-CM | POA: Diagnosis not present

## 2020-01-31 DIAGNOSIS — D631 Anemia in chronic kidney disease: Secondary | ICD-10-CM | POA: Diagnosis present

## 2020-01-31 DIAGNOSIS — Z79899 Other long term (current) drug therapy: Secondary | ICD-10-CM

## 2020-01-31 DIAGNOSIS — I5031 Acute diastolic (congestive) heart failure: Secondary | ICD-10-CM

## 2020-01-31 DIAGNOSIS — Z681 Body mass index (BMI) 19 or less, adult: Secondary | ICD-10-CM | POA: Diagnosis not present

## 2020-01-31 DIAGNOSIS — Z7189 Other specified counseling: Secondary | ICD-10-CM | POA: Diagnosis not present

## 2020-01-31 DIAGNOSIS — R001 Bradycardia, unspecified: Secondary | ICD-10-CM | POA: Diagnosis not present

## 2020-01-31 DIAGNOSIS — N170 Acute kidney failure with tubular necrosis: Secondary | ICD-10-CM | POA: Diagnosis present

## 2020-01-31 DIAGNOSIS — I129 Hypertensive chronic kidney disease with stage 1 through stage 4 chronic kidney disease, or unspecified chronic kidney disease: Secondary | ICD-10-CM | POA: Diagnosis not present

## 2020-01-31 DIAGNOSIS — R195 Other fecal abnormalities: Secondary | ICD-10-CM

## 2020-01-31 DIAGNOSIS — L8990 Pressure ulcer of unspecified site, unspecified stage: Secondary | ICD-10-CM | POA: Insufficient documentation

## 2020-01-31 DIAGNOSIS — N179 Acute kidney failure, unspecified: Secondary | ICD-10-CM | POA: Diagnosis not present

## 2020-01-31 DIAGNOSIS — Z8673 Personal history of transient ischemic attack (TIA), and cerebral infarction without residual deficits: Secondary | ICD-10-CM

## 2020-01-31 DIAGNOSIS — E538 Deficiency of other specified B group vitamins: Secondary | ICD-10-CM | POA: Diagnosis not present

## 2020-01-31 DIAGNOSIS — I517 Cardiomegaly: Secondary | ICD-10-CM | POA: Diagnosis not present

## 2020-01-31 DIAGNOSIS — N2581 Secondary hyperparathyroidism of renal origin: Secondary | ICD-10-CM | POA: Diagnosis present

## 2020-01-31 DIAGNOSIS — J90 Pleural effusion, not elsewhere classified: Secondary | ICD-10-CM | POA: Diagnosis not present

## 2020-01-31 DIAGNOSIS — E43 Unspecified severe protein-calorie malnutrition: Secondary | ICD-10-CM | POA: Diagnosis not present

## 2020-01-31 DIAGNOSIS — H9190 Unspecified hearing loss, unspecified ear: Secondary | ICD-10-CM | POA: Diagnosis present

## 2020-01-31 DIAGNOSIS — E1122 Type 2 diabetes mellitus with diabetic chronic kidney disease: Secondary | ICD-10-CM | POA: Diagnosis present

## 2020-01-31 DIAGNOSIS — E1151 Type 2 diabetes mellitus with diabetic peripheral angiopathy without gangrene: Secondary | ICD-10-CM | POA: Diagnosis not present

## 2020-01-31 DIAGNOSIS — D62 Acute posthemorrhagic anemia: Secondary | ICD-10-CM | POA: Diagnosis not present

## 2020-01-31 DIAGNOSIS — R6889 Other general symptoms and signs: Secondary | ICD-10-CM | POA: Diagnosis not present

## 2020-01-31 DIAGNOSIS — R531 Weakness: Secondary | ICD-10-CM

## 2020-01-31 DIAGNOSIS — Z794 Long term (current) use of insulin: Secondary | ICD-10-CM

## 2020-01-31 DIAGNOSIS — R54 Age-related physical debility: Secondary | ICD-10-CM | POA: Diagnosis not present

## 2020-01-31 DIAGNOSIS — K219 Gastro-esophageal reflux disease without esophagitis: Secondary | ICD-10-CM | POA: Diagnosis not present

## 2020-01-31 DIAGNOSIS — R5381 Other malaise: Secondary | ICD-10-CM | POA: Diagnosis not present

## 2020-01-31 DIAGNOSIS — R1311 Dysphagia, oral phase: Secondary | ICD-10-CM | POA: Diagnosis not present

## 2020-01-31 DIAGNOSIS — J810 Acute pulmonary edema: Secondary | ICD-10-CM | POA: Diagnosis not present

## 2020-01-31 DIAGNOSIS — D7589 Other specified diseases of blood and blood-forming organs: Secondary | ICD-10-CM | POA: Diagnosis present

## 2020-01-31 DIAGNOSIS — E039 Hypothyroidism, unspecified: Secondary | ICD-10-CM | POA: Diagnosis present

## 2020-01-31 DIAGNOSIS — Z515 Encounter for palliative care: Secondary | ICD-10-CM | POA: Diagnosis not present

## 2020-01-31 DIAGNOSIS — L89152 Pressure ulcer of sacral region, stage 2: Secondary | ICD-10-CM | POA: Diagnosis not present

## 2020-01-31 DIAGNOSIS — Z8249 Family history of ischemic heart disease and other diseases of the circulatory system: Secondary | ICD-10-CM | POA: Diagnosis not present

## 2020-01-31 DIAGNOSIS — E7800 Pure hypercholesterolemia, unspecified: Secondary | ICD-10-CM | POA: Diagnosis not present

## 2020-01-31 DIAGNOSIS — K5909 Other constipation: Secondary | ICD-10-CM | POA: Diagnosis present

## 2020-01-31 DIAGNOSIS — G9340 Encephalopathy, unspecified: Secondary | ICD-10-CM | POA: Diagnosis not present

## 2020-01-31 DIAGNOSIS — Z20822 Contact with and (suspected) exposure to covid-19: Secondary | ICD-10-CM | POA: Diagnosis not present

## 2020-01-31 DIAGNOSIS — M810 Age-related osteoporosis without current pathological fracture: Secondary | ICD-10-CM | POA: Diagnosis not present

## 2020-01-31 DIAGNOSIS — I130 Hypertensive heart and chronic kidney disease with heart failure and stage 1 through stage 4 chronic kidney disease, or unspecified chronic kidney disease: Secondary | ICD-10-CM | POA: Diagnosis not present

## 2020-01-31 DIAGNOSIS — I422 Other hypertrophic cardiomyopathy: Secondary | ICD-10-CM | POA: Diagnosis not present

## 2020-01-31 DIAGNOSIS — I5032 Chronic diastolic (congestive) heart failure: Secondary | ICD-10-CM | POA: Diagnosis not present

## 2020-01-31 DIAGNOSIS — Z7982 Long term (current) use of aspirin: Secondary | ICD-10-CM

## 2020-01-31 DIAGNOSIS — I10 Essential (primary) hypertension: Secondary | ICD-10-CM | POA: Diagnosis not present

## 2020-01-31 DIAGNOSIS — Z888 Allergy status to other drugs, medicaments and biological substances status: Secondary | ICD-10-CM | POA: Diagnosis not present

## 2020-01-31 DIAGNOSIS — R279 Unspecified lack of coordination: Secondary | ICD-10-CM | POA: Diagnosis not present

## 2020-01-31 DIAGNOSIS — Z880 Allergy status to penicillin: Secondary | ICD-10-CM

## 2020-01-31 DIAGNOSIS — Z800 Family history of malignant neoplasm of digestive organs: Secondary | ICD-10-CM

## 2020-01-31 DIAGNOSIS — M6281 Muscle weakness (generalized): Secondary | ICD-10-CM | POA: Diagnosis not present

## 2020-01-31 LAB — COMPREHENSIVE METABOLIC PANEL
ALT: 11 U/L (ref 0–44)
AST: 24 U/L (ref 15–41)
Albumin: 3.1 g/dL — ABNORMAL LOW (ref 3.5–5.0)
Alkaline Phosphatase: 39 U/L (ref 38–126)
Anion gap: 6 (ref 5–15)
BUN: 73 mg/dL — ABNORMAL HIGH (ref 8–23)
CO2: 21 mmol/L — ABNORMAL LOW (ref 22–32)
Calcium: 9.6 mg/dL (ref 8.9–10.3)
Chloride: 117 mmol/L — ABNORMAL HIGH (ref 98–111)
Creatinine, Ser: 2.54 mg/dL — ABNORMAL HIGH (ref 0.44–1.00)
GFR calc Af Amer: 17 mL/min — ABNORMAL LOW (ref >60)
GFR calc non Af Amer: 15 mL/min — ABNORMAL LOW (ref >60)
Glucose, Bld: 140 mg/dL — ABNORMAL HIGH (ref 70–99)
Potassium: 4.7 mmol/L (ref 3.5–5.1)
Sodium: 144 mmol/L (ref 135–145)
Total Bilirubin: 0.8 mg/dL (ref 0.3–1.2)
Total Protein: 6.7 g/dL (ref 6.5–8.1)

## 2020-01-31 LAB — CBC WITH DIFFERENTIAL/PLATELET
Abs Immature Granulocytes: 0.03 10*3/uL (ref 0.00–0.07)
Basophils Absolute: 0 10*3/uL (ref 0.0–0.1)
Basophils Relative: 1 %
Eosinophils Absolute: 0.1 10*3/uL (ref 0.0–0.5)
Eosinophils Relative: 3 %
HCT: 18.6 % — ABNORMAL LOW (ref 36.0–46.0)
Hemoglobin: 5.9 g/dL — ABNORMAL LOW (ref 12.0–15.0)
Immature Granulocytes: 1 %
Lymphocytes Relative: 25 %
Lymphs Abs: 1.2 10*3/uL (ref 0.7–4.0)
MCH: 32.8 pg (ref 26.0–34.0)
MCHC: 31.7 g/dL (ref 30.0–36.0)
MCV: 103.3 fL — ABNORMAL HIGH (ref 80.0–100.0)
Monocytes Absolute: 0.4 10*3/uL (ref 0.1–1.0)
Monocytes Relative: 8 %
Neutro Abs: 3.1 10*3/uL (ref 1.7–7.7)
Neutrophils Relative %: 62 %
Platelets: 188 10*3/uL (ref 150–400)
RBC: 1.8 MIL/uL — ABNORMAL LOW (ref 3.87–5.11)
RDW: 17.7 % — ABNORMAL HIGH (ref 11.5–15.5)
WBC: 4.8 10*3/uL (ref 4.0–10.5)
nRBC: 0.4 % — ABNORMAL HIGH (ref 0.0–0.2)

## 2020-01-31 LAB — HEMOGLOBIN AND HEMATOCRIT, BLOOD
HCT: 20 % — ABNORMAL LOW (ref 36.0–46.0)
Hemoglobin: 6.5 g/dL — ABNORMAL LOW (ref 12.0–15.0)

## 2020-01-31 LAB — GLUCOSE, CAPILLARY: Glucose-Capillary: 115 mg/dL — ABNORMAL HIGH (ref 70–99)

## 2020-01-31 LAB — TROPONIN I (HIGH SENSITIVITY): Troponin I (High Sensitivity): 47 ng/L — ABNORMAL HIGH (ref <18)

## 2020-01-31 LAB — SARS CORONAVIRUS 2 BY RT PCR (HOSPITAL ORDER, PERFORMED IN ~~LOC~~ HOSPITAL LAB): SARS Coronavirus 2: NEGATIVE

## 2020-01-31 LAB — PREPARE RBC (CROSSMATCH)

## 2020-01-31 LAB — ABO/RH: ABO/RH(D): A POS

## 2020-01-31 MED ORDER — FUROSEMIDE 10 MG/ML IJ SOLN
40.0000 mg | Freq: Once | INTRAMUSCULAR | Status: AC
  Administered 2020-01-31: 40 mg via INTRAVENOUS
  Filled 2020-01-31: qty 4

## 2020-01-31 MED ORDER — LORATADINE 10 MG PO TABS
10.0000 mg | ORAL_TABLET | Freq: Every day | ORAL | Status: DC
  Administered 2020-02-01 – 2020-02-09 (×9): 10 mg via ORAL
  Filled 2020-01-31 (×9): qty 1

## 2020-01-31 MED ORDER — CLONIDINE HCL 0.1 MG PO TABS
0.1000 mg | ORAL_TABLET | Freq: Three times a day (TID) | ORAL | Status: DC
  Administered 2020-01-31 – 2020-02-09 (×25): 0.1 mg via ORAL
  Filled 2020-01-31 (×26): qty 1

## 2020-01-31 MED ORDER — SODIUM CHLORIDE 0.9 % IV SOLN
80.0000 mg | Freq: Once | INTRAVENOUS | Status: AC
  Administered 2020-01-31: 80 mg via INTRAVENOUS
  Filled 2020-01-31: qty 80

## 2020-01-31 MED ORDER — ONDANSETRON HCL 4 MG/2ML IJ SOLN: 4.0000 mg | Freq: Four times a day (QID) | INTRAMUSCULAR | Status: DC | PRN

## 2020-01-31 MED ORDER — LABETALOL HCL 100 MG PO TABS
100.0000 mg | ORAL_TABLET | Freq: Two times a day (BID) | ORAL | Status: DC
  Administered 2020-02-01 (×2): 100 mg via ORAL
  Filled 2020-01-31 (×3): qty 1

## 2020-01-31 MED ORDER — AMLODIPINE BESYLATE 10 MG PO TABS
10.0000 mg | ORAL_TABLET | Freq: Every day | ORAL | Status: DC
  Administered 2020-02-01 – 2020-02-04 (×4): 10 mg via ORAL
  Filled 2020-01-31 (×4): qty 1

## 2020-01-31 MED ORDER — ONDANSETRON HCL 4 MG PO TABS: 4.0000 mg | ORAL_TABLET | Freq: Four times a day (QID) | ORAL | Status: DC | PRN

## 2020-01-31 MED ORDER — SODIUM CHLORIDE 0.9 % IV SOLN
8.0000 mg/h | INTRAVENOUS | Status: DC
  Administered 2020-01-31 – 2020-02-01 (×2): 8 mg/h via INTRAVENOUS
  Filled 2020-01-31 (×2): qty 80

## 2020-01-31 MED ORDER — ACETAMINOPHEN 650 MG RE SUPP: 650.0000 mg | Freq: Four times a day (QID) | RECTAL | Status: DC | PRN

## 2020-01-31 MED ORDER — FUROSEMIDE 10 MG/ML IJ SOLN: 40.0000 mg | Freq: Two times a day (BID) | INTRAMUSCULAR | Status: DC

## 2020-01-31 MED ORDER — TRAZODONE HCL 50 MG PO TABS
25.0000 mg | ORAL_TABLET | Freq: Every evening | ORAL | Status: DC | PRN
  Administered 2020-02-03: 25 mg via ORAL
  Filled 2020-01-31: qty 1

## 2020-01-31 MED ORDER — INSULIN ASPART 100 UNIT/ML ~~LOC~~ SOLN
0.0000 [IU] | Freq: Three times a day (TID) | SUBCUTANEOUS | Status: DC
  Administered 2020-02-01 – 2020-02-02 (×2): 2 [IU] via SUBCUTANEOUS
  Administered 2020-02-03: 1 [IU] via SUBCUTANEOUS
  Administered 2020-02-04: 2 [IU] via SUBCUTANEOUS
  Filled 2020-01-31 (×4): qty 1

## 2020-01-31 MED ORDER — ASPIRIN EC 81 MG PO TBEC
81.0000 mg | DELAYED_RELEASE_TABLET | Freq: Every day | ORAL | Status: DC
  Administered 2020-02-01 – 2020-02-02 (×2): 81 mg via ORAL
  Filled 2020-01-31 (×2): qty 1

## 2020-01-31 MED ORDER — LABETALOL HCL 5 MG/ML IV SOLN
20.0000 mg | INTRAVENOUS | Status: DC | PRN
  Administered 2020-02-01 – 2020-02-03 (×5): 20 mg via INTRAVENOUS
  Filled 2020-01-31 (×6): qty 4

## 2020-01-31 MED ORDER — HYDRALAZINE HCL 50 MG PO TABS
100.0000 mg | ORAL_TABLET | Freq: Three times a day (TID) | ORAL | Status: DC
  Administered 2020-01-31 – 2020-02-02 (×7): 100 mg via ORAL
  Filled 2020-01-31 (×7): qty 2

## 2020-01-31 MED ORDER — ACETAMINOPHEN 325 MG PO TABS
650.0000 mg | ORAL_TABLET | Freq: Four times a day (QID) | ORAL | Status: DC | PRN
  Administered 2020-02-04 – 2020-02-08 (×2): 650 mg via ORAL
  Filled 2020-01-31 (×2): qty 2

## 2020-01-31 MED ORDER — PANTOPRAZOLE SODIUM 40 MG IV SOLR
40.0000 mg | Freq: Two times a day (BID) | INTRAVENOUS | Status: DC
  Administered 2020-02-04 – 2020-02-09 (×11): 40 mg via INTRAVENOUS
  Filled 2020-01-31 (×11): qty 40

## 2020-01-31 MED ORDER — SODIUM CHLORIDE 0.9 % IV SOLN
10.0000 mL/h | Freq: Once | INTRAVENOUS | Status: AC
  Administered 2020-02-01: 10 mL/h via INTRAVENOUS

## 2020-01-31 NOTE — ED Provider Notes (Signed)
ER Provider Note       Time seen: 4:56 PM    I have reviewed the vital signs and the nursing notes.  HISTORY   Chief Complaint Weakness    HPI Tara [name] is a 86 y.o. female with a history of asthma, diabetes, hypertension who presents today for weakness, particularly in her legs.  Family reports she has had weakness and difficulty standing.  Patient typically walks with a walker.  She denies any recent illness.  She does have poor appetite.  Past Medical History:  Diagnosis Date   Asthma    Diabetes mellitus ([place])    Environmental allergies    Hypertension     Past Surgical History:  Procedure Laterality Date   APPENDECTOMY     APPLICATION OF WOUND VAC Right 05/14/2018   Procedure: APPLICATION OF WOUND VAC;  Surgeon: Benjamine Sprague, DO;  Location: [facility] ORS;  Service: General;  Laterality: Right;   INCISION AND DRAINAGE ABSCESS Right 05/14/2018   Procedure: INCISION AND DRAINAGE ABSCESS RIGHT GROIN;  Surgeon: Benjamine Sprague, DO;  Location: [facility] ORS;  Service: General;  Laterality: Right;   INGUINAL HERNIA REPAIR  03/09/2018   Procedure: HERNIA REPAIR INGUINAL INCARCERATED;  Surgeon: Benjamine Sprague, DO;  Location: [facility] ORS;  Service: General;;   INSERTION OF MESH  03/09/2018   Procedure: INSERTION OF MESH;  Surgeon: Benjamine Sprague, DO;  Location: [facility] ORS;  Service: General;;   LAPAROSCOPY N/A 03/09/2018   Procedure: LAPAROSCOPY DIAGNOSTIC;  Surgeon: Benjamine Sprague, DO;  Location: [facility] ORS;  Service: General;  Laterality: N/A;   TUBAL LIGATION      Allergies Ace inhibitors, Hydrochlorothiazide w-triamterene, Lisinopril, Penicillins, and Trichlormethiazide   Review of Systems Constitutional: Negative for fever. Cardiovascular: Negative for chest pain. Respiratory: Negative for shortness of breath. Gastrointestinal: Negative for abdominal pain, vomiting and diarrhea. Musculoskeletal: Negative for back pain. Skin: Negative for rash. Neurological: Positive for  generalized weakness  All systems negative/normal/unremarkable except as stated in the HPI  ____________________________________________   PHYSICAL EXAM:  VITAL SIGNS: There were no vitals filed for this visit.  Constitutional: Alert and oriented.  Thin, no distress Eyes: Conjunctivae are normal. Normal extraocular movements. ENT      Head: Normocephalic and atraumatic.      Nose: No congestion/rhinnorhea.      Mouth/Throat: Mucous membranes are moist.      Neck: No stridor. Cardiovascular: Normal rate, regular rhythm. No murmurs, rubs, or gallops. Respiratory: Normal respiratory effort without tachypnea nor retractions. Breath sounds are clear and equal bilaterally. No wheezes/rales/rhonchi. Gastrointestinal: Soft and nontender. Normal bowel sounds Musculoskeletal: Nontender with normal range of motion in extremities. No lower extremity tenderness nor edema. Neurologic:  Normal speech and language.  Weakness in the lower extremities Skin:  Skin is warm, dry and intact. No rash noted. Psychiatric: Speech and behavior are normal.  ____________________________________________  EKG: Interpreted by me.  Sinus rhythm with rate of 63 bpm, likely LVH, normal axis, normal QT  ____________________________________________   LABS (pertinent positives/negatives)  Labs Reviewed  CBC WITH DIFFERENTIAL/PLATELET - Abnormal; Notable for the following components:      Result Value   RBC 1.80 (*)    Hemoglobin 5.9 (*)    HCT 18.6 (*)    MCV 103.3 (*)    RDW 17.7 (*)    nRBC 0.4 (*)    All other components within normal limits  COMPREHENSIVE METABOLIC PANEL - Abnormal; Notable for the following components:   Chloride 117 (*)    CO2  21 (*)    Glucose, Bld 140 (*)    BUN 73 (*)    Creatinine, Ser 2.54 (*)    Albumin 3.1 (*)    GFR calc non Af Amer 15 (*)    GFR calc Af Amer 17 (*)    All other components within normal limits  GLUCOSE, CAPILLARY - Abnormal; Notable for the following  components:   Glucose-Capillary 115 (*)    All other components within normal limits  TROPONIN I (HIGH SENSITIVITY) - Abnormal; Notable for the following components:   Troponin I (High Sensitivity) 47 (*)    All other components within normal limits  SARS CORONAVIRUS 2 BY RT PCR (HOSPITAL ORDER, [facility])  URINALYSIS, COMPLETE (UACMP) WITH MICROSCOPIC  CBG MONITORING, ED  PREPARE RBC (CROSSMATCH)  TYPE AND SCREEN   CRITICAL CARE Performed by: Laurence Aly   Total critical care time: 30 minutes  Critical care time was exclusive of separately billable procedures and treating other patients.  Critical care was necessary to treat or prevent imminent or life-threatening deterioration.  Critical care was time spent personally by me on the following activities: development of treatment plan with patient and/or surrogate as well as nursing, discussions with consultants, evaluation of patient's response to treatment, examination of patient, obtaining history from patient or surrogate, ordering and performing treatments and interventions, ordering and review of laboratory studies, ordering and review of radiographic studies, pulse oximetry and re-evaluation of patient's condition.  RADIOLOGY  Images were viewed by me CXR IMPRESSION: Cardiomegaly with vascular congestion, pulmonary edema, and small to moderate bilateral pleural effusions. Basilar consolidations may reflect atelectasis or pneumonia. CT head IMPRESSION: No evidence of acute intracranial abnormality.  Redemonstrated chronic lacunar infarct within the posterior limb of left internal capsule.  Stable background mild generalized parenchymal atrophy and chronic small vessel ischemic disease.  Paranasal sinus disease as described. Correlate for acute sinusitis.  Right mastoid effusion.  DIFFERENTIAL DIAGNOSIS  CVA, MI, dehydration, electrolyte normality, occult  infection  ASSESSMENT AND PLAN  Weakness, anemia, chronic kidney disease, heme positive stool, pulmonary edema   Plan: The patient had presented for generalized weakness, worse in her legs. Patient's labs reveal chronic kidney disease, as well as acute on chronic anemia.  Anemia is probably worse than expected with chronic kidney disease and this is possibly due to her heme positive stool.  Rectal was faintly heme positive.  Troponin elevation likely demand related.  After lengthy discussion with the family and nephrology we have decided to give her a blood transfusion of 1 unit with Lasix and see how she does.  She is not a candidate for dialysis.  Lenise Arena MD    Note: This note was generated in part or whole with voice recognition software. Voice recognition is usually quite accurate but there are transcription errors that can and very often do occur. I apologize for any typographical errors that were not detected and corrected.     Earleen Newport, MD 01/31/20 [phone]

## 2020-01-31 NOTE — ED Triage Notes (Signed)
Patient arrived with [facility] from home, patient complaint of generalized weakness mainly in both legs. Patient says other than that she feels ok, her BP was 190/81 and the family called patient doctor office and they told her to come to the ED. Patient has hx of HBP and takes 3 different blood pressure medicines. NAD noted

## 2020-01-31 NOTE — ED Notes (Signed)
Per blood bank, type and screen hemolyzed. Will come collect specimen.

## 2020-01-31 NOTE — H&P (Signed)
[place] [facility] [place] NAME: Tara [name]    MR#:  [id number]  DATE OF BIRTH:  1918-02-10  DATE OF ADMISSION:  01/31/2020  PRIMARY CARE PHYSICIAN: Einar Pheasant, MD   REQUESTING/REFERRING PHYSICIAN: Lenise Arena, MD CHIEF COMPLAINT:   Chief Complaint  Patient presents with  . Weakness    HISTORY OF PRESENT ILLNESS:  Tara [name]  is a 86 y.o. African-American female with a known history of asthma, hypertension and type 2 diabetes mellitus, who presented to the emergency room with acute onset of generalized weakness over the last few days that significantly worsened over the last couple of days.  She denied any nausea or vomiting or abdominal pain.  She has been having mild dyspnea on exertion occasional cough without worsening lower extremity edema.  No orthopnea or paroxysmal nocturnal dyspnea.  She has been constipated.  Per her daughter who gives most of the history her urine has been strong and smelly.  No fever or chills.  No dysuria, oliguria or hematuria or flank pain.  Upon presentation to the emergency room blood pressure was 189/62 with otherwise normal vital signs.  Labs revealed a BUN of 73 and creatinine of 2.54 compared to 57/2.31 on 06/20/2019, chloride 117 and albumin 3.1 with total protein 6.7.  High-sensitivity troponin I was 47 and CBC showed hemoglobin of 5.9 and hematocrit 18.6 with macrocytosis and platelets of 188 with WBC of 4.8.  Chest x-ray showed cardiomegaly with vascular congestion and pulmonary edema and small to moderate bilateral pleural effusions.  Basal consolidations may reflect atelectasis or pneumonia.  Noncontrasted head CT scan revealed no evidence for acute intracranial abnormalities.  It showed chronic lacunar infarct within the posterior limb of the left internal capsule and stable background mild generalized parenchymal atrophy and chronic small vessel ischemic disease as well as paranasal sinus disease and right  mastoid effusion.  She had positive stool Hemoccult in the ER.  The patient was given 40 mg of IV Lasix.  She was typed and screened and will be transfused 1 unit of packed red blood cells.  She will be admitted to a progressive unit bed for further evaluation and management. PAST MEDICAL HISTORY:   Past Medical History:  Diagnosis Date  . Asthma   . Diabetes mellitus ([place])   . Environmental allergies   . Hypertension     PAST SURGICAL HISTORY:   Past Surgical History:  Procedure Laterality Date  . APPENDECTOMY    . APPLICATION OF WOUND VAC Right 05/14/2018   Procedure: APPLICATION OF WOUND VAC;  Surgeon: Benjamine Sprague, DO;  Location: [facility] ORS;  Service: General;  Laterality: Right;  . INCISION AND DRAINAGE ABSCESS Right 05/14/2018   Procedure: INCISION AND DRAINAGE ABSCESS RIGHT GROIN;  Surgeon: Benjamine Sprague, DO;  Location: [facility] OR[facility];  Service: General;  Laterality: Right;  . INGUINAL HERNIA REPAIR  03/09/2018   Procedure: HERNIA REPAIR INGUINAL INCARCERATED;  Surgeon: Benjamine Sprague, DO;  Location: [facility] OR[facility];  Service: General;;  . INSERTION OF MESH  03/09/2018   Procedure: INSERTION OF MESH;  Surgeon: Benjamine Sprague, DO;  Location: [facility] ORS;  Service: General;;  . LAPAROSCOPY N/A 03/09/2018   Procedure: LAPAROSCOPY DIAGNOSTIC;  Surgeon: Benjamine Sprague, DO;  Location: [facility] ORS;  Service: General;  Laterality: N/A;  . TUBAL LIGATION      SOCIAL HISTORY:   Social History   Tobacco Use  . Smoking status: Never Smoker  . Smokeless tobacco: Never Used  Substance Use  Topics  . Alcohol use: No    Alcohol/week: 0.0 standard drinks    FAMILY HISTORY:   Family History  Problem Relation Age of Onset  . Hypertension Mother   . Cancer Brother        lung (question)  . Cancer Sister        questionable type    DRUG ALLERGIES:   Allergies  Allergen Reactions  . Ace Inhibitors Other (See Comments)    Angioedema  . Hydrochlorothiazide W-Triamterene Other (See Comments)    Gout flare    . Lisinopril Swelling and Other (See Comments)  . Penicillins Swelling    Has patient had a PCN reaction causing immediate rash, facial/tongue/throat swelling, SOB or lightheadedness with hypotension: Unknown Has patient had a PCN reaction causing severe rash involving mucus membranes or skin necrosis: Unknown Has patient had a PCN reaction that required hospitalization: Unknown Has patient had a PCN reaction occurring within the last 10 years: No If all of the above answers are "NO", then may proceed with Cephalosporin use.   . Trichlormethiazide Other (See Comments)    Gout flare    REVIEW OF SYSTEMS:   ROS As per history of present illness. All pertinent systems were reviewed above. Constitutional,  HEENT, cardiovascular, respiratory, GI, GU, musculoskeletal, neuro, psychiatric, endocrine,  integumentary and hematologic systems were reviewed and are otherwise  negative/unremarkable except for positive findings mentioned above in the HPI.   MEDICATIONS AT HOME:   Prior to Admission medications   Medication Sig Start Date End Date Taking? Authorizing Provider  ACCU-CHEK AVIVA PLUS test strip CHECK SUGAR ONCE DAILY 09/12/19   Einar Pheasant, MD  Accu-Chek Softclix Lancets lancets Use as instructed to check blood sugars daily. Dx E11.9 10/25/19   Einar Pheasant, MD  acetaminophen (TYLENOL) 325 MG tablet Take 650 mg by mouth every 6 (six) hours as needed for mild pain.     [provider]  amLODipine (NORVASC) 10 MG tablet TAKE 1 TABLET BY MOUTH EVERY DAY 01/23/20   Einar Pheasant, MD  aspirin (ASPIRIN 81) 81 MG EC tablet Take 81 mg by mouth daily. Swallow whole.    [provider]  Calcium Carbonate-Vitamin D (CALTRATE 600+D PO) Take 1 tablet by mouth daily.     [provider]  cloNIDine (CATAPRES) 0.1 MG tablet TAKE 1 TABLET BY MOUTH 3 TIMES DAILY. 09/12/19   Einar Pheasant, MD  fexofenadine (ALLEGRA) 180 MG tablet TAKE 1 TABLET BY MOUTH EVERY DAY 01/27/20    Einar Pheasant, MD  hydrALAZINE (APRESOLINE) 100 MG tablet Take 1 tablet (100 mg total) by mouth 3 (three) times daily. 01/25/20   Einar Pheasant, MD  Insulin Pen Needle (PEN NEEDLES) 32G X 4 MM MISC 1 application by Does not apply route daily. 10/21/19   Einar Pheasant, MD  labetalol (NORMODYNE) 100 MG tablet TAKE 1 TABLET BY MOUTH TWICE A DAY 08/01/19   Einar Pheasant, MD  SYNTHROID 50 MCG tablet TAKE 1 TABLET BY MOUTH EVERY DAY 10/31/19   Einar Pheasant, MD      VITAL SIGNS:  Blood pressure (!) 189/62, pulse 65, temperature 98.3 F (36.8 C), temperature source Oral, resp. rate 20, height 4\' 11"  (1.499 m), weight 37.2 kg, SpO2 99 %.  PHYSICAL EXAMINATION:  Physical Exam  GENERAL:  86 y.o.-year-old African-American female patient lying in the bed with no acute distress.  EYES: Pupils equal, round, reactive to light and accommodation. No scleral icterus. Extraocular muscles intact.  HEENT: Head atraumatic, normocephalic. Oropharynx and  nasopharynx clear.  NECK:  Supple, no jugular venous distention. No thyroid enlargement, no tenderness.  LUNGS: Slightly diminished bibasal breath sounds with mild bibasal rales. CARDIOVASCULAR: Regular rate and rhythm, S1, S2 normal. No murmurs, rubs, or gallops.  ABDOMEN: Soft, nondistended, nontender. Bowel sounds present. No organomegaly or mass.  EXTREMITIES: No pedal edema, cyanosis, or clubbing.  NEUROLOGIC: Cranial nerves II through XII are intact. Muscle strength 5/5 in all extremities. Sensation intact. Gait not checked.  PSYCHIATRIC: The patient is alert and cooperative with good eye contact.   SKIN: No obvious rash, lesion, or ulcer.   LABORATORY PANEL:   CBC Recent Labs  Lab 01/31/20 1738  WBC 4.8  HGB 5.9*  HCT 18.6*  PLT 188   ------------------------------------------------------------------------------------------------------------------  Chemistries  Recent Labs  Lab 01/31/20 1738  NA 144  K 4.7  CL 117*  CO2 21*    GLUCOSE 140*  BUN 73*  CREATININE 2.54*  CALCIUM 9.6  AST 24  ALT 11  ALKPHOS 39  BILITOT 0.8   ------------------------------------------------------------------------------------------------------------------  Cardiac Enzymes No results for input(s): TROPONINI in the last 168 hours. ------------------------------------------------------------------------------------------------------------------  RADIOLOGY:  DG Chest 1 View  Result Date: 01/31/2020 CLINICAL DATA:  Weakness EXAM: CHEST  1 VIEW COMPARISON:  05/26/2018 FINDINGS: Small moderate bilateral pleural effusions. Basilar consolidations. Cardiomegaly with vascular congestion and diffuse interstitial and hazy lung opacity likely edema. Aortic atherosclerosis. No pneumothorax. IMPRESSION: Cardiomegaly with vascular congestion, pulmonary edema, and small to moderate bilateral pleural effusions. Basilar consolidations may reflect atelectasis or pneumonia. Electronically Signed   By: Donavan Foil M.D.   On: 01/31/2020 17:53   CT Head Wo Contrast  Result Date: 01/31/2020 CLINICAL DATA:  Encephalopathy. Additional history provided: Generalized weakness, mainly in both legs. EXAM: CT HEAD WITHOUT CONTRAST TECHNIQUE: Contiguous axial images were obtained from the base of the skull through the vertex without intravenous contrast. COMPARISON:  Head CT 06/12/2018 FINDINGS: Brain: Stable, mild generalized parenchymal atrophy. Redemonstrated chronic lacunar infarct within the posterior limb of left internal capsule. Unchanged mild background ill-defined hypoattenuation within the cerebral white matter which is nonspecific, but consistent with chronic small vessel ischemic disease. There is no acute intracranial hemorrhage. No demarcated cortical infarct is identified. No extra-axial fluid collection. No evidence of intracranial mass. No midline shift. Vascular: No hyperdense vessel.  Atherosclerotic calcifications. Skull: Normal. Negative for  fracture or focal lesion. Sinuses/Orbits: Visualized orbits show no acute finding. Frothy secretions throughout the left sphenoid sinus. Minimal ethmoid sinus mucosal thickening. Prior postsurgical changes to the right mastoid air cells are questioned. There is a right mastoid effusion. The left mastoid air cells are clear at the imaged levels. IMPRESSION: No evidence of acute intracranial abnormality. Redemonstrated chronic lacunar infarct within the posterior limb of left internal capsule. Stable background mild generalized parenchymal atrophy and chronic small vessel ischemic disease. Paranasal sinus disease as described. Correlate for acute sinusitis. Right mastoid effusion. Electronically Signed   By: Kellie Simmering DO   On: 01/31/2020 17:45      IMPRESSION AND PLAN:   1.  GI bleeding with acute blood loss anemia on top of anemia of chronic disease. -The patient will be admitted to a progressive unit bed. -She was typed and crossmatch will be transfused 1 unit of packed red blood cells. -We will follow posttransfusion H&H as well as serial levels. -We will place her on IV Protonix with bolus and drip. -Aspirin will be held off. -GI consultation will be obtained. -I notified Dr. Marius Ditch about the patient.  2.  Hypertensive urgency. -We will continue hydralazine, Catapres, and labetalol as well as Norvasc. -We will place on as needed IV labetalol.  3.  Acute new onset likely diastolic CHF with pulmonary edema.  This could be secondary to hypertensive urgency and high-output failure. -She will be diuresed with IV Lasix before and after transfusion. -We will follow serial troponin I's. -We will obtain a 2D echo. -We will obtain a cardiology consultation. -I notified Dr. Humphrey Rolls about the patient.  4.  Acute kidney injury superimposed on stage IIIa chronic kidney disease. -This like secondary to acute CHF.  It is exacerbated by her GI bleeding. -We will diurese with IV Lasix and transfuse her  and follow BMPs.  5.  Hypothyroidism. -We will continue Synthroid and check TSH level.  6.  Type 2 diabetes mellitus. -She will be placed on supplemental coverage with NovoLog.  7.  DVT prophylaxis. -SCDs. -Medical prophylaxis currently contraindicated due to GI bleeding.  8.  GI prophylaxis. -She will be on IV Protonix.    All the records are reviewed and case discussed with ED provider. The plan of care was discussed in details with the patient and her daughter.  I answered all questions. The patient agreed to proceed with the above mentioned plan. Further management will depend upon hospital course.   CODE STATUS: This was discussed with the patient's daughter.  She will further discuss with the patient and the family.  Earlier her brother wanted to maintain his mother as full code  Status is: Inpatient  Remains inpatient appropriate because:Ongoing diagnostic testing needed not appropriate for outpatient work up, Unsafe d/c plan, IV treatments appropriate due to intensity of illness or inability to take PO and Inpatient level of care appropriate due to severity of illness   Dispo: The patient is from: Home              Anticipated d/c is to: Home              Anticipated d/c date is: 2 days              Patient currently is not medically stable to d/c.   TOTAL TIME TAKING CARE OF THIS PATIENT: 55 minutes.    Christel Mormon M.D on 01/31/2020 at 7:50 PM  [facility]   From 7 PM-7 AM, contact night-coverage [email or web address]  CC: Primary care physician; Einar Pheasant, MD   Note: This dictation was prepared with Dragon dictation along with smaller phrase technology. Any transcriptional typo errors that result from this process are unintentional.

## 2020-01-31 NOTE — Telephone Encounter (Signed)
[name] called and states that pt's blood pressure was 210/101. Pt is weak, short of breath, and can not walk. Is not eating or drinking anything. Transferred to [place] at [facility]

## 2020-02-01 ENCOUNTER — Encounter: Payer: Self-pay | Admitting: Family Medicine

## 2020-02-01 DIAGNOSIS — L8990 Pressure ulcer of unspecified site, unspecified stage: Secondary | ICD-10-CM | POA: Insufficient documentation

## 2020-02-01 LAB — BPAM RBC
Blood Product Expiration Date: 202107202359
ISSUE DATE / TIME: 202106222145
Unit Type and Rh: 6200

## 2020-02-01 LAB — TYPE AND SCREEN
ABO/RH(D): A POS
Antibody Screen: NEGATIVE
Unit division: 0

## 2020-02-01 LAB — BASIC METABOLIC PANEL
Anion gap: 8 (ref 5–15)
BUN: 72 mg/dL — ABNORMAL HIGH (ref 8–23)
CO2: 20 mmol/L — ABNORMAL LOW (ref 22–32)
Calcium: 9.6 mg/dL (ref 8.9–10.3)
Chloride: 117 mmol/L — ABNORMAL HIGH (ref 98–111)
Creatinine, Ser: 2.5 mg/dL — ABNORMAL HIGH (ref 0.44–1.00)
GFR calc Af Amer: 18 mL/min — ABNORMAL LOW (ref >60)
GFR calc non Af Amer: 15 mL/min — ABNORMAL LOW (ref >60)
Glucose, Bld: 144 mg/dL — ABNORMAL HIGH (ref 70–99)
Potassium: 4.6 mmol/L (ref 3.5–5.1)
Sodium: 145 mmol/L (ref 135–145)

## 2020-02-01 LAB — CBC
HCT: 28.7 % — ABNORMAL LOW (ref 36.0–46.0)
Hemoglobin: 9.3 g/dL — ABNORMAL LOW (ref 12.0–15.0)
MCH: 31.6 pg (ref 26.0–34.0)
MCHC: 32.4 g/dL (ref 30.0–36.0)
MCV: 97.6 fL (ref 80.0–100.0)
Platelets: 199 10*3/uL (ref 150–400)
RBC: 2.94 MIL/uL — ABNORMAL LOW (ref 3.87–5.11)
RDW: 18.5 % — ABNORMAL HIGH (ref 11.5–15.5)
WBC: 4.8 10*3/uL (ref 4.0–10.5)
nRBC: 0.4 % — ABNORMAL HIGH (ref 0.0–0.2)

## 2020-02-01 LAB — HEMOGLOBIN AND HEMATOCRIT, BLOOD
HCT: 31.7 % — ABNORMAL LOW (ref 36.0–46.0)
HCT: 27.2 % — ABNORMAL LOW (ref 36.0–46.0)
Hemoglobin: 10.7 g/dL — ABNORMAL LOW (ref 12.0–15.0)
Hemoglobin: 9.2 g/dL — ABNORMAL LOW (ref 12.0–15.0)

## 2020-02-01 LAB — GLUCOSE, CAPILLARY
Glucose-Capillary: 115 mg/dL — ABNORMAL HIGH (ref 70–99)
Glucose-Capillary: 155 mg/dL — ABNORMAL HIGH (ref 70–99)
Glucose-Capillary: 149 mg/dL — ABNORMAL HIGH (ref 70–99)
Glucose-Capillary: 196 mg/dL — ABNORMAL HIGH (ref 70–99)

## 2020-02-01 LAB — MAGNESIUM: Magnesium: 2.1 mg/dL (ref 1.7–2.4)

## 2020-02-01 LAB — ECHOCARDIOGRAM COMPLETE
Height: 59 in
Weight: 1312.18 oz

## 2020-02-01 LAB — T4, FREE: Free T4: 1.3 ng/dL — ABNORMAL HIGH (ref 0.61–1.12)

## 2020-02-01 LAB — TSH: TSH: 7.72 u[IU]/mL — ABNORMAL HIGH (ref 0.350–4.500)

## 2020-02-01 MED ORDER — FUROSEMIDE 10 MG/ML IJ SOLN
60.0000 mg | Freq: Two times a day (BID) | INTRAMUSCULAR | Status: DC
  Administered 2020-02-01 – 2020-02-02 (×3): 60 mg via INTRAVENOUS
  Filled 2020-02-01 (×3): qty 6

## 2020-02-01 MED ORDER — HYDRALAZINE HCL 20 MG/ML IJ SOLN
10.0000 mg | Freq: Once | INTRAMUSCULAR | Status: AC
  Administered 2020-02-01: 10 mg via INTRAVENOUS
  Filled 2020-02-01: qty 1

## 2020-02-01 MED ORDER — NICARDIPINE HCL IN NACL 20-0.86 MG/200ML-% IV SOLN
3.0000 mg/h | INTRAVENOUS | Status: DC
  Administered 2020-02-01 – 2020-02-03 (×4): 5 mg/h via INTRAVENOUS
  Administered 2020-02-04: 1 mg/h via INTRAVENOUS
  Filled 2020-02-01 (×4): qty 200

## 2020-02-01 MED ORDER — LEVOTHYROXINE SODIUM 50 MCG PO TABS
50.0000 ug | ORAL_TABLET | Freq: Every day | ORAL | Status: DC
  Administered 2020-02-02 – 2020-02-09 (×8): 50 ug via ORAL
  Filled 2020-02-01 (×8): qty 1

## 2020-02-01 MED ORDER — CHLORHEXIDINE GLUCONATE CLOTH 2 % EX PADS
6.0000 | MEDICATED_PAD | Freq: Every day | CUTANEOUS | Status: DC
  Administered 2020-02-01 – 2020-02-07 (×7): 6 via TOPICAL

## 2020-02-01 MED ORDER — FUROSEMIDE 10 MG/ML IJ SOLN
80.0000 mg | Freq: Once | INTRAMUSCULAR | Status: AC
  Administered 2020-02-01: 80 mg via INTRAVENOUS
  Filled 2020-02-01: qty 8

## 2020-02-01 NOTE — Progress Notes (Signed)
Cross Cover Brief Note Patient without adequate improvement in blood pressures after IV labetalol, furosemide and hydralazine.  No neuro changes. No chest pain. Bradycardia limiting ability to utilize more BB therapy.  Starting Cardene drip. Transfer to step down.  TSH elevation. Free T4, T3 added to am labs

## 2020-02-01 NOTE — Telephone Encounter (Signed)
Tara [name] stated that pt is doing some better today. Patient received a unit of blood and hgb is up to 10.7 today. Changed bp meds. Bp was 127/66. Advised daughter to let us know if they need anything.

## 2020-02-01 NOTE — Telephone Encounter (Signed)
Reviewed note.  Pt in hospital.  Please call pts daughter for update.

## 2020-02-01 NOTE — Plan of Care (Signed)
  Problem: Education: Goal: Knowledge of General Education information will improve Description Including pain rating scale, medication(s)/side effects and non-pharmacologic comfort measures Outcome: Progressing   

## 2020-02-01 NOTE — Progress Notes (Addendum)
Progress Note    Tara [name]  NAT:[id number] DOB: 05/11/1918  DOA: 01/31/2020 PCP: Einar Pheasant, MD      Brief Narrative:    Medical records reviewed and are as summarized below:  Tara [name] is a 86 y.o. female  with a known history of asthma, hypertension and type 2 diabetes mellitus, who presented to the emergency room with acute onset of generalized weakness, cough, exertional shortness of breath.      Assessment/Plan:   Active Problems:   GI bleeding   Pressure injury of skin   1.  GI bleeding with acute blood loss anemia on top of anemia of chronic disease. S/p fusion with 1 unit of packed red blood cells.  H&H has improved.  Patient was evaluated by gastroenterologist who conservative management was recommended.  Patient's daughter does not want any invasive procedures.  Change IV to oral Protonix.  Advance diet to low-salt diet.  2.  Hypertensive emergency Patient was treated with IV nicardipine infusion.   Home oral antihypertensives have been resumed.  Taper off nicardipine infusion as able.  3.  Acute new onset likely diastolic CHF with pulmonary edema.  This could be secondary to hypertensive urgency and high-output failure. Continue IV Lasix.  Monitor daily weights, BMP and urine output.  Patient has been evaluated by cardiologist.  2D echo showed EF estimated >42%, grade 1 diastolic dysfunction, moderately elevated pulmonary artery systolic pressure.  4.    Stage IV CKD -Creatinine is is around baseline of 2.3-2.4.  Monitor BMP.  5.  Hypothyroidism. Continue Synthroid  6.  Type 2 diabetes mellitus. Follow-up as needed for hyperglycemia  7.  DVT prophylaxis. -SCDs. -Medical prophylaxis currently contraindicated due to GI bleeding.    Body mass index is 15.67 kg/m.  Diet Order            Diet 2 gram sodium Room service appropriate? Yes; Fluid consistency: Thin  Diet effective now                       Medications:    . amLODipine  10 mg Oral Daily  . aspirin EC  81 mg Oral Daily  . Chlorhexidine Gluconate Cloth  6 each Topical Daily  . cloNIDine  0.1 mg Oral TID  . furosemide  60 mg Intravenous BID  . hydrALAZINE  100 mg Oral TID  . insulin aspart  0-9 Units Subcutaneous TID PC & HS  . labetalol  100 mg Oral BID  . [START ON 02/02/2020] levothyroxine  50 mcg Oral Q0600  . loratadine  10 mg Oral Daily  . [START ON 02/04/2020] pantoprazole  40 mg Intravenous Q12H   Continuous Infusions: . niCARDipine 5 mg/hr (02/01/20 1139)     Anti-infectives (From admission, onward)   None             Family Communication/Anticipated D/C date and plan/Code Status   DVT prophylaxis: SCDs Start: 01/31/20 1948     Code Status: Full Code  Family Communication: Plan discussed with her daughter at the bedside Disposition Plan:    Status is: Inpatient  Remains inpatient appropriate because:IV treatments appropriate due to intensity of illness or inability to take PO and Inpatient level of care appropriate due to severity of illness   Dispo: The patient is from: Home              Anticipated d/c is to: Home  Anticipated d/c date is: 2 days              Patient currently is not medically stable to d/c.           Subjective:   No shortness of breath or chest pain.  No hematemesis or rectal bleeding  Objective:    Vitals:   02/01/20 0600 02/01/20 0645 02/01/20 0753 02/01/20 1000  BP:  (!) 193/136 124/63 126/65  Pulse:  (!) 59 63 65  Resp: 11 17 15 15   Temp:   (!) 97.5 F (36.4 C) 97.6 F (36.4 C)  TempSrc:   Axillary Axillary  SpO2:   100% 99%  Weight:  36.4 kg    Height:  5' (1.524 m)     No data found.   Intake/Output Summary (Last 24 hours) at 02/01/2020 1708 Last data filed at 02/01/2020 0406 Gross per 24 hour  Intake 763.09 ml  Output 300 ml  Net 463.09 ml   Filed Weights   02/01/20 0026 02/01/20 0359 02/01/20 0645  Weight: 37 kg 36.6 kg 36.4 kg     Exam:  GEN: NAD SKIN: No rash EYES: EOMI ENT: MMM CV: RRR PULM: CTA B ABD: soft, ND, NT, +BS CNS: AAO x 3, non focal EXT: No edema. B/l leg tenderness (increased sensitivity to touch)   Data Reviewed:   I have personally reviewed following labs and imaging studies:  Labs: Labs show the following:   Basic Metabolic Panel: Recent Labs  Lab 01/31/20 1738 02/01/20 0224  NA 144 145  K 4.7 4.6  CL 117* 117*  CO2 21* 20*  GLUCOSE 140* 144*  BUN 73* 72*  CREATININE 2.54* 2.50*  CALCIUM 9.6 9.6  MG  --  2.1   GFR Estimated Creatinine Clearance: 7 mL/min (A) (by C-G formula based on SCr of 2.5 mg/dL (H)). Liver Function Tests: Recent Labs  Lab 01/31/20 1738  AST 24  ALT 11  ALKPHOS 39  BILITOT 0.8  PROT 6.7  ALBUMIN 3.1*   No results for input(s): LIPASE, AMYLASE in the last 168 hours. No results for input(s): AMMONIA in the last 168 hours. Coagulation profile No results for input(s): INR, PROTIME in the last 168 hours.  CBC: Recent Labs  Lab 01/31/20 1738 01/31/20 2033 02/01/20 0224 02/01/20 0844 02/01/20 1419  WBC 4.8  --  4.8  --   --   NEUTROABS 3.1  --   --   --   --   HGB 5.9* 6.5* 9.3* 10.7* 9.2*  HCT 18.6* 20.0* 28.7* 31.7* 27.2*  MCV 103.3*  --  97.6  --   --   PLT 188  --  199  --   --    Cardiac Enzymes: No results for input(s): CKTOTAL, CKMB, CKMBINDEX, TROPONINI in the last 168 hours. BNP (last 3 results) No results for input(s): PROBNP in the last 8760 hours. CBG: Recent Labs  Lab 01/31/20 1847 02/01/20 0717 02/01/20 1126 02/01/20 1630  GLUCAP 115* 115* 155* 149*   D-Dimer: No results for input(s): DDIMER in the last 72 hours. Hgb A1c: No results for input(s): HGBA1C in the last 72 hours. Lipid Profile: No results for input(s): CHOL, HDL, LDLCALC, TRIG, CHOLHDL, LDLDIRECT in the last 72 hours. Thyroid function studies: Recent Labs    02/01/20 0224  TSH 7.720*   Anemia work up: No results for input(s): VITAMINB12,  FOLATE, FERRITIN, TIBC, IRON, RETICCTPCT in the last 72 hours. Sepsis Labs: Recent Labs  Lab 01/31/20 1738 02/01/20  0224  WBC 4.8 4.8    Microbiology Recent Results (from the past 240 hour(s))  SARS Coronavirus 2 by RT PCR (hospital order, performed in [facility] hospital lab) Nasopharyngeal Nasopharyngeal Swab     Status: None   Collection Time: 01/31/20  7:11 PM   Specimen: Nasopharyngeal Swab  Result Value Ref Range Status   SARS Coronavirus 2 NEGATIVE NEGATIVE Final    Comment: (NOTE) SARS-CoV-2 target nucleic acids are NOT DETECTED.  The SARS-CoV-2 RNA is generally detectable in upper and lower respiratory specimens during the acute phase of infection. The lowest concentration of SARS-CoV-2 viral copies this assay can detect is 250 copies / mL. A negative result does not preclude SARS-CoV-2 infection and should not be used as the sole basis for treatment or other patient management decisions.  A negative result may occur with improper specimen collection / handling, submission of specimen other than nasopharyngeal swab, presence of viral mutation(s) within the areas targeted by this assay, and inadequate number of viral copies (<250 copies / mL). A negative result must be combined with clinical observations, patient history, and epidemiological information.  Fact Sheet for Patients:   StrictlyIdeas.no  Fact Sheet for Healthcare Providers: BankingDealers.co.za  This test is not yet approved or  cleared by the [place] FDA and has been authorized for detection and/or diagnosis of SARS-CoV-2 by FDA under an Emergency Use Authorization (EUA).  This EUA will remain in effect (meaning this test can be used) for the duration of the COVID-19 declaration under Section 564(b)(1) of the Act, 21 U.S.C. section 360bbb-3(b)(1), unless the authorization is terminated or revoked sooner.  Performed at [facility], [address] [place], [place] [address]     Procedures and diagnostic studies:  DG Chest 1 View  Result Date: 01/31/2020 CLINICAL DATA:  Weakness EXAM: CHEST  1 VIEW COMPARISON:  05/26/2018 FINDINGS: Small moderate bilateral pleural effusions. Basilar consolidations. Cardiomegaly with vascular congestion and diffuse interstitial and hazy lung opacity likely edema. Aortic atherosclerosis. No pneumothorax. IMPRESSION: Cardiomegaly with vascular congestion, pulmonary edema, and small to moderate bilateral pleural effusions. Basilar consolidations may reflect atelectasis or pneumonia. Electronically Signed   By: Donavan Foil M.D.   On: 01/31/2020 17:53   CT Head Wo Contrast  Result Date: 01/31/2020 CLINICAL DATA:  Encephalopathy. Additional history provided: Generalized weakness, mainly in both legs. EXAM: CT HEAD WITHOUT CONTRAST TECHNIQUE: Contiguous axial images were obtained from the base of the skull through the vertex without intravenous contrast. COMPARISON:  Head CT 06/12/2018 FINDINGS: Brain: Stable, mild generalized parenchymal atrophy. Redemonstrated chronic lacunar infarct within the posterior limb of left internal capsule. Unchanged mild background ill-defined hypoattenuation within the cerebral white matter which is nonspecific, but consistent with chronic small vessel ischemic disease. There is no acute intracranial hemorrhage. No demarcated cortical infarct is identified. No extra-axial fluid collection. No evidence of intracranial mass. No midline shift. Vascular: No hyperdense vessel.  Atherosclerotic calcifications. Skull: Normal. Negative for fracture or focal lesion. Sinuses/Orbits: Visualized orbits show no acute finding. Frothy secretions throughout the left sphenoid sinus. Minimal ethmoid sinus mucosal thickening. Prior postsurgical changes to the right mastoid air cells are questioned. There is a right mastoid effusion. The left mastoid air cells are clear at the imaged levels.  IMPRESSION: No evidence of acute intracranial abnormality. Redemonstrated chronic lacunar infarct within the posterior limb of left internal capsule. Stable background mild generalized parenchymal atrophy and chronic small vessel ischemic disease. Paranasal sinus disease as described. Correlate for acute sinusitis. Right  mastoid effusion. Electronically Signed   By: Kellie Simmering DO   On: 01/31/2020 17:45   ECHOCARDIOGRAM COMPLETE  Result Date: 02/01/2020    ECHOCARDIOGRAM REPORT   Patient Name:   LUCINE BILSKI Date of Exam: 01/31/2020 Medical Rec #:  [id number]      Height:       59.0 in Accession #:    [id number]     Weight:       82.0 lb Date of Birth:  [birth year]       BSA:          1.263 m Patient Age:    72 years       BP:           199/69 mmHg Patient Gender: F              HR:           63 bpm. Exam Location:  [facility] Procedure: 2D Echo, Cardiac Doppler and Color Doppler Indications:     Z00.92 Acute Diastolic CHF  History:         Patient has no prior history of Echocardiogram examinations.                  Risk Factors:Hypertension and Diabetes.  Sonographer:     Wilford Sports Rodgers-Jones Referring Phys:  [id number] Boyden Diagnosing Phys: Neoma Laming MD IMPRESSIONS  1. Asymetricle septal hypertophy with IHSS. Left ventricular ejection fraction, by estimation, is >75%. The left ventricle has hyperdynamic function. The left ventricle has no regional wall motion abnormalities. There is severe asymmetric left ventricular hypertrophy of the basal-septal segment. Left ventricular diastolic parameters are consistent with Grade I diastolic dysfunction (impaired relaxation).  2. Right ventricular systolic function is normal. The right ventricular size is normal. There is moderately elevated pulmonary artery systolic pressure.  3. Left atrial size was mildly dilated.  4. The mitral valve is normal in structure. Mild mitral valve regurgitation. No evidence of mitral stenosis.  5. The aortic valve is normal in  structure. Aortic valve regurgitation is mild. Mild to moderate aortic valve sclerosis/calcification is present, without any evidence of aortic stenosis.  6. The inferior vena cava is normal in size with greater than 50% respiratory variability, suggesting right atrial pressure of 3 mmHg. Conclusion(s)/Recommendation(s): Findings consistent with hypertrophic obstructive cardiomyopathy. Due to the above findings, cardiology consult is recommended. FINDINGS  Left Ventricle: Asymetricle septal hypertophy with IHSS. Left ventricular ejection fraction, by estimation, is >75%. The left ventricle has hyperdynamic function. The left ventricle has no regional wall motion abnormalities. The left ventricular internal cavity size was normal in size. There is severe asymmetric left ventricular hypertrophy of the basal-septal segment. Left ventricular diastolic parameters are consistent with Grade I diastolic dysfunction (impaired relaxation). Right Ventricle: The right ventricular size is normal. No increase in right ventricular wall thickness. Right ventricular systolic function is normal. There is moderately elevated pulmonary artery systolic pressure. The tricuspid regurgitant velocity is 3.16 m/s, and with an assumed right atrial pressure of 10 mmHg, the estimated right ventricular systolic pressure is 26.3 mmHg. Left Atrium: Left atrial size was mildly dilated. Right Atrium: Right atrial size was normal in size. Pericardium: There is no evidence of pericardial effusion. Mitral Valve: The mitral valve is normal in structure. Normal mobility of the mitral valve leaflets. Mild mitral valve regurgitation. No evidence of mitral valve stenosis. Tricuspid Valve: The tricuspid valve is normal in structure. Tricuspid valve regurgitation is mild . No evidence of tricuspid  stenosis. Aortic Valve: The aortic valve is normal in structure. Aortic valve regurgitation is mild. Mild to moderate aortic valve sclerosis/calcification is  present, without any evidence of aortic stenosis. Pulmonic Valve: The pulmonic valve was normal in structure. Pulmonic valve regurgitation is mild. No evidence of pulmonic stenosis. Aorta: The aortic root is normal in size and structure. Venous: The inferior vena cava is normal in size with greater than 50% respiratory variability, suggesting right atrial pressure of 3 mmHg. IAS/Shunts: No atrial level shunt detected by color flow Doppler.  LEFT VENTRICLE PLAX 2D LVIDd:         3.14 cm  Diastology LVIDs:         1.68 cm  LV e' lateral:   3.59 cm/s LV PW:         1.57 cm  LV E/e' lateral: 29.2 LV IVS:        1.37 cm  LV e' medial:    5.00 cm/s LVOT diam:     1.80 cm  LV E/e' medial:  21.0 LV SV:         69 LV SV Index:   55 LVOT Area:     2.54 cm  RIGHT VENTRICLE             IVC RV Basal diam:  2.97 cm     IVC diam: 1.64 cm RV S prime:     12.10 cm/s TAPSE (M-mode): 2.4 cm LEFT ATRIUM             Index       RIGHT ATRIUM           Index LA diam:        3.70 cm 2.93 cm/m  RA Area:     11.00 cm LA Vol (A2C):   33.6 ml 26.61 ml/m RA Volume:   22.70 ml  17.98 ml/m LA Vol (A4C):   38.9 ml 30.81 ml/m LA Biplane Vol: 38.7 ml 30.65 ml/m  AORTIC VALVE LVOT Vmax:   87.50 cm/s LVOT Vmean:  72.000 cm/s LVOT VTI:    0.271 m  AORTA Ao Root diam: 2.80 cm MITRAL VALVE                TRICUSPID VALVE MV Area (PHT): 2.34 cm     TR Peak grad:   39.9 mmHg MV Decel Time: 324 msec     TR Vmax:        316.00 cm/s MV E velocity: 105.00 cm/s MV A velocity: 147.00 cm/s  SHUNTS MV E/A ratio:  0.71         Systemic VTI:  0.27 m                             Systemic Diam: 1.80 cm Neoma Laming MD Electronically signed by Neoma Laming MD Signature Date/Time: 02/01/2020/9:12:25 AM    Final                LOS: 1 day   Sephiroth Mcluckie  [facility]     02/01/2020, 5:08 PM

## 2020-02-01 NOTE — Consult Note (Signed)
Tara [name] is a 86 y.o. female  [id number]  Primary [place] Reason for Consultation: CHF  HPI: 10 YOBF presented to [facility] with weakness and SOB. She had GIB and CXR showing congestion  Review of Systems: SOB   Past Medical History:  Diagnosis Date  . Asthma   . Diabetes mellitus ([place])   . Environmental allergies   . Hypertension     Medications Prior to Admission  Medication Sig Dispense Refill  . amLODipine (NORVASC) 10 MG tablet TAKE 1 TABLET BY MOUTH EVERY DAY (Patient taking differently: Take 10 mg by mouth daily. ) 90 tablet 1  . aspirin (ASPIRIN 81) 81 MG EC tablet Take 81 mg by mouth daily. Swallow whole.    . Calcium Carbonate-Vitamin D (CALTRATE 600+D PO) Take 1 tablet by mouth daily.     . cloNIDine (CATAPRES) 0.1 MG tablet TAKE 1 TABLET BY MOUTH 3 TIMES DAILY. (Patient taking differently: Take 0.1 mg by mouth 3 (three) times daily. ) 270 tablet 0  . fexofenadine (ALLEGRA) 180 MG tablet TAKE 1 TABLET BY MOUTH EVERY DAY (Patient taking differently: Take 180 mg by mouth daily. TAKE 1 TABLET BY MOUTH EVERY DAY) 90 tablet 1  . hydrALAZINE (APRESOLINE) 100 MG tablet Take 1 tablet (100 mg total) by mouth 3 (three) times daily. 270 tablet 1  . labetalol (NORMODYNE) 100 MG tablet TAKE 1 TABLET BY MOUTH TWICE A DAY (Patient taking differently: Take 100 mg by mouth 2 (two) times daily. ) 180 tablet 1  . SYNTHROID 50 MCG tablet TAKE 1 TABLET BY MOUTH EVERY DAY (Patient taking differently: Take 50 mcg by mouth daily before breakfast. ) 90 tablet 1  . torsemide (DEMADEX) 20 MG tablet Take 20 mg by mouth daily.    Marland Kitchen ACCU-CHEK AVIVA PLUS test strip CHECK SUGAR ONCE DAILY 100 strip 6  . Accu-Chek Softclix Lancets lancets Use as instructed to check blood sugars daily. Dx E11.9 100 each 12  . acetaminophen (TYLENOL) 325 MG tablet Take 650 mg by mouth every 6 (six) hours as needed for mild pain.     . Insulin Pen Needle (PEN NEEDLES) 32G X 4 MM MISC 1 application by  Does not apply route daily. 100 each 1     . amLODipine  10 mg Oral Daily  . aspirin EC  81 mg Oral Daily  . Chlorhexidine Gluconate Cloth  6 each Topical Daily  . cloNIDine  0.1 mg Oral TID  . furosemide  60 mg Intravenous BID  . hydrALAZINE  100 mg Oral TID  . insulin aspart  0-9 Units Subcutaneous TID PC & HS  . labetalol  100 mg Oral BID  . loratadine  10 mg Oral Daily  . [START ON 02/04/2020] pantoprazole  40 mg Intravenous Q12H    Infusions: . niCARDipine 2.5 mg/hr (02/01/20 0901)  . pantoprozole (PROTONIX) infusion 8 mg/hr (01/31/20 2220)    Allergies  Allergen Reactions  . Ace Inhibitors Other (See Comments)    Angioedema  . Lisinopril Swelling and Other (See Comments)  . Hydrochlorothiazide W-Triamterene Other (See Comments)    Gout flare  . Penicillins Swelling    Has patient had a PCN reaction causing immediate rash, facial/tongue/throat swelling, SOB or lightheadedness with hypotension: Unknown Has patient had a PCN reaction causing severe rash involving mucus membranes or skin necrosis: Unknown Has patient had a PCN reaction that required hospitalization: Unknown Has patient had a PCN reaction occurring within the last 10 years: No If  all of the above answers are "NO", then may proceed with Cephalosporin use.  Other reaction(s): Unknown Has patient had   . Trichlormethiazide Other (See Comments)    Gout flare    Social History   Socioeconomic History  . Marital status: Widowed    Spouse name: Not on file  . Number of children: Not on file  . Years of education: Not on file  . Highest education level: Not on file  Occupational History  . Not on file  Tobacco Use  . Smoking status: Never Smoker  . Smokeless tobacco: Never Used  Vaping Use  . Vaping Use: Never used  Substance and Sexual Activity  . Alcohol use: No    Alcohol/week: 0.0 standard drinks  . Drug use: No  . Sexual activity: Not on file  Other Topics Concern  . Not on file  Social  History Narrative  . Not on file   Social Determinants of Health   Financial Resource Strain:   . Difficulty of Paying Living Expenses:   Food Insecurity:   . Worried About Charity fundraiser in the Last Year:   . Arboriculturist in the Last Year:   Transportation Needs:   . Film/video editor (Medical):   Marland Kitchen Lack of Transportation (Non-Medical):   Physical Activity:   . Days of Exercise per Week:   . Minutes of Exercise per Session:   Stress:   . Feeling of Stress :   Social Connections:   . Frequency of Communication with Friends and Family:   . Frequency of Social Gatherings with Friends and Family:   . Attends Religious Services:   . Active Member of Clubs or Organizations:   . Attends Archivist Meetings:   Marland Kitchen Marital Status:   Intimate Partner Violence:   . Fear of Current or Ex-Partner:   . Emotionally Abused:   Marland Kitchen Physically Abused:   . Sexually Abused:     Family History  Problem Relation Age of Onset  . Hypertension Mother   . Cancer Brother        lung (question)  . Cancer Sister        questionable type    PHYSICAL EXAM: Vitals:   02/01/20 0645 02/01/20 0753  BP: (!) 193/136 124/63  Pulse: (!) 59 63  Resp: 17 15  Temp:  (!) 97.5 F (36.4 C)  SpO2:  100%     Intake/Output Summary (Last 24 hours) at 02/01/2020 0918 Last data filed at 02/01/2020 0406 Gross per 24 hour  Intake 763.09 ml  Output 300 ml  Net 463.09 ml    General:  Well appearing. No respiratory difficulty HEENT: normal Neck: supple. no JVD. Carotids 2+ bilat; no bruits. No lymphadenopathy or thryomegaly appreciated. Cor: PMI nondisplaced. Regular rate & rhythm. No rubs, gallops or murmurs. Lungs: clear Abdomen: soft, nontender, nondistended. No hepatosplenomegaly. No bruits or masses. Good bowel sounds. Extremities: no cyanosis, clubbing, rash, edema Neuro: alert & oriented x 3, cranial nerves grossly intact. moves all 4 extremities w/o difficulty. Affect  pleasant.  ECG: no able to pull  Results for orders placed or performed during the hospital encounter of 01/31/20 (from the past 24 hour(s))  CBC with Differential     Status: Abnormal   Collection Time: 01/31/20  5:38 PM  Result Value Ref Range   WBC 4.8 4.0 - 10.5 K/uL   RBC 1.80 (L) 3.87 - 5.11 MIL/uL   Hemoglobin 5.9 (L) 12.0 - 15.0 g/dL  HCT 18.6 (L) 36 - 46 %   MCV 103.3 (H) 80.0 - 100.0 fL   MCH 32.8 26.0 - 34.0 pg   MCHC 31.7 30.0 - 36.0 g/dL   RDW 17.7 (H) 11.5 - 15.5 %   Platelets 188 150 - 400 K/uL   nRBC 0.4 (H) 0.0 - 0.2 %   Neutrophils Relative % 62 %   Neutro Abs 3.1 1.7 - 7.7 K/uL   Lymphocytes Relative 25 %   Lymphs Abs 1.2 0.7 - 4.0 K/uL   Monocytes Relative 8 %   Monocytes Absolute 0.4 0 - 1 K/uL   Eosinophils Relative 3 %   Eosinophils Absolute 0.1 0 - 0 K/uL   Basophils Relative 1 %   Basophils Absolute 0.0 0 - 0 K/uL   Immature Granulocytes 1 %   Abs Immature Granulocytes 0.03 0.00 - 0.07 K/uL  Comprehensive metabolic panel     Status: Abnormal   Collection Time: 01/31/20  5:38 PM  Result Value Ref Range   Sodium 144 135 - 145 mmol/L   Potassium 4.7 3.5 - 5.1 mmol/L   Chloride 117 (H) 98 - 111 mmol/L   CO2 21 (L) 22 - 32 mmol/L   Glucose, Bld 140 (H) 70 - 99 mg/dL   BUN 73 (H) 8 - 23 mg/dL   Creatinine, Ser 2.54 (H) 0.44 - 1.00 mg/dL   Calcium 9.6 8.9 - 10.3 mg/dL   Total Protein 6.7 6.5 - 8.1 g/dL   Albumin 3.1 (L) 3.5 - 5.0 g/dL   AST 24 15 - 41 U/L   ALT 11 0 - 44 U/L   Alkaline Phosphatase 39 38 - 126 U/L   Total Bilirubin 0.8 0.3 - 1.2 mg/dL   GFR calc non Af Amer 15 (L) >60 mL/min   GFR calc Af Amer 17 (L) >60 mL/min   Anion gap 6 5 - 15  Troponin I (High Sensitivity)     Status: Abnormal   Collection Time: 01/31/20  5:38 PM  Result Value Ref Range   Troponin I (High Sensitivity) 47 (H) <18 ng/L  ABO/Rh     Status: None   Collection Time: 01/31/20  5:38 PM  Result Value Ref Range   ABO/RH(D)      A POS Performed at [facility], [address] [place], [place] [address]   Glucose, capillary     Status: Abnormal   Collection Time: 01/31/20  6:47 PM  Result Value Ref Range   Glucose-Capillary 115 (H) 70 - 99 mg/dL  Prepare RBC (crossmatch)     Status: None   Collection Time: 01/31/20  7:04 PM  Result Value Ref Range   Order Confirmation      ORDER PROCESSED BY BLOOD BANK Performed at [facility], [address] [place], [place] [address]   SARS Coronavirus 2 by RT PCR (hospital order, performed in [place] hospital lab) Nasopharyngeal Nasopharyngeal Swab     Status: None   Collection Time: 01/31/20  7:11 PM   Specimen: Nasopharyngeal Swab  Result Value Ref Range   SARS Coronavirus 2 NEGATIVE NEGATIVE  Type and screen     Status: None   Collection Time: 01/31/20  8:33 PM  Result Value Ref Range   ABO/RH(D) A POS    Antibody Screen NEG    Sample Expiration 02/03/2020,2359    Unit Number [id number]    Blood Component Type RED CELLS,LR    Unit division 00    Status of Unit  ISSUED,FINAL    Transfusion Status OK TO TRANSFUSE    Crossmatch Result      Compatible Performed at [facility], [address] [place], [place] [address]   Hemoglobin and hematocrit, blood     Status: Abnormal   Collection Time: 01/31/20  8:33 PM  Result Value Ref Range   Hemoglobin 6.5 (L) 12.0 - 15.0 g/dL   HCT 20.0 (L) 36 - 46 %  Basic metabolic panel     Status: Abnormal   Collection Time: 02/01/20  2:24 AM  Result Value Ref Range   Sodium 145 135 - 145 mmol/L   Potassium 4.6 3.5 - 5.1 mmol/L   Chloride 117 (H) 98 - 111 mmol/L   CO2 20 (L) 22 - 32 mmol/L   Glucose, Bld 144 (H) 70 - 99 mg/dL   BUN 72 (H) 8 - 23 mg/dL   Creatinine, Ser 2.50 (H) 0.44 - 1.00 mg/dL   Calcium 9.6 8.9 - 10.3 mg/dL   GFR calc non Af Amer 15 (L) >60 mL/min   GFR calc Af Amer 18 (L) >60 mL/min   Anion gap 8 5 - 15  CBC     Status: Abnormal   Collection Time: 02/01/20  2:24 AM  Result Value Ref Range    WBC 4.8 4.0 - 10.5 K/uL   RBC 2.94 (L) 3.87 - 5.11 MIL/uL   Hemoglobin 9.3 (L) 12.0 - 15.0 g/dL   HCT 28.7 (L) 36 - 46 %   MCV 97.6 80.0 - 100.0 fL   MCH 31.6 26.0 - 34.0 pg   MCHC 32.4 30.0 - 36.0 g/dL   RDW 18.5 (H) 11.5 - 15.5 %   Platelets 199 150 - 400 K/uL   nRBC 0.4 (H) 0.0 - 0.2 %  TSH     Status: Abnormal   Collection Time: 02/01/20  2:24 AM  Result Value Ref Range   TSH 7.720 (H) 0.350 - 4.500 uIU/mL  T4, free     Status: Abnormal   Collection Time: 02/01/20  2:24 AM  Result Value Ref Range   Free T4 1.30 (H) 0.61 - 1.12 ng/dL  Magnesium     Status: None   Collection Time: 02/01/20  2:24 AM  Result Value Ref Range   Magnesium 2.1 1.7 - 2.4 mg/dL  Glucose, capillary     Status: Abnormal   Collection Time: 02/01/20  7:17 AM  Result Value Ref Range   Glucose-Capillary 115 (H) 70 - 99 mg/dL   DG Chest 1 View  Result Date: 01/31/2020 CLINICAL DATA:  Weakness EXAM: CHEST  1 VIEW COMPARISON:  05/26/2018 FINDINGS: Small moderate bilateral pleural effusions. Basilar consolidations. Cardiomegaly with vascular congestion and diffuse interstitial and hazy lung opacity likely edema. Aortic atherosclerosis. No pneumothorax. IMPRESSION: Cardiomegaly with vascular congestion, pulmonary edema, and small to moderate bilateral pleural effusions. Basilar consolidations may reflect atelectasis or pneumonia. Electronically Signed   By: Donavan Foil M.D.   On: 01/31/2020 17:53   CT Head Wo Contrast  Result Date: 01/31/2020 CLINICAL DATA:  Encephalopathy. Additional history provided: Generalized weakness, mainly in both legs. EXAM: CT HEAD WITHOUT CONTRAST TECHNIQUE: Contiguous axial images were obtained from the base of the skull through the vertex without intravenous contrast. COMPARISON:  Head CT 06/12/2018 FINDINGS: Brain: Stable, mild generalized parenchymal atrophy. Redemonstrated chronic lacunar infarct within the posterior limb of left internal capsule. Unchanged mild background  ill-defined hypoattenuation within the cerebral white matter which is nonspecific, but consistent with chronic small vessel ischemic  disease. There is no acute intracranial hemorrhage. No demarcated cortical infarct is identified. No extra-axial fluid collection. No evidence of intracranial mass. No midline shift. Vascular: No hyperdense vessel.  Atherosclerotic calcifications. Skull: Normal. Negative for fracture or focal lesion. Sinuses/Orbits: Visualized orbits show no acute finding. Frothy secretions throughout the left sphenoid sinus. Minimal ethmoid sinus mucosal thickening. Prior postsurgical changes to the right mastoid air cells are questioned. There is a right mastoid effusion. The left mastoid air cells are clear at the imaged levels. IMPRESSION: No evidence of acute intracranial abnormality. Redemonstrated chronic lacunar infarct within the posterior limb of left internal capsule. Stable background mild generalized parenchymal atrophy and chronic small vessel ischemic disease. Paranasal sinus disease as described. Correlate for acute sinusitis. Right mastoid effusion. Electronically Signed   By: Kellie Simmering DO   On: 01/31/2020 17:45   ECHOCARDIOGRAM COMPLETE  Result Date: 02/01/2020    ECHOCARDIOGRAM REPORT   Patient Name:   Tara [name] Date of Exam: 01/31/2020 Medical Rec #:  [id number]      Height:       59.0 in Accession #:    [id number]     Weight:       82.0 lb Date of Birth:  [birth year]       BSA:          1.263 m Patient Age:    43 years       BP:           199/69 mmHg Patient Gender: F              HR:           63 bpm. Exam Location:  [facility] Procedure: 2D Echo, Cardiac Doppler and Color Doppler Indications:     D74.12 Acute Diastolic CHF  History:         Patient has no prior history of Echocardiogram examinations.                  Risk Factors:Hypertension and Diabetes.  Sonographer:     Wilford Sports Rodgers-Jones Referring Phys:  [id number] [place] Diagnosing Phys: Neoma Laming MD IMPRESSIONS   1. Asymetricle septal hypertophy with IHSS. Left ventricular ejection fraction, by estimation, is >75%. The left ventricle has hyperdynamic function. The left ventricle has no regional wall motion abnormalities. There is severe asymmetric left ventricular hypertrophy of the basal-septal segment. Left ventricular diastolic parameters are consistent with Grade I diastolic dysfunction (impaired relaxation).  2. Right ventricular systolic function is normal. The right ventricular size is normal. There is moderately elevated pulmonary artery systolic pressure.  3. Left atrial size was mildly dilated.  4. The mitral valve is normal in structure. Mild mitral valve regurgitation. No evidence of mitral stenosis.  5. The aortic valve is normal in structure. Aortic valve regurgitation is mild. Mild to moderate aortic valve sclerosis/calcification is present, without any evidence of aortic stenosis.  6. The inferior vena cava is normal in size with greater than 50% respiratory variability, suggesting right atrial pressure of 3 mmHg. Conclusion(s)/Recommendation(s): Findings consistent with hypertrophic obstructive cardiomyopathy. Due to the above findings, cardiology consult is recommended. FINDINGS  Left Ventricle: Asymetricle septal hypertophy with IHSS. Left ventricular ejection fraction, by estimation, is >75%. The left ventricle has hyperdynamic function. The left ventricle has no regional wall motion abnormalities. The left ventricular internal cavity size was normal in size. There is severe asymmetric left ventricular hypertrophy of the basal-septal segment. Left ventricular diastolic parameters are consistent with Grade I diastolic  dysfunction (impaired relaxation). Right Ventricle: The right ventricular size is normal. No increase in right ventricular wall thickness. Right ventricular systolic function is normal. There is moderately elevated pulmonary artery systolic pressure. The tricuspid regurgitant velocity is 3.16  m/s, and with an assumed right atrial pressure of 10 mmHg, the estimated right ventricular systolic pressure is 86.7 mmHg. Left Atrium: Left atrial size was mildly dilated. Right Atrium: Right atrial size was normal in size. Pericardium: There is no evidence of pericardial effusion. Mitral Valve: The mitral valve is normal in structure. Normal mobility of the mitral valve leaflets. Mild mitral valve regurgitation. No evidence of mitral valve stenosis. Tricuspid Valve: The tricuspid valve is normal in structure. Tricuspid valve regurgitation is mild . No evidence of tricuspid stenosis. Aortic Valve: The aortic valve is normal in structure. Aortic valve regurgitation is mild. Mild to moderate aortic valve sclerosis/calcification is present, without any evidence of aortic stenosis. Pulmonic Valve: The pulmonic valve was normal in structure. Pulmonic valve regurgitation is mild. No evidence of pulmonic stenosis. Aorta: The aortic root is normal in size and structure. Venous: The inferior vena cava is normal in size with greater than 50% respiratory variability, suggesting right atrial pressure of 3 mmHg. IAS/Shunts: No atrial level shunt detected by color flow Doppler.  LEFT VENTRICLE PLAX 2D LVIDd:         3.14 cm  Diastology LVIDs:         1.68 cm  LV e' lateral:   3.59 cm/s LV PW:         1.57 cm  LV E/e' lateral: 29.2 LV IVS:        1.37 cm  LV e' medial:    5.00 cm/s LVOT diam:     1.80 cm  LV E/e' medial:  21.0 LV SV:         69 LV SV Index:   55 LVOT Area:     2.54 cm  RIGHT VENTRICLE             IVC RV Basal diam:  2.97 cm     IVC diam: 1.64 cm RV S prime:     12.10 cm/s TAPSE (M-mode): 2.4 cm LEFT ATRIUM             Index       RIGHT ATRIUM           Index LA diam:        3.70 cm 2.93 cm/m  RA Area:     11.00 cm LA Vol (A2C):   33.6 ml 26.61 ml/m RA Volume:   22.70 ml  17.98 ml/m LA Vol (A4C):   38.9 ml 30.81 ml/m LA Biplane Vol: 38.7 ml 30.65 ml/m  AORTIC VALVE LVOT Vmax:   87.50 cm/s LVOT Vmean:   72.000 cm/s LVOT VTI:    0.271 m  AORTA Ao Root diam: 2.80 cm MITRAL VALVE                TRICUSPID VALVE MV Area (PHT): 2.34 cm     TR Peak grad:   39.9 mmHg MV Decel Time: 324 msec     TR Vmax:        316.00 cm/s MV E velocity: 105.00 cm/s MV A velocity: 147.00 cm/s  SHUNTS MV E/A ratio:  0.71         Systemic VTI:  0.27 m  Systemic Diam: 1.80 cm Neoma Laming MD Electronically signed by Neoma Laming MD Signature Date/Time: 02/01/2020/9:12:25 AM    Final      ASSESSMENT AND PLAN: HOCM, with echo showing EF over 75% with asymetricle septal hypertophy, causing HEpF, agree with labetolol and hydralzine to control HTN. Due to CRI, IV lasix as needed.  [place] Mielke A

## 2020-02-01 NOTE — Progress Notes (Signed)
20 Called pt daughter [name] to inform of pt move to room 4 ICU.

## 2020-02-01 NOTE — Telephone Encounter (Signed)
Patient currently in ICU.    [facility] TELEPHONE ADVICE RECORD [facility] Patient Name: Tara [name] Gender: Female DOB: 1918-01-22 Age: 86 Y 11 M 18 D Return Phone Number: [phone] (Primary), [phone] (Secondary) Address: City/State/Zip: [place] [address] Client [facility] [place] [facility] - [facility] Client Site [facility] Physician Einar Pheasant - MD Contact Type Call Who Is Calling Patient / Member / Family / Caregiver Call Type Triage / Clinical Caller Name Wynona Luna Relationship To Patient Daughter Return Phone Number [phone] (Primary) Chief Complaint SPEAK - sudden inability to talk or slurred speech Reason for Call Symptomatic / Request for Health Information Initial Comment Call sent from office. Caller states her mom's BP is really high, not eating, really weak and sleeping all the time. Also breathing hard and speech is slurred. Her BP meds are not bringing it down. Current BP 182/83 pulse 71. Translation No Nurse Assessment Nurse: Rock Nephew, RN, Juliann Pulse Date/Time (Eastern Time): 01/31/2020 3:50:17 PM Confirm and document reason for call. If symptomatic, describe symptoms. ---Call sent from office. Caller states her mom's BP is really high ( 182/83). She is not eating, she is really weak and is sleeping all the time. She has exertional dyspnea, intermittent confusion, and slurred speech. Has the patient had close contact with a person known or suspected to have the novel coronavirus illness OR traveled / lives in area with major community spread (including international travel) in the last 14 days from the onset of symptoms? * If Asymptomatic, screen for exposure and travel within the last 14 days. ---No Does the patient have any new or worsening symptoms? ---Yes Will a triage be completed? ---Yes Related visit to physician within the last 2 weeks?  ---No Does the PT have any chronic conditions? (i.e. diabetes, asthma, this includes High risk factors for pregnancy, etc.) ---Yes List chronic conditions. ---HTN, Diabetes Is this a behavioral health or substance abuse call? ---NoPLEASE NOTE: All timestamps contained within this report are represented as [place] Standard Time. CONFIDENTIALTY NOTICE: This fax transmission is intended only for the addressee. It contains information that is legally privileged, confidential or otherwise protected from use or disclosure. If you are not the intended recipient, you are strictly prohibited from reviewing, disclosing, copying using or disseminating any of this information or taking any action in reliance on or regarding this information. If you have received this fax in error, please notify us immediately by telephone so that we can arrange for its return to [place]. Phone: [phone], Toll-Free: [phone], Fax: [phone] Page: 2 of 2 Call Id: [id number] Guidelines Guideline Title Affirmed Question Affirmed Notes Nurse Date/Time Eilene Ghazi Time) Confusion - Delirium [1] Loss of speech or garbled speech AND [2] new-onset Rock Nephew, RN, Juliann Pulse 01/31/2020 3:52:10 PM Disp. Time Eilene Ghazi Time) Disposition Final User 01/31/2020 3:43:16 PM Send to Urgent Queue Kathlynn Grate 01/31/2020 4:00:41 PM 911 Outcome Documentation Rock Nephew, RN, Juliann Pulse Reason: EMS is on the way per caller 01/31/2020 3:54:05 PM Call EMS 911 Now Yes Rock Nephew, RN, Gara Kroner Disagree/Comply Comply Caller Understands Yes PreDisposition Call Doctor Care Advice Given Per Guideline CALL EMS 911 NOW: * Immediate medical attention is needed. You need to hang up and call 911 (or an ambulance). CARE ADVICE given per Confusion-Delirium (Adult) guideline.

## 2020-02-01 NOTE — Consult Note (Addendum)
GI Inpatient Consult Note  Reason for Consult: Heme positive stool   Attending Requesting Consult: Dr. Mal Misty, MD   History of Present Illness: Tara [name] is a 86 y.o. female seen for evaluation of GI bleeding at the request of Dr. Mal Misty. Pt has a PMH of asthma, HTN, T2DM. She presented to the ED for acute onset of generalized weakness, cough, and shortness of breath on exertion. Patient's daughter, [name], is present at bedside and helps with the history. [name] and her sister alternate time with her mother so she has 24-hour support at home. She has multiple other family members who watch after her. Upon presentation to the ED, patient was found to have anemia with hemoglobin 5.9 with macrocytosis, elevated BUN 73 and creatinine of 2.54 (compared to 57/2.31). She had chest x-ray with cardiomegaly with vascular congestion and negative CT head wo contrast. She had positive stool hemoccult in the ED and GI was consulted. She received a total of 1 unit pRBC transfusion and post-transfusion hemoglobin was 9.3. This morning on labs the hemoglobin has increased to 10.7. There have been NO episodes of overt gastrointestinal bleeding in the form of hematochezia, melena, or hematemesis. Daughter reports patient has a granddaughter with history of colon cancer diagnosed at age 54. Patient denies any nausea, vomiting, dysphagia, odynophagia, abdominal pain, diarrhea, or rectal pain. She does deal with intermittent bouts of constipation and had been constipated over the past several days. Daughter reports they use stool softeners as needed at home. Cardiology was consulted and Dr. Humphrey Rolls saw patient who performed echocardiogram showing HOCM and EF >75%. She had hypertensive emergency and was treated with IV nicardipine infusion. Daughter reports she does not take any NSAIDs at home. Daughter reports they are not wanting any invasive evaluation at this time due to patient's age and frailty. Only complaint from  patient is her arthritis pain for which she takes Tylenol at home.    Past Medical History:  Past Medical History:  Diagnosis Date  . Asthma   . Diabetes mellitus ([place])   . Environmental allergies   . Hypertension     Problem List: Patient Active Problem List   Diagnosis Date Noted  . Pressure injury of skin 02/01/2020  . GI bleeding 01/31/2020  . CKD (chronic kidney disease) stage 4, GFR 15-29 ml/min (HCC) 07/09/2019  . Elevated serum creatinine 05/26/2019  . Decreased GFR 05/26/2019  . Cerumen impaction 11/09/2018  . Wound infection after surgery 05/14/2018  . Chronic constipation 04/02/2018  . GERD without esophagitis 04/02/2018  . Abdominal pain 03/02/2018  . Hypertensive urgency, malignant 03/02/2018  . Difficulty breathing 11/03/2017  . Knee effusion, right 11/03/2017  . Fracture of superior rim of left pubis, subsequent encounter for fracture with routine healing 11/03/2017  . History of falling 11/03/2017  . Protein-calorie malnutrition, severe 11/01/2017  . Hypertensive urgency 10/31/2017  . Bilateral lower extremity edema 09/07/2017  . Right hand pain 08/25/2017  . Leg pain 08/25/2017  . Right groin hernia 07/03/2017  . Abnormal CT of the abdomen 07/03/2017  . Allergic rhinitis 06/17/2017  . Osteoporosis 03/12/2017  . Nasal obstruction 09/03/2016  . Loss of weight 03/05/2015  . Lower extremity edema 05/15/2013  . Peripheral vascular disease ([place]) 08/29/2012  . Hypertension 06/11/2012  . Hypercholesteremia 06/11/2012  . Diabetes mellitus with peripheral vascular disease ([place]) 06/11/2012  . Anemia 06/11/2012  . B12 deficiency 06/11/2012  . Hypothyroidism 06/11/2012    Past Surgical History: Past Surgical History:  Procedure Laterality Date  .  APPENDECTOMY    . APPLICATION OF WOUND VAC Right 05/14/2018   Procedure: APPLICATION OF WOUND VAC;  Surgeon: Benjamine Sprague, DO;  Location: [facility] ORS;  Service: General;  Laterality: Right;  . INCISION AND DRAINAGE  ABSCESS Right 05/14/2018   Procedure: INCISION AND DRAINAGE ABSCESS RIGHT GROIN;  Surgeon: Benjamine Sprague, DO;  Location: [facility] ORS;  Service: General;  Laterality: Right;  . INGUINAL HERNIA REPAIR  03/09/2018   Procedure: HERNIA REPAIR INGUINAL INCARCERATED;  Surgeon: Benjamine Sprague, DO;  Location: [facility] ORS;  Service: General;;  . INSERTION OF MESH  03/09/2018   Procedure: INSERTION OF MESH;  Surgeon: Benjamine Sprague, DO;  Location: [facility] ORS;  Service: General;;  . LAPAROSCOPY N/A 03/09/2018   Procedure: LAPAROSCOPY DIAGNOSTIC;  Surgeon: Benjamine Sprague, DO;  Location: [facility] ORS;  Service: General;  Laterality: N/A;  . TUBAL LIGATION      Allergies: Allergies  Allergen Reactions  . Ace Inhibitors Other (See Comments)    Angioedema  . Lisinopril Swelling and Other (See Comments)  . Hydrochlorothiazide W-Triamterene Other (See Comments)    Gout flare  . Penicillins Swelling    Has patient had a PCN reaction causing immediate rash, facial/tongue/throat swelling, SOB or lightheadedness with hypotension: Unknown Has patient had a PCN reaction causing severe rash involving mucus membranes or skin necrosis: Unknown Has patient had a PCN reaction that required hospitalization: Unknown Has patient had a PCN reaction occurring within the last 10 years: No If all of the above answers are "NO", then may proceed with Cephalosporin use.  Other reaction(s): Unknown Has patient had   . Trichlormethiazide Other (See Comments)    Gout flare    Home Medications: Medications Prior to Admission  Medication Sig Dispense Refill Last Dose  . amLODipine (NORVASC) 10 MG tablet TAKE 1 TABLET BY MOUTH EVERY DAY (Patient taking differently: Take 10 mg by mouth daily. ) 90 tablet 1 01/31/2020 at 1000  . aspirin (ASPIRIN 81) 81 MG EC tablet Take 81 mg by mouth daily. Swallow whole.   01/31/2020 at 1000  . Calcium Carbonate-Vitamin D (CALTRATE 600+D PO) Take 1 tablet by mouth daily.    01/31/2020 at 1000  . cloNIDine (CATAPRES)  0.1 MG tablet TAKE 1 TABLET BY MOUTH 3 TIMES DAILY. (Patient taking differently: Take 0.1 mg by mouth 3 (three) times daily. ) 270 tablet 0 01/31/2020 at 1000  . fexofenadine (ALLEGRA) 180 MG tablet TAKE 1 TABLET BY MOUTH EVERY DAY (Patient taking differently: Take 180 mg by mouth daily. TAKE 1 TABLET BY MOUTH EVERY DAY) 90 tablet 1 01/31/2020 at 1000  . hydrALAZINE (APRESOLINE) 100 MG tablet Take 1 tablet (100 mg total) by mouth 3 (three) times daily. 270 tablet 1 01/31/2020 at 1000  . labetalol (NORMODYNE) 100 MG tablet TAKE 1 TABLET BY MOUTH TWICE A DAY (Patient taking differently: Take 100 mg by mouth 2 (two) times daily. ) 180 tablet 1 01/31/2020 at 1000  . SYNTHROID 50 MCG tablet TAKE 1 TABLET BY MOUTH EVERY DAY (Patient taking differently: Take 50 mcg by mouth daily before breakfast. ) 90 tablet 1 01/31/2020 at 1000  . torsemide (DEMADEX) 20 MG tablet Take 20 mg by mouth daily.   01/31/2020 at 1000  . ACCU-CHEK AVIVA PLUS test strip CHECK SUGAR ONCE DAILY 100 strip 6   . Accu-Chek Softclix Lancets lancets Use as instructed to check blood sugars daily. Dx E11.9 100 each 12   . acetaminophen (TYLENOL) 325 MG tablet Take 650 mg by mouth every 6 (six)  hours as needed for mild pain.    unknown at prn  . Insulin Pen Needle (PEN NEEDLES) 32G X 4 MM MISC 1 application by Does not apply route daily. 100 each 1    Home medication reconciliation was completed with the patient.   Scheduled Inpatient Medications:   . amLODipine  10 mg Oral Daily  . aspirin EC  81 mg Oral Daily  . Chlorhexidine Gluconate Cloth  6 each Topical Daily  . cloNIDine  0.1 mg Oral TID  . furosemide  60 mg Intravenous BID  . hydrALAZINE  100 mg Oral TID  . insulin aspart  0-9 Units Subcutaneous TID PC & HS  . labetalol  100 mg Oral BID  . [START ON 02/02/2020] levothyroxine  50 mcg Oral Q0600  . loratadine  10 mg Oral Daily  . [START ON 02/04/2020] pantoprazole  40 mg Intravenous Q12H    Continuous Inpatient Infusions:   .  niCARDipine 5 mg/hr (02/01/20 1139)    PRN Inpatient Medications:  acetaminophen **OR** acetaminophen, labetalol, ondansetron **OR** ondansetron (ZOFRAN) IV, traZODone  Family History: family history includes Cancer in her brother and sister; Hypertension in her mother.  The patient's family history is negative for inflammatory bowel disorders, GI malignancy, or solid organ transplantation.  Social History:   reports that she has never smoked. She has never used smokeless tobacco. She reports that she does not drink alcohol and does not use drugs. The patient denies ETOH, tobacco, or drug use.   Review of Systems: Constitutional: Weight is stable.  Eyes: No changes in vision. ENT: No oral lesions, sore throat.  GI: see HPI.  Heme/Lymph: No easy bruising.  CV: No chest pain.  GU: No hematuria.  Integumentary: No rashes.  Neuro: No headaches.  Psych: No depression/anxiety.  Endocrine: No heat/cold intolerance.  Allergic/Immunologic: No urticaria.  Resp: No cough, SOB.  Musculoskeletal: No joint swelling.    Physical Examination: BP 126/65 (BP Location: Right Arm)   Pulse 65   Temp 97.6 F (36.4 C) (Axillary)   Resp 15   Ht 5' (1.524 m)   Wt 36.4 kg   SpO2 99%   BMI 15.67 kg/m   Elderly female laying in hospital bed. Daughter, [name] present and helps provide all history information.  Gen: NAD, alert and oriented to self and place HEENT: PEERLA, EOMI, Neck: supple, no JVD or thyromegaly Chest: CTAB CV: RRR, no m/g/c/r Abd: soft, NT, ND, +BS in all four quadrants; no HSM, guarding, ridigity, or rebound tenderness Ext: no edema, bilateral leg tenderness to palpation Skin: no rash or lesions noted Lymph: no LAD  Data: Lab Results  Component Value Date   WBC 4.8 02/01/2020   HGB 9.2 (L) 02/01/2020   HCT 27.2 (L) 02/01/2020   MCV 97.6 02/01/2020   PLT 199 02/01/2020   Recent Labs  Lab 02/01/20 0224 02/01/20 0844 02/01/20 1419  HGB 9.3* 10.7* 9.2*   Lab  Results  Component Value Date   NA 145 02/01/2020   K 4.6 02/01/2020   CL 117 (H) 02/01/2020   CO2 20 (L) 02/01/2020   BUN 72 (H) 02/01/2020   CREATININE 2.50 (H) 02/01/2020   Lab Results  Component Value Date   ALT 11 01/31/2020   AST 24 01/31/2020   ALKPHOS 39 01/31/2020   BILITOT 0.8 01/31/2020   No results for input(s): APTT, INR, PTT in the last 168 hours. Assessment/Plan:  86 y/o AA female with a PMH of asthma, HTN, T2DM, hypothyroidism, and  Stage IV CKD admitted for hypertensive emergency  1. Heme positive stool  2. Anemia - macrocytic (MCV 103.3)  3. Chronic constipation   4. Hypertensive emergency - hospitalist following, cardiology following  5. CKD Stage IV   -Patient had hemoglobin of 5.9 on admission and heme positive stool and was called a GI bleed. She has had no overt gastrointestinal blood loss. She was transfused 1 unit pRBCs and hemoglobin has increased to 10.7 this morning. -Anemia is likely 2/2 anemia of chronic disease given history of CKD Stage IV versus nutritional -Consider heme positive stool from possible hemorrhoid, polyp, AVM, or occult GI malignancy.  -No evidence of GI bleeding. Discussed with patient and daughter that invasive evaluation is not recommended at this time given her improvements in hemoglobin and advanced age of 36.  -Hemoglobin is stable with no signs of precipitous drop and no overt GIB -Family is against any invasive procedures at this time -We discussed regular stool softeners/laxatives to prevent constipation -GI will sign off at this time.    Thank you for the consult. Please call with questions or concerns.  [facility] [phone] (Cell)  Attending Physician Attestation:  I have obtained an interval history from the patient, reviewed the chart and performed a physical examination. The Advanced Practitioner's note, clinical impression and recommendations  have been formulated with my input and I agree with the assessment and plan as outlined above.  Thank you for allowing [place] to be involved in this patient's care.  Please call me if you have any questions.   Robet Leu, M.D. [facility]  [phone] - Office [phone]- Cell phone

## 2020-02-01 NOTE — Progress Notes (Signed)
DAUGHTER AT BEDSIDE

## 2020-02-01 NOTE — Progress Notes (Signed)
Initial Nutrition Assessment  DOCUMENTATION CODES:   Severe malnutrition in context of chronic illness, Underweight  INTERVENTION:  Once diet is advanced provide Ensure Enlive po TID, each supplement provides 350 kcal and 20 grams of protein.  Pt with poor oral intake and would benefit from nutrient dense supplement. One Ensure Enlive supplement provides 350 kcals, 20 grams protein, and 44-45 grams of carbohydrate vs one Glucerna shake supplement, which provides 220 kcals, 10 grams of protein, and 26 grams of carbohydrate. Given pt's hx of DM, RD will reassess adequacy of PO intake, CBGS, and adjust supplement regimen as appropriate at follow-up.   Provide MVI daily with diet advancement.  Monitor magnesium, potassium, and phosphorus daily for at least 3 days, MD to replete as needed, as pt is at risk for refeeding syndrome given severe malnutrition.  NUTRITION DIAGNOSIS:   Severe Malnutrition related to chronic illness (CKD, advanced age, inadequate oral intake) as evidenced by severe fat depletion, severe muscle depletion.  GOAL:   Patient will meet greater than or equal to 90% of their needs  MONITOR:   Diet advancement, PO intake, Supplement acceptance, Labs, Weight trends, I & O's  REASON FOR ASSESSMENT:   Malnutrition Screening Tool    ASSESSMENT:   86 year old female with PMHx of asthma, DM, HTN, hypothyroidism admitted with GI bleed with acute blood loss anemia on top of anemia of chronic disease, hypertensive urgency, HFpEF, AKI on CKD stage III.   Met with patient at bedside. Patient is known to this RD from previous admissions. Also met with patient's daughter when she arrived to room. Patient reports her appetite remains decreased. Daughter reports patient lives at home. She is offered 3 meals per day but eats very small amounts at meals. They also have Ensure at home for her to drink. Patient is currently NPO. She is amenable to drinking Ensure once diet is able to be  advanced.   Patient's UBW had been around 90 lbs (40.9 kg). She had lost down to 35.4 kg on 05/14/2018. She slowly gained weight up to 39.1 kg on 11/08/2018. Weight has decreased slowly decreased again. She is now 36.4 kg (80.25 lbs). This is not a significant weight loss for time frame.  Medications reviewed and include: Lasix 60 mg BID IV, Novolog 0-9 units TID and QHS, Protonix, Cardene, Protonix.  Labs reviewed: CBG 115, Chloride 117, CO2 20, BUN 72, Creatinine 2.5.  Discussed with RN.  NUTRITION - FOCUSED PHYSICAL EXAM:    Most Recent Value  Orbital Region Severe depletion  Upper Arm Region Severe depletion  Thoracic and Lumbar Region Severe depletion  Buccal Region Severe depletion  Temple Region Severe depletion  Clavicle Bone Region Severe depletion  Clavicle and Acromion Bone Region Severe depletion  Scapular Bone Region Severe depletion  Dorsal Hand Severe depletion  Patellar Region Severe depletion  Anterior Thigh Region Severe depletion  Posterior Calf Region Severe depletion  Edema (RD Assessment) None  Hair Reviewed  Eyes Reviewed  Mouth Reviewed  Skin Reviewed  Nails Reviewed     Diet Order:   Diet Order            Diet NPO time specified Except for: Sips with Meds  Diet effective now                EDUCATION NEEDS:   No education needs have been identified at this time  Skin:  Skin Assessment: Skin Integrity Issues: Skin Integrity Issues:: Stage II Stage II: sacrum  Last  BM:  01/31/2020  Height:   Ht Readings from Last 1 Encounters:  02/01/20 5' (1.524 m)   Weight:   Wt Readings from Last 1 Encounters:  02/01/20 36.4 kg   Ideal Body Weight:  45.5 kg  BMI:  Body mass index is 15.67 kg/m.  Estimated Nutritional Needs:   Kcal:  1200-1400  Protein:  60-70 grams  Fluid:  1.2-1.4 L/day  Jacklynn Barnacle, MS, RD, LDN Pager number available on Amion

## 2020-02-01 NOTE — Progress Notes (Addendum)
Colon visited pt. while rounding on ICU; pt. awake, lying in bed with blankets up to her chin.  Dtr. [name] @ bedside.  Pt. hard of hearing but alert and able to respond to questions.  Dtr. said pt. was admitted to [facility] for a change in her blood pressure; pt. lives in her own home in [place]; her two dtrs. take turns staying with her so that she has 24hr support at home; pt. also has two sons who live locally.  Dtr. requested prayer for pt. and CH offered prayer w/dtr. and pt. at bedside.  CH remarked that pt. seems to possess a remarkable degree of grace and joy at her advanced age; dtr. said pt.'s kindness has increased with age; shared that her father who died of colon cancer some years ago also exhibited remarkable peace and kindness in the midst of his illness.  Dtr. lost her own dtr. this past March to colon cancer: 'I still miss her... we were very close' dtr. shared.  Dtr. described pt.'s extended family of grandchildren, great grandchildren, and great-great grandchildren; pt. appears to be very well-supported by family, and was resting peacefully when [facility] left rm.  CH remains available as needed.    02/01/20 1140  Clinical Encounter Type  Visited With Patient and family together  Visit Type Initial;Psychological support;Spiritual support;Social support;Critical Care  Referral From Other (Comment) (Routine Rounding)  Spiritual Encounters  Spiritual Needs Emotional;Prayer  Stress Factors  Patient Stress Factors Health changes  Family Stress Factors Loss;Health changes;Major life changes

## 2020-02-02 DIAGNOSIS — N184 Chronic kidney disease, stage 4 (severe): Secondary | ICD-10-CM

## 2020-02-02 DIAGNOSIS — E1151 Type 2 diabetes mellitus with diabetic peripheral angiopathy without gangrene: Secondary | ICD-10-CM

## 2020-02-02 LAB — GLUCOSE, CAPILLARY
Glucose-Capillary: 87 mg/dL (ref 70–99)
Glucose-Capillary: 115 mg/dL — ABNORMAL HIGH (ref 70–99)
Glucose-Capillary: 197 mg/dL — ABNORMAL HIGH (ref 70–99)
Glucose-Capillary: 168 mg/dL — ABNORMAL HIGH (ref 70–99)

## 2020-02-02 LAB — CBC WITH DIFFERENTIAL/PLATELET
Abs Immature Granulocytes: 0.02 10*3/uL (ref 0.00–0.07)
Basophils Absolute: 0 10*3/uL (ref 0.0–0.1)
Basophils Relative: 0 %
Eosinophils Absolute: 0 10*3/uL (ref 0.0–0.5)
Eosinophils Relative: 0 %
HCT: 24.6 % — ABNORMAL LOW (ref 36.0–46.0)
Hemoglobin: 8.4 g/dL — ABNORMAL LOW (ref 12.0–15.0)
Immature Granulocytes: 0 %
Lymphocytes Relative: 8 %
Lymphs Abs: 0.4 10*3/uL — ABNORMAL LOW (ref 0.7–4.0)
MCH: 31.7 pg (ref 26.0–34.0)
MCHC: 34.1 g/dL (ref 30.0–36.0)
MCV: 92.8 fL (ref 80.0–100.0)
Monocytes Absolute: 0.3 10*3/uL (ref 0.1–1.0)
Monocytes Relative: 6 %
Neutro Abs: 4 10*3/uL (ref 1.7–7.7)
Neutrophils Relative %: 86 %
Platelets: 176 10*3/uL (ref 150–400)
RBC: 2.65 MIL/uL — ABNORMAL LOW (ref 3.87–5.11)
RDW: 18.4 % — ABNORMAL HIGH (ref 11.5–15.5)
WBC: 4.7 10*3/uL (ref 4.0–10.5)
nRBC: 0 % (ref 0.0–0.2)

## 2020-02-02 LAB — BASIC METABOLIC PANEL
Anion gap: 8 (ref 5–15)
BUN: 86 mg/dL — ABNORMAL HIGH (ref 8–23)
CO2: 21 mmol/L — ABNORMAL LOW (ref 22–32)
Calcium: 8.6 mg/dL — ABNORMAL LOW (ref 8.9–10.3)
Chloride: 114 mmol/L — ABNORMAL HIGH (ref 98–111)
Creatinine, Ser: 2.85 mg/dL — ABNORMAL HIGH (ref 0.44–1.00)
GFR calc Af Amer: 15 mL/min — ABNORMAL LOW (ref >60)
GFR calc non Af Amer: 13 mL/min — ABNORMAL LOW (ref >60)
Glucose, Bld: 107 mg/dL — ABNORMAL HIGH (ref 70–99)
Potassium: 4.8 mmol/L (ref 3.5–5.1)
Sodium: 143 mmol/L (ref 135–145)

## 2020-02-02 MED ORDER — ADULT MULTIVITAMIN W/MINERALS CH
1.0000 | ORAL_TABLET | Freq: Every day | ORAL | Status: DC
  Administered 2020-02-02 – 2020-02-09 (×8): 1 via ORAL
  Filled 2020-02-02 (×9): qty 1

## 2020-02-02 MED ORDER — LABETALOL HCL 200 MG PO TABS
200.0000 mg | ORAL_TABLET | Freq: Two times a day (BID) | ORAL | Status: DC
  Filled 2020-02-02: qty 1

## 2020-02-02 MED ORDER — ENSURE ENLIVE PO LIQD
237.0000 mL | Freq: Three times a day (TID) | ORAL | Status: DC
  Administered 2020-02-02 – 2020-02-09 (×17): 237 mL via ORAL

## 2020-02-02 MED ORDER — LACTATED RINGERS IV SOLN: INTRAVENOUS | Status: AC

## 2020-02-02 MED ORDER — DOXAZOSIN MESYLATE 1 MG PO TABS
1.0000 mg | ORAL_TABLET | Freq: Every day | ORAL | Status: DC
  Administered 2020-02-02 – 2020-02-09 (×8): 1 mg via ORAL
  Filled 2020-02-02 (×8): qty 1

## 2020-02-02 MED ORDER — LABETALOL HCL 100 MG PO TABS
100.0000 mg | ORAL_TABLET | Freq: Two times a day (BID) | ORAL | Status: DC
  Administered 2020-02-02 – 2020-02-09 (×15): 100 mg via ORAL
  Filled 2020-02-02 (×15): qty 1

## 2020-02-02 NOTE — Progress Notes (Addendum)
Progress Note    Tara [name]  DJM:[id number] DOB: 1917-09-29  DOA: 01/31/2020 PCP: Einar Pheasant, MD      Brief Narrative:    Medical records reviewed and are as summarized below:  Tara [name] is a 86 y.o. female  with a known history of asthma, hypertension and type 2 diabetes mellitus, who presented to the emergency room with acute onset of generalized weakness, cough, exertional shortness of breath.      Assessment/Plan:   Active Problems:   Diabetes mellitus with peripheral vascular disease (HCC)   CKD (chronic kidney disease) stage 4, GFR 15-29 ml/min (HCC)   GI bleeding   Pressure injury of skin    Positive heme stools with severe anemia in a patient with history of anemia of chronic disease S/p transfusion with 1 unit of packed red blood cells.  H&H is stable.  Patient was evaluated by gastroenterologist and conservative management was recommended.  Patient's daughter does not want any invasive procedures.  Continue Protonix  Hypertensive emergency She is off of IV Cardene drip.   Continue antihypertensives.  Doxazosin has been added for adequate BP control.  Acute new onset likely diastolic CHF with pulmonary edema.  This could be secondary to hypertensive urgency and high-output failure. Continue IV Lasix.  Monitor daily weights, BMP and urine output.  Patient has been evaluated by cardiologist.  2D echo showed EF estimated >22%, grade 1 diastolic dysfunction, moderately elevated pulmonary artery systolic pressure.  AKI on Stage IV CKD -Creatinine is trending up.  IV Lasix has been discontinued.  Low rate IV fluids has been ordered.  Monitor BMP.  Hypothyroidism. Continue Synthroid  Type 2 diabetes mellitus. Follow-up as needed for hyperglycemia  History of stroke/chronic lacunar infarcts Hold aspirin for now.  Sinus bradycardia Asymptomatic.  Heart rate had gone into the low 50s while she was sleeping.  Generalized weakness PT and OT  evaluation.  Stage II sacral decubitus ulcer  Present on admission.  Turn patient every 2 hours.  Continue local wound care.  Body mass index is 15.67 kg/m.  Diet Order            DIET DYS 2 Room service appropriate? Yes; Fluid consistency: Thin  Diet effective now                       Medications:   . amLODipine  10 mg Oral Daily  . Chlorhexidine Gluconate Cloth  6 each Topical Daily  . cloNIDine  0.1 mg Oral TID  . doxazosin  1 mg Oral Daily  . feeding supplement (ENSURE ENLIVE)  237 mL Oral TID BM  . hydrALAZINE  100 mg Oral TID  . insulin aspart  0-9 Units Subcutaneous TID PC & HS  . labetalol  100 mg Oral BID  . levothyroxine  50 mcg Oral Q0600  . loratadine  10 mg Oral Daily  . multivitamin with minerals  1 tablet Oral Daily  . [START ON 02/04/2020] pantoprazole  40 mg Intravenous Q12H   Continuous Infusions: . lactated ringers 50 mL/hr at 02/02/20 1033  . niCARDipine Stopped (02/01/20 1614)     Anti-infectives (From admission, onward)   None             Family Communication/Anticipated D/C date and plan/Code Status   DVT prophylaxis: SCDs Start: 01/31/20 1948     Code Status: DNR this was discussed with patient's daughter, [name] who confirmed that she wants patient to be  DNR.  Family Communication: Plan discussed with her daughter at the bedside Disposition Plan:    Status is: Inpatient  Remains inpatient appropriate because:IV treatments appropriate due to intensity of illness or inability to take PO and Inpatient level of care appropriate due to severity of illness   Dispo: The patient is from: Home              Anticipated d/c is to: Home              Anticipated d/c date is: 2 days              Patient currently is not medically stable to d/c.           Subjective:   She complains of generalized weakness.  No shortness of breath or chest pain.  No rectal bleeding.  Objective:    Vitals:   02/02/20 0200  02/02/20 0300 02/02/20 0400 02/02/20 0800  BP: (!) 178/81 (!) 182/76 (!) 146/56 (!) 156/62  Pulse: (!) 58 66 61   Resp: 17 20 12    Temp:    98.1 F (36.7 C)  TempSrc:    Axillary  SpO2: 100% 99% 99%   Weight:      Height:       No data found.   Intake/Output Summary (Last 24 hours) at 02/02/2020 1502 Last data filed at 02/02/2020 0600 Gross per 24 hour  Intake 623.45 ml  Output 700 ml  Net -76.55 ml   Filed Weights   02/01/20 0026 02/01/20 0359 02/01/20 0645  Weight: 37 kg 36.6 kg 36.4 kg    Exam:  GEN: NAD SKIN: Stage II sacral decubitus ulcer EYES: EOMI ENT: MMM CV: Regular rate and rhythm PULM: No wheezing or rales. ABD: soft, ND, NT, +BS CNS: AAO x 3, non focal EXT: No edema. B/l leg tenderness (increased sensitivity to touch)   Data Reviewed:   I have personally reviewed following labs and imaging studies:  Labs: Labs show the following:   Basic Metabolic Panel: Recent Labs  Lab 01/31/20 1738 01/31/20 1738 02/01/20 0224 02/02/20 0411  NA 144  --  145 143  K 4.7   < > 4.6 4.8  CL 117*  --  117* 114*  CO2 21*  --  20* 21*  GLUCOSE 140*  --  144* 107*  BUN 73*  --  72* 86*  CREATININE 2.54*  --  2.50* 2.85*  CALCIUM 9.6  --  9.6 8.6*  MG  --   --  2.1  --    < > = values in this interval not displayed.   GFR Estimated Creatinine Clearance: 6.2 mL/min (A) (by C-G formula based on SCr of 2.85 mg/dL (H)). Liver Function Tests: Recent Labs  Lab 01/31/20 1738  AST 24  ALT 11  ALKPHOS 39  BILITOT 0.8  PROT 6.7  ALBUMIN 3.1*   No results for input(s): LIPASE, AMYLASE in the last 168 hours. No results for input(s): AMMONIA in the last 168 hours. Coagulation profile No results for input(s): INR, PROTIME in the last 168 hours.  CBC: Recent Labs  Lab 01/31/20 1738 01/31/20 1738 01/31/20 2033 02/01/20 0224 02/01/20 0844 02/01/20 1419 02/02/20 0411  WBC 4.8  --   --  4.8  --   --  4.7  NEUTROABS 3.1  --   --   --   --   --  4.0  HGB  5.9*   < > 6.5* 9.3* 10.7* 9.2* 8.4*  HCT 18.6*   < > 20.0* 28.7* 31.7* 27.2* 24.6*  MCV 103.3*  --   --  97.6  --   --  92.8  PLT 188  --   --  199  --   --  176   < > = values in this interval not displayed.   Cardiac Enzymes: No results for input(s): CKTOTAL, CKMB, CKMBINDEX, TROPONINI in the last 168 hours. BNP (last 3 results) No results for input(s): PROBNP in the last 8760 hours. CBG: Recent Labs  Lab 02/01/20 1126 02/01/20 1630 02/01/20 2043 02/02/20 0733 02/02/20 1114  GLUCAP 155* 149* 196* 87 115*   D-Dimer: No results for input(s): DDIMER in the last 72 hours. Hgb A1c: No results for input(s): HGBA1C in the last 72 hours. Lipid Profile: No results for input(s): CHOL, HDL, LDLCALC, TRIG, CHOLHDL, LDLDIRECT in the last 72 hours. Thyroid function studies: Recent Labs    02/01/20 0224  TSH 7.720*   Anemia work up: No results for input(s): VITAMINB12, FOLATE, FERRITIN, TIBC, IRON, RETICCTPCT in the last 72 hours. Sepsis Labs: Recent Labs  Lab 01/31/20 1738 02/01/20 0224 02/02/20 0411  WBC 4.8 4.8 4.7    Microbiology Recent Results (from the past 240 hour(s))  SARS Coronavirus 2 by RT PCR (hospital order, performed in [facility] hospital lab) Nasopharyngeal Nasopharyngeal Swab     Status: None   Collection Time: 01/31/20  7:11 PM   Specimen: Nasopharyngeal Swab  Result Value Ref Range Status   SARS Coronavirus 2 NEGATIVE NEGATIVE Final    Comment: (NOTE) SARS-CoV-2 target nucleic acids are NOT DETECTED.  The SARS-CoV-2 RNA is generally detectable in upper and lower respiratory specimens during the acute phase of infection. The lowest concentration of SARS-CoV-2 viral copies this assay can detect is 250 copies / mL. A negative result does not preclude SARS-CoV-2 infection and should not be used as the sole basis for treatment or other patient management decisions.  A negative result may occur with improper specimen collection / handling, submission of  specimen other than nasopharyngeal swab, presence of viral mutation(s) within the areas targeted by this assay, and inadequate number of viral copies (<250 copies / mL). A negative result must be combined with clinical observations, patient history, and epidemiological information.  Fact Sheet for Patients:   StrictlyIdeas.no  Fact Sheet for Healthcare Providers: BankingDealers.co.za  This test is not yet approved or  cleared by the [place] FDA and has been authorized for detection and/or diagnosis of SARS-CoV-2 by FDA under an Emergency Use Authorization (EUA).  This EUA will remain in effect (meaning this test can be used) for the duration of the COVID-19 declaration under Section 564(b)(1) of the Act, 21 U.S.C. section 360bbb-3(b)(1), unless the authorization is terminated or revoked sooner.  Performed at [facility], [address] [place], [place] [address]     Procedures and diagnostic studies:  DG Chest 1 View  Result Date: 01/31/2020 CLINICAL DATA:  Weakness EXAM: CHEST  1 VIEW COMPARISON:  05/26/2018 FINDINGS: Small moderate bilateral pleural effusions. Basilar consolidations. Cardiomegaly with vascular congestion and diffuse interstitial and hazy lung opacity likely edema. Aortic atherosclerosis. No pneumothorax. IMPRESSION: Cardiomegaly with vascular congestion, pulmonary edema, and small to moderate bilateral pleural effusions. Basilar consolidations may reflect atelectasis or pneumonia. Electronically Signed   By: Donavan Foil M.D.   On: 01/31/2020 17:53   CT Head Wo Contrast  Result Date: 01/31/2020 CLINICAL DATA:  Encephalopathy. Additional history provided: Generalized weakness, mainly in both legs.  EXAM: CT HEAD WITHOUT CONTRAST TECHNIQUE: Contiguous axial images were obtained from the base of the skull through the vertex without intravenous contrast. COMPARISON:  Head CT 06/12/2018 FINDINGS: Brain:  Stable, mild generalized parenchymal atrophy. Redemonstrated chronic lacunar infarct within the posterior limb of left internal capsule. Unchanged mild background ill-defined hypoattenuation within the cerebral white matter which is nonspecific, but consistent with chronic small vessel ischemic disease. There is no acute intracranial hemorrhage. No demarcated cortical infarct is identified. No extra-axial fluid collection. No evidence of intracranial mass. No midline shift. Vascular: No hyperdense vessel.  Atherosclerotic calcifications. Skull: Normal. Negative for fracture or focal lesion. Sinuses/Orbits: Visualized orbits show no acute finding. Frothy secretions throughout the left sphenoid sinus. Minimal ethmoid sinus mucosal thickening. Prior postsurgical changes to the right mastoid air cells are questioned. There is a right mastoid effusion. The left mastoid air cells are clear at the imaged levels. IMPRESSION: No evidence of acute intracranial abnormality. Redemonstrated chronic lacunar infarct within the posterior limb of left internal capsule. Stable background mild generalized parenchymal atrophy and chronic small vessel ischemic disease. Paranasal sinus disease as described. Correlate for acute sinusitis. Right mastoid effusion. Electronically Signed   By: Kellie Simmering DO   On: 01/31/2020 17:45   ECHOCARDIOGRAM COMPLETE  Result Date: 02/01/2020    ECHOCARDIOGRAM REPORT   Patient Name:   ROZETTA STUMPP Date of Exam: 01/31/2020 Medical Rec #:  [id number]      Height:       59.0 in Accession #:    [id number]     Weight:       82.0 lb Date of Birth:  1918/05/20       BSA:          1.263 m Patient Age:    78 years       BP:           199/69 mmHg Patient Gender: F              HR:           63 bpm. Exam Location:  [facility] Procedure: 2D Echo, Cardiac Doppler and Color Doppler Indications:     P10.25 Acute Diastolic CHF  History:         Patient has no prior history of Echocardiogram examinations.                   Risk Factors:Hypertension and Diabetes.  Sonographer:     Wilford Sports Rodgers-Jones Referring Phys:  [id number] [place] Diagnosing Phys: Neoma Laming MD IMPRESSIONS  1. Asymetricle septal hypertophy with IHSS. Left ventricular ejection fraction, by estimation, is >75%. The left ventricle has hyperdynamic function. The left ventricle has no regional wall motion abnormalities. There is severe asymmetric left ventricular hypertrophy of the basal-septal segment. Left ventricular diastolic parameters are consistent with Grade I diastolic dysfunction (impaired relaxation).  2. Right ventricular systolic function is normal. The right ventricular size is normal. There is moderately elevated pulmonary artery systolic pressure.  3. Left atrial size was mildly dilated.  4. The mitral valve is normal in structure. Mild mitral valve regurgitation. No evidence of mitral stenosis.  5. The aortic valve is normal in structure. Aortic valve regurgitation is mild. Mild to moderate aortic valve sclerosis/calcification is present, without any evidence of aortic stenosis.  6. The inferior vena cava is normal in size with greater than 50% respiratory variability, suggesting right atrial pressure of 3 mmHg. Conclusion(s)/Recommendation(s): Findings consistent with hypertrophic obstructive cardiomyopathy. Due to the above  findings, cardiology consult is recommended. FINDINGS  Left Ventricle: Asymetricle septal hypertophy with IHSS. Left ventricular ejection fraction, by estimation, is >75%. The left ventricle has hyperdynamic function. The left ventricle has no regional wall motion abnormalities. The left ventricular internal cavity size was normal in size. There is severe asymmetric left ventricular hypertrophy of the basal-septal segment. Left ventricular diastolic parameters are consistent with Grade I diastolic dysfunction (impaired relaxation). Right Ventricle: The right ventricular size is normal. No increase in right ventricular wall  thickness. Right ventricular systolic function is normal. There is moderately elevated pulmonary artery systolic pressure. The tricuspid regurgitant velocity is 3.16 m/s, and with an assumed right atrial pressure of 10 mmHg, the estimated right ventricular systolic pressure is 57.3 mmHg. Left Atrium: Left atrial size was mildly dilated. Right Atrium: Right atrial size was normal in size. Pericardium: There is no evidence of pericardial effusion. Mitral Valve: The mitral valve is normal in structure. Normal mobility of the mitral valve leaflets. Mild mitral valve regurgitation. No evidence of mitral valve stenosis. Tricuspid Valve: The tricuspid valve is normal in structure. Tricuspid valve regurgitation is mild . No evidence of tricuspid stenosis. Aortic Valve: The aortic valve is normal in structure. Aortic valve regurgitation is mild. Mild to moderate aortic valve sclerosis/calcification is present, without any evidence of aortic stenosis. Pulmonic Valve: The pulmonic valve was normal in structure. Pulmonic valve regurgitation is mild. No evidence of pulmonic stenosis. Aorta: The aortic root is normal in size and structure. Venous: The inferior vena cava is normal in size with greater than 50% respiratory variability, suggesting right atrial pressure of 3 mmHg. IAS/Shunts: No atrial level shunt detected by color flow Doppler.  LEFT VENTRICLE PLAX 2D LVIDd:         3.14 cm  Diastology LVIDs:         1.68 cm  LV e' lateral:   3.59 cm/s LV PW:         1.57 cm  LV E/e' lateral: 29.2 LV IVS:        1.37 cm  LV e' medial:    5.00 cm/s LVOT diam:     1.80 cm  LV E/e' medial:  21.0 LV SV:         69 LV SV Index:   55 LVOT Area:     2.54 cm  RIGHT VENTRICLE             IVC RV Basal diam:  2.97 cm     IVC diam: 1.64 cm RV S prime:     12.10 cm/s TAPSE (M-mode): 2.4 cm LEFT ATRIUM             Index       RIGHT ATRIUM           Index LA diam:        3.70 cm 2.93 cm/m  RA Area:     11.00 cm LA Vol (A2C):   33.6 ml 26.61  ml/m RA Volume:   22.70 ml  17.98 ml/m LA Vol (A4C):   38.9 ml 30.81 ml/m LA Biplane Vol: 38.7 ml 30.65 ml/m  AORTIC VALVE LVOT Vmax:   87.50 cm/s LVOT Vmean:  72.000 cm/s LVOT VTI:    0.271 m  AORTA Ao Root diam: 2.80 cm MITRAL VALVE                TRICUSPID VALVE MV Area (PHT): 2.34 cm     TR Peak grad:   39.9 mmHg MV Decel Time:  324 msec     TR Vmax:        316.00 cm/s MV E velocity: 105.00 cm/s MV A velocity: 147.00 cm/s  SHUNTS MV E/A ratio:  0.71         Systemic VTI:  0.27 m                             Systemic Diam: 1.80 cm Neoma Laming MD Electronically signed by Neoma Laming MD Signature Date/Time: 02/01/2020/9:12:25 AM    Final                LOS: 2 days   Karel Mowers  [facility]     02/02/2020, 3:02 PM

## 2020-02-02 NOTE — Evaluation (Addendum)
Occupational Therapy Evaluation Patient Details Name: Tara [name] MRN: [id number] DOB: 03-15-1918 Today's Date: 02/02/2020    History of Present Illness Pt is a 86 yo female that presented to ED for hyptensive emergency, also heme positive stool without indication of GI bleed, has recieved a transfusion this hospital stay. PMH of asthma, HTN, DMII, hypothyroidsm, stage IV CKD.   Clinical Impression   Tara [name] presents sitting up in recliner, pleasant and alert. She repeatedly requests to get back in the bed, and completes sit to stand transfer and small shuffling steps to pivot to EOB with Mod A. Pt requires support for balance in both sitting and standing. Based on pt functional performance, anticipate Setup A-Min A with increased cues required for UB ADL in supported sitting; Mod-Max A for seated LB ADL in supported sitting. She is HOH and her speech is mildly garbled, but she responds to commands when given increased processing time and consistent verbal/visual cues. Pt demonstrates some difficulties with following neuro screen instructions (complexity of request vs HOH uncertain); graded for optimal pt response with equal performance bilaterally and no significant concerns noted. Per RN, PT and chart review, pt lives at home and has strong support network with daughters and family providing 24/7 care for pt without difficulty. Given this support network, family confidence with caregiving, and pt current functional presentation, no further OT needs recommended at this time.     Follow Up Recommendations  No OT follow up    Equipment Recommendations  None recommended by OT    Recommendations for Other Services       Precautions / Restrictions Precautions Precautions: Fall Restrictions Weight Bearing Restrictions: No      Mobility Bed Mobility Overal bed mobility: Needs Assistance Bed Mobility: Sit to Supine   Sit to supine: Mod assist   General bed mobility comments: per family  pt needs assistance with bed mobility at baseline  Transfers Overall transfer level: Needs assistance Transfers: Sit to/from Stand Sit to Stand: Mod assist         General transfer comment: Mod A to stand up from the recliner and maintain balance, took small lateral shuffling steps to pivot to sit at EOB    Balance Overall balance assessment: Needs assistance Sitting-balance support: Feet unsupported;Bilateral upper extremity supported Sitting balance-Leahy Scale: Poor Sitting balance - Comments: requires support to maintain static sitting balance   Standing balance support: Bilateral upper extremity supported Standing balance-Leahy Scale: Poor Standing balance comment: requires support to achieve and maintain static standing balance                           ADL either performed or assessed with clinical judgement   ADL Overall ADL's : Needs assistance/impaired                                     Functional mobility during ADLs: Rolling walker;Moderate assistance General ADL Comments: Per chart review and pt report, pt requires assist for ADL at baseline. Family not present to verify. Based on pt functional performance, anticipate Setup A-Min A with increased cues required for UB ADL in supported sitting; Mod-Max A for seated LB ADL in supported sitting.     Vision         Perception     Praxis      Pertinent Vitals/Pain Pain Assessment: Faces Faces Pain Scale: Hurts  a little bit Pain Location: R leg Pain Descriptors / Indicators: Grimacing Pain Intervention(s): Repositioned     Hand Dominance Right   Extremity/Trunk Assessment Upper Extremity Assessment Upper Extremity Assessment: Generalized weakness   Lower Extremity Assessment Lower Extremity Assessment: Generalized weakness;RLE deficits/detail RLE Deficits / Details: RLE weaker than LLE, unable to fully lift off of bed   Cervical / Trunk Assessment Cervical / Trunk  Assessment: Kyphotic   Communication Communication Communication: HOH   Cognition Arousal/Alertness: Awake/alert Behavior During Therapy: WFL for tasks assessed/performed Overall Cognitive Status: Within Functional Limits for tasks assessed                                 General Comments: pt is alert and oriented to self, place and situation, able to follow commands with increased processing time and cues; per family report in chart from earlier this date, pt is not at baseline, family not present to verify   General Comments       Exercises Other Exercises: Provided verbal and visual cues throughout session to facilitate optimal pt engagement   Shoulder Instructions      Home Living Family/patient expects to be discharged to:: Private residence Living Arrangements: Children Available Help at Discharge: Family;Personal care attendant;Available 24 hours/day Type of Home: House Home Access: Ramped entrance     Home Layout: One level     Bathroom Shower/Tub: Tub/shower unit         Home Equipment: Environmental consultant - 2 wheels;Walker - 4 wheels;Shower seat   Additional Comments: no falls in the last 6 months      Prior Functioning/Environment Level of Independence: Needs assistance  Gait / Transfers Assistance Needed: per PT, pt ambulates with RW in house with supervision at baseline ADL's / Homemaking Assistance Needed: pt says her daughters assist with bathing and dressing; per PT, daughters assist with meals, errands            OT Problem List: Decreased strength;Decreased range of motion;Impaired balance (sitting and/or standing);Decreased knowledge of use of DME or AE;Decreased activity tolerance;Decreased cognition;Cardiopulmonary status limiting activity      OT Treatment/Interventions:      OT Goals(Current goals can be found in the care plan section) Acute Rehab OT Goals Patient Stated Goal: To get back in the bed OT Goal Formulation: All assessment  and education complete, DC therapy  OT Frequency:     Barriers to D/C:            Co-evaluation              AM-PAC OT "6 Clicks" Daily Activity     Outcome Measure Help from another person eating meals?: A Little Help from another person taking care of personal grooming?: A Little Help from another person toileting, which includes using toliet, bedpan, or urinal?: A Lot Help from another person bathing (including washing, rinsing, drying)?: A Lot Help from another person to put on and taking off regular upper body clothing?: A Lot Help from another person to put on and taking off regular lower body clothing?: A Lot 6 Click Score: 14   End of Session Equipment Utilized During Treatment: Gait belt;Rolling walker Nurse Communication: Mobility status  Activity Tolerance: Patient tolerated treatment well Patient left: in bed;with call bell/phone within reach;with bed alarm set  OT Visit Diagnosis: Other abnormalities of gait and mobility (R26.89);Unsteadiness on feet (R26.81);Muscle weakness (generalized) (M62.81)  Time: 2179-8102 OT Time Calculation (min): 34 min Charges:  OT General Charges $OT Visit: 1 Visit OT Evaluation $OT Eval Moderate Complexity: 1 Mod OT Treatments $Therapeutic Activity: 8-22 mins  Jerilynn Birkenhead, OTS 02/02/20, 4:43 PM

## 2020-02-02 NOTE — Evaluation (Signed)
Physical Therapy Evaluation Patient Details Name: Tara [name] MRN: [id number] DOB: June 04, 1918 Today's Date: 02/02/2020   History of Present Illness  Pt is a 86 yo female that presented to ED for hyptensive emergency, also heme positive stool without indication of GI bleed, has recieved a transfusion this hospital stay. PMH of asthma, HTN, DMII, hypothyroidsm, stage IV CKD.    Clinical Impression  Pt and family asleep upon PT entering room, easily woken. Pt oriented to self, place, month, garbled/slurred speech noted and pt soft spoken, Family reported speech different from baseline and RN notified. (stated it had been different for about a week). At baseline pt able to ambulate household distances with walker and supervision, assistance needed for ADLs, family performs meals, errands, cleaning, med management. Pt denied any falls in the last 6 months.  The patient demonstrated supine <> sit with modA several times during session, as well as rolling with minA. Sit <> stand performed from elevated bed surface, maxAx2 for safety and RW, pt with deficits with initial standing balance. The patient was able to take several steps to recliner, maxA throughout for balance and RW assistance, pt fatigued quickly.  Overall the patient demonstrated deficits (see "PT Problem List") that impede the patient's functional abilities, safety, and mobility and would benefit from skilled PT intervention. Due to acute decline in functional status, recommendation is SNF. Family would benefit from further training to decide if they can physically assist pt at home, pt will need wheelchair to maximize safety if transitioning home.      Follow Up Recommendations SNF    Equipment Recommendations  Wheelchair (measurements PT) (with leg attachments, anti tip bars, seat cushion)    Recommendations for Other Services OT consult     Precautions / Restrictions Precautions Precautions: Fall Restrictions Weight Bearing  Restrictions: No      Mobility  Bed Mobility Overal bed mobility: Needs Assistance Bed Mobility: Supine to Sit;Sit to Supine;Rolling Rolling: Min assist   Supine to sit: Mod assist;HOB elevated Sit to supine: Mod assist;HOB elevated   General bed mobility comments: per family pt needs assistance with bed mobility at baseline  Transfers Overall transfer level: Needs assistance Equipment used: Rolling walker (2 wheeled) Transfers: Sit to/from Stand Sit to Stand: Max assist;+2 safety/equipment;From elevated surface         General transfer comment: performed twice from EOB, improved balance noted second attempt but pt still requires significant assist to come fully into standing and remain balanced  Ambulation/Gait Ambulation/Gait assistance: Mod assist Gait Distance (Feet): 2 Feet Assistive device: Rolling walker (2 wheeled)       General Gait Details: very fatigued, unsteady, modA to maintain balance step by step sequencing and assistance with RW  Stairs            Wheelchair Mobility    Modified Rankin (Stroke Patients Only)       Balance Overall balance assessment: Needs assistance Sitting-balance support: Feet unsupported;Bilateral upper extremity supported Sitting balance-Leahy Scale: Poor       Standing balance-Leahy Scale: Poor                               Pertinent Vitals/Pain Pain Assessment: No/denies pain    Home Living Family/patient expects to be discharged to:: Private residence Living Arrangements: Children Available Help at Discharge: Family;Personal care attendant;Available 24 hours/day Type of Home: House Home Access: Ramped entrance     Home Layout: One level Home  Equipment: Gilford Rile - 2 wheels;Walker - 4 wheels;Shower seat Additional Comments: no falls in the last 6 months    Prior Function Level of Independence: Needs assistance   Gait / Transfers Assistance Needed: ambulates with RW in house with supervision  at baseline  ADL's / Homemaking Assistance Needed: daughters assist with meals, errands, meds, bathing, dressing        Hand Dominance   Dominant Hand: Right    Extremity/Trunk Assessment   Upper Extremity Assessment Upper Extremity Assessment: Generalized weakness    Lower Extremity Assessment Lower Extremity Assessment: Generalized weakness    Cervical / Trunk Assessment Cervical / Trunk Assessment: Kyphotic  Communication   Communication: HOH  Cognition Arousal/Alertness: Awake/alert Behavior During Therapy: WFL for tasks assessed/performed Overall Cognitive Status: Impaired/Different from baseline                                 General Comments: family reported slurred speech for the last week as well as perseveration today during conversation. Pt perseverated on "closing a door" per family. able to follow all commands, behavior appropriate, oriented to self, place, month      General Comments      Exercises Other Exercises Other Exercises: pt able to perform roling minA and bridge to assist with doffing mesh underwear   Assessment/Plan    PT Assessment Patient needs continued PT services  PT Problem List Decreased strength;Decreased mobility;Decreased activity tolerance;Decreased balance;Decreased knowledge of use of DME       PT Treatment Interventions DME instruction;Therapeutic exercise;Gait training;Wheelchair mobility training;Balance training;Stair training;Neuromuscular re-education;Functional mobility training;Therapeutic activities;Patient/family education    PT Goals (Current goals can be found in the Care Plan section)  Acute Rehab PT Goals Patient Stated Goal: to get stronger PT Goal Formulation: With patient/family Time For Goal Achievement: 02/16/20 Potential to Achieve Goals: Fair    Frequency Min 2X/week   Barriers to discharge        Co-evaluation               AM-PAC PT "6 Clicks" Mobility  Outcome Measure  Help needed turning from your back to your side while in a flat bed without using bedrails?: A Little Help needed moving from lying on your back to sitting on the side of a flat bed without using bedrails?: A Little Help needed moving to and from a bed to a chair (including a wheelchair)?: A Lot Help needed standing up from a chair using your arms (e.g., wheelchair or bedside chair)?: A Lot Help needed to walk in hospital room?: A Lot Help needed climbing 3-5 steps with a railing? : Total 6 Click Score: 13    End of Session Equipment Utilized During Treatment: Gait belt Activity Tolerance: Patient tolerated treatment well Patient left: in chair;with call bell/phone within reach;with family/visitor present;with nursing/sitter in room Nurse Communication: Mobility status PT Visit Diagnosis: Other abnormalities of gait and mobility (R26.89);Muscle weakness (generalized) (M62.81);Difficulty in walking, not elsewhere classified (R26.2)    Time: 6226-3335 PT Time Calculation (min) (ACUTE ONLY): 44 min   Charges:   PT Evaluation $PT Eval Low Complexity: 1 Low PT Treatments $Therapeutic Exercise: 23-37 mins $Therapeutic Activity: 8-22 mins        Lieutenant Diego PT, DPT 3:12 PM,02/02/20

## 2020-02-03 LAB — CBC WITH DIFFERENTIAL/PLATELET
Abs Immature Granulocytes: 0.02 10*3/uL (ref 0.00–0.07)
Basophils Absolute: 0 10*3/uL (ref 0.0–0.1)
Basophils Relative: 0 %
Eosinophils Absolute: 0.1 10*3/uL (ref 0.0–0.5)
Eosinophils Relative: 2 %
HCT: 23.9 % — ABNORMAL LOW (ref 36.0–46.0)
Hemoglobin: 8 g/dL — ABNORMAL LOW (ref 12.0–15.0)
Immature Granulocytes: 0 %
Lymphocytes Relative: 16 %
Lymphs Abs: 0.8 10*3/uL (ref 0.7–4.0)
MCH: 32.1 pg (ref 26.0–34.0)
MCHC: 33.5 g/dL (ref 30.0–36.0)
MCV: 96 fL (ref 80.0–100.0)
Monocytes Absolute: 0.3 10*3/uL (ref 0.1–1.0)
Monocytes Relative: 6 %
Neutro Abs: 3.8 10*3/uL (ref 1.7–7.7)
Neutrophils Relative %: 76 %
Platelets: 173 10*3/uL (ref 150–400)
RBC: 2.49 MIL/uL — ABNORMAL LOW (ref 3.87–5.11)
RDW: 17.5 % — ABNORMAL HIGH (ref 11.5–15.5)
WBC: 5 10*3/uL (ref 4.0–10.5)
nRBC: 0.4 % — ABNORMAL HIGH (ref 0.0–0.2)

## 2020-02-03 LAB — GLUCOSE, CAPILLARY
Glucose-Capillary: 105 mg/dL — ABNORMAL HIGH (ref 70–99)
Glucose-Capillary: 104 mg/dL — ABNORMAL HIGH (ref 70–99)
Glucose-Capillary: 139 mg/dL — ABNORMAL HIGH (ref 70–99)

## 2020-02-03 LAB — BASIC METABOLIC PANEL
Anion gap: 10 (ref 5–15)
BUN: 91 mg/dL — ABNORMAL HIGH (ref 8–23)
CO2: 21 mmol/L — ABNORMAL LOW (ref 22–32)
Calcium: 8.3 mg/dL — ABNORMAL LOW (ref 8.9–10.3)
Chloride: 111 mmol/L (ref 98–111)
Creatinine, Ser: 2.97 mg/dL — ABNORMAL HIGH (ref 0.44–1.00)
GFR calc Af Amer: 14 mL/min — ABNORMAL LOW (ref >60)
GFR calc non Af Amer: 12 mL/min — ABNORMAL LOW (ref >60)
Glucose, Bld: 111 mg/dL — ABNORMAL HIGH (ref 70–99)
Potassium: 4.4 mmol/L (ref 3.5–5.1)
Sodium: 142 mmol/L (ref 135–145)

## 2020-02-03 LAB — T3: T3, Total: 21 ng/dL — ABNORMAL LOW (ref 71–180)

## 2020-02-03 LAB — HEMOGLOBIN A1C
Hgb A1c MFr Bld: 4.9 % (ref 4.8–5.6)
Mean Plasma Glucose: 94 mg/dL

## 2020-02-03 MED ORDER — HYDRALAZINE HCL 50 MG PO TABS
100.0000 mg | ORAL_TABLET | Freq: Four times a day (QID) | ORAL | Status: DC
  Administered 2020-02-03 – 2020-02-04 (×8): 100 mg via ORAL
  Filled 2020-02-03 (×8): qty 2

## 2020-02-03 NOTE — Progress Notes (Signed)
[facility]  ROUNDING NOTE   Subjective:  Patient well-known to [place] from the office. We last saw her in January.  At that time her creatinine was 2.06 with an EGFR 23. She was found to have heme positive stools with severe anemia this admission. Also has hypertensive emergency with acute kidney injury. Creatinine currently up to 2.97 with an EGFR of 14.  Objective:  Vital signs in last 24 hours:  Temp:  [97.5 F (36.4 C)-98.2 F (36.8 C)] 98.1 F (36.7 C) (06/25 0800) Pulse Rate:  [54-60] 57 (06/25 0700) Resp:  [10-18] 12 (06/25 0700) BP: (159-206)/(56-88) 184/79 (06/25 0700) SpO2:  [98 %-100 %] 98 % (06/25 0700) Weight:  [37.3 kg] 37.3 kg (06/25 0600)  Weight change:  Filed Weights   02/01/20 0359 02/01/20 0645 02/03/20 0600  Weight: 36.6 kg 36.4 kg 37.3 kg    Intake/Output: I/O last 3 completed shifts: In: 1591.3 [P.O.:100; I.V.:1491.3] Out: 1600 [Urine:1600]   Intake/Output this shift:  No intake/output data recorded.  Physical Exam: General: Frail-appearing female  Head: Normocephalic, atraumatic. Moist oral mucosal membranes  Eyes: Anicteric  Neck: Supple, trachea midline  Lungs:  Clear to auscultation, normal effort  Heart: S1S2 no rubs  Abdomen:  Soft, nontender, bowel sounds present  Extremities: No peripheral edema.  Neurologic: Arousable but not following commands  Skin: No lesions       Basic Metabolic Panel: Recent Labs  Lab 01/31/20 1738 01/31/20 1738 02/01/20 0224 02/02/20 0411 02/03/20 0519  NA 144  --  145 143 142  K 4.7  --  4.6 4.8 4.4  CL 117*  --  117* 114* 111  CO2 21*  --  20* 21* 21*  GLUCOSE 140*  --  144* 107* 111*  BUN 73*  --  72* 86* 91*  CREATININE 2.54*  --  2.50* 2.85* 2.97*  CALCIUM 9.6   < > 9.6 8.6* 8.3*  MG  --   --  2.1  --   --    < > = values in this interval not displayed.    Liver Function Tests: Recent Labs  Lab 01/31/20 1738  AST 24  ALT 11  ALKPHOS 39  BILITOT 0.8  PROT 6.7  ALBUMIN  3.1*   No results for input(s): LIPASE, AMYLASE in the last 168 hours. No results for input(s): AMMONIA in the last 168 hours.  CBC: Recent Labs  Lab 01/31/20 1738 01/31/20 2033 02/01/20 0224 02/01/20 0844 02/01/20 1419 02/02/20 0411 02/03/20 0519  WBC 4.8  --  4.8  --   --  4.7 5.0  NEUTROABS 3.1  --   --   --   --  4.0 3.8  HGB 5.9*   < > 9.3* 10.7* 9.2* 8.4* 8.0*  HCT 18.6*   < > 28.7* 31.7* 27.2* 24.6* 23.9*  MCV 103.3*  --  97.6  --   --  92.8 96.0  PLT 188  --  199  --   --  176 173   < > = values in this interval not displayed.    Cardiac Enzymes: No results for input(s): CKTOTAL, CKMB, CKMBINDEX, TROPONINI in the last 168 hours.  BNP: Invalid input(s): POCBNP  CBG: Recent Labs  Lab 02/02/20 1114 02/02/20 1530 02/02/20 2139 02/03/20 0733 02/03/20 1204  GLUCAP 115* 197* 168* 105* 104*    Microbiology: Results for orders placed or performed during the hospital encounter of 01/31/20  SARS Coronavirus 2 by RT PCR (hospital order, performed in [facility] hospital lab) Nasopharyngeal Nasopharyngeal Swab     Status: None   Collection Time: 01/31/20  7:11 PM   Specimen: Nasopharyngeal Swab  Result Value Ref Range Status   SARS Coronavirus 2 NEGATIVE NEGATIVE Final    Comment: (NOTE) SARS-CoV-2 target nucleic acids are NOT DETECTED.  The SARS-CoV-2 RNA is generally detectable in upper and lower respiratory specimens during the acute phase of infection. The lowest concentration of SARS-CoV-2 viral copies this assay can detect is 250 copies / mL. A negative result does not preclude SARS-CoV-2 infection and should not be used as the sole basis for treatment or other patient management decisions.  A negative result may occur with improper specimen collection / handling, submission of specimen other than nasopharyngeal swab, presence of viral mutation(s) within the areas targeted by this assay, and inadequate number of viral copies (<250 copies / mL). A negative  result must be combined with clinical observations, patient history, and epidemiological information.  Fact Sheet for Patients:   StrictlyIdeas.no  Fact Sheet for Healthcare Providers: BankingDealers.co.za  This test is not yet approved or  cleared by the [place] FDA and has been authorized for detection and/or diagnosis of SARS-CoV-2 by FDA under an Emergency Use Authorization (EUA).  This EUA will remain in effect (meaning this test can be used) for the duration of the COVID-19 declaration under Section 564(b)(1) of the Act, 21 U.S.C. section 360bbb-3(b)(1), unless the authorization is terminated or revoked sooner.  Performed at [facility], [address] [place], [place] [address]     Coagulation Studies: No results for input(s): LABPROT, INR in the last 72 hours.  Urinalysis: No results for input(s): COLORURINE, LABSPEC, PHURINE, GLUCOSEU, HGBUR, BILIRUBINUR, KETONESUR, PROTEINUR, UROBILINOGEN, NITRITE, LEUKOCYTESUR in the last 72 hours.  Invalid input(s): APPERANCEUR    Imaging: No results found.   Medications:    lactated ringers 50 mL/hr at 02/03/20 0600   niCARDipine Stopped (02/01/20 1614)    amLODipine  10 mg Oral Daily   Chlorhexidine Gluconate Cloth  6 each Topical Daily   cloNIDine  0.1 mg Oral TID   doxazosin  1 mg Oral Daily   feeding supplement (ENSURE ENLIVE)  237 mL Oral TID BM   hydrALAZINE  100 mg Oral QID   insulin aspart  0-9 Units Subcutaneous TID PC & HS   labetalol  100 mg Oral BID   levothyroxine  50 mcg Oral Q0600   loratadine  10 mg Oral Daily   multivitamin with minerals  1 tablet Oral Daily   [START ON 02/04/2020] pantoprazole  40 mg Intravenous Q12H   acetaminophen **OR** acetaminophen, labetalol, ondansetron **OR** ondansetron (ZOFRAN) IV, traZODone  Assessment/ Plan:  86 y.o. female with past medical history of diabetes mellitus type 2, hypertension,  asthma, peripheral vascular disease, chronic kidney disease stage IV, lower extremity edema who was admitted with heme positive stools, anemia, and hypertensive urgency.  1.  Acute kidney injury/chronic kidney disease stage IV.  Suspect acute kidney injury due to alterations in cardiorenal hemodynamics as she has been on vasoactive medications.  No acute indication for dialysis at the moment.  In addition would make poor candidate for long-term dialysis.  Continue supportive care for now and avoid hypotension or large swings in blood pressure as possible.  2.  Hypertension.  Weaned off of nicardipine drip.  Continue amlodipine, clonidine, hydralazine, and labetalol for now.  3.  Anemia of chronic kidney disease/anemia of blood loss.  Work-up per hospitalist.  Hold off on  Epogen for now.  4.  Thanks for consultation.   LOS: 3 Kamarian Sahakian 6/25/202112:42 PM

## 2020-02-03 NOTE — Progress Notes (Signed)
Physical Therapy Treatment Patient Details Name: Tara [name] MRN: [id number] DOB: 1918-07-25 Today's Date: 02/03/2020    History of Present Illness Pt is a 86 yo female that presented to ED for hyptensive emergency, also heme positive stool without indication of GI bleed, has recieved a transfusion this hospital stay. PMH of asthma, HTN, DMII, hypothyroidsm, stage IV CKD.    PT Comments    Pt easily woken, family at bedside, denied pain. Pt with much clearer speech today, able to respond to commands with ease. Overall the patient demonstrated improved mobility. Supine to sit minA, and several sit <> stands performed. Sit <> Stand from EOB, BSC and recliner (4 repetitions) to maximize strength, family training, and pt independence. Pt able to transfer once with CGA with adequate UE support, minA for initial standing balance. Pt able to take several steps from bed, to [facility], to recliner. improved ability to participate, minA for RW management and unsteadiness. The patient would benefit from further skilled PT intervention to continue to progress towards goals. Recommendation remains appropriate.       Follow Up Recommendations  SNF     Equipment Recommendations  Wheelchair (measurements PT)    Recommendations for Other Services OT consult     Precautions / Restrictions Precautions Precautions: Fall Restrictions Weight Bearing Restrictions: No    Mobility  Bed Mobility Overal bed mobility: Needs Assistance Bed Mobility: Supine to Sit     Supine to sit: Min assist        Transfers Overall transfer level: Needs assistance Equipment used: Rolling walker (2 wheeled) Transfers: Sit to/from Stand Sit to Stand: Min assist;Mod assist;Min guard         General transfer comment: performed several times to maximize strength, family training, and pt independence. Pt able to transfer once with CGA with adequate UE support, minA for initial standing  balance  Ambulation/Gait Ambulation/Gait assistance: Min assist Gait Distance (Feet): 4 Feet Assistive device: Rolling walker (2 wheeled)       General Gait Details: Pt able to take several steps from bed, to [facility], to recliner. improved ability to participate, minA for RW management and unsteadiness.   Stairs             Wheelchair Mobility    Modified Rankin (Stroke Patients Only)       Balance Overall balance assessment: Needs assistance Sitting-balance support: Feet supported Sitting balance-Leahy Scale: Fair Sitting balance - Comments: fatigues but able to maintain static sitting balance   Standing balance support: Bilateral upper extremity supported Standing balance-Leahy Scale: Poor Standing balance comment: requires support to achieve and maintain static standing balance                            Cognition Arousal/Alertness: Awake/alert Behavior During Therapy: WFL for tasks assessed/performed Overall Cognitive Status: Within Functional Limits for tasks assessed                                 General Comments: pt is alert and oriented to self, place and situation, able to follow commands pt with improved speech this session, much clearer      Exercises Other Exercises Other Exercises: Pt unable to perform wiping after BM, maxA to assist with pt in standing    General Comments        Pertinent Vitals/Pain Pain Assessment: No/denies pain    Home Living  Prior Function            PT Goals (current goals can now be found in the care plan section) Progress towards PT goals: Progressing toward goals    Frequency    Min 2X/week      PT Plan      Co-evaluation              AM-PAC PT "6 Clicks" Mobility   Outcome Measure  Help needed turning from your back to your side while in a flat bed without using bedrails?: A Little Help needed moving from lying on your back to sitting  on the side of a flat bed without using bedrails?: A Little Help needed moving to and from a bed to a chair (including a wheelchair)?: A Lot Help needed standing up from a chair using your arms (e.g., wheelchair or bedside chair)?: A Little Help needed to walk in hospital room?: A Lot Help needed climbing 3-5 steps with a railing? : Total 6 Click Score: 14    End of Session Equipment Utilized During Treatment: Gait belt Activity Tolerance: Patient tolerated treatment well Patient left: in chair;with call bell/phone within reach;with family/visitor present Nurse Communication: Mobility status PT Visit Diagnosis: Other abnormalities of gait and mobility (R26.89);Muscle weakness (generalized) (M62.81);Difficulty in walking, not elsewhere classified (R26.2)     Time: 0814-4818 PT Time Calculation (min) (ACUTE ONLY): 41 min  Charges:  $Therapeutic Exercise: 38-52 mins                     Lieutenant Diego PT, DPT 4:01 PM,02/03/20

## 2020-02-03 NOTE — TOC Progression Note (Addendum)
Transition of Care [facility]) - Progression Note    Patient Details  Name: Tara [name] MRN: [id number] Date of Birth: [birth year]  Transition of Care [facility]) CM/SW [place], [place] Phone Number: 02/03/2020, 2:18 PM  Clinical Narrative:     CSW spoke with patient daughter Wynona Luna [phone] about patient d/c disposition.  CSW informed Ms. Stone of the current recommendation for the patient after discharge and went over the different options. This CSw explained the process to Ms. Stone and gave her the information for [email or web address]. Ms. Joaquim Lai stated she would need to speak with her sister and mother before they can make a decision.  Ms. Joaquim Lai was adamant they did not want the patient to be placed at [facility].  This CSW will contact MS. Stone tomorrow at [employer], for update.        Expected Discharge Plan and Services                                                 Social Determinants of Health (SDOH) Interventions    Readmission Risk Interventions No flowsheet data found.

## 2020-02-03 NOTE — Progress Notes (Signed)
SUBJECTIVE: Patient is feeling much better   Vitals:   02/03/20 0600 02/03/20 0615 02/03/20 0700 02/03/20 0800  BP: (!) 206/73 (!) 180/73 (!) 184/79   Pulse: 60 (!) 56 (!) 57   Resp: 14 15 12    Temp:    98.1 F (36.7 C)  TempSrc:    Oral  SpO2: 99% 100% 98%   Weight: 37.3 kg     Height:        Intake/Output Summary (Last 24 hours) at 02/03/2020 1228 Last data filed at 02/03/2020 0600 Gross per 24 hour  Intake 967.88 ml  Output 900 ml  Net 67.88 ml    LABS: Basic Metabolic Panel: Recent Labs    02/01/20 0224 02/01/20 0224 02/02/20 0411 02/03/20 0519  NA 145   < > 143 142  K 4.6   < > 4.8 4.4  CL 117*   < > 114* 111  CO2 20*   < > 21* 21*  GLUCOSE 144*   < > 107* 111*  BUN 72*   < > 86* 91*  CREATININE 2.50*   < > 2.85* 2.97*  CALCIUM 9.6   < > 8.6* 8.3*  MG 2.1  --   --   --    < > = values in this interval not displayed.   Liver Function Tests: Recent Labs    01/31/20 1738  AST 24  ALT 11  ALKPHOS 39  BILITOT 0.8  PROT 6.7  ALBUMIN 3.1*   No results for input(s): LIPASE, AMYLASE in the last 72 hours. CBC: Recent Labs    02/02/20 0411 02/03/20 0519  WBC 4.7 5.0  NEUTROABS 4.0 3.8  HGB 8.4* 8.0*  HCT 24.6* 23.9*  MCV 92.8 96.0  PLT 176 173   Cardiac Enzymes: No results for input(s): CKTOTAL, CKMB, CKMBINDEX, TROPONINI in the last 72 hours. BNP: Invalid input(s): POCBNP D-Dimer: No results for input(s): DDIMER in the last 72 hours. Hemoglobin A1C: Recent Labs    02/01/20 0224  HGBA1C 4.9   Fasting Lipid Panel: No results for input(s): CHOL, HDL, LDLCALC, TRIG, CHOLHDL, LDLDIRECT in the last 72 hours. Thyroid Function Tests: Recent Labs    02/01/20 0224  TSH 7.720*   Anemia Panel: No results for input(s): VITAMINB12, FOLATE, FERRITIN, TIBC, IRON, RETICCTPCT in the last 72 hours.   PHYSICAL EXAM General: Well developed, well nourished, in no acute distress HEENT:  Normocephalic and atramatic Neck:  No JVD.  Lungs: Clear  bilaterally to auscultation and percussion. Heart: HRRR . Normal S1 and S2 without gallops or murmurs.  Abdomen: Bowel sounds are positive, abdomen soft and non-tender  Msk:  Back normal, normal gait. Normal strength and tone for age. Extremities: No clubbing, cyanosis or edema.   Neuro: Alert and oriented X 3. Psych:  Good affect, responds appropriately  TELEMETRY: Sinus rhythm  ASSESSMENT AND PLAN: Hypertensive heart disease with hypertrophic cardiomyopathy and severe LVH.  Advise adding chlorthalidone as blood pressure systolic over 865 and unable to increase labetalol due to occasional bradycardia.  Active Problems:   Diabetes mellitus with peripheral vascular disease (HCC)   CKD (chronic kidney disease) stage 4, GFR 15-29 ml/min (HCC)   GI bleeding   Pressure injury of skin    Tara [name] A, MD, [facility] 02/03/2020 12:28 PM

## 2020-02-03 NOTE — Progress Notes (Addendum)
Progress Note    Tara [name]  YBO:[id number] DOB: 12-17-1917  DOA: 01/31/2020 PCP: Einar Pheasant, MD      Brief Narrative:    Medical records reviewed and are as summarized below:  Tara [name] is a 86 y.o. female  with a known history of asthma, hypertension and type 2 diabetes mellitus, who presented to the emergency room with acute onset of generalized weakness, cough, exertional shortness of breath.      Assessment/Plan:   Active Problems:   Diabetes mellitus with peripheral vascular disease (HCC)   CKD (chronic kidney disease) stage 4, GFR 15-29 ml/min (HCC)   GI bleeding   Pressure injury of skin    Positive heme stools with severe anemia in a patient with history of anemia of chronic disease S/p transfusion with 1 unit of packed red blood cells.  H&H is stable.  Patient was evaluated by gastroenterologist and conservative management was recommended.  Patient's daughter does not want any invasive procedures.  Continue Protonix  Hypertensive emergency BP still uncontrolled.  She has been off of IV Cardene drip since 02/01/2020.   Continue antihypertensives.  Doxazosin was added on 02/02/2020 for adequate BP control.  Hydralazine has been increased to 100 mg every 6 hours.  Acute new onset likely diastolic CHF with pulmonary edema.  This could be secondary to hypertensive urgency and high-output failure. Monitor daily weights, BMP and urine output.  Patient has been evaluated by cardiologist.  2D echo showed EF estimated >27%, grade 1 diastolic dysfunction, moderately elevated pulmonary artery systolic pressure.  AKI on Stage IV CKD -Creatinine is getting worse.  Continue low rate IV fluids.  Monitor BMP.  Consult with nephrologist to assist with management.  Hypothyroidism. Continue Synthroid  Type 2 diabetes mellitus. Follow-up as needed for hyperglycemia  History of stroke/chronic lacunar infarcts Hold aspirin for now.  Sinus  bradycardia Asymptomatic.   Generalized weakness PT recommend discharge to SNF.  Follow-up with social worker to assist with disposition.  Stage II sacral decubitus ulcer  Present on admission.  Turn patient every 2 hours.  Continue local wound care.  Body mass index is 16.06 kg/m.  Diet Order            DIET DYS 2 Room service appropriate? Yes; Fluid consistency: Thin  Diet effective now                       Medications:   . amLODipine  10 mg Oral Daily  . Chlorhexidine Gluconate Cloth  6 each Topical Daily  . cloNIDine  0.1 mg Oral TID  . doxazosin  1 mg Oral Daily  . feeding supplement (ENSURE ENLIVE)  237 mL Oral TID BM  . hydrALAZINE  100 mg Oral QID  . insulin aspart  0-9 Units Subcutaneous TID PC & HS  . labetalol  100 mg Oral BID  . levothyroxine  50 mcg Oral Q0600  . loratadine  10 mg Oral Daily  . multivitamin with minerals  1 tablet Oral Daily  . [START ON 02/04/2020] pantoprazole  40 mg Intravenous Q12H   Continuous Infusions: . lactated ringers 50 mL/hr at 02/03/20 0600  . niCARDipine Stopped (02/01/20 1614)     Anti-infectives (From admission, onward)   None             Family Communication/Anticipated D/C date and plan/Code Status   DVT prophylaxis: SCDs Start: 01/31/20 1948     Code Status: DNR  Family Communication: Plan discussed with her daughter, [name] at the bedside Disposition Plan:    Status is: Inpatient  Remains inpatient appropriate because:IV treatments appropriate due to intensity of illness or inability to take PO and Inpatient level of care appropriate due to severity of illness   Dispo: The patient is from: Home              Anticipated d/c is to: Home              Anticipated d/c date is: 2 days              Patient currently is not medically stable to d/c.           Subjective:   No complaints.  No headache, dizziness, shortness of breath or chest pain.  Her daughter, [name] is at the  bedside.  Objective:    Vitals:   02/03/20 0600 02/03/20 0615 02/03/20 0700 02/03/20 0800  BP: (!) 206/73 (!) 180/73 (!) 184/79   Pulse: 60 (!) 56 (!) 57   Resp: 14 15 12    Temp:    98.1 F (36.7 C)  TempSrc:    Oral  SpO2: 99% 100% 98%   Weight: 37.3 kg     Height:       No data found.   Intake/Output Summary (Last 24 hours) at 02/03/2020 1145 Last data filed at 02/03/2020 0600 Gross per 24 hour  Intake 967.88 ml  Output 900 ml  Net 67.88 ml   Filed Weights   02/01/20 0359 02/01/20 0645 02/03/20 0600  Weight: 36.6 kg 36.4 kg 37.3 kg    Exam:  GEN: No acute distress SKIN: Stage II sacral decubitus ulcer EYES: No pallor or icterus ENT: MMM CV: Regular rate and rhythm PULM: No wheezing or rales. ABD: soft, ND, NT, +BS CNS: AAO x 3, non focal EXT: No edema.  Mild b/l leg tenderness (increased sensitivity to touch)   Data Reviewed:   I have personally reviewed following labs and imaging studies:  Labs: Labs show the following:   Basic Metabolic Panel: Recent Labs  Lab 01/31/20 1738 01/31/20 1738 02/01/20 0224 02/01/20 0224 02/02/20 0411 02/03/20 0519  NA 144  --  145  --  143 142  K 4.7   < > 4.6   < > 4.8 4.4  CL 117*  --  117*  --  114* 111  CO2 21*  --  20*  --  21* 21*  GLUCOSE 140*  --  144*  --  107* 111*  BUN 73*  --  72*  --  86* 91*  CREATININE 2.54*  --  2.50*  --  2.85* 2.97*  CALCIUM 9.6  --  9.6  --  8.6* 8.3*  MG  --   --  2.1  --   --   --    < > = values in this interval not displayed.   GFR Estimated Creatinine Clearance: 6.1 mL/min (A) (by C-G formula based on SCr of 2.97 mg/dL (H)). Liver Function Tests: Recent Labs  Lab 01/31/20 1738  AST 24  ALT 11  ALKPHOS 39  BILITOT 0.8  PROT 6.7  ALBUMIN 3.1*   No results for input(s): LIPASE, AMYLASE in the last 168 hours. No results for input(s): AMMONIA in the last 168 hours. Coagulation profile No results for input(s): INR, PROTIME in the last 168 hours.  CBC: Recent  Labs  Lab 01/31/20 1738 01/31/20 2033 02/01/20 0224 02/01/20 0844 02/01/20 1419  02/02/20 0411 02/03/20 0519  WBC 4.8  --  4.8  --   --  4.7 5.0  NEUTROABS 3.1  --   --   --   --  4.0 3.8  HGB 5.9*   < > 9.3* 10.7* 9.2* 8.4* 8.0*  HCT 18.6*   < > 28.7* 31.7* 27.2* 24.6* 23.9*  MCV 103.3*  --  97.6  --   --  92.8 96.0  PLT 188  --  199  --   --  176 173   < > = values in this interval not displayed.   Cardiac Enzymes: No results for input(s): CKTOTAL, CKMB, CKMBINDEX, TROPONINI in the last 168 hours. BNP (last 3 results) No results for input(s): PROBNP in the last 8760 hours. CBG: Recent Labs  Lab 02/02/20 0733 02/02/20 1114 02/02/20 1530 02/02/20 2139 02/03/20 0733  GLUCAP 87 115* 197* 168* 105*   D-Dimer: No results for input(s): DDIMER in the last 72 hours. Hgb A1c: Recent Labs    02/01/20 0224  HGBA1C 4.9   Lipid Profile: No results for input(s): CHOL, HDL, LDLCALC, TRIG, CHOLHDL, LDLDIRECT in the last 72 hours. Thyroid function studies: Recent Labs    02/01/20 0224  TSH 7.720*   Anemia work up: No results for input(s): VITAMINB12, FOLATE, FERRITIN, TIBC, IRON, RETICCTPCT in the last 72 hours. Sepsis Labs: Recent Labs  Lab 01/31/20 1738 02/01/20 0224 02/02/20 0411 02/03/20 0519  WBC 4.8 4.8 4.7 5.0    Microbiology Recent Results (from the past 240 hour(s))  SARS Coronavirus 2 by RT PCR (hospital order, performed in [facility] hospital lab) Nasopharyngeal Nasopharyngeal Swab     Status: None   Collection Time: 01/31/20  7:11 PM   Specimen: Nasopharyngeal Swab  Result Value Ref Range Status   SARS Coronavirus 2 NEGATIVE NEGATIVE Final    Comment: (NOTE) SARS-CoV-2 target nucleic acids are NOT DETECTED.  The SARS-CoV-2 RNA is generally detectable in upper and lower respiratory specimens during the acute phase of infection. The lowest concentration of SARS-CoV-2 viral copies this assay can detect is 250 copies / mL. A negative result does not  preclude SARS-CoV-2 infection and should not be used as the sole basis for treatment or other patient management decisions.  A negative result may occur with improper specimen collection / handling, submission of specimen other than nasopharyngeal swab, presence of viral mutation(s) within the areas targeted by this assay, and inadequate number of viral copies (<250 copies / mL). A negative result must be combined with clinical observations, patient history, and epidemiological information.  Fact Sheet for Patients:   StrictlyIdeas.no  Fact Sheet for Healthcare Providers: BankingDealers.co.za  This test is not yet approved or  cleared by the [place] FDA and has been authorized for detection and/or diagnosis of SARS-CoV-2 by FDA under an Emergency Use Authorization (EUA).  This EUA will remain in effect (meaning this test can be used) for the duration of the COVID-19 declaration under Section 564(b)(1) of the Act, 21 U.S.C. section 360bbb-3(b)(1), unless the authorization is terminated or revoked sooner.  Performed at [facility], [address] [place], [place] [address]     Procedures and diagnostic studies:  No results found.             LOS: 3 days   Keneth Borg  [facility]     02/03/2020, 11:45 AM

## 2020-02-03 NOTE — TOC Initial Note (Signed)
Transition of Care [facility]) - Initial/Assessment Note    Patient Details  Name: Tara [name] MRN: [id number] Date of Birth: 01-22-1918  [facility] CM/SW Contact:    Ova Freshwater Phone Number: [phone] 02/03/2020, 11:04 AM  Clinical Narrative:                  Patient [facility] to [facility] with generalized weakness.  This CSW contacted the patient's daughter Jayme Cloud, [phone] and left voicemail to discuss transition of care and d/c disposition.  CSW will attempt to contact Ms. Ricard Dillon later on today.       Patient Goals and CMS Choice        Expected Discharge Plan and Services                                                Prior Living Arrangements/Services                       Activities of Daily Living Home Assistive Devices/Equipment: Gilford Rile (specify type) ADL Screening (condition at time of admission) Patient's cognitive ability adequate to safely complete daily activities?: Yes Is the patient deaf or have difficulty hearing?: Yes Does the patient have difficulty seeing, even when wearing glasses/contacts?: Yes Does the patient have difficulty concentrating, remembering, or making decisions?: Yes Patient able to express need for assistance with ADLs?: Yes Does the patient have difficulty dressing or bathing?: Yes Independently performs ADLs?: No Communication: Needs assistance Is this a change from baseline?: Change from baseline, expected to last <3 days Dressing (OT): Needs assistance Is this a change from baseline?: Change from baseline, expected to last <3days Grooming: Needs assistance Is this a change from baseline?: Change from baseline, expected to last <3 days Feeding: Needs assistance Is this a change from baseline?: Change from baseline, expected to last <3 days Bathing: Needs assistance Is this a change from baseline?: Change from baseline, expected to last <3 days Toileting: Needs assistance Is this  a change from baseline?: Pre-admission baseline In/Out Bed: Needs assistance Is this a change from baseline?: Change from baseline, expected to last <3 days Walks in Home: Needs assistance Is this a change from baseline?: Change from baseline, expected to last <3 days Does the patient have difficulty walking or climbing stairs?: Yes Weakness of Legs: Both Weakness of Arms/Hands: Both  Permission Sought/Granted                  Emotional Assessment              Admission diagnosis:  GI bleeding [K92.2] Acute pulmonary edema ([place]) [J81.0] Weakness [R53.1] Heme positive stool [R19.5] Anemia, unspecified type [D64.9] Chronic kidney disease, unspecified CKD stage [N18.9] Patient Active Problem List   Diagnosis Date Noted  . Pressure injury of skin 02/01/2020  . GI bleeding 01/31/2020  . CKD (chronic kidney disease) stage 4, GFR 15-29 ml/min (HCC) 07/09/2019  . Elevated serum creatinine 05/26/2019  . Decreased GFR 05/26/2019  . Cerumen impaction 11/09/2018  . Wound infection after surgery 05/14/2018  . Chronic constipation 04/02/2018  . GERD without esophagitis 04/02/2018  . Abdominal pain 03/02/2018  . Hypertensive urgency, malignant 03/02/2018  . Difficulty breathing 11/03/2017  . Knee effusion, right 11/03/2017  . Fracture of superior rim of left pubis, subsequent encounter for fracture with routine healing  11/03/2017  . History of falling 11/03/2017  . Protein-calorie malnutrition, severe 11/01/2017  . Hypertensive urgency 10/31/2017  . Bilateral lower extremity edema 09/07/2017  . Right hand pain 08/25/2017  . Leg pain 08/25/2017  . Right groin hernia 07/03/2017  . Abnormal CT of the abdomen 07/03/2017  . Allergic rhinitis 06/17/2017  . Osteoporosis 03/12/2017  . Nasal obstruction 09/03/2016  . Loss of weight 03/05/2015  . Lower extremity edema 05/15/2013  . Peripheral vascular disease ([place]) 08/29/2012  . Hypertension 06/11/2012  . Hypercholesteremia  06/11/2012  . Diabetes mellitus with peripheral vascular disease ([place]) 06/11/2012  . Anemia 06/11/2012  . B12 deficiency 06/11/2012  . Hypothyroidism 06/11/2012   PCP:  Einar Pheasant, MD Pharmacy:   [facility], [address] [place] [address] Phone: [phone] Fax: [place], [place] [address] [place] [address] Phone: [phone] Fax: [phone]  [id number] - [place], [place] - [address] [place] [address] Phone: [phone] Fax: [phone]     Social Determinants of Health (SDOH) Interventions    Readmission Risk Interventions No flowsheet data found.

## 2020-02-04 DIAGNOSIS — N179 Acute kidney failure, unspecified: Secondary | ICD-10-CM | POA: Diagnosis not present

## 2020-02-04 LAB — GLUCOSE, CAPILLARY
Glucose-Capillary: 110 mg/dL — ABNORMAL HIGH (ref 70–99)
Glucose-Capillary: 153 mg/dL — ABNORMAL HIGH (ref 70–99)
Glucose-Capillary: 132 mg/dL — ABNORMAL HIGH (ref 70–99)
Glucose-Capillary: 139 mg/dL — ABNORMAL HIGH (ref 70–99)

## 2020-02-04 LAB — RETICULOCYTES
Immature Retic Fract: 9.2 % (ref 2.3–15.9)
RBC.: 2.64 MIL/uL — ABNORMAL LOW (ref 3.87–5.11)
Retic Count, Absolute: 58.9 10*3/uL (ref 19.0–186.0)
Retic Ct Pct: 2.2 % (ref 0.4–3.1)

## 2020-02-04 LAB — BASIC METABOLIC PANEL
Anion gap: 9 (ref 5–15)
BUN: 82 mg/dL — ABNORMAL HIGH (ref 8–23)
CO2: 22 mmol/L (ref 22–32)
Calcium: 8 mg/dL — ABNORMAL LOW (ref 8.9–10.3)
Chloride: 110 mmol/L (ref 98–111)
Creatinine, Ser: 2.87 mg/dL — ABNORMAL HIGH (ref 0.44–1.00)
GFR calc Af Amer: 15 mL/min — ABNORMAL LOW (ref >60)
GFR calc non Af Amer: 13 mL/min — ABNORMAL LOW (ref >60)
Glucose, Bld: 117 mg/dL — ABNORMAL HIGH (ref 70–99)
Potassium: 4.2 mmol/L (ref 3.5–5.1)
Sodium: 141 mmol/L (ref 135–145)

## 2020-02-04 LAB — IRON AND TIBC
Iron: 52 ug/dL (ref 28–170)
Saturation Ratios: 24 % (ref 10.4–31.8)
TIBC: 213 ug/dL — ABNORMAL LOW (ref 250–450)
UIBC: 161 ug/dL

## 2020-02-04 LAB — FERRITIN: Ferritin: 132 ng/mL (ref 11–307)

## 2020-02-04 LAB — FOLATE: Folate: 12.1 ng/mL (ref >5.9)

## 2020-02-04 LAB — VITAMIN B12: Vitamin B-12: 358 pg/mL (ref 180–914)

## 2020-02-04 MED ORDER — INSULIN ASPART 100 UNIT/ML ~~LOC~~ SOLN
0.0000 [IU] | Freq: Three times a day (TID) | SUBCUTANEOUS | Status: DC
  Administered 2020-02-04 (×2): 1 [IU] via SUBCUTANEOUS
  Administered 2020-02-05: 4 [IU] via SUBCUTANEOUS
  Administered 2020-02-05 – 2020-02-06 (×2): 2 [IU] via SUBCUTANEOUS
  Filled 2020-02-04 (×5): qty 1

## 2020-02-04 MED ORDER — LABETALOL HCL 5 MG/ML IV SOLN
20.0000 mg | INTRAVENOUS | Status: DC | PRN
  Administered 2020-02-05 – 2020-02-07 (×3): 20 mg via INTRAVENOUS
  Filled 2020-02-04 (×3): qty 4

## 2020-02-04 NOTE — Progress Notes (Signed)
Tara [name]  MRN: [id number]  DOB/AGE: 1918/07/21 86 y.o.  Primary Care Physician:Scott, Randell Patient, MD  Admit date: 01/31/2020  Chief Complaint:  Chief Complaint  Patient presents with  . Weakness    S-Pt presented on  01/31/2020 with  Chief Complaint  Patient presents with  . Weakness  .    Pt today feels better. Patient offers no specific complaints    Patient daughter was present in the room, no concerns as per daughter either.   Medications . amLODipine  10 mg Oral Daily  . Chlorhexidine Gluconate Cloth  6 each Topical Daily  . cloNIDine  0.1 mg Oral TID  . doxazosin  1 mg Oral Daily  . feeding supplement (ENSURE ENLIVE)  237 mL Oral TID BM  . hydrALAZINE  100 mg Oral QID  . insulin aspart  0-9 Units Subcutaneous TID PC & HS  . labetalol  100 mg Oral BID  . levothyroxine  50 mcg Oral Q0600  . loratadine  10 mg Oral Daily  . multivitamin with minerals  1 tablet Oral Daily  . pantoprazole  40 mg Intravenous Q12H         GEZ:MOQHU from the symptoms mentioned above,there are no other symptoms referable to all systems reviewed.  Physical Exam: Vital signs in last 24 hours: Temp:  [97.3 F (36.3 C)-98.2 F (36.8 C)] 97.3 F (36.3 C) (06/26 0830) Pulse Rate:  [54-75] 65 (06/26 0830) Resp:  [10-22] 13 (06/26 0830) BP: (127-220)/(55-91) 153/66 (06/26 0830) SpO2:  [96 %-99 %] 98 % (06/26 0830) Weight:  [37.1 kg] 37.1 kg (06/26 0600) Weight change: -0.2 kg Last BM Date: 02/04/20  Intake/Output from previous day: 06/25 0701 - 06/26 0700 In: 1587.4 [P.O.:250; I.V.:1337.4] Out: 950 [Urine:950] No intake/output data recorded.   Physical Exam: General- pt is awake,alert, oriented to time place and person Resp- No acute REsp distress, CTA B/L NO Rhonchi CVS- S1S2 regular in rate and rhythm GIT- BS+, soft, NT, ND EXT- NO LE Edema, Cyanosis   Lab Results: CBC Recent Labs    02/02/20 0411 02/03/20 0519  WBC 4.7 5.0  HGB 8.4* 8.0*  HCT 24.6* 23.9*  PLT  176 173    BMET Recent Labs    02/03/20 0519 02/04/20 0454  NA 142 141  K 4.4 4.2  CL 111 110  CO2 21* 22  GLUCOSE 111* 117*  BUN 91* 82*  CREATININE 2.97* 2.87*  CALCIUM 8.3* 8.0*    Creatinine trend 2021 2.5==>2.8--2.9 2020 1.0--2.4 2019 0.9--1.54  MICRO Recent Results (from the past 240 hour(s))  SARS Coronavirus 2 by RT PCR (hospital order, performed in [facility] hospital lab) Nasopharyngeal Nasopharyngeal Swab     Status: None   Collection Time: 01/31/20  7:11 PM   Specimen: Nasopharyngeal Swab  Result Value Ref Range Status   SARS Coronavirus 2 NEGATIVE NEGATIVE Final    Comment: (NOTE) SARS-CoV-2 target nucleic acids are NOT DETECTED.  The SARS-CoV-2 RNA is generally detectable in upper and lower respiratory specimens during the acute phase of infection. The lowest concentration of SARS-CoV-2 viral copies this assay can detect is 250 copies / mL. A negative result does not preclude SARS-CoV-2 infection and should not be used as the sole basis for treatment or other patient management decisions.  A negative result may occur with improper specimen collection / handling, submission of specimen other than nasopharyngeal swab, presence of viral mutation(s) within the areas targeted by this assay, and inadequate number of viral copies (<250 copies /  mL). A negative result must be combined with clinical observations, patient history, and epidemiological information.  Fact Sheet for Patients:   StrictlyIdeas.no  Fact Sheet for Healthcare Providers: BankingDealers.co.za  This test is not yet approved or  cleared by the [place] FDA and has been authorized for detection and/or diagnosis of SARS-CoV-2 by FDA under an Emergency Use Authorization (EUA).  This EUA will remain in effect (meaning this test can be used) for the duration of the COVID-19 declaration under Section 564(b)(1) of the Act, 21 U.S.C. section  360bbb-3(b)(1), unless the authorization is terminated or revoked sooner.  Performed at [facility], [address], [place], [place] [address]       Lab Results  Component Value Date   CALCIUM 8.0 (L) 02/04/2020   PHOS 3.3 05/19/2018       2D echo done on June 2021 showed 1. Asymetricle septal hypertophy with IHSS. Left ventricular ejection  fraction, by estimation, is >75%. The left ventricle has hyperdynamic  function. The left ventricle has no regional wall motion abnormalities.  There is severe asymmetric left  ventricular hypertrophy of the basal-septal segment. Left ventricular  diastolic parameters are consistent with Grade I diastolic dysfunction  (impaired relaxation).       Impression:  Patient is a 86 year old African-American female with a past medical history of diabetes mellitus, hypertension, asthma, peripheral vascular disease, CKD stage IV, lower extremity edema who was admitted with chief complaint of heme positive stools, anemia and hypertensive urgency   1)Renal  AKI secondary to ATN               AKI on CKD               CKD stage 4.               CKD since 2019               CKD secondary to diabetes mellitus.                Possible contribution from hypertension/age so she declined as patient is 86 years old                Progression of CKD marked with multiple AKI is                             Patient creatinine is stable  2)HTN admitted with HTN emergency   blood pressure is much better  Medication-  On Calcium Channel Blockers-on IV Nicardipine drip-being tapered off slowly  On Alpha and beta Blockers. On alpha blockers On Vasodilators. On [employer].   3)Anemia of chronic disease complicated by heme positive stools  HGb is not at goal (9--11) We will ask for anemia profile Patient was seen by GI and conservative management was recommended, patient daughter also does not want any invasive  measures  We will ask for anemia profile  4) secondary hyperparathyroidism -CKD Mineral-Bone Disorder   Secondary Hyperparathyroidism  Present. Phosphorus at goal. Phosphorus was higher in the past now it is much better  5) diastolic CHF Patient is now better  6) electrolytes   sodium Normonatremic   potassium Normokalemic    7)Acid base Co2 at goal     Plan:   We will ask for anemia profile and then decide about IV iron versus Epogen    Dryden Tapley s Payson Evrard 02/04/2020, 12:00 PM

## 2020-02-04 NOTE — Progress Notes (Addendum)
Progress Note    Tara [name]  ESP:[id number] DOB: 30-Oct-1917  DOA: 01/31/2020 PCP: Einar Pheasant, MD      Brief Narrative:    Medical records reviewed and are as summarized below:  Tara [name] is a 86 y.o. female  with a known history of asthma, hypertension and type 2 diabetes mellitus, who presented to the emergency room with acute onset of generalized weakness, cough, exertional shortness of breath.      Assessment/Plan:   Active Problems:   Diabetes mellitus with peripheral vascular disease (HCC)   CKD (chronic kidney disease) stage 4, GFR 15-29 ml/min (HCC)   GI bleeding   Pressure injury of skin   AKI (acute kidney injury) (HCC)    Positive heme stools with severe anemia in a patient with history of anemia of chronic disease S/p transfusion with 1 unit of packed red blood cells on 6/23.  H&H is stable.  Patient was evaluated by gastroenterologist and conservative management was recommended.  Patient's daughter does not want any invasive procedures.  Continue Protonix  Hypertensive emergency Cardene drip was restarted last night because of uncontrolled blood pressure. Taper off Cardene drip as able. Doxazosin was added on 02/02/2020 for adequate BP control.  Hydralazine was increased to 100 mg every 6 hours on 6/25.  Acute new onset likely diastolic CHF with pulmonary edema.  This could be secondary to hypertensive urgency and high-output failure. Monitor daily weights, BMP and urine output.  Patient has been evaluated by cardiologist.  2D echo showed EF estimated >63%, grade 1 diastolic dysfunction, moderately elevated pulmonary artery systolic pressure.  AKI on Stage IV CKD -Creatinine is trending down. Continue low rate IV fluids.  Monitor BMP. Follow up with nephrologist to assist with management.  Hypothyroidism. Continue Synthroid  Type 2 diabetes mellitus. Follow-up as needed for hyperglycemia  History of stroke/chronic lacunar  infarcts Hold aspirin for now.  Sinus bradycardia Asymptomatic.   Generalized weakness PT recommend discharge to SNF.  Follow-up with social worker to assist with disposition.  Stage II sacral decubitus ulcer  Present on admission.  Turn patient every 2 hours.  Continue local wound care.  Body mass index is 15.97 kg/m.  Diet Order            DIET DYS 2 Room service appropriate? Yes; Fluid consistency: Thin  Diet effective now                       Medications:   . amLODipine  10 mg Oral Daily  . Chlorhexidine Gluconate Cloth  6 each Topical Daily  . cloNIDine  0.1 mg Oral TID  . doxazosin  1 mg Oral Daily  . feeding supplement (ENSURE ENLIVE)  237 mL Oral TID BM  . hydrALAZINE  100 mg Oral QID  . insulin aspart  0-9 Units Subcutaneous TID PC & HS  . labetalol  100 mg Oral BID  . levothyroxine  50 mcg Oral Q0600  . loratadine  10 mg Oral Daily  . multivitamin with minerals  1 tablet Oral Daily  . pantoprazole  40 mg Intravenous Q12H   Continuous Infusions: . lactated ringers 50 mL/hr at 02/04/20 0600  . niCARDipine 1 mg/hr (02/04/20 0600)     Anti-infectives (From admission, onward)   None             Family Communication/Anticipated D/C date and plan/Code Status   DVT prophylaxis: SCDs Start: 01/31/20 1948  Code Status: DNR    Family Communication: None Disposition Plan:    Status is: Inpatient  Remains inpatient appropriate because:IV treatments appropriate due to intensity of illness or inability to take PO and Inpatient level of care appropriate due to severity of illness   Dispo: The patient is from: Home              Anticipated d/c is to: SNF              Anticipated d/c date is: 2 days              Patient currently is not medically stable to d/c.           Subjective:   Overnight events noted. Nicardipine infusion was restarted last night because of uncontrolled blood pressure. No complaints.   Objective:     Vitals:   02/04/20 0745 02/04/20 0800 02/04/20 0815 02/04/20 0830  BP: (!) 132/59 (!) 138/57 (!) 147/62 (!) 153/66  Pulse: (!) 57 65 62 65  Resp: 12 17 17 13   Temp:    (!) 97.3 F (36.3 C)  TempSrc:    Oral  SpO2: 98% 97% 99% 98%  Weight:      Height:       No data found.   Intake/Output Summary (Last 24 hours) at 02/04/2020 1105 Last data filed at 02/04/2020 0600 Gross per 24 hour  Intake 1587.35 ml  Output 950 ml  Net 637.35 ml   Filed Weights   02/01/20 0645 02/03/20 0600 02/04/20 0600  Weight: 36.4 kg 37.3 kg 37.1 kg    Exam:  GEN: No acute distress SKIN: Stage II sacral decubitus ulcer EYES: No pallor or icterus ENT: MMM CV: Regular rate and rhythm PULM: No wheezing or rales. ABD: soft, ND, NT, +BS CNS: AAO x 3, non focal EXT: No edema.  Mild b/l leg tenderness (increased sensitivity to touch)   Data Reviewed:   I have personally reviewed following labs and imaging studies:  Labs: Labs show the following:   Basic Metabolic Panel: Recent Labs  Lab 01/31/20 1738 01/31/20 1738 02/01/20 0224 02/01/20 0224 02/02/20 0411 02/02/20 0411 02/03/20 0519 02/04/20 0454  NA 144  --  145  --  143  --  142 141  K 4.7   < > 4.6   < > 4.8   < > 4.4 4.2  CL 117*  --  117*  --  114*  --  111 110  CO2 21*  --  20*  --  21*  --  21* 22  GLUCOSE 140*  --  144*  --  107*  --  111* 117*  BUN 73*  --  72*  --  86*  --  91* 82*  CREATININE 2.54*  --  2.50*  --  2.85*  --  2.97* 2.87*  CALCIUM 9.6  --  9.6  --  8.6*  --  8.3* 8.0*  MG  --   --  2.1  --   --   --   --   --    < > = values in this interval not displayed.   GFR Estimated Creatinine Clearance: 6.3 mL/min (A) (by C-G formula based on SCr of 2.87 mg/dL (H)). Liver Function Tests: Recent Labs  Lab 01/31/20 1738  AST 24  ALT 11  ALKPHOS 39  BILITOT 0.8  PROT 6.7  ALBUMIN 3.1*   No results for input(s): LIPASE, AMYLASE in the last 168 hours. No results  for input(s): AMMONIA in the last 168  hours. Coagulation profile No results for input(s): INR, PROTIME in the last 168 hours.  CBC: Recent Labs  Lab 01/31/20 1738 01/31/20 2033 02/01/20 0224 02/01/20 0844 02/01/20 1419 02/02/20 0411 02/03/20 0519  WBC 4.8  --  4.8  --   --  4.7 5.0  NEUTROABS 3.1  --   --   --   --  4.0 3.8  HGB 5.9*   < > 9.3* 10.7* 9.2* 8.4* 8.0*  HCT 18.6*   < > 28.7* 31.7* 27.2* 24.6* 23.9*  MCV 103.3*  --  97.6  --   --  92.8 96.0  PLT 188  --  199  --   --  176 173   < > = values in this interval not displayed.   Cardiac Enzymes: No results for input(s): CKTOTAL, CKMB, CKMBINDEX, TROPONINI in the last 168 hours. BNP (last 3 results) No results for input(s): PROBNP in the last 8760 hours. CBG: Recent Labs  Lab 02/02/20 2139 02/03/20 0733 02/03/20 1204 02/03/20 2123 02/04/20 0807  GLUCAP 168* 105* 104* 139* 110*   D-Dimer: No results for input(s): DDIMER in the last 72 hours. Hgb A1c: No results for input(s): HGBA1C in the last 72 hours. Lipid Profile: No results for input(s): CHOL, HDL, LDLCALC, TRIG, CHOLHDL, LDLDIRECT in the last 72 hours. Thyroid function studies: No results for input(s): TSH, T4TOTAL, T3FREE, THYROIDAB in the last 72 hours.  Invalid input(s): FREET3 Anemia work up: No results for input(s): VITAMINB12, FOLATE, FERRITIN, TIBC, IRON, RETICCTPCT in the last 72 hours. Sepsis Labs: Recent Labs  Lab 01/31/20 1738 02/01/20 0224 02/02/20 0411 02/03/20 0519  WBC 4.8 4.8 4.7 5.0    Microbiology Recent Results (from the past 240 hour(s))  SARS Coronavirus 2 by RT PCR (hospital order, performed in [facility] hospital lab) Nasopharyngeal Nasopharyngeal Swab     Status: None   Collection Time: 01/31/20  7:11 PM   Specimen: Nasopharyngeal Swab  Result Value Ref Range Status   SARS Coronavirus 2 NEGATIVE NEGATIVE Final    Comment: (NOTE) SARS-CoV-2 target nucleic acids are NOT DETECTED.  The SARS-CoV-2 RNA is generally detectable in upper and  lower respiratory specimens during the acute phase of infection. The lowest concentration of SARS-CoV-2 viral copies this assay can detect is 250 copies / mL. A negative result does not preclude SARS-CoV-2 infection and should not be used as the sole basis for treatment or other patient management decisions.  A negative result may occur with improper specimen collection / handling, submission of specimen other than nasopharyngeal swab, presence of viral mutation(s) within the areas targeted by this assay, and inadequate number of viral copies (<250 copies / mL). A negative result must be combined with clinical observations, patient history, and epidemiological information.  Fact Sheet for Patients:   StrictlyIdeas.no  Fact Sheet for Healthcare Providers: BankingDealers.co.za  This test is not yet approved or  cleared by the [place] FDA and has been authorized for detection and/or diagnosis of SARS-CoV-2 by FDA under an Emergency Use Authorization (EUA).  This EUA will remain in effect (meaning this test can be used) for the duration of the COVID-19 declaration under Section 564(b)(1) of the Act, 21 U.S.C. section 360bbb-3(b)(1), unless the authorization is terminated or revoked sooner.  Performed at [facility], [address] [place],  [id number]     Procedures and diagnostic studies:  No results found.  LOS: 4 days   Brendan Gruwell  [facility]     02/04/2020, 11:05 AM

## 2020-02-04 NOTE — Progress Notes (Signed)
[place] visited pt. as pt. waved [place] into rm.  Pt. requested [place] help adjust her blankets; expressed gratitude for help.  No further needs at this time.  Approx. 1hr. later, [place] returned to ask if pt.'s dtr. who arrived at bedside after first visit would be willing to serve as AD witness; dtr. not comfortable with this.  CH remains available as needed.    02/04/20 1000  Clinical Encounter Type  Visited With Patient  Visit Type Follow-up;Psychological support;Social support;Critical Care  Spiritual Encounters  Spiritual Needs Emotional

## 2020-02-05 LAB — BASIC METABOLIC PANEL
Anion gap: 8 (ref 5–15)
BUN: 86 mg/dL — ABNORMAL HIGH (ref 8–23)
CO2: 23 mmol/L (ref 22–32)
Calcium: 7.8 mg/dL — ABNORMAL LOW (ref 8.9–10.3)
Chloride: 110 mmol/L (ref 98–111)
Creatinine, Ser: 2.73 mg/dL — ABNORMAL HIGH (ref 0.44–1.00)
GFR calc Af Amer: 16 mL/min — ABNORMAL LOW (ref >60)
GFR calc non Af Amer: 14 mL/min — ABNORMAL LOW (ref >60)
Glucose, Bld: 63 mg/dL — ABNORMAL LOW (ref 70–99)
Potassium: 4 mmol/L (ref 3.5–5.1)
Sodium: 141 mmol/L (ref 135–145)

## 2020-02-05 LAB — CBC WITH DIFFERENTIAL/PLATELET
Abs Immature Granulocytes: 0.01 10*3/uL (ref 0.00–0.07)
Basophils Absolute: 0 10*3/uL (ref 0.0–0.1)
Basophils Relative: 0 %
Eosinophils Absolute: 0.1 10*3/uL (ref 0.0–0.5)
Eosinophils Relative: 2 %
HCT: 25.3 % — ABNORMAL LOW (ref 36.0–46.0)
Hemoglobin: 8.5 g/dL — ABNORMAL LOW (ref 12.0–15.0)
Immature Granulocytes: 0 %
Lymphocytes Relative: 14 %
Lymphs Abs: 0.6 10*3/uL — ABNORMAL LOW (ref 0.7–4.0)
MCH: 32 pg (ref 26.0–34.0)
MCHC: 33.6 g/dL (ref 30.0–36.0)
MCV: 95.1 fL (ref 80.0–100.0)
Monocytes Absolute: 0.3 10*3/uL (ref 0.1–1.0)
Monocytes Relative: 6 %
Neutro Abs: 3.5 10*3/uL (ref 1.7–7.7)
Neutrophils Relative %: 78 %
Platelets: 185 10*3/uL (ref 150–400)
RBC: 2.66 MIL/uL — ABNORMAL LOW (ref 3.87–5.11)
RDW: 17.2 % — ABNORMAL HIGH (ref 11.5–15.5)
WBC: 4.4 10*3/uL (ref 4.0–10.5)
nRBC: 0 % (ref 0.0–0.2)

## 2020-02-05 LAB — GLUCOSE, CAPILLARY
Glucose-Capillary: 88 mg/dL (ref 70–99)
Glucose-Capillary: 138 mg/dL — ABNORMAL HIGH (ref 70–99)
Glucose-Capillary: 167 mg/dL — ABNORMAL HIGH (ref 70–99)
Glucose-Capillary: 214 mg/dL — ABNORMAL HIGH (ref 70–99)

## 2020-02-05 MED ORDER — MINOXIDIL 2.5 MG PO TABS
5.0000 mg | ORAL_TABLET | Freq: Every day | ORAL | Status: DC
  Administered 2020-02-05 – 2020-02-09 (×5): 5 mg via ORAL
  Filled 2020-02-05 (×5): qty 2

## 2020-02-05 MED ORDER — EPOETIN ALFA 4000 UNIT/ML IJ SOLN
3000.0000 [IU] | INTRAMUSCULAR | Status: DC
  Filled 2020-02-05: qty 1

## 2020-02-05 MED ORDER — NIFEDIPINE ER 60 MG PO TB24
60.0000 mg | ORAL_TABLET | Freq: Every day | ORAL | Status: DC
  Administered 2020-02-05 – 2020-02-09 (×5): 60 mg via ORAL
  Filled 2020-02-05 (×5): qty 1

## 2020-02-05 MED ORDER — HYDRALAZINE HCL 50 MG PO TABS
100.0000 mg | ORAL_TABLET | Freq: Three times a day (TID) | ORAL | Status: DC
  Administered 2020-02-05 – 2020-02-09 (×13): 100 mg via ORAL
  Filled 2020-02-05 (×12): qty 2

## 2020-02-05 NOTE — Progress Notes (Addendum)
Progress Note    Tara MUNSHI  [name]:[id number] DOB: [birth year]  DOA: 01/31/2020 PCP: Einar Pheasant, MD      Brief Narrative:    Medical records reviewed and are as summarized below:  Tara [name] is a 86 y.o. female  with a known history of asthma, hypertension and type 2 diabetes mellitus, who presented to the emergency room with acute onset of generalized weakness, cough, exertional shortness of breath.      Assessment/Plan:   Active Problems:   Diabetes mellitus with peripheral vascular disease (HCC)   CKD (chronic kidney disease) stage 4, GFR 15-29 ml/min (HCC)   GI bleeding   Pressure injury of skin   AKI (acute kidney injury) (HCC)    Positive heme stools with severe anemia in a patient with history of anemia of chronic disease S/p transfusion with 1 unit of packed red blood cells on 6/23.  H&H is stable.  Patient was evaluated by gastroenterologist and conservative management was recommended.  Patient's daughter does not want any invasive procedures.  Continue Protonix  Hypertensive emergency She is off of Cardene drip.  Minoxidil was added, amlodipine was switched to nifedipine and hydralazine was decreased back to 100 mg every 8 hours by nephrologist on 02/05/2020.  Doxazosin was added on 02/02/2020 for adequate BP control. .  Acute new onset likely diastolic CHF with pulmonary edema.  This could be secondary to hypertensive emergency and high-output failure. Monitor daily weights, BMP and urine output.  Patient has been evaluated by cardiologist.  2D echo showed EF estimated >95%, grade 1 diastolic dysfunction, moderately elevated pulmonary artery systolic pressure.  AKI on Stage IV CKD -Creatinine is trending down.  Discontinue IV fluids..  Monitor BMP. Follow up with nephrologist to assist with management.  Hypothyroidism. Continue Synthroid  Type 2 diabetes mellitus. NovoLog as needed for hyperglycemia  History of stroke/chronic lacunar  infarcts Hold aspirin for now.  Sinus bradycardia Asymptomatic.   Generalized weakness PT recommend discharge to SNF.  Follow-up with social worker to assist with disposition.  Stage II sacral decubitus ulcer  Present on admission.  Turn patient every 2 hours.  Continue local wound care.  Body mass index is 15.97 kg/m.  Diet Order            DIET DYS 2 Room service appropriate? Yes; Fluid consistency: Thin  Diet effective now                 Transfer out of stepdown unit to medical floor with cardiac monitoring.      Medications:   . Chlorhexidine Gluconate Cloth  6 each Topical Daily  . cloNIDine  0.1 mg Oral TID  . doxazosin  1 mg Oral Daily  . epoetin (EPOGEN/PROCRIT) injection  3,000 Units Subcutaneous Q14 Days  . feeding supplement (ENSURE ENLIVE)  237 mL Oral TID BM  . hydrALAZINE  100 mg Oral Q8H  . insulin aspart  0-9 Units Subcutaneous TID PC & HS  . labetalol  100 mg Oral BID  . levothyroxine  50 mcg Oral Q0600  . loratadine  10 mg Oral Daily  . minoxidil  5 mg Oral Daily  . multivitamin with minerals  1 tablet Oral Daily  . NIFEdipine  60 mg Oral Daily  . pantoprazole  40 mg Intravenous Q12H   Continuous Infusions:    Anti-infectives (From admission, onward)   None             Family Communication/Anticipated D/C date  and plan/Code Status   DVT prophylaxis: SCDs Start: 01/31/20 1948     Code Status: DNR    Family Communication: None Disposition Plan:    Status is: Inpatient  Remains inpatient appropriate because:IV treatments appropriate due to intensity of illness or inability to take PO and Inpatient level of care appropriate due to severity of illness   Dispo: The patient is from: Home              Anticipated d/c is to: SNF              Anticipated d/c date is: 2 days              Patient currently is not medically stable to d/c.           Subjective:   Overnight events noted.  She is off of Cardene drip.   She has no complaints.  Objective:    Vitals:   02/05/20 0530 02/05/20 0600 02/05/20 0800 02/05/20 1200  BP: (!) 181/68 (!) 160/67    Pulse: (!) 57 (!) 54    Resp: 19 14    Temp:   97.8 F (36.6 C) (!) 97.5 F (36.4 C)  TempSrc:   Oral Oral  SpO2: 99% 99%    Weight:      Height:       No data found.   Intake/Output Summary (Last 24 hours) at 02/05/2020 1525 Last data filed at 02/05/2020 0518 Gross per 24 hour  Intake 49.97 ml  Output 375 ml  Net -325.03 ml   Filed Weights   02/01/20 0645 02/03/20 0600 02/04/20 0600  Weight: 36.4 kg 37.3 kg 37.1 kg    Exam:  GEN: No acute distress SKIN: Stage II sacral decubitus ulcer EYES: PERRL ENT: MMM CV: Regular rate and rhythm PULM: No wheezing or rales. ABD: soft, ND, NT, +BS CNS: AAO x 2 (person and place), non focal EXT: No edema.  Mild b/l leg tenderness (increased sensitivity to touch)   Data Reviewed:   I have personally reviewed following labs and imaging studies:  Labs: Labs show the following:   Basic Metabolic Panel: Recent Labs  Lab 02/01/20 0224 02/01/20 0224 02/02/20 0411 02/02/20 0411 02/03/20 0519 02/03/20 0519 02/04/20 0454 02/05/20 0311  NA 145  --  143  --  142  --  141 141  K 4.6   < > 4.8   < > 4.4   < > 4.2 4.0  CL 117*  --  114*  --  111  --  110 110  CO2 20*  --  21*  --  21*  --  22 23  GLUCOSE 144*  --  107*  --  111*  --  117* 63*  BUN 72*  --  86*  --  91*  --  82* 86*  CREATININE 2.50*  --  2.85*  --  2.97*  --  2.87* 2.73*  CALCIUM 9.6  --  8.6*  --  8.3*  --  8.0* 7.8*  MG 2.1  --   --   --   --   --   --   --    < > = values in this interval not displayed.   GFR Estimated Creatinine Clearance: 6.6 mL/min (A) (by C-G formula based on SCr of 2.73 mg/dL (H)). Liver Function Tests: Recent Labs  Lab 01/31/20 1738  AST 24  ALT 11  ALKPHOS 39  BILITOT 0.8  PROT 6.7  ALBUMIN 3.1*  No results for input(s): LIPASE, AMYLASE in the last 168 hours. No results for input(s):  AMMONIA in the last 168 hours. Coagulation profile No results for input(s): INR, PROTIME in the last 168 hours.  CBC: Recent Labs  Lab 01/31/20 1738 01/31/20 2033 02/01/20 0224 02/01/20 0224 02/01/20 0844 02/01/20 1419 02/02/20 0411 02/03/20 0519 02/05/20 0311  WBC 4.8  --  4.8  --   --   --  4.7 5.0 4.4  NEUTROABS 3.1  --   --   --   --   --  4.0 3.8 3.5  HGB 5.9*   < > 9.3*   < > 10.7* 9.2* 8.4* 8.0* 8.5*  HCT 18.6*   < > 28.7*   < > 31.7* 27.2* 24.6* 23.9* 25.3*  MCV 103.3*  --  97.6  --   --   --  92.8 96.0 95.1  PLT 188  --  199  --   --   --  176 173 185   < > = values in this interval not displayed.   Cardiac Enzymes: No results for input(s): CKTOTAL, CKMB, CKMBINDEX, TROPONINI in the last 168 hours. BNP (last 3 results) No results for input(s): PROBNP in the last 8760 hours. CBG: Recent Labs  Lab 02/04/20 1206 02/04/20 1628 02/04/20 2110 02/05/20 0538 02/05/20 1112  GLUCAP 153* 132* 139* 88 138*   D-Dimer: No results for input(s): DDIMER in the last 72 hours. Hgb A1c: No results for input(s): HGBA1C in the last 72 hours. Lipid Profile: No results for input(s): CHOL, HDL, LDLCALC, TRIG, CHOLHDL, LDLDIRECT in the last 72 hours. Thyroid function studies: No results for input(s): TSH, T4TOTAL, T3FREE, THYROIDAB in the last 72 hours.  Invalid input(s): FREET3 Anemia work up: Recent Labs    02/04/20 1233  VITAMINB12 358  FOLATE 12.1  FERRITIN 132  TIBC 213*  IRON 52  RETICCTPCT 2.2   Sepsis Labs: Recent Labs  Lab 02/01/20 0224 02/02/20 0411 02/03/20 0519 02/05/20 0311  WBC 4.8 4.7 5.0 4.4    Microbiology Recent Results (from the past 240 hour(s))  SARS Coronavirus 2 by RT PCR (hospital order, performed in [facility] hospital lab) Nasopharyngeal Nasopharyngeal Swab     Status: None   Collection Time: 01/31/20  7:11 PM   Specimen: Nasopharyngeal Swab  Result Value Ref Range Status   SARS Coronavirus 2 NEGATIVE NEGATIVE Final    Comment:  (NOTE) SARS-CoV-2 target nucleic acids are NOT DETECTED.  The SARS-CoV-2 RNA is generally detectable in upper and lower respiratory specimens during the acute phase of infection. The lowest concentration of SARS-CoV-2 viral copies this assay can detect is 250 copies / mL. A negative result does not preclude SARS-CoV-2 infection and should not be used as the sole basis for treatment or other patient management decisions.  A negative result may occur with improper specimen collection / handling, submission of specimen other than nasopharyngeal swab, presence of viral mutation(s) within the areas targeted by this assay, and inadequate number of viral copies (<250 copies / mL). A negative result must be combined with clinical observations, patient history, and epidemiological information.  Fact Sheet for Patients:   StrictlyIdeas.no  Fact Sheet for Healthcare Providers: BankingDealers.co.za  This test is not yet approved or  cleared by the [place] FDA and has been authorized for detection and/or diagnosis of SARS-CoV-2 by FDA under an Emergency Use Authorization (EUA).  This EUA will remain in effect (meaning this test can be used) for the duration of  the COVID-19 declaration under Section 564(b)(1) of the Act, 21 U.S.C. section 360bbb-3(b)(1), unless the authorization is terminated or revoked sooner.  Performed at [facility], [address] [place], [place] [address]     Procedures and diagnostic studies:  No results found.             LOS: 5 days   Azrielle Springsteen  [facility]     02/05/2020, 3:25 PM

## 2020-02-05 NOTE — Plan of Care (Signed)
Patient admitted with GI Bleed 01/31/2020, 1 unit pRBC's transfused this admission. Patient  transferred to ICU on 02/01/2020 with hypertensive crises requiring a Cardene drip. Hypertension now being controlled with PO medications and PRN IV medication.

## 2020-02-05 NOTE — Progress Notes (Signed)
ADIN LARICCIA  MRN: [id number]  DOB/AGE: 84/26/1919 84 y.o.  Primary Care Physician:Scott, Randell Patient, MD  Admit date: 01/31/2020  Chief Complaint:  Chief Complaint  Patient presents with  . Weakness    S-Pt presented on  01/31/2020 with  Chief Complaint  Patient presents with  . Weakness  .    Pt today feels better. Patient offers no specific complaints   Medications . amLODipine  10 mg Oral Daily  . Chlorhexidine Gluconate Cloth  6 each Topical Daily  . cloNIDine  0.1 mg Oral TID  . doxazosin  1 mg Oral Daily  . feeding supplement (ENSURE ENLIVE)  237 mL Oral TID BM  . hydrALAZINE  100 mg Oral QID  . insulin aspart  0-9 Units Subcutaneous TID PC & HS  . labetalol  100 mg Oral BID  . levothyroxine  50 mcg Oral Q0600  . loratadine  10 mg Oral Daily  . multivitamin with minerals  1 tablet Oral Daily  . pantoprazole  40 mg Intravenous Q12H         MLY:YTKPT from the symptoms mentioned above,there are no other symptoms referable to all systems reviewed.  Physical Exam: Vital signs in last 24 hours: Temp:  [96.9 F (36.1 C)-97.6 F (36.4 C)] 97.5 F (36.4 C) (06/27 0400) Pulse Rate:  [54-66] 54 (06/27 0600) Resp:  [9-25] 14 (06/27 0600) BP: (131-196)/(50-84) 160/67 (06/27 0600) SpO2:  [97 %-100 %] 99 % (06/27 0600) Weight change:  Last BM Date: 02/05/20  Intake/Output from previous day: 06/26 0701 - 06/27 0700 In: 705 [P.O.:120; I.V.:585] Out: 375 [Urine:375] Total I/O In: -  Out: 375 [Urine:375]   Physical Exam: General- pt is awake,alert, oriented to time place and person Resp- No acute REsp distress, CTA B/L NO Rhonchi CVS- S1S2 regular in rate and rhythm GIT- BS+, soft, NT, ND EXT- NO LE Edema, Cyanosis   Lab Results: CBC Recent Labs    02/03/20 0519 02/05/20 0311  WBC 5.0 4.4  HGB 8.0* 8.5*  HCT 23.9* 25.3*  PLT 173 185   Results for KINGSLEY, HERANDEZ (MRN [id number]) as of 02/05/2020 06:41  Ref. Range 02/04/2020 12:33  Iron Latest Ref  Range: 28 - 170 ug/dL 52  UIBC Latest Units: ug/dL 161  TIBC Latest Ref Range: 250 - 450 ug/dL 213 (L)  Saturation Ratios Latest Ref Range: 10.4 - 31.8 % 24  Ferritin Latest Ref Range: 11 - 307 ng/mL 132  Folate Latest Ref Range: >5.9 ng/mL 12.1   BMET Recent Labs    02/04/20 0454 02/05/20 0311  NA 141 141  K 4.2 4.0  CL 110 110  CO2 22 23  GLUCOSE 117* 63*  BUN 82* 86*  CREATININE 2.87* 2.73*  CALCIUM 8.0* 7.8*    Creatinine trend 2021 2.5==>2.7--2.9 2020 1.0--2.4 2019 0.9--1.54     MICRO Recent Results (from the past 240 hour(s))  SARS Coronavirus 2 by RT PCR (hospital order, performed in [facility] hospital lab) Nasopharyngeal Nasopharyngeal Swab     Status: None   Collection Time: 01/31/20  7:11 PM   Specimen: Nasopharyngeal Swab  Result Value Ref Range Status   SARS Coronavirus 2 NEGATIVE NEGATIVE Final    Comment: (NOTE) SARS-CoV-2 target nucleic acids are NOT DETECTED.  The SARS-CoV-2 RNA is generally detectable in upper and lower respiratory specimens during the acute phase of infection. The lowest concentration of SARS-CoV-2 viral copies this assay can detect is 250 copies / mL. A negative result does not preclude SARS-CoV-2  infection and should not be used as the sole basis for treatment or other patient management decisions.  A negative result may occur with improper specimen collection / handling, submission of specimen other than nasopharyngeal swab, presence of viral mutation(s) within the areas targeted by this assay, and inadequate number of viral copies (<250 copies / mL). A negative result must be combined with clinical observations, patient history, and epidemiological information.  Fact Sheet for Patients:   StrictlyIdeas.no  Fact Sheet for Healthcare Providers: BankingDealers.co.za  This test is not yet approved or  cleared by the [place] FDA and has been authorized for detection  and/or diagnosis of SARS-CoV-2 by FDA under an Emergency Use Authorization (EUA).  This EUA will remain in effect (meaning this test can be used) for the duration of the COVID-19 declaration under Section 564(b)(1) of the Act, 21 U.S.C. section 360bbb-3(b)(1), unless the authorization is terminated or revoked sooner.  Performed at [facility], [address], [place], [place] [address]       Lab Results  Component Value Date   CALCIUM 7.8 (L) 02/05/2020   PHOS 3.3 05/19/2018       2D echo done on June 2021 showed 1. Asymetricle septal hypertophy with IHSS. Left ventricular ejection  fraction, by estimation, is >75%. The left ventricle has hyperdynamic  function. The left ventricle has no regional wall motion abnormalities.  There is severe asymmetric left  ventricular hypertrophy of the basal-septal segment. Left ventricular  diastolic parameters are consistent with Grade I diastolic dysfunction  (impaired relaxation).       Impression:  Patient is a 86 year old African-American female with a past medical history of diabetes mellitus, hypertension, asthma, peripheral vascular disease, CKD stage IV, lower extremity edema who was admitted with chief complaint of heme positive stools, anemia and hypertensive urgency   1)Renal  AKI secondary to ATN               AKI on CKD               CKD stage 4.               CKD since 2019               CKD secondary to diabetes mellitus.                Possible contribution from hypertension/age so she declined as patient is 86 years old                Progression of CKD marked with multiple AKI is                             Patient creatinine is stable  2)HTN admitted with HTN emergency   Blood pressure remains uncontrolled Patient was earlier on nicardipine drip and blood pressure was better As patient developing drip was titrated off blood pressure started rising We will DC patient's amlodipine Start patient  on nifedipine Patient is on hydralazine 20 mg every 6 hours We will change to every 8 hours We will add minoxidil 5 mg p.o. daily Cannot increase the dose of clonidine or labetalol as patient is bradycardic with pulse less than 60.  Medication-  On Calcium Channel Blockers. On Alpha and beta Blockers. On alpha blockers On Vasodilators. On [employer].   3)Anemia of chronic disease complicated by heme positive stools  HGb is not at goal (9--11)  Patient  was seen by GI and conservative management was recommended, patient daughter also does not want any invasive measures  We will start patient on Epogen  4) secondary hyperparathyroidism -CKD Mineral-Bone Disorder   Secondary Hyperparathyroidism  Present. Phosphorus at goal. Phosphorus was higher in the past now it is much better  5) diastolic CHF Patient is now better  6) electrolytes   sodium Normonatremic   potassium Normokalemic    7)Acid base Co2 at goal     Plan:   We will start patient on Epogen Blood pressure remains uncontrolled Patient was earlier on nicardipine drip and blood pressure was better As patient developing drip was titrated off blood pressure started rising We will DC patient's amlodipine Start patient on nifedipine Patient is on hydralazine 20 mg every 6 hours We will change to every 8 hours We will add minoxidil 5 mg p.o. daily Cannot increase the dose of clonidine or labetalol as patient is bradycardic with pulse less than 60.    Linette Gunderson s Theador Hawthorne 02/05/2020, 6:41 AM

## 2020-02-06 LAB — BASIC METABOLIC PANEL
Anion gap: 11 (ref 5–15)
BUN: 86 mg/dL — ABNORMAL HIGH (ref 8–23)
CO2: 21 mmol/L — ABNORMAL LOW (ref 22–32)
Calcium: 8 mg/dL — ABNORMAL LOW (ref 8.9–10.3)
Chloride: 107 mmol/L (ref 98–111)
Creatinine, Ser: 2.65 mg/dL — ABNORMAL HIGH (ref 0.44–1.00)
GFR calc Af Amer: 17 mL/min — ABNORMAL LOW (ref >60)
GFR calc non Af Amer: 14 mL/min — ABNORMAL LOW (ref >60)
Glucose, Bld: 115 mg/dL — ABNORMAL HIGH (ref 70–99)
Potassium: 4.5 mmol/L (ref 3.5–5.1)
Sodium: 139 mmol/L (ref 135–145)

## 2020-02-06 LAB — GLUCOSE, CAPILLARY
Glucose-Capillary: 119 mg/dL — ABNORMAL HIGH (ref 70–99)
Glucose-Capillary: 153 mg/dL — ABNORMAL HIGH (ref 70–99)
Glucose-Capillary: 156 mg/dL — ABNORMAL HIGH (ref 70–99)
Glucose-Capillary: 166 mg/dL — ABNORMAL HIGH (ref 70–99)

## 2020-02-06 LAB — CBC WITH DIFFERENTIAL/PLATELET
Abs Immature Granulocytes: 0.03 10*3/uL (ref 0.00–0.07)
Basophils Absolute: 0 10*3/uL (ref 0.0–0.1)
Basophils Relative: 0 %
Eosinophils Absolute: 0.1 10*3/uL (ref 0.0–0.5)
Eosinophils Relative: 1 %
HCT: 27.1 % — ABNORMAL LOW (ref 36.0–46.0)
Hemoglobin: 9.1 g/dL — ABNORMAL LOW (ref 12.0–15.0)
Immature Granulocytes: 1 %
Lymphocytes Relative: 13 %
Lymphs Abs: 0.8 10*3/uL (ref 0.7–4.0)
MCH: 32 pg (ref 26.0–34.0)
MCHC: 33.6 g/dL (ref 30.0–36.0)
MCV: 95.4 fL (ref 80.0–100.0)
Monocytes Absolute: 0.3 10*3/uL (ref 0.1–1.0)
Monocytes Relative: 5 %
Neutro Abs: 4.6 10*3/uL (ref 1.7–7.7)
Neutrophils Relative %: 80 %
Platelets: 220 10*3/uL (ref 150–400)
RBC: 2.84 MIL/uL — ABNORMAL LOW (ref 3.87–5.11)
RDW: 17 % — ABNORMAL HIGH (ref 11.5–15.5)
WBC: 5.8 10*3/uL (ref 4.0–10.5)
nRBC: 0 % (ref 0.0–0.2)

## 2020-02-06 NOTE — Progress Notes (Signed)
[facility]  ROUNDING NOTE   Subjective:   Daughter at bedside. Patient seated in chair. Patient is hard of hearing and must be spoken to loudly.   Objective:  Vital signs in last 24 hours:  Temp:  [97.5 F (36.4 C)-98.6 F (37 C)] 97.7 F (36.5 C) (06/28 1123) Pulse Rate:  [62-79] 63 (06/28 1123) Resp:  [15-17] 15 (06/28 1123) BP: (140-199)/(60-81) 168/67 (06/28 1123) SpO2:  [97 %-99 %] 98 % (06/28 1123) Weight:  [37.3 kg-37.4 kg] 37.4 kg (06/28 0510)  Weight change:  Filed Weights   02/04/20 0600 02/05/20 1813 02/06/20 0510  Weight: 37.1 kg 37.3 kg 37.4 kg    Intake/Output: I/O last 3 completed shifts: In: 75 [P.O.:237] Out: 575 [Urine:575]   Intake/Output this shift:  Total I/O In: 360 [P.O.:360] Out: -   Physical Exam: General: NAD,   Head: Normocephalic, atraumatic. Moist oral mucosal membranes  Eyes: Anicteric, PERRL  Neck: Supple, trachea midline  Lungs:  Clear to auscultation  Heart: Regular rate and rhythm  Abdomen:  +distended   Extremities:  no peripheral edema.  Neurologic: Nonfocal, moving all four extremities  Skin: No lesions        Basic Metabolic Panel: Recent Labs  Lab 02/01/20 0224 02/01/20 0224 02/02/20 0411 02/02/20 0411 02/03/20 0519 02/03/20 0519 02/04/20 0454 02/05/20 0311 02/06/20 0852  NA 145   < > 143  --  142  --  141 141 139  K 4.6   < > 4.8  --  4.4  --  4.2 4.0 4.5  CL 117*   < > 114*  --  111  --  110 110 107  CO2 20*   < > 21*  --  21*  --  22 23 21*  GLUCOSE 144*   < > 107*  --  111*  --  117* 63* 115*  BUN 72*   < > 86*  --  91*  --  82* 86* 86*  CREATININE 2.50*   < > 2.85*  --  2.97*  --  2.87* 2.73* 2.65*  CALCIUM 9.6   < > 8.6*   < > 8.3*   < > 8.0* 7.8* 8.0*  MG 2.1  --   --   --   --   --   --   --   --    < > = values in this interval not displayed.    Liver Function Tests: Recent Labs  Lab 01/31/20 1738  AST 24  ALT 11  ALKPHOS 39  BILITOT 0.8  PROT 6.7  ALBUMIN 3.1*   No  results for input(s): LIPASE, AMYLASE in the last 168 hours. No results for input(s): AMMONIA in the last 168 hours.  CBC: Recent Labs  Lab 01/31/20 1738 01/31/20 2033 02/01/20 0224 02/01/20 0844 02/01/20 1419 02/02/20 0411 02/03/20 0519 02/05/20 0311 02/06/20 0852  WBC 4.8   < > 4.8  --   --  4.7 5.0 4.4 5.8  NEUTROABS 3.1  --   --   --   --  4.0 3.8 3.5 4.6  HGB 5.9*   < > 9.3*   < > 9.2* 8.4* 8.0* 8.5* 9.1*  HCT 18.6*   < > 28.7*   < > 27.2* 24.6* 23.9* 25.3* 27.1*  MCV 103.3*   < > 97.6  --   --  92.8 96.0 95.1 95.4  PLT 188   < > 199  --   --  176 173 185 220   < > =  values in this interval not displayed.    Cardiac Enzymes: No results for input(s): CKTOTAL, CKMB, CKMBINDEX, TROPONINI in the last 168 hours.  BNP: Invalid input(s): POCBNP  CBG: Recent Labs  Lab 02/05/20 1112 02/05/20 1616 02/05/20 2105 02/06/20 0751 02/06/20 1125  GLUCAP 138* 167* 214* 119* 153*    Microbiology: Results for orders placed or performed during the hospital encounter of 01/31/20  SARS Coronavirus 2 by RT PCR (hospital order, performed in [facility] hospital lab) Nasopharyngeal Nasopharyngeal Swab     Status: None   Collection Time: 01/31/20  7:11 PM   Specimen: Nasopharyngeal Swab  Result Value Ref Range Status   SARS Coronavirus 2 NEGATIVE NEGATIVE Final    Comment: (NOTE) SARS-CoV-2 target nucleic acids are NOT DETECTED.  The SARS-CoV-2 RNA is generally detectable in upper and lower respiratory specimens during the acute phase of infection. The lowest concentration of SARS-CoV-2 viral copies this assay can detect is 250 copies / mL. A negative result does not preclude SARS-CoV-2 infection and should not be used as the sole basis for treatment or other patient management decisions.  A negative result may occur with improper specimen collection / handling, submission of specimen other than nasopharyngeal swab, presence of viral mutation(s) within the areas targeted by this  assay, and inadequate number of viral copies (<250 copies / mL). A negative result must be combined with clinical observations, patient history, and epidemiological information.  Fact Sheet for Patients:   StrictlyIdeas.no  Fact Sheet for Healthcare Providers: BankingDealers.co.za  This test is not yet approved or  cleared by the [place] FDA and has been authorized for detection and/or diagnosis of SARS-CoV-2 by FDA under an Emergency Use Authorization (EUA).  This EUA will remain in effect (meaning this test can be used) for the duration of the COVID-19 declaration under Section 564(b)(1) of the Act, 21 U.S.C. section 360bbb-3(b)(1), unless the authorization is terminated or revoked sooner.  Performed at [facility], [address] [place], [place] [address]     Coagulation Studies: No results for input(s): LABPROT, INR in the last 72 hours.  Urinalysis: No results for input(s): COLORURINE, LABSPEC, PHURINE, GLUCOSEU, HGBUR, BILIRUBINUR, KETONESUR, PROTEINUR, UROBILINOGEN, NITRITE, LEUKOCYTESUR in the last 72 hours.  Invalid input(s): APPERANCEUR    Imaging: No results found.   Medications:    . Chlorhexidine Gluconate Cloth  6 each Topical Daily  . cloNIDine  0.1 mg Oral TID  . doxazosin  1 mg Oral Daily  . epoetin (EPOGEN/PROCRIT) injection  3,000 Units Subcutaneous Q14 Days  . feeding supplement (ENSURE ENLIVE)  237 mL Oral TID BM  . hydrALAZINE  100 mg Oral Q8H  . insulin aspart  0-9 Units Subcutaneous TID PC & HS  . labetalol  100 mg Oral BID  . levothyroxine  50 mcg Oral Q0600  . loratadine  10 mg Oral Daily  . minoxidil  5 mg Oral Daily  . multivitamin with minerals  1 tablet Oral Daily  . NIFEdipine  60 mg Oral Daily  . pantoprazole  40 mg Intravenous Q12H   acetaminophen **OR** acetaminophen, labetalol, ondansetron **OR** ondansetron (ZOFRAN) IV, traZODone  Assessment/ Plan:  Ms. SHANTICE MENGER is a 86 y.o. black female with diabetes mellitus, hypertension, asthma, peripheral vascular disease admitted to [facility] 01/31/2020 for GI bleeding [K92.2] Acute pulmonary edema ([place]) [J81.0] Weakness [R53.1] Heme positive stool [R19.5] Anemia, unspecified type [D64.9] Chronic kidney disease, unspecified CKD stage [N18.9]  1. Acute renal failure on chronic kidney disease  stage IV with proteinuria: Baseline creatinine of 2.06, GFR of 19 on 08/24/19.  Acute renal failure secondary to hypertensive urgency.  Chronic kidney disease secondary to hypertension and age.  Creatinine trending down. No indication for dialysis. Holding torsemide.    2. Hypertension: 168/67 hypertensive emergency on admission. Home regimen of amlodipine, clonidine, hydralazine, labetalol Current regimen of clonidine, doxazosin, hydralazine, labetalol, minoxidil, nifedipine.  PRN labetalol.  Allow blood pressure to ride high , keep below SBP <180.   3. Anemia of chronic kidney disease: hemoglobin 9.1.  EPO subcu ordered.     LOS: 6 Shakendra Griffeth 6/28/202112:06 PM

## 2020-02-06 NOTE — Progress Notes (Signed)
Rufina Falco, NP in to see patient due to high BP this am. Patient BP recheck with provider at bedside. Provider stated to report to oncoming nurse to given am medications 1 hour early. Will report to oncoming shift.

## 2020-02-06 NOTE — Progress Notes (Signed)
Patient BP elevated this am and recheck to confirm reading. Ordered PRN Labetolol 20 mg IVP was given to patient.

## 2020-02-06 NOTE — Care Management Important Message (Signed)
Important Message  Patient Details  Name: Tara [name] MRN: [id number] Date of Birth: 05/25/1918   Medicare Important Message Given:  Yes     [name] 02/06/2020, 11:55 AM

## 2020-02-06 NOTE — Progress Notes (Signed)
Progress Note    Tara [name]  QMG:[id number] DOB: 1918-08-01  DOA: 01/31/2020 PCP: Einar Pheasant, MD      Brief Narrative:    Medical records reviewed and are as summarized below:  Tara [name] is a 86 y.o. female  with a known history of asthma, hypertension and type 2 diabetes mellitus, who presented to the emergency room with acute onset of generalized weakness, cough, exertional shortness of breath.  She was admitted to the stepdown unit for severe anemia, occult GI bleeding (positive heme stools), hypertensive emergency acute on chronic diastolic CHF.  She was transfused with 1 unit of packed red blood cells and hemoglobin improved.  She was treated with IV Lasix for acute exacerbation of CHF.  She required IV nicardipine infusion for hypertensive emergency.  She developed AKI so IV Lasix was discontinued and she was treated with IV fluids.  Nephrologist, gastroenterologist and cardiologist were consulted to assist with management.  Her daughter did not want any invasive work-up.  Minoxidil and doxazosin were added to antihypertensive regimen because of uncontrolled blood pressure.  She was evaluated by PT and OT who recommended for discharge to SNF.    Assessment/Plan:   Active Problems:   Diabetes mellitus with peripheral vascular disease (HCC)   CKD (chronic kidney disease) stage 4, GFR 15-29 ml/min (HCC)   GI bleeding   Pressure injury of skin   AKI (acute kidney injury) (HCC)    Positive heme stools with severe anemia in a patient with history of anemia of chronic disease S/p transfusion with 1 unit of packed red blood cells on 6/23.  H&H is stable.  Patient was evaluated by gastroenterologist and conservative management was recommended.  Patient's daughter does not want any invasive procedures.  Continue Protonix  Hypertensive emergency Continue antihypertensives.  Minoxidil was added, amlodipine was switched to nifedipine and hydralazine was decreased back  to 100 mg every 8 hours by nephrologist on 02/05/2020.  Doxazosin was added on 02/02/2020 for adequate BP control. .  Acute new onset likely diastolic CHF with pulmonary edema.  This could be secondary to hypertensive emergency and high-output failure. Monitor daily weights, BMP and urine output.  Patient has been evaluated by cardiologist.  2D echo showed EF estimated >32%, grade 1 diastolic dysfunction, severe asymmetric LVH, moderately elevated pulmonary artery systolic pressure.  AKI on Stage IV CKD -Creatinine is trending down off of IV fluids.  Monitor BMP. Follow up with nephrologist to assist with management.  Hypothyroidism. Continue Synthroid  Type 2 diabetes mellitus. NovoLog as needed for hyperglycemia  History of stroke/chronic lacunar infarcts Hold aspirin for now.  Sinus bradycardia Asymptomatic.   Generalized weakness PT recommend discharge to SNF.  Follow-up with social worker to assist with disposition.  Stage II sacral decubitus ulcer  Present on admission.  Turn patient every 2 hours.  Continue local wound care.  Body mass index is 17.24 kg/m.  Diet Order            DIET DYS 2 Room service appropriate? Yes; Fluid consistency: Thin  Diet effective now                     Medications:   . Chlorhexidine Gluconate Cloth  6 each Topical Daily  . cloNIDine  0.1 mg Oral TID  . doxazosin  1 mg Oral Daily  . epoetin (EPOGEN/PROCRIT) injection  3,000 Units Subcutaneous Q14 Days  . feeding supplement (ENSURE ENLIVE)  237 mL Oral TID  BM  . hydrALAZINE  100 mg Oral Q8H  . insulin aspart  0-9 Units Subcutaneous TID PC & HS  . labetalol  100 mg Oral BID  . levothyroxine  50 mcg Oral Q0600  . loratadine  10 mg Oral Daily  . minoxidil  5 mg Oral Daily  . multivitamin with minerals  1 tablet Oral Daily  . NIFEdipine  60 mg Oral Daily  . pantoprazole  40 mg Intravenous Q12H   Continuous Infusions:    Anti-infectives (From admission, onward)   None              Family Communication/Anticipated D/C date and plan/Code Status   DVT prophylaxis: SCDs Start: 01/31/20 1948     Code Status: DNR    Family Communication: None Disposition Plan:    Status is: Inpatient  Remains inpatient appropriate because:Inpatient level of care appropriate due to severity of illness   Dispo: The patient is from: Home              Anticipated d/c is to: SNF              Anticipated d/c date is: 1 day              Patient currently is not medically stable to d/c.           Subjective:   Overnight events noted.  She has no complaints.  No headache, dizziness, chest pain or shortness of breath.  Objective:    Vitals:   02/06/20 0621 02/06/20 0632 02/06/20 0749 02/06/20 1123  BP: (!) 199/66 (!) 197/70 (!) 188/81 (!) 168/67  Pulse: 62 67 70 63  Resp:   16 15  Temp:   98 F (36.7 C) 97.7 F (36.5 C)  TempSrc:      SpO2: 99% 99% 98% 98%  Weight:      Height:       No data found.   Intake/Output Summary (Last 24 hours) at 02/06/2020 1524 Last data filed at 02/06/2020 0950 Gross per 24 hour  Intake 597 ml  Output 200 ml  Net 397 ml   Filed Weights   02/04/20 0600 02/05/20 1813 02/06/20 0510  Weight: 37.1 kg 37.3 kg 37.4 kg    Exam:  GEN: No acute distress SKIN: Stage II sacral decubitus ulcer EYES: No pallor or icterus ENT: MMM CV: Regular rate and rhythm PULM: Clear to auscultation bilaterally ABD: soft, ND, NT, +BS CNS: AAO x 2 (person and place), non focal EXT: No edema.  Mild b/l leg tenderness (increased sensitivity to touch)   Data Reviewed:   I have personally reviewed following labs and imaging studies:  Labs: Labs show the following:   Basic Metabolic Panel: Recent Labs  Lab 02/01/20 0224 02/01/20 0224 02/02/20 0411 02/02/20 0411 02/03/20 0519 02/03/20 0519 02/04/20 0454 02/04/20 0454 02/05/20 0311 02/06/20 0852  NA 145   < > 143  --  142  --  141  --  141 139  K 4.6   < > 4.8   <  > 4.4   < > 4.2   < > 4.0 4.5  CL 117*   < > 114*  --  111  --  110  --  110 107  CO2 20*   < > 21*  --  21*  --  22  --  23 21*  GLUCOSE 144*   < > 107*  --  111*  --  117*  --  63*  115*  BUN 72*   < > 86*  --  91*  --  82*  --  86* 86*  CREATININE 2.50*   < > 2.85*  --  2.97*  --  2.87*  --  2.73* 2.65*  CALCIUM 9.6   < > 8.6*  --  8.3*  --  8.0*  --  7.8* 8.0*  MG 2.1  --   --   --   --   --   --   --   --   --    < > = values in this interval not displayed.   GFR Estimated Creatinine Clearance: 6.8 mL/min (A) (by C-G formula based on SCr of 2.65 mg/dL (H)). Liver Function Tests: Recent Labs  Lab 01/31/20 1738  AST 24  ALT 11  ALKPHOS 39  BILITOT 0.8  PROT 6.7  ALBUMIN 3.1*   No results for input(s): LIPASE, AMYLASE in the last 168 hours. No results for input(s): AMMONIA in the last 168 hours. Coagulation profile No results for input(s): INR, PROTIME in the last 168 hours.  CBC: Recent Labs  Lab 01/31/20 1738 01/31/20 2033 02/01/20 0224 02/01/20 0844 02/01/20 1419 02/02/20 0411 02/03/20 0519 02/05/20 0311 02/06/20 0852  WBC 4.8   < > 4.8  --   --  4.7 5.0 4.4 5.8  NEUTROABS 3.1  --   --   --   --  4.0 3.8 3.5 4.6  HGB 5.9*   < > 9.3*   < > 9.2* 8.4* 8.0* 8.5* 9.1*  HCT 18.6*   < > 28.7*   < > 27.2* 24.6* 23.9* 25.3* 27.1*  MCV 103.3*   < > 97.6  --   --  92.8 96.0 95.1 95.4  PLT 188   < > 199  --   --  176 173 185 220   < > = values in this interval not displayed.   Cardiac Enzymes: No results for input(s): CKTOTAL, CKMB, CKMBINDEX, TROPONINI in the last 168 hours. BNP (last 3 results) No results for input(s): PROBNP in the last 8760 hours. CBG: Recent Labs  Lab 02/05/20 1112 02/05/20 1616 02/05/20 2105 02/06/20 0751 02/06/20 1125  GLUCAP 138* 167* 214* 119* 153*   D-Dimer: No results for input(s): DDIMER in the last 72 hours. Hgb A1c: No results for input(s): HGBA1C in the last 72 hours. Lipid Profile: No results for input(s): CHOL, HDL, LDLCALC,  TRIG, CHOLHDL, LDLDIRECT in the last 72 hours. Thyroid function studies: No results for input(s): TSH, T4TOTAL, T3FREE, THYROIDAB in the last 72 hours.  Invalid input(s): FREET3 Anemia work up: Recent Labs    02/04/20 1233  VITAMINB12 358  FOLATE 12.1  FERRITIN 132  TIBC 213*  IRON 52  RETICCTPCT 2.2   Sepsis Labs: Recent Labs  Lab 02/02/20 0411 02/03/20 0519 02/05/20 0311 02/06/20 0852  WBC 4.7 5.0 4.4 5.8    Microbiology Recent Results (from the past 240 hour(s))  SARS Coronavirus 2 by RT PCR (hospital order, performed in [facility] hospital lab) Nasopharyngeal Nasopharyngeal Swab     Status: None   Collection Time: 01/31/20  7:11 PM   Specimen: Nasopharyngeal Swab  Result Value Ref Range Status   SARS Coronavirus 2 NEGATIVE NEGATIVE Final    Comment: (NOTE) SARS-CoV-2 target nucleic acids are NOT DETECTED.  The SARS-CoV-2 RNA is generally detectable in upper and lower respiratory specimens during the acute phase of infection. The lowest concentration of SARS-CoV-2 viral copies this assay can detect is  250 copies / mL. A negative result does not preclude SARS-CoV-2 infection and should not be used as the sole basis for treatment or other patient management decisions.  A negative result may occur with improper specimen collection / handling, submission of specimen other than nasopharyngeal swab, presence of viral mutation(s) within the areas targeted by this assay, and inadequate number of viral copies (<250 copies / mL). A negative result must be combined with clinical observations, patient history, and epidemiological information.  Fact Sheet for Patients:   StrictlyIdeas.no  Fact Sheet for Healthcare Providers: BankingDealers.co.za  This test is not yet approved or  cleared by the [place] FDA and has been authorized for detection and/or diagnosis of SARS-CoV-2 by FDA under an Emergency Use Authorization  (EUA).  This EUA will remain in effect (meaning this test can be used) for the duration of the COVID-19 declaration under Section 564(b)(1) of the Act, 21 U.S.C. section 360bbb-3(b)(1), unless the authorization is terminated or revoked sooner.  Performed at [facility], [address] [place], [place] [address]     Procedures and diagnostic studies:  No results found.             LOS: 6 days   Levon Boettcher  [facility]     02/06/2020, 3:24 PM

## 2020-02-06 NOTE — Progress Notes (Signed)
SUBJECTIVE: Appears to be doing better   Vitals:   02/06/20 0621 02/06/20 0632 02/06/20 0749 02/06/20 1123  BP: (!) 199/66 (!) 197/70 (!) 188/81 (!) 168/67  Pulse: 62 67 70 63  Resp:   16 15  Temp:   98 F (36.7 C) 97.7 F (36.5 C)  TempSrc:      SpO2: 99% 99% 98% 98%  Weight:      Height:        Intake/Output Summary (Last 24 hours) at 02/06/2020 1146 Last data filed at 02/06/2020 0950 Gross per 24 hour  Intake 597 ml  Output 200 ml  Net 397 ml    LABS: Basic Metabolic Panel: Recent Labs    02/05/20 0311 02/06/20 0852  NA 141 139  K 4.0 4.5  CL 110 107  CO2 23 21*  GLUCOSE 63* 115*  BUN 86* 86*  CREATININE 2.73* 2.65*  CALCIUM 7.8* 8.0*   Liver Function Tests: No results for input(s): AST, ALT, ALKPHOS, BILITOT, PROT, ALBUMIN in the last 72 hours. No results for input(s): LIPASE, AMYLASE in the last 72 hours. CBC: Recent Labs    02/05/20 0311 02/06/20 0852  WBC 4.4 5.8  NEUTROABS 3.5 4.6  HGB 8.5* 9.1*  HCT 25.3* 27.1*  MCV 95.1 95.4  PLT 185 220   Cardiac Enzymes: No results for input(s): CKTOTAL, CKMB, CKMBINDEX, TROPONINI in the last 72 hours. BNP: Invalid input(s): POCBNP D-Dimer: No results for input(s): DDIMER in the last 72 hours. Hemoglobin A1C: No results for input(s): HGBA1C in the last 72 hours. Fasting Lipid Panel: No results for input(s): CHOL, HDL, LDLCALC, TRIG, CHOLHDL, LDLDIRECT in the last 72 hours. Thyroid Function Tests: No results for input(s): TSH, T4TOTAL, T3FREE, THYROIDAB in the last 72 hours.  Invalid input(s): FREET3 Anemia Panel: Recent Labs    02/04/20 1233  VITAMINB12 358  FOLATE 12.1  FERRITIN 132  TIBC 213*  IRON 52  RETICCTPCT 2.2     PHYSICAL EXAM General: Well developed, well nourished, in no acute distress HEENT:  Normocephalic and atramatic Neck:  No JVD.  Lungs: Clear bilaterally to auscultation and percussion. Heart: HRRR . Normal S1 and S2 without gallops or murmurs.  Abdomen: Bowel sounds  are positive, abdomen soft and non-tender  Msk:  Back normal, normal gait. Normal strength and tone for age. Extremities: No clubbing, cyanosis or edema.   Neuro: Alert and oriented X 3. Psych:  Good affect, responds appropriately  TELEMETRY: Sinus rhythm  ASSESSMENT AND PLAN: Hypertrophic cardiomyopathy with severe concentric LVH and uncontrolled hypertension.  Patient is feeling much better now.  Blood pressure is still elevated.  Currently on multiple medication for blood pressure.  Advise getting ultrasound of renal artery at this evaluate for renal artery stenosis.  Active Problems:   Diabetes mellitus with peripheral vascular disease (HCC)   CKD (chronic kidney disease) stage 4, GFR 15-29 ml/min (HCC)   GI bleeding   Pressure injury of skin   AKI (acute kidney injury) ([place])    Tara [name] A, MD, [facility] 02/06/2020 11:46 AM

## 2020-02-06 NOTE — Progress Notes (Signed)
Physical Therapy Treatment Patient Details Name: Tara [name] MRN: [id number] DOB: 1918/02/06 Today's Date: 02/06/2020    History of Present Illness Pt is a 86 yo female that presented to ED for hyptensive emergency, also heme positive stool without indication of GI bleed, has recieved a transfusion this hospital stay. PMH of asthma, HTN, DMII, hypothyroidsm, stage IV CKD.    PT Comments    Pt was asleep in recliner upon arrival. Pt was able to wake up and agreed to PT session. Pt vitals were taken prior to session: 64 HR, 97% SpO2, and 176/54 BP. Pt needed modA to get to the edge of recliner, and modA during sit to stand to RW (performed several repetitions this session with verbal cueing for hand placement). Pt was unsteady once in standing but able to take shuffled steps to EOB, min-modA for unsteadiness and RW management. Pt was able to her legs up on the bed with minimal assist. Pt was left in bed with family member, with call bell in reach. The patient would benefit from further skilled PT intervention to continue to progress towards goals. Recommendation remains appropriate. PT and family discussed potential need for hoyer lift if to return home.    Follow Up Recommendations  SNF     Equipment Recommendations  Wheelchair (measurements PT)    Recommendations for Other Services OT consult     Precautions / Restrictions Precautions Precautions: Fall Restrictions Weight Bearing Restrictions: No    Mobility  Bed Mobility Overal bed mobility: Needs Assistance Bed Mobility: Sit to Supine       Sit to supine: Min assist   General bed mobility comments: pt able to reposition in bed with supervision/minA  Transfers Overall transfer level: Needs assistance Equipment used: Rolling walker (2 wheeled) Transfers: Sit to/from Stand Sit to Stand: Mod assist         General transfer comment: Pt modA from recliner and EOB x4 reps this session  Ambulation/Gait              General Gait Details: Pt able to take several steps from recliner back to EOB, minA-modA throughout for RW and unsteadiness   Stairs             Wheelchair Mobility    Modified Rankin (Stroke Patients Only)       Balance Overall balance assessment: Needs assistance Sitting-balance support: Feet supported Sitting balance-Leahy Scale: Fair     Standing balance support: Bilateral upper extremity supported Standing balance-Leahy Scale: Poor Standing balance comment: requires support to achieve and maintain static standing balance                            Cognition Arousal/Alertness: Awake/alert Behavior During Therapy: WFL for tasks assessed/performed Overall Cognitive Status: Within Functional Limits for tasks assessed                                 General Comments: pt is alert and oriented to self, place and situation, able to follow commands pt with improved speech this session, much clearer      Exercises      General Comments        Pertinent Vitals/Pain Pain Assessment: No/denies pain    Home Living                      Prior Function  PT Goals (current goals can now be found in the care plan section) Progress towards PT goals: Progressing toward goals (slowly)    Frequency    Min 2X/week      PT Plan Current plan remains appropriate    Co-evaluation              AM-PAC PT "6 Clicks" Mobility   Outcome Measure  Help needed turning from your back to your side while in a flat bed without using bedrails?: A Little Help needed moving from lying on your back to sitting on the side of a flat bed without using bedrails?: A Little Help needed moving to and from a bed to a chair (including a wheelchair)?: A Lot Help needed standing up from a chair using your arms (e.g., wheelchair or bedside chair)?: A Lot Help needed to walk in hospital room?: A Lot Help needed climbing 3-5 steps with a  railing? : Total 6 Click Score: 13    End of Session Equipment Utilized During Treatment: Gait belt Activity Tolerance: Patient tolerated treatment well Patient left: in bed;with family/visitor present;with call bell/phone within reach;with bed alarm set Nurse Communication: Mobility status PT Visit Diagnosis: Other abnormalities of gait and mobility (R26.89);Muscle weakness (generalized) (M62.81);Difficulty in walking, not elsewhere classified (R26.2)     Time: 3762-8315 PT Time Calculation (min) (ACUTE ONLY): 26 min  Charges:  $Therapeutic Exercise: 23-37 mins                    Lieutenant Diego PT, DPT 3:21 PM,02/06/20

## 2020-02-07 LAB — BASIC METABOLIC PANEL
Anion gap: 11 (ref 5–15)
BUN: 87 mg/dL — ABNORMAL HIGH (ref 8–23)
CO2: 21 mmol/L — ABNORMAL LOW (ref 22–32)
Calcium: 7.9 mg/dL — ABNORMAL LOW (ref 8.9–10.3)
Chloride: 108 mmol/L (ref 98–111)
Creatinine, Ser: 2.52 mg/dL — ABNORMAL HIGH (ref 0.44–1.00)
GFR calc Af Amer: 18 mL/min — ABNORMAL LOW (ref >60)
GFR calc non Af Amer: 15 mL/min — ABNORMAL LOW (ref >60)
Glucose, Bld: 46 mg/dL — ABNORMAL LOW (ref 70–99)
Potassium: 4.4 mmol/L (ref 3.5–5.1)
Sodium: 140 mmol/L (ref 135–145)

## 2020-02-07 LAB — GLUCOSE, CAPILLARY
Glucose-Capillary: 47 mg/dL — ABNORMAL LOW (ref 70–99)
Glucose-Capillary: 69 mg/dL — ABNORMAL LOW (ref 70–99)
Glucose-Capillary: 92 mg/dL (ref 70–99)
Glucose-Capillary: 85 mg/dL (ref 70–99)
Glucose-Capillary: 115 mg/dL — ABNORMAL HIGH (ref 70–99)
Glucose-Capillary: 142 mg/dL — ABNORMAL HIGH (ref 70–99)

## 2020-02-07 MED ORDER — INSULIN ASPART 100 UNIT/ML ~~LOC~~ SOLN
0.0000 [IU] | Freq: Three times a day (TID) | SUBCUTANEOUS | Status: DC
  Administered 2020-02-08: 1 [IU] via SUBCUTANEOUS
  Filled 2020-02-07: qty 1

## 2020-02-07 NOTE — Progress Notes (Signed)
Inpatient Diabetes Program Recommendations  AACE/ADA: New Consensus Statement on Inpatient Glycemic Control (2015)  Target Ranges:  Prepandial:   less than 140 mg/dL      Peak postprandial:   less than 180 mg/dL (1-2 hours)      Critically ill patients:  140 - 180 mg/dL   Lab Results  Component Value Date   GLUCAP 47 (L) 02/07/2020   HGBA1C 4.9 02/01/2020    Review of Glycemic Control Results for Tara [name], Tara [name] (MRN [id number]) as of 02/07/2020 09:05  Ref. Range 02/06/2020 07:51 02/06/2020 11:25 02/06/2020 16:01 02/06/2020 20:56 02/07/2020 08:48  Glucose-Capillary Latest Ref Range: 70 - 99 mg/dL 119 (H) 153 (H) 156 (H) 166 (H) 47 (L)   Inpatient Diabetes Program Recommendations:   Noted hypoglycemia.  -Custom Novolog correction scale 0-5 units       151-200  1 unit      201-250  2 units      251-300  3 units      301-350  4 units      351-400  5 units  Thank you, Bethena Roys E. Nandini Bogdanski, RN, MSN, CDE  Diabetes Coordinator Inpatient Glycemic Control Team Team Pager [phone] (8am-5pm) 02/07/2020 9:07 AM

## 2020-02-07 NOTE — Progress Notes (Signed)
SUBJECTIVE: Patient is still feeling very weak but no chest pain or shortness of breath   Vitals:   02/07/20 0611 02/07/20 0812 02/07/20 0909 02/07/20 1157  BP: (!) 180/65 (!) 191/74 (!) 193/79 (!) 159/63  Pulse: 78 74  68  Resp: 17 14  16   Temp: 98.6 F (37 C) 98.6 F (37 C)  98.5 F (36.9 C)  TempSrc: Oral Oral    SpO2: 100% 95%  99%  Weight: 36.7 kg     Height:        Intake/Output Summary (Last 24 hours) at 02/07/2020 1330 Last data filed at 02/07/2020 0950 Gross per 24 hour  Intake 120 ml  Output 500 ml  Net -380 ml    LABS: Basic Metabolic Panel: Recent Labs    02/06/20 0852 02/07/20 0800  NA 139 140  K 4.5 4.4  CL 107 108  CO2 21* 21*  GLUCOSE 115* 46*  BUN 86* 87*  CREATININE 2.65* 2.52*  CALCIUM 8.0* 7.9*   Liver Function Tests: No results for input(s): AST, ALT, ALKPHOS, BILITOT, PROT, ALBUMIN in the last 72 hours. No results for input(s): LIPASE, AMYLASE in the last 72 hours. CBC: Recent Labs    02/05/20 0311 02/06/20 0852  WBC 4.4 5.8  NEUTROABS 3.5 4.6  HGB 8.5* 9.1*  HCT 25.3* 27.1*  MCV 95.1 95.4  PLT 185 220   Cardiac Enzymes: No results for input(s): CKTOTAL, CKMB, CKMBINDEX, TROPONINI in the last 72 hours. BNP: Invalid input(s): POCBNP D-Dimer: No results for input(s): DDIMER in the last 72 hours. Hemoglobin A1C: No results for input(s): HGBA1C in the last 72 hours. Fasting Lipid Panel: No results for input(s): CHOL, HDL, LDLCALC, TRIG, CHOLHDL, LDLDIRECT in the last 72 hours. Thyroid Function Tests: No results for input(s): TSH, T4TOTAL, T3FREE, THYROIDAB in the last 72 hours.  Invalid input(s): FREET3 Anemia Panel: No results for input(s): VITAMINB12, FOLATE, FERRITIN, TIBC, IRON, RETICCTPCT in the last 72 hours.   PHYSICAL EXAM General: Well developed, well nourished, in no acute distress HEENT:  Normocephalic and atramatic Neck:  No JVD.  Lungs: Clear bilaterally to auscultation and percussion. Heart: HRRR . Normal S1  and S2 without gallops or murmurs.  Abdomen: Bowel sounds are positive, abdomen soft and non-tender  Msk:  Back normal, normal gait. Normal strength and tone for age. Extremities: No clubbing, cyanosis or edema.   Neuro: Alert and oriented X 3. Psych:  Good affect, responds appropriately  TELEMETRY: Sinus rhythm  ASSESSMENT AND PLAN: Uncontrolled hypertension with severe concentric LVH and diastolic dysfunction with hypertrophic cardiomyopathy.  Blood pressure is gradually improving.  Active Problems:   Diabetes mellitus with peripheral vascular disease (HCC)   CKD (chronic kidney disease) stage 4, GFR 15-29 ml/min (HCC)   GI bleeding   Pressure injury of skin   AKI (acute kidney injury) ([place])    Skii Cleland A, MD, [facility] 02/07/2020 1:30 PM

## 2020-02-07 NOTE — Progress Notes (Signed)
Palliative:  Attempted to meet with family today; however they request rescheduling because they would like to include patient's son in [place] conversations and he is not available until tomorrow.  Family meeting scheduled for tomorrow Wednesday 6/30 at [place], DNP, [facility] Palliative Medicine Team Team Phone # [phone]  Pager # [phone]  NO CHARGE

## 2020-02-07 NOTE — Progress Notes (Signed)
Progress Note    Tara [name]  IZT:[id number] DOB: 1918-07-25  DOA: 01/31/2020 PCP: Einar Pheasant, MD      Brief Narrative:    Medical records reviewed and are as summarized below:  Tara [name] is a 86 y.o. female  with a known history of asthma, hypertension and type 2 diabetes mellitus, who presented to the emergency room with acute onset of generalized weakness, cough, exertional shortness of breath.  She was admitted to the stepdown unit for severe anemia, occult GI bleeding (positive heme stools), hypertensive emergency acute on chronic diastolic CHF.  She was transfused with 1 unit of packed red blood cells and hemoglobin improved.  She was treated with IV Lasix for acute exacerbation of CHF.  She required IV nicardipine infusion for hypertensive emergency.  She developed AKI so IV Lasix was discontinued and she was treated with IV fluids.  Nephrologist, gastroenterologist and cardiologist were consulted to assist with management.  Her daughter did not want any invasive work-up.  Minoxidil and doxazosin were added to antihypertensive regimen because of uncontrolled blood pressure.  She was evaluated by PT and OT who recommended for discharge to SNF.    Assessment/Plan:   Active Problems:   Diabetes mellitus with peripheral vascular disease (HCC)   CKD (chronic kidney disease) stage 4, GFR 15-29 ml/min (HCC)   GI bleeding   Pressure injury of skin   AKI (acute kidney injury) (HCC)    Positive heme stools with severe anemia in a patient with history of anemia of chronic disease S/p transfusion with 1 unit of packed red blood cells on 6/23.  H&H is stable.  Patient was evaluated by gastroenterologist and conservative management was recommended.  Patient's daughter does not want any invasive procedures.  Continue Protonix  Hypertensive emergency Continue antihypertensives.  Minoxidil was added, amlodipine was switched to nifedipine and hydralazine was decreased back  to 100 mg every 8 hours by nephrologist on 02/05/2020.  Doxazosin was added on 02/02/2020 for adequate BP control. .  Acute new onset likely diastolic CHF with pulmonary edema.  This could be secondary to hypertensive emergency and high-output failure. Monitor daily weights, BMP and urine output.  Patient has been evaluated by cardiologist.  2D echo showed EF estimated >38%, grade 1 diastolic dysfunction, severe asymmetric LVH, moderately elevated pulmonary artery systolic pressure.  AKI on Stage IV CKD -Creatinine is close to baseline.  Monitor BMP. Follow up with nephrologist to assist with management.  Hypothyroidism. Continue Synthroid  Type 2 diabetes mellitus. NovoLog as needed for hyperglycemia  History of stroke/chronic lacunar infarcts Hold aspirin for now.  Sinus bradycardia Asymptomatic.   Generalized weakness PT recommend discharge to SNF.  Follow-up with social worker to assist with disposition.  Stage II sacral decubitus ulcer  Present on admission.  Turn patient every 2 hours.  Continue local wound care.  Body mass index is 16.89 kg/m.  (Underweight)  Diet Order            DIET DYS 2 Room service appropriate? Yes; Fluid consistency: Thin  Diet effective now                 Discussed palliative care and hospice with patient's daughter, [name].  She requested that the palliative care team speak to her and other members of her family before making any decision.  Follow-up with palliative care team.    Medications:   . Chlorhexidine Gluconate Cloth  6 each Topical Daily  . cloNIDine  0.1 mg Oral TID  . doxazosin  1 mg Oral Daily  . epoetin (EPOGEN/PROCRIT) injection  3,000 Units Subcutaneous Q14 Days  . feeding supplement (ENSURE ENLIVE)  237 mL Oral TID BM  . hydrALAZINE  100 mg Oral Q8H  . insulin aspart  0-5 Units Subcutaneous TID WC  . labetalol  100 mg Oral BID  . levothyroxine  50 mcg Oral Q0600  . loratadine  10 mg Oral Daily  . minoxidil  5  mg Oral Daily  . multivitamin with minerals  1 tablet Oral Daily  . NIFEdipine  60 mg Oral Daily  . pantoprazole  40 mg Intravenous Q12H   Continuous Infusions:    Anti-infectives (From admission, onward)   None             Family Communication/Anticipated D/C date and plan/Code Status   DVT prophylaxis: SCDs Start: 01/31/20 1948     Code Status: DNR    Family Communication: None Disposition Plan:    Status is: Inpatient  Remains inpatient appropriate because:Inpatient level of care appropriate due to severity of illness   Dispo: The patient is from: Home              Anticipated d/c is to: SNF              Anticipated d/c date is: 1 day              Patient currently is not medically stable to d/c.           Subjective:   No new complaints.  She feels okay.  Blood pressure is still uncontrolled.  Objective:    Vitals:   02/07/20 0812 02/07/20 0909 02/07/20 1157 02/07/20 1451  BP: (!) 191/74 (!) 193/79 (!) 159/63 (!) 153/61  Pulse: 74  68 69  Resp: 14  16 14   Temp: 98.6 F (37 C)  98.5 F (36.9 C) 98.3 F (36.8 C)  TempSrc: Oral     SpO2: 95%  99% 98%  Weight:      Height:       No data found.   Intake/Output Summary (Last 24 hours) at 02/07/2020 1643 Last data filed at 02/07/2020 0950 Gross per 24 hour  Intake 120 ml  Output 500 ml  Net -380 ml   Filed Weights   02/05/20 1813 02/06/20 0510 02/07/20 0611  Weight: 37.3 kg 37.4 kg 36.7 kg    Exam:  GEN: No acute distress SKIN: Stage II sacral decubitus ulcer EYES: No pallor or icterus ENT: MMM CV: Regular rate and rhythm PULM: No wheezing or rales heard ABD: soft, ND, NT, +BS CNS: AAO x 2 (person and place), non focal EXT: No edema.  Mild b/l leg tenderness (increased sensitivity to touch)   Data Reviewed:   I have personally reviewed following labs and imaging studies:  Labs: Labs show the following:   Basic Metabolic Panel: Recent Labs  Lab 02/01/20 0224  02/02/20 0411 02/03/20 0519 02/03/20 0519 02/04/20 0454 02/04/20 0454 02/05/20 0311 02/05/20 0311 02/06/20 0852 02/07/20 0800  NA 145   < > 142  --  141  --  141  --  139 140  K 4.6   < > 4.4   < > 4.2   < > 4.0   < > 4.5 4.4  CL 117*   < > 111  --  110  --  110  --  107 108  CO2 20*   < > 21*  --  22  --  23  --  21* 21*  GLUCOSE 144*   < > 111*  --  117*  --  63*  --  115* 46*  BUN 72*   < > 91*  --  82*  --  86*  --  86* 87*  CREATININE 2.50*   < > 2.97*  --  2.87*  --  2.73*  --  2.65* 2.52*  CALCIUM 9.6   < > 8.3*  --  8.0*  --  7.8*  --  8.0* 7.9*  MG 2.1  --   --   --   --   --   --   --   --   --    < > = values in this interval not displayed.   GFR Estimated Creatinine Clearance: 7 mL/min (A) (by C-G formula based on SCr of 2.52 mg/dL (H)). Liver Function Tests: Recent Labs  Lab 01/31/20 1738  AST 24  ALT 11  ALKPHOS 39  BILITOT 0.8  PROT 6.7  ALBUMIN 3.1*   No results for input(s): LIPASE, AMYLASE in the last 168 hours. No results for input(s): AMMONIA in the last 168 hours. Coagulation profile No results for input(s): INR, PROTIME in the last 168 hours.  CBC: Recent Labs  Lab 01/31/20 1738 01/31/20 2033 02/01/20 0224 02/01/20 0844 02/01/20 1419 02/02/20 0411 02/03/20 0519 02/05/20 0311 02/06/20 0852  WBC 4.8   < > 4.8  --   --  4.7 5.0 4.4 5.8  NEUTROABS 3.1  --   --   --   --  4.0 3.8 3.5 4.6  HGB 5.9*   < > 9.3*   < > 9.2* 8.4* 8.0* 8.5* 9.1*  HCT 18.6*   < > 28.7*   < > 27.2* 24.6* 23.9* 25.3* 27.1*  MCV 103.3*   < > 97.6  --   --  92.8 96.0 95.1 95.4  PLT 188   < > 199  --   --  176 173 185 220   < > = values in this interval not displayed.   Cardiac Enzymes: No results for input(s): CKTOTAL, CKMB, CKMBINDEX, TROPONINI in the last 168 hours. BNP (last 3 results) No results for input(s): PROBNP in the last 8760 hours. CBG: Recent Labs  Lab 02/06/20 2056 02/07/20 0848 02/07/20 0933 02/07/20 1018 02/07/20 1200  GLUCAP 166* 47* 69* 92  85   D-Dimer: No results for input(s): DDIMER in the last 72 hours. Hgb A1c: No results for input(s): HGBA1C in the last 72 hours. Lipid Profile: No results for input(s): CHOL, HDL, LDLCALC, TRIG, CHOLHDL, LDLDIRECT in the last 72 hours. Thyroid function studies: No results for input(s): TSH, T4TOTAL, T3FREE, THYROIDAB in the last 72 hours.  Invalid input(s): FREET3 Anemia work up: No results for input(s): VITAMINB12, FOLATE, FERRITIN, TIBC, IRON, RETICCTPCT in the last 72 hours. Sepsis Labs: Recent Labs  Lab 02/02/20 0411 02/03/20 0519 02/05/20 0311 02/06/20 0852  WBC 4.7 5.0 4.4 5.8    Microbiology Recent Results (from the past 240 hour(s))  SARS Coronavirus 2 by RT PCR (hospital order, performed in [facility] hospital lab) Nasopharyngeal Nasopharyngeal Swab     Status: None   Collection Time: 01/31/20  7:11 PM   Specimen: Nasopharyngeal Swab  Result Value Ref Range Status   SARS Coronavirus 2 NEGATIVE NEGATIVE Final    Comment: (NOTE) SARS-CoV-2 target nucleic acids are NOT DETECTED.  The SARS-CoV-2 RNA is generally detectable in upper and lower respiratory specimens  during the acute phase of infection. The lowest concentration of SARS-CoV-2 viral copies this assay can detect is 250 copies / mL. A negative result does not preclude SARS-CoV-2 infection and should not be used as the sole basis for treatment or other patient management decisions.  A negative result may occur with improper specimen collection / handling, submission of specimen other than nasopharyngeal swab, presence of viral mutation(s) within the areas targeted by this assay, and inadequate number of viral copies (<250 copies / mL). A negative result must be combined with clinical observations, patient history, and epidemiological information.  Fact Sheet for Patients:   StrictlyIdeas.no  Fact Sheet for Healthcare Providers: BankingDealers.co.za  This  test is not yet approved or  cleared by the [place] FDA and has been authorized for detection and/or diagnosis of SARS-CoV-2 by FDA under an Emergency Use Authorization (EUA).  This EUA will remain in effect (meaning this test can be used) for the duration of the COVID-19 declaration under Section 564(b)(1) of the Act, 21 U.S.C. section 360bbb-3(b)(1), unless the authorization is terminated or revoked sooner.  Performed at [facility], [address] [place],  [address]     Procedures and diagnostic studies:  No results found.             LOS: 7 days   Lean Jaeger  [facility]     02/07/2020, 4:43 PM

## 2020-02-07 NOTE — Progress Notes (Signed)
Hypoglycemic Event  CBG: 46  Treatment: 8 oz juice/soda ; breakfast  Symptoms: None  Follow-up CBG: Time:1018 CBG Result:92  Possible Reasons for Event: Inadequate meal intake    Terrilyn Saver

## 2020-02-07 NOTE — Progress Notes (Signed)
[facility]  ROUNDING NOTE   Subjective:   Patient states she is feeling better.   Objective:  Vital signs in last 24 hours:  Temp:  [97.9 F (36.6 C)-98.6 F (37 C)] 98.3 F (36.8 C) (06/29 1451) Pulse Rate:  [68-88] 69 (06/29 1451) Resp:  [14-18] 14 (06/29 1451) BP: (153-194)/(61-79) 153/61 (06/29 1451) SpO2:  [95 %-100 %] 98 % (06/29 1451) Weight:  [36.7 kg] 36.7 kg (06/29 0611)  Weight change: -0.68 kg Filed Weights   02/05/20 1813 02/06/20 0510 02/07/20 0611  Weight: 37.3 kg 37.4 kg 36.7 kg    Intake/Output: I/O last 3 completed shifts: In: 41 [P.O.:597] Out: 700 [Urine:700]   Intake/Output this shift:  Total I/O In: 120 [P.O.:120] Out: -   Physical Exam: General: NAD,   Head: Normocephalic, atraumatic. Moist oral mucosal membranes  Eyes: Anicteric, PERRL  Neck: Supple, trachea midline  Lungs:  Clear to auscultation  Heart: Regular rate and rhythm  Abdomen:  +soft  Extremities:  no peripheral edema.  Neurologic: Nonfocal, moving all four extremities  Skin: No lesions        Basic Metabolic Panel: Recent Labs  Lab 02/01/20 0224 02/02/20 0411 02/03/20 0519 02/03/20 0519 02/04/20 0454 02/04/20 0454 02/05/20 0311 02/06/20 0852 02/07/20 0800  NA 145   < > 142  --  141  --  141 139 140  K 4.6   < > 4.4  --  4.2  --  4.0 4.5 4.4  CL 117*   < > 111  --  110  --  110 107 108  CO2 20*   < > 21*  --  22  --  23 21* 21*  GLUCOSE 144*   < > 111*  --  117*  --  63* 115* 46*  BUN 72*   < > 91*  --  82*  --  86* 86* 87*  CREATININE 2.50*   < > 2.97*  --  2.87*  --  2.73* 2.65* 2.52*  CALCIUM 9.6   < > 8.3*   < > 8.0*   < > 7.8* 8.0* 7.9*  MG 2.1  --   --   --   --   --   --   --   --    < > = values in this interval not displayed.    Liver Function Tests: Recent Labs  Lab 01/31/20 1738  AST 24  ALT 11  ALKPHOS 39  BILITOT 0.8  PROT 6.7  ALBUMIN 3.1*   No results for input(s): LIPASE, AMYLASE in the last 168 hours. No results for  input(s): AMMONIA in the last 168 hours.  CBC: Recent Labs  Lab 01/31/20 1738 01/31/20 2033 02/01/20 0224 02/01/20 0844 02/01/20 1419 02/02/20 0411 02/03/20 0519 02/05/20 0311 02/06/20 0852  WBC 4.8   < > 4.8  --   --  4.7 5.0 4.4 5.8  NEUTROABS 3.1  --   --   --   --  4.0 3.8 3.5 4.6  HGB 5.9*   < > 9.3*   < > 9.2* 8.4* 8.0* 8.5* 9.1*  HCT 18.6*   < > 28.7*   < > 27.2* 24.6* 23.9* 25.3* 27.1*  MCV 103.3*   < > 97.6  --   --  92.8 96.0 95.1 95.4  PLT 188   < > 199  --   --  176 173 185 220   < > = values in this interval not displayed.  Cardiac Enzymes: No results for input(s): CKTOTAL, CKMB, CKMBINDEX, TROPONINI in the last 168 hours.  BNP: Invalid input(s): POCBNP  CBG: Recent Labs  Lab 02/06/20 2056 02/07/20 0848 02/07/20 0933 02/07/20 1018 02/07/20 1200  GLUCAP 166* 47* 69* 92 85    Microbiology: Results for orders placed or performed during the hospital encounter of 01/31/20  SARS Coronavirus 2 by RT PCR (hospital order, performed in [facility] hospital lab) Nasopharyngeal Nasopharyngeal Swab     Status: None   Collection Time: 01/31/20  7:11 PM   Specimen: Nasopharyngeal Swab  Result Value Ref Range Status   SARS Coronavirus 2 NEGATIVE NEGATIVE Final    Comment: (NOTE) SARS-CoV-2 target nucleic acids are NOT DETECTED.  The SARS-CoV-2 RNA is generally detectable in upper and lower respiratory specimens during the acute phase of infection. The lowest concentration of SARS-CoV-2 viral copies this assay can detect is 250 copies / mL. A negative result does not preclude SARS-CoV-2 infection and should not be used as the sole basis for treatment or other patient management decisions.  A negative result may occur with improper specimen collection / handling, submission of specimen other than nasopharyngeal swab, presence of viral mutation(s) within the areas targeted by this assay, and inadequate number of viral copies (<250 copies / mL). A negative result  must be combined with clinical observations, patient history, and epidemiological information.  Fact Sheet for Patients:   StrictlyIdeas.no  Fact Sheet for Healthcare Providers: BankingDealers.co.za  This test is not yet approved or  cleared by the [place] FDA and has been authorized for detection and/or diagnosis of SARS-CoV-2 by FDA under an Emergency Use Authorization (EUA).  This EUA will remain in effect (meaning this test can be used) for the duration of the COVID-19 declaration under Section 564(b)(1) of the Act, 21 U.S.C. section 360bbb-3(b)(1), unless the authorization is terminated or revoked sooner.  Performed at [facility], [address] [place], [place] [address]     Coagulation Studies: No results for input(s): LABPROT, INR in the last 72 hours.  Urinalysis: No results for input(s): COLORURINE, LABSPEC, PHURINE, GLUCOSEU, HGBUR, BILIRUBINUR, KETONESUR, PROTEINUR, UROBILINOGEN, NITRITE, LEUKOCYTESUR in the last 72 hours.  Invalid input(s): APPERANCEUR    Imaging: No results found.   Medications:    . Chlorhexidine Gluconate Cloth  6 each Topical Daily  . cloNIDine  0.1 mg Oral TID  . doxazosin  1 mg Oral Daily  . epoetin (EPOGEN/PROCRIT) injection  3,000 Units Subcutaneous Q14 Days  . feeding supplement (ENSURE ENLIVE)  237 mL Oral TID BM  . hydrALAZINE  100 mg Oral Q8H  . insulin aspart  0-5 Units Subcutaneous TID WC  . labetalol  100 mg Oral BID  . levothyroxine  50 mcg Oral Q0600  . loratadine  10 mg Oral Daily  . minoxidil  5 mg Oral Daily  . multivitamin with minerals  1 tablet Oral Daily  . NIFEdipine  60 mg Oral Daily  . pantoprazole  40 mg Intravenous Q12H   acetaminophen **OR** acetaminophen, labetalol, ondansetron **OR** ondansetron (ZOFRAN) IV, traZODone  Assessment/ Plan:  Ms. AMARAH BROSSMAN is a 86 y.o. black female with diabetes mellitus, hypertension, asthma,  peripheral vascular disease admitted to [facility] 01/31/2020 for GI bleeding [K92.2] Acute pulmonary edema ([place]) [J81.0] Weakness [R53.1] Heme positive stool [R19.5] Anemia, unspecified type [D64.9] Chronic kidney disease, unspecified CKD stage [N18.9]  1. Acute renal failure on chronic kidney disease stage IV with proteinuria: Baseline creatinine of 2.06, GFR of 19  on 08/24/19.  Acute renal failure secondary to hypertensive urgency.  Chronic kidney disease secondary to hypertension and age.  Creatinine trending down. No indication for dialysis. Holding torsemide.    2. Hypertension: remains elevated 153/61 hypertensive emergency on admission. Home regimen of amlodipine, clonidine, hydralazine, labetalol Current regimen of clonidine, doxazosin, hydralazine, labetalol, minoxidil, nifedipine.  PRN labetalol.  Allow blood pressure to ride high , keep below SBP <180.   3. Anemia of chronic kidney disease:   - EPO subcu  .     LOS: 7 Ephram Kornegay 6/29/20213:29 PM

## 2020-02-07 NOTE — Progress Notes (Signed)
Nutrition Follow Up Note   DOCUMENTATION CODES:   Severe malnutrition in context of chronic illness, Underweight  INTERVENTION:   Ensure Enlive po TID, each supplement provides 350 kcal and 20 grams of protein.  Magic cup TID with meals, each supplement provides 290 kcal and 9 grams of protein  MVI daily   Recommend consider appetite stimulant   NUTRITION DIAGNOSIS:   Severe Malnutrition related to chronic illness (CKD, advanced age, inadequate oral intake) as evidenced by severe fat depletion, severe muscle depletion.  GOAL:   Patient will meet greater than or equal to 90% of their needs  -not met   MONITOR:   PO intake, Supplement acceptance, Labs, Weight trends, Skin, I & O's  ASSESSMENT:   86 year old female with PMHx of asthma, DM, HTN, hypothyroidism admitted with GI bleed with acute blood loss anemia on top of anemia of chronic disease, hypertensive urgency, HFpEF, AKI on CKD stage III.   Pt with poor appetite and oral intake; pt eating anywhere from sips and bites to 50% of meals. Pt ate only bites of her breakfast and lunch today. Pt is drinking some Ensure but is refusing most of it. RD will add Magic Cups to meal trays. Palliative care consult pending; RD will monitor for GOC. Would consider appetite stimulant to help pt meet her estimated needs.   Per chart, pt is weight stable since admit.   Medications reviewed and include: epoetin, insulin, synthroid, MVI, protonix  Labs reviewed: BUN 87(H), creat 2.52(H) Hgb 9.1(L), Hct 27.1(L)  Diet Order:   Diet Order            DIET DYS 2 Room service appropriate? Yes; Fluid consistency: Thin  Diet effective now                EDUCATION NEEDS:   No education needs have been identified at this time  Skin:  Skin Assessment: Skin Integrity Issues: Skin Integrity Issues:: Stage II Stage II: sacrum  Last BM:  6/27- type 6  Height:   Ht Readings from Last 1 Encounters:  02/05/20 4' 10"  (1.473 m)   Weight:    Wt Readings from Last 1 Encounters:  02/07/20 36.7 kg   Ideal Body Weight:  45.5 kg  BMI:  Body mass index is 16.89 kg/m.  Estimated Nutritional Needs:   Kcal:  1200-1400  Protein:  60-70 grams  Fluid:  1.2-1.4 L/day  Koleen Distance MS, RD, LDN Please refer to [facility] for RD and/or RD on-call/weekend/after hours pager

## 2020-02-08 DIAGNOSIS — L89152 Pressure ulcer of sacral region, stage 2: Secondary | ICD-10-CM

## 2020-02-08 DIAGNOSIS — J810 Acute pulmonary edema: Secondary | ICD-10-CM

## 2020-02-08 DIAGNOSIS — N189 Chronic kidney disease, unspecified: Secondary | ICD-10-CM

## 2020-02-08 DIAGNOSIS — Z7189 Other specified counseling: Secondary | ICD-10-CM

## 2020-02-08 DIAGNOSIS — Z515 Encounter for palliative care: Secondary | ICD-10-CM

## 2020-02-08 LAB — GLUCOSE, CAPILLARY
Glucose-Capillary: 141 mg/dL — ABNORMAL HIGH (ref 70–99)
Glucose-Capillary: 189 mg/dL — ABNORMAL HIGH (ref 70–99)
Glucose-Capillary: 148 mg/dL — ABNORMAL HIGH (ref 70–99)
Glucose-Capillary: 163 mg/dL — ABNORMAL HIGH (ref 70–99)

## 2020-02-08 NOTE — Progress Notes (Signed)
PROGRESS NOTE  VASHON ARCH OPF:[id number] DOB: 04/23/1918 DOA: 01/31/2020 PCP: Einar Pheasant, MD   LOS: 8 days   Brief narrative: As per HPI,  Tara [name] is a 86 y.o. female with a known history ofasthma, hypertension and type 2 diabetes mellitus, who presented to the emergency room with acute onset of generalized weakness, cough, exertional shortness of breath.She was admitted to the stepdown unit for severe anemia, occult GI bleeding (positive heme stools), hypertensive emergency acute on chronic diastolic CHF.  She was transfused with 1 unit of packed red blood cells and hemoglobin improved.  She was treated with IV Lasix for acute exacerbation of CHF.  She required IV nicardipine infusion for hypertensive emergency.  She developed AKI so IV Lasix was discontinued and she was treated with IV fluids.  Nephrologist, gastroenterologist and cardiologist were consulted to assist with management.  Her daughter did not want any invasive work-up.  Minoxidil and doxazosin were added to antihypertensive regimen because of uncontrolled blood pressure. She was evaluated by PT and OT who recommended for discharge to SNF.    Assessment/Plan:  Active Problems:   Diabetes mellitus with peripheral vascular disease (HCC)   CKD (chronic kidney disease) stage 4, GFR 15-29 ml/min (HCC)   GI bleeding   Pressure injury of skin   AKI (acute kidney injury) (HCC)  Heme positive stool with severe anemia, anemia of chronic disease.  Status post 1 unit of packed RBC on 6/23.  Seen by GI.  Management conservative as per GI.  Continue Protonix.  Acute renal failure on chronic kidney disease stage IV with proteinuria: Baseline creatinine of 2.06, GFR of 19 on 08/24/19.  Nephrology on board.  No indication for hemodialysis.  Torsemide on hold  New onset diastolic congestive heart failure with pulmonary edema likely secondary to hypertensive emergency.  Cardiology on board.  2D echocardiogram with EF more than 75%  with grade 1 diastolic dysfunction, LVH, moderately elevated pulmonary artery systolic pressure.  Essential Hypertension:  Patient initially declined nicardipine drip for hypertensive urgency.  Currently on  clonidine, doxazosin, hydralazine, labetalol, minoxidil, nifedipine. PRN labetalol.  Nephrology recommending blood pressures goal of systolic less than 381  Anemia of chronic kidney disease:   Erythropoietin as per nephrology.  Hypothyroidism.  Continue Synthroid.  Type 2 diabetes mellitus.  Continue sliding scale insulin, Accu-Cheks, diabetic diet.  History of stroke.  Hold aspirin for now.  Sinus bradycardia.  Asymptomatic.  Will closely monitor.  Generalized weakness.  PT recommends skilled nursing facility placement.  Sacral decubitus ulceration present on admission stage II.  Continue wound care.  Pressure Injury 02/01/20 Sacrum Posterior Stage 2 -  Partial thickness loss of dermis presenting as a shallow open injury with a red, pink wound bed without slough. (Active)  02/01/20 0030  Location: Sacrum  Location Orientation: Posterior  Staging: Stage 2 -  Partial thickness loss of dermis presenting as a shallow open injury with a red, pink wound bed without slough.  Wound Description (Comments):   Present on Admission: Yes     DVT prophylaxis: SCDs Start: 01/31/20 1948   Code Status: DNR  Family Communication: Palliative care having discussion with family today.  Status is: Inpatient  Remains inpatient appropriate because:Inpatient level of care appropriate due to severity of illness, palliative care discussion  Dispo: The patient is from: Home              Anticipated d/c is to: SNF per physical therapy recommendation  Anticipated d/c date is: 1 to 2 days              Patient currently is not medically stable to d/c.    Consultants:   Palliative care  Nephrology  Cardiology  Procedures:  None  Antibiotics:   None  Anti-infectives  (From admission, onward)   None     Subjective:  Today, patient was seen and examined at bedside.  Denies any further bowel movements.  Last bowel movement on 05/07/2020. Denies shortness of breath, fever or chills.  No nausea vomiting.  Objective: Vitals:   02/08/20 0617 02/08/20 0617  BP: (!) 180/60 (!) 180/60  Pulse:  77  Resp:  20  Temp:  99.4 F (37.4 C)  SpO2:  96%    Intake/Output Summary (Last 24 hours) at 02/08/2020 0744 Last data filed at 02/07/2020 0950 Gross per 24 hour  Intake 120 ml  Output --  Net 120 ml   Filed Weights   02/06/20 0510 02/07/20 0611 02/08/20 0300  Weight: 37.4 kg 36.7 kg 36.6 kg   Body mass index is 16.86 kg/m.   Physical Exam:  GENERAL: Patient is alert awake and communicative. Not in obvious distress.  Thinly built HENT: No scleral pallor or icterus. Pupils equally reactive to light. Oral mucosa is moist NECK: is supple, no gross swelling noted. CHEST: Clear to auscultation. No crackles or wheezes.  Diminished breath sounds bilaterally. CVS: S1 and S2 heard, no murmur. Regular rate and rhythm.  ABDOMEN: Soft, non-tender, bowel sounds are present. EXTREMITIES: No edema. CNS: Cranial nerves are intact. No focal motor deficits. SKIN: warm and dry, stage II sacral decubitus ulceration present on admission  Data Review: I have personally reviewed the following laboratory data and studies,  CBC: Recent Labs  Lab 02/01/20 1419 02/02/20 0411 02/03/20 0519 02/05/20 0311 02/06/20 0852  WBC  --  4.7 5.0 4.4 5.8  NEUTROABS  --  4.0 3.8 3.5 4.6  HGB 9.2* 8.4* 8.0* 8.5* 9.1*  HCT 27.2* 24.6* 23.9* 25.3* 27.1*  MCV  --  92.8 96.0 95.1 95.4  PLT  --  176 173 185 161   Basic Metabolic Panel: Recent Labs  Lab 02/03/20 0519 02/04/20 0454 02/05/20 0311 02/06/20 0852 02/07/20 0800  NA 142 141 141 139 140  K 4.4 4.2 4.0 4.5 4.4  CL 111 110 110 107 108  CO2 21* 22 23 21* 21*  GLUCOSE 111* 117* 63* 115* 46*  BUN 91* 82* 86* 86* 87*   CREATININE 2.97* 2.87* 2.73* 2.65* 2.52*  CALCIUM 8.3* 8.0* 7.8* 8.0* 7.9*   Liver Function Tests: No results for input(s): AST, ALT, ALKPHOS, BILITOT, PROT, ALBUMIN in the last 168 hours. No results for input(s): LIPASE, AMYLASE in the last 168 hours. No results for input(s): AMMONIA in the last 168 hours. Cardiac Enzymes: No results for input(s): CKTOTAL, CKMB, CKMBINDEX, TROPONINI in the last 168 hours. BNP (last 3 results) No results for input(s): BNP in the last 8760 hours.  ProBNP (last 3 results) No results for input(s): PROBNP in the last 8760 hours.  CBG: Recent Labs  Lab 02/07/20 0933 02/07/20 1018 02/07/20 1200 02/07/20 1653 02/07/20 2052  GLUCAP 69* 92 85 115* 142*   Recent Results (from the past 240 hour(s))  SARS Coronavirus 2 by RT PCR (hospital order, performed in [facility] hospital lab) Nasopharyngeal Nasopharyngeal Swab     Status: None   Collection Time: 01/31/20  7:11 PM   Specimen: Nasopharyngeal Swab  Result Value Ref  Range Status   SARS Coronavirus 2 NEGATIVE NEGATIVE Final    Comment: (NOTE) SARS-CoV-2 target nucleic acids are NOT DETECTED.  The SARS-CoV-2 RNA is generally detectable in upper and lower respiratory specimens during the acute phase of infection. The lowest concentration of SARS-CoV-2 viral copies this assay can detect is 250 copies / mL. A negative result does not preclude SARS-CoV-2 infection and should not be used as the sole basis for treatment or other patient management decisions.  A negative result may occur with improper specimen collection / handling, submission of specimen other than nasopharyngeal swab, presence of viral mutation(s) within the areas targeted by this assay, and inadequate number of viral copies (<250 copies / mL). A negative result must be combined with clinical observations, patient history, and epidemiological information.  Fact Sheet for Patients:   StrictlyIdeas.no  Fact  Sheet for Healthcare Providers: BankingDealers.co.za  This test is not yet approved or  cleared by the [place] FDA and has been authorized for detection and/or diagnosis of SARS-CoV-2 by FDA under an Emergency Use Authorization (EUA).  This EUA will remain in effect (meaning this test can be used) for the duration of the COVID-19 declaration under Section 564(b)(1) of the Act, 21 U.S.C. section 360bbb-3(b)(1), unless the authorization is terminated or revoked sooner.  Performed at [facility], [address] [place], [place] [address]      Studies: No results found.    Flora Lipps, MD  [facility] 02/08/2020

## 2020-02-08 NOTE — Progress Notes (Signed)
SUBJECTIVE: Patient resting comfortably. Denies chest pain or shortness of breath. Only complaints is legs pain.   Vitals:   02/08/20 0300 02/08/20 0617 02/08/20 0617 02/08/20 0751  BP:  (!) 180/60 (!) 180/60 (!) 147/49  Pulse:   77 72  Resp:   20 16  Temp:   99.4 F (37.4 C) 99.1 F (37.3 C)  TempSrc:   Oral Oral  SpO2:   96% 99%  Weight: 36.6 kg     Height:       No intake or output data in the 24 hours ending 02/08/20 0956  LABS: Basic Metabolic Panel: Recent Labs    02/06/20 0852 02/07/20 0800  NA 139 140  K 4.5 4.4  CL 107 108  CO2 21* 21*  GLUCOSE 115* 46*  BUN 86* 87*  CREATININE 2.65* 2.52*  CALCIUM 8.0* 7.9*   Liver Function Tests: No results for input(s): AST, ALT, ALKPHOS, BILITOT, PROT, ALBUMIN in the last 72 hours. No results for input(s): LIPASE, AMYLASE in the last 72 hours. CBC: Recent Labs    02/06/20 0852  WBC 5.8  NEUTROABS 4.6  HGB 9.1*  HCT 27.1*  MCV 95.4  PLT 220   Cardiac Enzymes: No results for input(s): CKTOTAL, CKMB, CKMBINDEX, TROPONINI in the last 72 hours. BNP: Invalid input(s): POCBNP D-Dimer: No results for input(s): DDIMER in the last 72 hours. Hemoglobin A1C: No results for input(s): HGBA1C in the last 72 hours. Fasting Lipid Panel: No results for input(s): CHOL, HDL, LDLCALC, TRIG, CHOLHDL, LDLDIRECT in the last 72 hours. Thyroid Function Tests: No results for input(s): TSH, T4TOTAL, T3FREE, THYROIDAB in the last 72 hours.  Invalid input(s): FREET3 Anemia Panel: No results for input(s): VITAMINB12, FOLATE, FERRITIN, TIBC, IRON, RETICCTPCT in the last 72 hours.   PHYSICAL EXAM General: Well developed, well nourished, in no acute distress HEENT:  Normocephalic and atramatic Neck:  No JVD.  Lungs: Clear bilaterally to auscultation and percussion. Heart: HRRR . Normal S1 and S2. III/VI Systolic Murmur Abdomen: Bowel sounds are positive, abdomen soft and non-tender  Msk:  Generalized weakness Extremities: No  clubbing, cyanosis or edema.   Neuro: Alert and oriented X 3. Psych:  Good affect, responds appropriately  TELEMETRY: NSR 80s  ASSESSMENT AND PLAN: Uncontrolled hypertension with severe concentric LVH and diastolic dysfunction with hypertrophic cardiomyopathy.  Blood pressure is gradually improving. Agree with nephrology, ok for BP to be mildly elevated. Goal Systolic <947.  Active Problems:   Diabetes mellitus with peripheral vascular disease (HCC)   CKD (chronic kidney disease) stage 4, GFR 15-29 ml/min (HCC)   GI bleeding   Pressure injury of skin   AKI (acute kidney injury) ([place])    Tara Sill, NP-C 02/08/2020 9:56 AM

## 2020-02-08 NOTE — Consult Note (Signed)
Consultation Note Date: 02/08/2020   Patient Name: Tara [name]  DOB: 05-20-1918  MRN: [id number]  Age / Sex: 86 y.o., female  PCP: Einar Pheasant, MD Referring Physician: Flora Lipps, MD  Reason for Consultation: Establishing goals of care  HPI/Patient Profile: 86 y.o. female  with past medical history of asthma, HTN, CKD4, CVA, PVD, and T2DM admitted on 01/31/2020 with weakness, cough, and shortness of breath.  Found to have heme positive stools and hypertensive emergency. Also found to have acute on chronic CHF. Required 1 unit of PRBC transfusion and nicardipine drip for HTN. BP remains high but better controlled. Has not required any further transfusion. SNF has been recommended by PT. PMT consulted for [place].  Clinical Assessment and Goals of Care: I have reviewed medical records including EPIC notes, labs and imaging, assessed the patient and then met with patient's 3 children - Tara [name], and Tara [name],  to discuss diagnosis prognosis, GOC, EOL wishes, disposition and options.  I introduced Palliative Medicine as specialized medical care for people living with serious illness. It focuses on providing relief from the symptoms and stress of a serious illness. The goal is to improve quality of life for both the patient and the family.  They tell me about living situation - one daughter stays with patient all day and the other stays all night. Patient is never alone at home. Patient has 4 children but one is a truck driver and cannot be around as frequently. All children report some health problems and they are concerned about their ability to continue to care for Ms. Porco.   As far as functional and nutritional status, they tell me patient is ambulatory but has started requiring more assistance for ambulation. She spends most of her time sitting in a chair and uses a walker when she does walk. They tell me she has a poor appetite. They feel  that she is cognitively intact - they attribute some confusion in the hospital to her hearing loss.    We discussed patient's current illness and what it means in the larger context of patient's on-going co-morbidities. We discuss her HTN and heart failure. We discuss her decline in functional status and poor PO intake.   I attempted to elicit values and goals of care important to the patient.  They all agree they are interested in aggressive measures such as ICU care or feeding tubes but are okay with limited interventions such as IV fluids and antibiotics.   They would like the patient to go to rehab as they would like to see if she is able to regain some level of function prior to returning home. We discuss options for care at home follow rehab - discussed hospice vs home health in detail. Discussed shifting focus from rehabilitation to comfort when is ready for hospice services.   Advance directives, concepts specific to code status, artificial feeding and hydration, and rehospitalization were considered and discussed. We did complete a MOST form with the following selections: DNR, limited additional interventions, transfer to hospital if indicated but avoid ICU, antibiotics if indicated, IV fluids if indicated, NO feeding tube.  Discussed with family the importance of continued conversation with family and the medical providers regarding overall plan of care and treatment options, ensuring decisions are within the context of the patient's values and GOCs.    Hospice and Palliative Care services outpatient were explained and offered. Discussed that palliative care could follow up at SNF - family is agreeable. We discussed potential  transition to hospice following rehab depending on how rehab goes.   Questions and concerns were addressed. The family was encouraged to call with questions or concerns.   Primary Decision Maker NEXT OF KIN - majority of patient's children, no designated HCPOA     SUMMARY OF RECOMMENDATIONS   - MOST completed - detailed above - to SNF with palliative  Code Status/Advance Care Planning:  DNR   Additional Recommendations (Limitations, Scope, Preferences):  Initiate Comfort Feeding, No Artificial Feeding and No Tracheostomy  Prognosis:   Unable to determine  Discharge Planning: [facility] for rehab with Palliative care service follow-up      Primary Diagnoses: Present on Admission: . GI bleeding . CKD (chronic kidney disease) stage 4, GFR 15-29 ml/min (HCC) . Diabetes mellitus with peripheral vascular disease ([place])   I have reviewed the medical record, interviewed the patient and family, and examined the patient. The following aspects are pertinent.  Past Medical History:  Diagnosis Date  . Asthma   . Diabetes mellitus ([place])   . Environmental allergies   . Hypertension    Social History   Socioeconomic History  . Marital status: Widowed    Spouse name: Not on file  . Number of children: Not on file  . Years of education: Not on file  . Highest education level: Not on file  Occupational History  . Not on file  Tobacco Use  . Smoking status: Never Smoker  . Smokeless tobacco: Never Used  Vaping Use  . Vaping Use: Never used  Substance and Sexual Activity  . Alcohol use: No    Alcohol/week: 0.0 standard drinks  . Drug use: No  . Sexual activity: Not on file  Other Topics Concern  . Not on file  Social History Narrative  . Not on file   Social Determinants of Health   Financial Resource Strain:   . Difficulty of Paying Living Expenses:   Food Insecurity:   . Worried About Charity fundraiser in the Last Year:   . Arboriculturist in the Last Year:   Transportation Needs:   . Film/video editor (Medical):   Marland Kitchen Lack of Transportation (Non-Medical):   Physical Activity:   . Days of Exercise per Week:   . Minutes of Exercise per Session:   Stress:   . Feeling of Stress :   Social Connections:    . Frequency of Communication with Friends and Family:   . Frequency of Social Gatherings with Friends and Family:   . Attends Religious Services:   . Active Member of Clubs or Organizations:   . Attends Archivist Meetings:   Marland Kitchen Marital Status:    Family History  Problem Relation Age of Onset  . Hypertension Mother   . Cancer Brother        lung (question)  . Cancer Sister        questionable type   Scheduled Meds: . Chlorhexidine Gluconate Cloth  6 each Topical Daily  . cloNIDine  0.1 mg Oral TID  . doxazosin  1 mg Oral Daily  . epoetin (EPOGEN/PROCRIT) injection  3,000 Units Subcutaneous Q14 Days  . feeding supplement (ENSURE ENLIVE)  237 mL Oral TID BM  . hydrALAZINE  100 mg Oral Q8H  . insulin aspart  0-5 Units Subcutaneous TID WC  . labetalol  100 mg Oral BID  . levothyroxine  50 mcg Oral Q0600  . loratadine  10 mg Oral Daily  . minoxidil  5 mg Oral Daily  . multivitamin with minerals  1 tablet Oral Daily  . NIFEdipine  60 mg Oral Daily  . pantoprazole  40 mg Intravenous Q12H   Continuous Infusions: PRN Meds:.acetaminophen **OR** acetaminophen, labetalol, ondansetron **OR** ondansetron (ZOFRAN) IV, traZODone Allergies  Allergen Reactions  . Ace Inhibitors Other (See Comments)    Angioedema  . Lisinopril Swelling and Other (See Comments)  . Hydrochlorothiazide W-Triamterene Other (See Comments)    Gout flare  . Penicillins Swelling    Has patient had a PCN reaction causing immediate rash, facial/tongue/throat swelling, SOB or lightheadedness with hypotension: Unknown Has patient had a PCN reaction causing severe rash involving mucus membranes or skin necrosis: Unknown Has patient had a PCN reaction that required hospitalization: Unknown Has patient had a PCN reaction occurring within the last 10 years: No If all of the above answers are "NO", then may proceed with Cephalosporin use.  Other reaction(s): Unknown Has patient had   . Trichlormethiazide  Other (See Comments)    Gout flare   Review of Systems  All other systems reviewed and are negative.   Mild leg pain - better today than before, no other complaints  Physical Exam Constitutional:      General: She is not in acute distress.    Comments: HOH, seems mildly confused but family attributes this to [facility]  Pulmonary:     Effort: Pulmonary effort is normal.  Musculoskeletal:     Right lower leg: Edema present.     Left lower leg: Edema present.  Skin:    General: Skin is warm and dry.  Neurological:     Mental Status: She is alert.     Comments: Difficult to assess orientation d/t HOH     Vital Signs: BP (!) 147/53 (BP Location: Left Arm)   Pulse 73   Temp 99 F (37.2 C) (Oral)   Resp 16   Ht 4' 10"  (1.473 m)   Wt 36.6 kg   SpO2 100%   BMI 16.86 kg/m  Pain Scale: 0-10 POSS *See Group Information*: 1-Acceptable,Awake and alert Pain Score: 0-No pain   SpO2: SpO2: 100 % O2 Device:SpO2: 100 % O2 Flow Rate: .   IO: Intake/output summary: No intake or output data in the 24 hours ending 02/08/20 1149  LBM: Last BM Date: 02/05/20 Baseline Weight: Weight: 37.2 kg Most recent weight: Weight: 36.6 kg     Palliative Assessment/Data: PPS 40%    Time Total: 80 minutes Greater than 50%  of this time was spent counseling and coordinating care related to the above assessment and plan.  Juel Burrow, DNP, AGNP-C Palliative Medicine Team [phone] Pager: [phone]

## 2020-02-08 NOTE — Progress Notes (Signed)
[place] attempted visit as follow-up from prior visits on ICU; pt. asleep in bed.  Will try to follow up later this week.    02/08/20 1600  Clinical Encounter Type  Visited With Patient not available

## 2020-02-08 NOTE — NC FL2 (Signed)
[place] LEVEL OF CARE SCREENING TOOL     IDENTIFICATION  Patient Name: Tara [name] Birthdate: 1918/04/15 Sex: female Admission Date (Current Location): 01/31/2020  [place] and [place] Number:  Engineering geologist and Address:  [facility], [address] [place], [place] [address]      Provider Number: [id number]  Attending Physician Name and Address:  Flora Lipps, MD  Relative Name and Phone Number:  [name]  [phone]    Current Level of Care: SNF Recommended Level of Care: [place] Prior Approval Number:    Date Approved/Denied:   PASRR Number: [id number]  Discharge Plan: SNF    Current Diagnoses: Patient Active Problem List   Diagnosis Date Noted  . AKI (acute kidney injury) ([place]) 02/04/2020  . Pressure injury of skin 02/01/2020  . GI bleeding 01/31/2020  . CKD (chronic kidney disease) stage 4, GFR 15-29 ml/min (HCC) 07/09/2019  . Elevated serum creatinine 05/26/2019  . Decreased GFR 05/26/2019  . Cerumen impaction 11/09/2018  . Wound infection after surgery 05/14/2018  . Chronic constipation 04/02/2018  . GERD without esophagitis 04/02/2018  . Abdominal pain 03/02/2018  . Hypertensive urgency, malignant 03/02/2018  . Difficulty breathing 11/03/2017  . Knee effusion, right 11/03/2017  . Fracture of superior rim of left pubis, subsequent encounter for fracture with routine healing 11/03/2017  . History of falling 11/03/2017  . Protein-calorie malnutrition, severe 11/01/2017  . Hypertensive urgency 10/31/2017  . Bilateral lower extremity edema 09/07/2017  . Right hand pain 08/25/2017  . Leg pain 08/25/2017  . Right groin hernia 07/03/2017  . Abnormal CT of the abdomen 07/03/2017  . Allergic rhinitis 06/17/2017  . Osteoporosis 03/12/2017  . Nasal obstruction 09/03/2016  . Loss of weight 03/05/2015  . Lower extremity edema 05/15/2013  . Peripheral vascular disease ([place]) 08/29/2012  .  Hypertension 06/11/2012  . Hypercholesteremia 06/11/2012  . Diabetes mellitus with peripheral vascular disease ([place]) 06/11/2012  . Anemia 06/11/2012  . B12 deficiency 06/11/2012  . Hypothyroidism 06/11/2012    Orientation RESPIRATION BLADDER Height & Weight     Self, Time, Situation, Place  Normal Incontinent Weight: 36.6 kg Height:  4\' 10"  (147.3 cm)  BEHAVIORAL SYMPTOMS/MOOD NEUROLOGICAL BOWEL NUTRITION STATUS      Continent    AMBULATORY STATUS COMMUNICATION OF NEEDS Skin   Extensive Assist Verbally Normal                       Personal Care Assistance Level of Assistance  Bathing, Feeding, Dressing, Total care Bathing Assistance: Limited assistance Feeding assistance: Limited assistance Dressing Assistance: Maximum assistance Total Care Assistance: Limited assistance   Functional Limitations Info             SPECIAL CARE FACTORS FREQUENCY  PT (By licensed PT), OT (By licensed OT)     PT Frequency: 5x a week OT Frequency: 5x a week            Contractures Contractures Info: Not present    Additional Factors Info  Code Status, Allergies Code Status Info: DNR Allergies Info: Ace inhibitors, lipitor, hydrochlorithiazide, pencillin, trichlormethiazide           Current Medications (02/08/2020):  This is the current hospital active medication list Current Facility-Administered Medications  Medication Dose Route Frequency Provider Last Rate Last Admin  . acetaminophen (TYLENOL) tablet 650 mg  650 mg Oral Q6H PRN Mansy, Arvella Merles, MD   650 mg at 02/08/20 3875   Or  .  acetaminophen (TYLENOL) suppository 650 mg  650 mg Rectal Q6H PRN Mansy, Arvella Merles, MD      . Chlorhexidine Gluconate Cloth 2 % PADS 6 each  6 each Topical Daily Jennye Boroughs, MD   6 each at 02/07/20 1006  . cloNIDine (CATAPRES) tablet 0.1 mg  0.1 mg Oral TID Mansy, Jan A, MD   0.1 mg at 02/08/20 0926  . doxazosin (CARDURA) tablet 1 mg  1 mg Oral Daily Jennye Boroughs, MD   1 mg at 02/08/20 4010  .  epoetin alfa (EPOGEN) injection 3,000 Units  3,000 Units Subcutaneous Q14 Days Bhutani, Manpreet S, MD      . feeding supplement (ENSURE ENLIVE) (ENSURE ENLIVE) liquid 237 mL  237 mL Oral TID BM Jennye Boroughs, MD   237 mL at 02/08/20 0959  . hydrALAZINE (APRESOLINE) tablet 100 mg  100 mg Oral Q8H Bhutani, Manpreet S, MD   100 mg at 02/08/20 0617  . insulin aspart (novoLOG) injection 0-5 Units  0-5 Units Subcutaneous TID WC Jennye Boroughs, MD   1 Units at 02/08/20 1317  . labetalol (NORMODYNE) injection 20 mg  20 mg Intravenous Q3H PRN Jennye Boroughs, MD   20 mg at 02/07/20 0909  . labetalol (NORMODYNE) tablet 100 mg  100 mg Oral BID Jennye Boroughs, MD   100 mg at 02/08/20 0927  . levothyroxine (SYNTHROID) tablet 50 mcg  50 mcg Oral Q0600 Jennye Boroughs, MD   50 mcg at 02/08/20 0615  . loratadine (CLARITIN) tablet 10 mg  10 mg Oral Daily Mansy, Jan A, MD   10 mg at 02/08/20 2725  . minoxidil (LONITEN) tablet 5 mg  5 mg Oral Daily Bhutani, Manpreet S, MD   5 mg at 02/08/20 0951  . multivitamin with minerals tablet 1 tablet  1 tablet Oral Daily Jennye Boroughs, MD   1 tablet at 02/08/20 [phone]  . NIFEdipine (ADALAT CC) 24 hr tablet 60 mg  60 mg Oral Daily Bhutani, Manpreet S, MD   60 mg at 02/08/20 0950  . ondansetron (ZOFRAN) tablet 4 mg  4 mg Oral Q6H PRN Mansy, Jan A, MD       Or  . ondansetron [facility]) injection 4 mg  4 mg Intravenous Q6H PRN Mansy, Jan A, MD      . pantoprazole (PROTONIX) injection 40 mg  40 mg Intravenous Q12H Mansy, Jan A, MD   40 mg at 02/08/20 0958  . traZODone (DESYREL) tablet 25 mg  25 mg Oral QHS PRN Mansy, Jan A, MD   25 mg at 02/03/20 0022     Discharge Medications: Please see discharge summary for a list of discharge medications.  Relevant Imaging Results:  Relevant Lab Results:   Additional Information ss: [id number]  Victorino Dike, RN

## 2020-02-08 NOTE — Progress Notes (Signed)
Physical Therapy Treatment Patient Details Name: Tara [name] MRN: [id number] DOB: [birth year] Today's Date: 02/08/2020    History of Present Illness Pt is a 86 yo female that presented to ED for hyptensive emergency, also heme positive stool without indication of GI bleed, has recieved a transfusion this hospital stay. PMH of asthma, HTN, DMII, hypothyroidsm, stage IV CKD.    PT Comments    Pt was sleeping, easily woken. Reported no pain, but that she feels weak. Supine to sit with modA and use of bed rails, able to progress to fair sitting balance with UE and LE supported. Pt performed seated therapeutic exercises with AAROM for LAQ, AROM heel/toe raises, tactile and verbal cueing throughout. Sit <> Stand with RW and modA, and stand pivot with RW minA, but total assist for RW management. Pt up in chair with all needs in reach at end of session. The patient would benefit from further skilled PT intervention to continue to progress towards goals. Recommendation remains appropriate.      Follow Up Recommendations  SNF     Equipment Recommendations  Wheelchair (measurements PT)    Recommendations for Other Services OT consult     Precautions / Restrictions Precautions Precautions: Fall Restrictions Weight Bearing Restrictions: No    Mobility  Bed Mobility Overal bed mobility: Needs Assistance Bed Mobility: Supine to Sit     Supine to sit: Mod assist        Transfers Overall transfer level: Needs assistance Equipment used: Rolling walker (2 wheeled) Transfers: Sit to/from [facility] Sit to Stand: Mod assist Stand pivot transfers: Min assist       General transfer comment: PT total assist for RW management  Ambulation/Gait                 Stairs             Wheelchair Mobility    Modified Rankin (Stroke Patients Only)       Balance Overall balance assessment: Needs assistance Sitting-balance support: Feet supported Sitting  balance-Leahy Scale: Fair     Standing balance support: Bilateral upper extremity supported Standing balance-Leahy Scale: Poor Standing balance comment: requires support to achieve and maintain static standing balance                            Cognition Arousal/Alertness: Awake/alert Behavior During Therapy: WFL for tasks assessed/performed Overall Cognitive Status: Within Functional Limits for tasks assessed                                 General Comments: pt is alert and oriented to self, place and situation, able to follow commands pt with improved speech this session, much clearer      Exercises Other Exercises Other Exercises: seated LAQ AAROM and toe/heel raises AROM x10 ea LE    General Comments        Pertinent Vitals/Pain Pain Assessment: No/denies pain    Home Living                      Prior Function            PT Goals (current goals can now be found in the care plan section) Progress towards PT goals: Progressing toward goals    Frequency    Min 2X/week      PT Plan Current plan remains appropriate  Co-evaluation              AM-PAC PT "6 Clicks" Mobility   Outcome Measure  Help needed turning from your back to your side while in a flat bed without using bedrails?: A Little Help needed moving from lying on your back to sitting on the side of a flat bed without using bedrails?: A Little Help needed moving to and from a bed to a chair (including a wheelchair)?: A Little Help needed standing up from a chair using your arms (e.g., wheelchair or bedside chair)?: A Lot Help needed to walk in hospital room?: A Lot Help needed climbing 3-5 steps with a railing? : Total 6 Click Score: 14    End of Session Equipment Utilized During Treatment: Gait belt Activity Tolerance: Patient tolerated treatment well Patient left: in chair;with call bell/phone within reach Nurse Communication: Mobility status PT Visit  Diagnosis: Other abnormalities of gait and mobility (R26.89);Muscle weakness (generalized) (M62.81);Difficulty in walking, not elsewhere classified (R26.2)     Time: 5929-2446 PT Time Calculation (min) (ACUTE ONLY): 17 min  Charges:  $Therapeutic Exercise: 8-22 mins                     Lieutenant Diego PT, DPT 11:32 AM,02/08/20

## 2020-02-09 DIAGNOSIS — R279 Unspecified lack of coordination: Secondary | ICD-10-CM | POA: Diagnosis not present

## 2020-02-09 DIAGNOSIS — E43 Unspecified severe protein-calorie malnutrition: Secondary | ICD-10-CM | POA: Diagnosis not present

## 2020-02-09 DIAGNOSIS — Z8673 Personal history of transient ischemic attack (TIA), and cerebral infarction without residual deficits: Secondary | ICD-10-CM | POA: Diagnosis not present

## 2020-02-09 DIAGNOSIS — J810 Acute pulmonary edema: Secondary | ICD-10-CM | POA: Diagnosis not present

## 2020-02-09 DIAGNOSIS — I130 Hypertensive heart and chronic kidney disease with heart failure and stage 1 through stage 4 chronic kidney disease, or unspecified chronic kidney disease: Secondary | ICD-10-CM | POA: Diagnosis not present

## 2020-02-09 DIAGNOSIS — I10 Essential (primary) hypertension: Secondary | ICD-10-CM | POA: Diagnosis not present

## 2020-02-09 DIAGNOSIS — M25561 Pain in right knee: Secondary | ICD-10-CM | POA: Diagnosis not present

## 2020-02-09 DIAGNOSIS — E1151 Type 2 diabetes mellitus with diabetic peripheral angiopathy without gangrene: Secondary | ICD-10-CM | POA: Diagnosis not present

## 2020-02-09 DIAGNOSIS — E119 Type 2 diabetes mellitus without complications: Secondary | ICD-10-CM | POA: Diagnosis not present

## 2020-02-09 DIAGNOSIS — R1311 Dysphagia, oral phase: Secondary | ICD-10-CM | POA: Diagnosis not present

## 2020-02-09 DIAGNOSIS — K922 Gastrointestinal hemorrhage, unspecified: Secondary | ICD-10-CM | POA: Diagnosis not present

## 2020-02-09 DIAGNOSIS — K219 Gastro-esophageal reflux disease without esophagitis: Secondary | ICD-10-CM | POA: Diagnosis not present

## 2020-02-09 DIAGNOSIS — D631 Anemia in chronic kidney disease: Secondary | ICD-10-CM | POA: Diagnosis not present

## 2020-02-09 DIAGNOSIS — E1122 Type 2 diabetes mellitus with diabetic chronic kidney disease: Secondary | ICD-10-CM | POA: Diagnosis not present

## 2020-02-09 DIAGNOSIS — M810 Age-related osteoporosis without current pathological fracture: Secondary | ICD-10-CM | POA: Diagnosis not present

## 2020-02-09 DIAGNOSIS — N184 Chronic kidney disease, stage 4 (severe): Secondary | ICD-10-CM | POA: Diagnosis not present

## 2020-02-09 DIAGNOSIS — M25562 Pain in left knee: Secondary | ICD-10-CM | POA: Diagnosis not present

## 2020-02-09 DIAGNOSIS — I5031 Acute diastolic (congestive) heart failure: Secondary | ICD-10-CM | POA: Diagnosis not present

## 2020-02-09 DIAGNOSIS — R5381 Other malaise: Secondary | ICD-10-CM | POA: Diagnosis not present

## 2020-02-09 DIAGNOSIS — E039 Hypothyroidism, unspecified: Secondary | ICD-10-CM | POA: Diagnosis not present

## 2020-02-09 DIAGNOSIS — J45909 Unspecified asthma, uncomplicated: Secondary | ICD-10-CM | POA: Diagnosis not present

## 2020-02-09 DIAGNOSIS — I5032 Chronic diastolic (congestive) heart failure: Secondary | ICD-10-CM | POA: Diagnosis not present

## 2020-02-09 DIAGNOSIS — E538 Deficiency of other specified B group vitamins: Secondary | ICD-10-CM | POA: Diagnosis not present

## 2020-02-09 DIAGNOSIS — M6281 Muscle weakness (generalized): Secondary | ICD-10-CM | POA: Diagnosis not present

## 2020-02-09 DIAGNOSIS — Z79899 Other long term (current) drug therapy: Secondary | ICD-10-CM | POA: Diagnosis not present

## 2020-02-09 DIAGNOSIS — D649 Anemia, unspecified: Secondary | ICD-10-CM | POA: Diagnosis not present

## 2020-02-09 DIAGNOSIS — E7800 Pure hypercholesterolemia, unspecified: Secondary | ICD-10-CM | POA: Diagnosis not present

## 2020-02-09 DIAGNOSIS — N179 Acute kidney failure, unspecified: Secondary | ICD-10-CM | POA: Diagnosis not present

## 2020-02-09 LAB — BASIC METABOLIC PANEL
Anion gap: 5 (ref 5–15)
BUN: 93 mg/dL — ABNORMAL HIGH (ref 8–23)
CO2: 25 mmol/L (ref 22–32)
Calcium: 7.7 mg/dL — ABNORMAL LOW (ref 8.9–10.3)
Chloride: 111 mmol/L (ref 98–111)
Creatinine, Ser: 2.47 mg/dL — ABNORMAL HIGH (ref 0.44–1.00)
GFR calc Af Amer: 18 mL/min — ABNORMAL LOW (ref >60)
GFR calc non Af Amer: 16 mL/min — ABNORMAL LOW (ref >60)
Glucose, Bld: 138 mg/dL — ABNORMAL HIGH (ref 70–99)
Potassium: 4.5 mmol/L (ref 3.5–5.1)
Sodium: 141 mmol/L (ref 135–145)

## 2020-02-09 LAB — CBC
HCT: 23.4 % — ABNORMAL LOW (ref 36.0–46.0)
Hemoglobin: 8 g/dL — ABNORMAL LOW (ref 12.0–15.0)
MCH: 32.5 pg (ref 26.0–34.0)
MCHC: 34.2 g/dL (ref 30.0–36.0)
MCV: 95.1 fL (ref 80.0–100.0)
Platelets: 186 10*3/uL (ref 150–400)
RBC: 2.46 MIL/uL — ABNORMAL LOW (ref 3.87–5.11)
RDW: 16.6 % — ABNORMAL HIGH (ref 11.5–15.5)
WBC: 4.8 10*3/uL (ref 4.0–10.5)
nRBC: 0 % (ref 0.0–0.2)

## 2020-02-09 LAB — SARS CORONAVIRUS 2 BY RT PCR (HOSPITAL ORDER, PERFORMED IN ~~LOC~~ HOSPITAL LAB): SARS Coronavirus 2: NEGATIVE

## 2020-02-09 LAB — GLUCOSE, CAPILLARY
Glucose-Capillary: 124 mg/dL — ABNORMAL HIGH (ref 70–99)
Glucose-Capillary: 144 mg/dL — ABNORMAL HIGH (ref 70–99)

## 2020-02-09 MED ORDER — TRAZODONE HCL 50 MG PO TABS: 25.0000 mg | ORAL_TABLET | Freq: Every evening | ORAL | Status: AC | PRN

## 2020-02-09 MED ORDER — DOXAZOSIN MESYLATE 1 MG PO TABS: 1.0000 mg | ORAL_TABLET | Freq: Every day | ORAL | Status: AC

## 2020-02-09 MED ORDER — ENSURE ENLIVE PO LIQD: 237.0000 mL | Freq: Three times a day (TID) | ORAL | Status: AC

## 2020-02-09 MED ORDER — MINOXIDIL 2.5 MG PO TABS: 5.0000 mg | ORAL_TABLET | Freq: Every day | ORAL | Status: DC

## 2020-02-09 MED ORDER — ADULT MULTIVITAMIN W/MINERALS CH: 1.0000 | ORAL_TABLET | Freq: Every day | ORAL | Status: AC

## 2020-02-09 MED ORDER — PANTOPRAZOLE SODIUM 40 MG PO TBEC
40.0000 mg | DELAYED_RELEASE_TABLET | Freq: Every day | ORAL | 0 refills | Status: AC
Start: 2020-02-09 — End: 2021-02-08

## 2020-02-09 MED ORDER — NIFEDIPINE ER 60 MG PO TB24: 60.0000 mg | ORAL_TABLET | Freq: Every day | ORAL | Status: AC

## 2020-02-09 NOTE — Discharge Instructions (Signed)
Anemia  Anemia is a condition in which you do not have enough red blood cells or hemoglobin. Hemoglobin is a substance in red blood cells that carries oxygen. When you do not have enough red blood cells or hemoglobin (are anemic), your body cannot get enough oxygen and your organs may not work properly. As a result, you may feel very tired or have other problems. What are the causes? Common causes of anemia include:  Excessive bleeding. Anemia can be caused by excessive bleeding inside or outside the body, including bleeding from the intestine or from periods in women.  Poor nutrition.  Long-lasting (chronic) kidney, thyroid, and liver disease.  Bone marrow disorders.  Cancer and treatments for cancer.  HIV (human immunodeficiency virus) and AIDS (acquired immunodeficiency syndrome).  Treatments for HIV and AIDS.  Spleen problems.  Blood disorders.  Infections, medicines, and autoimmune disorders that destroy red blood cells. What are the signs or symptoms? Symptoms of this condition include:  Minor weakness.  Dizziness.  Headache.  Feeling heartbeats that are irregular or faster than normal (palpitations).  Shortness of breath, especially with exercise.  Paleness.  Cold sensitivity.  Indigestion.  Nausea.  Difficulty sleeping.  Difficulty concentrating. Symptoms may occur suddenly or develop slowly. If your anemia is mild, you may not have symptoms. How is this diagnosed? This condition is diagnosed based on:  Blood tests.  Your medical history.  A physical exam.  Bone marrow biopsy. Your health care provider may also check your stool (feces) for blood and may do additional testing to look for the cause of your bleeding. You may also have other tests, including:  Imaging tests, such as a CT scan or MRI.  Endoscopy.  Colonoscopy. How is this treated? Treatment for this condition depends on the cause. If you continue to lose a lot of blood, you may  need to be treated at a hospital. Treatment may include:  Taking supplements of iron, vitamin S31, or folic acid.  Taking a hormone medicine (erythropoietin) that can help to stimulate red blood cell growth.  Having a blood transfusion. This may be needed if you lose a lot of blood.  Making changes to your diet.  Having surgery to remove your spleen. Follow these instructions at home:  Take over-the-counter and prescription medicines only as told by your health care provider.  Take supplements only as told by your health care provider.  Follow any diet instructions that you were given.  Keep all follow-up visits as told by your health care provider. This is important. Contact a health care provider if:  You develop new bleeding anywhere in the body. Get help right away if:  You are very weak.  You are short of breath.  You have pain in your abdomen or chest.  You are dizzy or feel faint.  You have trouble concentrating.  You have bloody or black, tarry stools.  You vomit repeatedly or you vomit up blood. Summary  Anemia is a condition in which you do not have enough red blood cells or enough of a substance in your red blood cells that carries oxygen (hemoglobin).  Symptoms may occur suddenly or develop slowly.  If your anemia is mild, you may not have symptoms.  This condition is diagnosed with blood tests as well as a medical history and physical exam. Other tests may be needed.  Treatment for this condition depends on the cause of the anemia. This information is not intended to replace advice given to you by  your health care provider. Make sure you discuss any questions you have with your health care provider. Document Revised: 07/10/2017 Document Reviewed: 08/29/2016 Elsevier Patient Education  [place].

## 2020-02-09 NOTE — Plan of Care (Signed)
  Problem: Education: Goal: Knowledge of General Education information will improve Description: Including pain rating scale, medication(s)/side effects and non-pharmacologic comfort measures Outcome: Adequate for Discharge   Problem: Health Behavior/Discharge Planning: Goal: Ability to manage health-related needs will improve Outcome: Adequate for Discharge   Problem: Clinical Measurements: Goal: Ability to maintain clinical measurements within normal limits will improve Outcome: Adequate for Discharge Goal: Will remain free from infection Outcome: Adequate for Discharge Goal: Diagnostic test results will improve Outcome: Adequate for Discharge Goal: Cardiovascular complication will be avoided Outcome: Adequate for Discharge   Problem: Activity: Goal: Risk for activity intolerance will decrease Outcome: Adequate for Discharge   Problem: Nutrition: Goal: Adequate nutrition will be maintained Outcome: Adequate for Discharge   Problem: Pain Managment: Goal: General experience of comfort will improve Outcome: Adequate for Discharge   Problem: Safety: Goal: Ability to remain free from injury will improve Outcome: Adequate for Discharge   Problem: Skin Integrity: Goal: Risk for impaired skin integrity will decrease Outcome: Adequate for Discharge

## 2020-02-09 NOTE — Progress Notes (Signed)
SUBJECTIVE: Patient resting comfortably with no active complaints   Vitals:   02/08/20 2342 02/09/20 0503 02/09/20 0822 02/09/20 1110  BP: (!) 181/63 (!) 200/69 (!) 189/71 (!) 160/63  Pulse: 86 78 73 67  Resp: (!) 24 20 18 16   Temp:  98.6 F (37 C) 98 F (36.7 C) 98.2 F (36.8 C)  TempSrc:  Oral Oral Oral  SpO2:  97% 97% 97%  Weight:      Height:       No intake or output data in the 24 hours ending 02/09/20 1428  LABS: Basic Metabolic Panel: Recent Labs    02/07/20 0800 02/09/20 0524  NA 140 141  K 4.4 4.5  CL 108 111  CO2 21* 25  GLUCOSE 46* 138*  BUN 87* 93*  CREATININE 2.52* 2.47*  CALCIUM 7.9* 7.7*   Liver Function Tests: No results for input(s): AST, ALT, ALKPHOS, BILITOT, PROT, ALBUMIN in the last 72 hours. No results for input(s): LIPASE, AMYLASE in the last 72 hours. CBC: Recent Labs    02/09/20 0524  WBC 4.8  HGB 8.0*  HCT 23.4*  MCV 95.1  PLT 186   Cardiac Enzymes: No results for input(s): CKTOTAL, CKMB, CKMBINDEX, TROPONINI in the last 72 hours. BNP: Invalid input(s): POCBNP D-Dimer: No results for input(s): DDIMER in the last 72 hours. Hemoglobin A1C: No results for input(s): HGBA1C in the last 72 hours. Fasting Lipid Panel: No results for input(s): CHOL, HDL, LDLCALC, TRIG, CHOLHDL, LDLDIRECT in the last 72 hours. Thyroid Function Tests: No results for input(s): TSH, T4TOTAL, T3FREE, THYROIDAB in the last 72 hours.  Invalid input(s): FREET3 Anemia Panel: No results for input(s): VITAMINB12, FOLATE, FERRITIN, TIBC, IRON, RETICCTPCT in the last 72 hours.   PHYSICAL EXAM General: Well developed, well nourished, in no acute distress HEENT:  Normocephalic and atramatic Neck:  No JVD.  Lungs: Clear bilaterally to auscultation and percussion. Heart: HRRR . Normal S1 and S2. III/VI systolic murmur Abdomen: Bowel sounds are positive, abdomen soft and non-tender  Msk:  Generalized weakness Extremities: No clubbing, cyanosis or edema.    Neuro: Alert and oriented X 3. Psych:  Good affect, responds appropriately  TELEMETRY: NSR 70s  ASSESSMENT AND PLAN: Uncontrolled hypertension with severe concentric LVH and diastolic dysfunction with hypertrophic cardiomyopathy. BP elevated again today. Will continue to monitor as the day progresses. Goal Systolic <003. Continue current regimen. Can have further management as an outpatient.   Active Problems:   Diabetes mellitus with peripheral vascular disease (HCC)   CKD (chronic kidney disease) stage 4, GFR 15-29 ml/min (HCC)   GI bleeding   Pressure injury of skin   AKI (acute kidney injury) ([place])   Acute pulmonary edema (HCC)   Chronic kidney disease   Goals of care, counseling/discussion   Palliative care by specialist    Adaline Sill, NP-C 02/09/2020 2:28 PM

## 2020-02-09 NOTE — Care Management Important Message (Signed)
Important Message  Patient Details  Name: Tara [name] MRN: [id number] Date of Birth: February 03, 1918   Medicare Important Message Given:  Yes     [name] 02/09/2020, 2:06 PM

## 2020-02-09 NOTE — Discharge Summary (Signed)
Physician Discharge Summary  Tara [name] YKZ:[id number] DOB: [birth year] DOA: 01/31/2020  PCP: Einar Pheasant, MD  Admit date: 01/31/2020 Discharge date: 02/09/2020  Admitted From: Home  Discharge disposition: SNF  Recommendations for Outpatient Follow-Up:   . Follow up with your primary care provider in 3 to 5 days . Check CBC, BMP, magnesium in the next visit . Patient has been referred for outpatient palliative care at the skilled nursing facility. . Patient has been started on multiple medications for hypertension.  Systolic blood pressure goal is 180 or less. . Patient did not wish to undergo endoscopic colonoscopic evaluation for GI bleed.  We will continue Protonix on discharge.  Aspirin has been on hold at this time.  Reassess the need for aspirin in the future. . Encourage oral nutrition.   Discharge Diagnosis:   Active Problems:   Diabetes mellitus with peripheral vascular disease (HCC)   CKD (chronic kidney disease) stage 4, GFR 15-29 ml/min (HCC)   GI bleeding   Pressure injury of skin   AKI (acute kidney injury) ([place])   Acute pulmonary edema (HCC)   Chronic kidney disease   Goals of care, counseling/discussion   Palliative care by specialist Severe protein calorie malnutrition.  Discharge Condition: Improved.  Diet recommendation: Low sodium, heart healthy.  Carbohydrate-modified.    Wound care: Continue pressure ulcer care.  Code status: DNR   History of Present Illness:   Tara [name] a 86 y.o.femalewith a known history ofasthma, hypertension and type 2 diabetes mellitus, who presented to the emergency room with acute onset of generalized weakness, cough, exertional shortness of breath.She was admitted to the stepdown unit for severe anemia, occult GI bleeding (positive heme stools), hypertensive emergency acute on chronic diastolic CHF. She was transfused with 1 unit of packed red blood cells and hemoglobin improved. She was treated with IV  Lasix for acute exacerbation of CHF. She required IV nicardipine infusion for hypertensive emergency. She developed AKI so IV Lasix was discontinued and she was treated with IV fluids. Nephrologist, gastroenterologist and cardiologist were consulted to assist with management. Her daughter did not want any invasive work-up. Minoxidil and doxazosin were added to antihypertensive regimen because of uncontrolled blood pressure. She was evaluated by PT and OT who recommended for discharge to SNF.   Hospital Course:   Following conditions were addressed during hospitalization as listed below,  Heme positive stool with severe anemia, anemia of chronic disease.  Status post 1 unit of packed RBC on 02/01/20.  Seen by GI in the management plan is conservative at this time. Family refused invasive procedure. Patient was started on Protonix which will be continued on discharge.  Hemoglobin however remained stable after initial transfusion..   Acute renal failure on chronic kidney disease stage IV with proteinuria: Baseline creatinine of 2.06, GFR of 19 on 08/24/19.  Nephrology on board.  No indication for hemodialysis.    Torsemide was initially kept on hold.  This will be continued on discharge.  Spoke with Dr. Juleen China nephrology about it.  New onset diastolic congestive heart failure with pulmonary edema likely secondary to hypertensive emergency.  Cardiology saw the patient during hospitalization. 2D echocardiogram with EF more than 75% with grade 1 diastolic dysfunction, LVH, moderately elevated pulmonary artery systolic pressure.  Multiple antihypertensive medications have been initiated in the hospital with systolic blood pressure goal less than 180  Essential Hypertension: Patient initially declined nicardipine drip for hypertensive urgency.  Subsequently patient was put on clonidine, doxazosin, hydralazine, labetalol, minoxidil,  nifedipine. PRN labetalol.  Nephrology recommending blood pressures goal  of systolic less than 944  Anemia of chronic kidney disease: Erythropoietin as per nephrology.  Hypothyroidism.  Continue Synthroid.  Type 2 diabetes mellitus.    Resume diabetic diet on discharge.Marland Kitchen  History of stroke.  Hold aspirin for now due to recent GI bleed and need for PRBC transfusion.  Reassess the need for aspirin in the future.  Sinus bradycardia.    Resolved.  Generalized weakness.  PT recommended skilled nursing facility placement on discharge.  Severe protein calorie malnutrition.  Present on admission.  Patient would benefit from nutritional supplements as outpatient.  Sacral decubitus ulceration present on admission stage II.  Continue wound care on discharge  Disposition.  At this time, patient is stable for disposition to skilled nursing facility. Spoke with the patient's daughter on the phone and updated her about the clinical condition of the patient and the plan for disposition.   Medical Consultants:    Palliative care  Nephrology  Cardiology  Procedures:    PRBC transfusion Subjective:   Today, patient feels okay.  Denies interval complaints.  Denies chest pain, shortness of breath, nausea, vomiting or GI bleed.  Discharge Exam:   Vitals:   02/09/20 0822 02/09/20 1110  BP: (!) 189/71 (!) 160/63  Pulse: 73 67  Resp: 18 16  Temp: 98 F (36.7 C) 98.2 F (36.8 C)  SpO2: 97% 97%   Vitals:   02/08/20 2342 02/09/20 0503 02/09/20 0822 02/09/20 1110  BP: (!) 181/63 (!) 200/69 (!) 189/71 (!) 160/63  Pulse: 86 78 73 67  Resp: (!) 24 20 18 16   Temp:  98.6 F (37 C) 98 F (36.7 C) 98.2 F (36.8 C)  TempSrc:  Oral Oral Oral  SpO2:  97% 97% 97%  Weight:      Height:       General: Alert awake, not in obvious distress, thinly built HENT: pupils equally reacting to light,  No scleral pallor or icterus noted. Oral mucosa is moist.  Chest:  Clear breath sounds.  Diminished breath sounds bilaterally. No crackles or wheezes.  CVS: S1 &S2  heard. No murmur.  Regular rate and rhythm. Abdomen: Soft, nontender, nondistended.  Bowel sounds are heard.   Extremities: No cyanosis, clubbing or edema.  Peripheral pulses are palpable. Psych: Alert, awake and oriented, normal mood CNS:  No cranial nerve deficits.  Moving all extremities. Skin: Warm and dry.  Stage II sacral decubitus ulceration present on admission  The results of significant diagnostics from this hospitalization (including imaging, microbiology, ancillary and laboratory) are listed below for reference.     Diagnostic Studies:   DG Chest 1 View  Result Date: 01/31/2020 CLINICAL DATA:  Weakness EXAM: CHEST  1 VIEW COMPARISON:  05/26/2018 FINDINGS: Small moderate bilateral pleural effusions. Basilar consolidations. Cardiomegaly with vascular congestion and diffuse interstitial and hazy lung opacity likely edema. Aortic atherosclerosis. No pneumothorax. IMPRESSION: Cardiomegaly with vascular congestion, pulmonary edema, and small to moderate bilateral pleural effusions. Basilar consolidations may reflect atelectasis or pneumonia. Electronically Signed   By: Donavan Foil M.D.   On: 01/31/2020 17:53   CT Head Wo Contrast  Result Date: 01/31/2020 CLINICAL DATA:  Encephalopathy. Additional history provided: Generalized weakness, mainly in both legs. EXAM: CT HEAD WITHOUT CONTRAST TECHNIQUE: Contiguous axial images were obtained from the base of the skull through the vertex without intravenous contrast. COMPARISON:  Head CT 06/12/2018 FINDINGS: Brain: Stable, mild generalized parenchymal atrophy. Redemonstrated chronic lacunar infarct within the posterior  limb of left internal capsule. Unchanged mild background ill-defined hypoattenuation within the cerebral white matter which is nonspecific, but consistent with chronic small vessel ischemic disease. There is no acute intracranial hemorrhage. No demarcated cortical infarct is identified. No extra-axial fluid collection. No evidence of  intracranial mass. No midline shift. Vascular: No hyperdense vessel.  Atherosclerotic calcifications. Skull: Normal. Negative for fracture or focal lesion. Sinuses/Orbits: Visualized orbits show no acute finding. Frothy secretions throughout the left sphenoid sinus. Minimal ethmoid sinus mucosal thickening. Prior postsurgical changes to the right mastoid air cells are questioned. There is a right mastoid effusion. The left mastoid air cells are clear at the imaged levels. IMPRESSION: No evidence of acute intracranial abnormality. Redemonstrated chronic lacunar infarct within the posterior limb of left internal capsule. Stable background mild generalized parenchymal atrophy and chronic small vessel ischemic disease. Paranasal sinus disease as described. Correlate for acute sinusitis. Right mastoid effusion. Electronically Signed   By: Kellie Simmering DO   On: 01/31/2020 17:45   ECHOCARDIOGRAM COMPLETE  Result Date: 02/01/2020    ECHOCARDIOGRAM REPORT   Patient Name:   PRESLEIGH FELDSTEIN Date of Exam: 01/31/2020 Medical Rec #:  [id number]      Height:       59.0 in Accession #:    [id number]     Weight:       82.0 lb Date of Birth:  [birth year]       BSA:          1.263 m Patient Age:    24 years       BP:           199/69 mmHg Patient Gender: F              HR:           63 bpm. Exam Location:  [facility] Procedure: 2D Echo, Cardiac Doppler and Color Doppler Indications:     Y07.37 Acute Diastolic CHF  History:         Patient has no prior history of Echocardiogram examinations.                  Risk Factors:Hypertension and Diabetes.  Sonographer:     Wilford Sports Rodgers-Jones Referring Phys:  [id number] Morris Diagnosing Phys: Neoma Laming MD IMPRESSIONS  1. Asymetricle septal hypertophy with IHSS. Left ventricular ejection fraction, by estimation, is >75%. The left ventricle has hyperdynamic function. The left ventricle has no regional wall motion abnormalities. There is severe asymmetric left ventricular hypertrophy of the  basal-septal segment. Left ventricular diastolic parameters are consistent with Grade I diastolic dysfunction (impaired relaxation).  2. Right ventricular systolic function is normal. The right ventricular size is normal. There is moderately elevated pulmonary artery systolic pressure.  3. Left atrial size was mildly dilated.  4. The mitral valve is normal in structure. Mild mitral valve regurgitation. No evidence of mitral stenosis.  5. The aortic valve is normal in structure. Aortic valve regurgitation is mild. Mild to moderate aortic valve sclerosis/calcification is present, without any evidence of aortic stenosis.  6. The inferior vena cava is normal in size with greater than 50% respiratory variability, suggesting right atrial pressure of 3 mmHg. Conclusion(s)/Recommendation(s): Findings consistent with hypertrophic obstructive cardiomyopathy. Due to the above findings, cardiology consult is recommended. FINDINGS  Left Ventricle: Asymetricle septal hypertophy with IHSS. Left ventricular ejection fraction, by estimation, is >75%. The left ventricle has hyperdynamic function. The left ventricle has no regional wall motion abnormalities. The left ventricular internal cavity  size was normal in size. There is severe asymmetric left ventricular hypertrophy of the basal-septal segment. Left ventricular diastolic parameters are consistent with Grade I diastolic dysfunction (impaired relaxation). Right Ventricle: The right ventricular size is normal. No increase in right ventricular wall thickness. Right ventricular systolic function is normal. There is moderately elevated pulmonary artery systolic pressure. The tricuspid regurgitant velocity is 3.16 m/s, and with an assumed right atrial pressure of 10 mmHg, the estimated right ventricular systolic pressure is 03.2 mmHg. Left Atrium: Left atrial size was mildly dilated. Right Atrium: Right atrial size was normal in size. Pericardium: There is no evidence of pericardial  effusion. Mitral Valve: The mitral valve is normal in structure. Normal mobility of the mitral valve leaflets. Mild mitral valve regurgitation. No evidence of mitral valve stenosis. Tricuspid Valve: The tricuspid valve is normal in structure. Tricuspid valve regurgitation is mild . No evidence of tricuspid stenosis. Aortic Valve: The aortic valve is normal in structure. Aortic valve regurgitation is mild. Mild to moderate aortic valve sclerosis/calcification is present, without any evidence of aortic stenosis. Pulmonic Valve: The pulmonic valve was normal in structure. Pulmonic valve regurgitation is mild. No evidence of pulmonic stenosis. Aorta: The aortic root is normal in size and structure. Venous: The inferior vena cava is normal in size with greater than 50% respiratory variability, suggesting right atrial pressure of 3 mmHg. IAS/Shunts: No atrial level shunt detected by color flow Doppler.  LEFT VENTRICLE PLAX 2D LVIDd:         3.14 cm  Diastology LVIDs:         1.68 cm  LV e' lateral:   3.59 cm/s LV PW:         1.57 cm  LV E/e' lateral: 29.2 LV IVS:        1.37 cm  LV e' medial:    5.00 cm/s LVOT diam:     1.80 cm  LV E/e' medial:  21.0 LV SV:         69 LV SV Index:   55 LVOT Area:     2.54 cm  RIGHT VENTRICLE             IVC RV Basal diam:  2.97 cm     IVC diam: 1.64 cm RV S prime:     12.10 cm/s TAPSE (M-mode): 2.4 cm LEFT ATRIUM             Index       RIGHT ATRIUM           Index LA diam:        3.70 cm 2.93 cm/m  RA Area:     11.00 cm LA Vol (A2C):   33.6 ml 26.61 ml/m RA Volume:   22.70 ml  17.98 ml/m LA Vol (A4C):   38.9 ml 30.81 ml/m LA Biplane Vol: 38.7 ml 30.65 ml/m  AORTIC VALVE LVOT Vmax:   87.50 cm/s LVOT Vmean:  72.000 cm/s LVOT VTI:    0.271 m  AORTA Ao Root diam: 2.80 cm MITRAL VALVE                TRICUSPID VALVE MV Area (PHT): 2.34 cm     TR Peak grad:   39.9 mmHg MV Decel Time: 324 msec     TR Vmax:        316.00 cm/s MV E velocity: 105.00 cm/s MV A velocity: 147.00 cm/s  SHUNTS  MV E/A ratio:  0.71  Systemic VTI:  0.27 m                             Systemic Diam: 1.80 cm Neoma Laming MD Electronically signed by Neoma Laming MD Signature Date/Time: 02/01/2020/9:12:25 AM    Final      Labs:   Basic Metabolic Panel: Recent Labs  Lab 02/04/20 0454 02/04/20 0454 02/05/20 4098 02/05/20 1191 02/06/20 4782 02/06/20 0852 02/07/20 0800 02/09/20 0524  NA 141  --  141  --  139  --  140 141  K 4.2   < > 4.0   < > 4.5   < > 4.4 4.5  CL 110  --  110  --  107  --  108 111  CO2 22  --  23  --  21*  --  21* 25  GLUCOSE 117*  --  63*  --  115*  --  46* 138*  BUN 82*  --  86*  --  86*  --  87* 93*  CREATININE 2.87*  --  2.73*  --  2.65*  --  2.52* 2.47*  CALCIUM 8.0*  --  7.8*  --  8.0*  --  7.9* 7.7*   < > = values in this interval not displayed.   GFR Estimated Creatinine Clearance: 7.2 mL/min (A) (by C-G formula based on SCr of 2.47 mg/dL (H)). Liver Function Tests: No results for input(s): AST, ALT, ALKPHOS, BILITOT, PROT, ALBUMIN in the last 168 hours. No results for input(s): LIPASE, AMYLASE in the last 168 hours. No results for input(s): AMMONIA in the last 168 hours. Coagulation profile No results for input(s): INR, PROTIME in the last 168 hours.  CBC: Recent Labs  Lab 02/03/20 0519 02/05/20 0311 02/06/20 0852 02/09/20 0524  WBC 5.0 4.4 5.8 4.8  NEUTROABS 3.8 3.5 4.6  --   HGB 8.0* 8.5* 9.1* 8.0*  HCT 23.9* 25.3* 27.1* 23.4*  MCV 96.0 95.1 95.4 95.1  PLT 173 185 220 186   Cardiac Enzymes: No results for input(s): CKTOTAL, CKMB, CKMBINDEX, TROPONINI in the last 168 hours. BNP: Invalid input(s): POCBNP CBG: Recent Labs  Lab 02/08/20 1134 02/08/20 1630 02/08/20 2143 02/09/20 0823 02/09/20 1112  GLUCAP 189* 148* 163* 124* 144*   D-Dimer No results for input(s): DDIMER in the last 72 hours. Hgb A1c No results for input(s): HGBA1C in the last 72 hours. Lipid Profile No results for input(s): CHOL, HDL, LDLCALC, TRIG, CHOLHDL, LDLDIRECT  in the last 72 hours. Thyroid function studies No results for input(s): TSH, T4TOTAL, T3FREE, THYROIDAB in the last 72 hours.  Invalid input(s): FREET3 Anemia work up No results for input(s): VITAMINB12, FOLATE, FERRITIN, TIBC, IRON, RETICCTPCT in the last 72 hours. Microbiology Recent Results (from the past 240 hour(s))  SARS Coronavirus 2 by RT PCR (hospital order, performed in [facility] hospital lab) Nasopharyngeal Nasopharyngeal Swab     Status: None   Collection Time: 01/31/20  7:11 PM   Specimen: Nasopharyngeal Swab  Result Value Ref Range Status   SARS Coronavirus 2 NEGATIVE NEGATIVE Final    Comment: (NOTE) SARS-CoV-2 target nucleic acids are NOT DETECTED.  The SARS-CoV-2 RNA is generally detectable in upper and lower respiratory specimens during the acute phase of infection. The lowest concentration of SARS-CoV-2 viral copies this assay can detect is 250 copies / mL. A negative result does not preclude SARS-CoV-2 infection and should not be used as the sole basis for treatment or  other patient management decisions.  A negative result may occur with improper specimen collection / handling, submission of specimen other than nasopharyngeal swab, presence of viral mutation(s) within the areas targeted by this assay, and inadequate number of viral copies (<250 copies / mL). A negative result must be combined with clinical observations, patient history, and epidemiological information.  Fact Sheet for Patients:   StrictlyIdeas.no  Fact Sheet for Healthcare Providers: BankingDealers.co.za  This test is not yet approved or  cleared by the [place] FDA and has been authorized for detection and/or diagnosis of SARS-CoV-2 by FDA under an Emergency Use Authorization (EUA).  This EUA will remain in effect (meaning this test can be used) for the duration of the COVID-19 declaration under Section 564(b)(1) of the Act, 21  U.S.C. section 360bbb-3(b)(1), unless the authorization is terminated or revoked sooner.  Performed at [facility], [address] [place], [place] [address]   SARS Coronavirus 2 by RT PCR (hospital order, performed in [facility] hospital lab) Nasopharyngeal Nasopharyngeal Swab     Status: None   Collection Time: 02/09/20 10:20 AM   Specimen: Nasopharyngeal Swab  Result Value Ref Range Status   SARS Coronavirus 2 NEGATIVE NEGATIVE Final    Comment: (NOTE) SARS-CoV-2 target nucleic acids are NOT DETECTED.  The SARS-CoV-2 RNA is generally detectable in upper and lower respiratory specimens during the acute phase of infection. The lowest concentration of SARS-CoV-2 viral copies this assay can detect is 250 copies / mL. A negative result does not preclude SARS-CoV-2 infection and should not be used as the sole basis for treatment or other patient management decisions.  A negative result may occur with improper specimen collection / handling, submission of specimen other than nasopharyngeal swab, presence of viral mutation(s) within the areas targeted by this assay, and inadequate number of viral copies (<250 copies / mL). A negative result must be combined with clinical observations, patient history, and epidemiological information.  Fact Sheet for Patients:   StrictlyIdeas.no  Fact Sheet for Healthcare Providers: BankingDealers.co.za  This test is not yet approved or  cleared by the [place] FDA and has been authorized for detection and/or diagnosis of SARS-CoV-2 by FDA under an Emergency Use Authorization (EUA).  This EUA will remain in effect (meaning this test can be used) for the duration of the COVID-19 declaration under Section 564(b)(1) of the Act, 21 U.S.C. section 360bbb-3(b)(1), unless the authorization is terminated or revoked sooner.  Performed at [facility], [address],  [place], [place] [address]      Discharge Instructions:   Discharge Instructions    Amb Referral to Palliative Care   Complete by: As directed    Diet - low sodium heart healthy   Complete by: As directed    Discharge instructions   Complete by: As directed    Follow-up with your primary care provider at the skilled nursing facility in 3 to 5 days.  Systolic blood pressure goal is 180 or less.  You have been taken off the aspirin for now due to recent significant anemia and GI bleed.  Please discuss with your primary care provider about the benefits of resuming it.   Discharge wound care:   Complete by: As directed    As per wound care recommendations   Increase activity slowly   Complete by: As directed      Allergies as of 02/09/2020      Reactions   Ace Inhibitors Other (See Comments)   Angioedema  Lisinopril Swelling, Other (See Comments)   Hydrochlorothiazide W-triamterene Other (See Comments)   Gout flare   Penicillins Swelling   Has patient had a PCN reaction causing immediate rash, facial/tongue/throat swelling, SOB or lightheadedness with hypotension: Unknown Has patient had a PCN reaction causing severe rash involving mucus membranes or skin necrosis: Unknown Has patient had a PCN reaction that required hospitalization: Unknown Has patient had a PCN reaction occurring within the last 10 years: No If all of the above answers are "NO", then may proceed with Cephalosporin use. Other reaction(s): Unknown Has patient had    Trichlormethiazide Other (See Comments)   Gout flare      Medication List    STOP taking these medications   amLODipine 10 MG tablet Commonly known as: NORVASC   Aspirin 81 81 MG EC tablet Generic drug: aspirin     TAKE these medications   Accu-Chek Aviva Plus test strip Generic drug: glucose blood CHECK SUGAR ONCE DAILY   Accu-Chek Softclix Lancets lancets Use as instructed to check blood sugars daily. Dx E11.9   acetaminophen 325 MG  tablet Commonly known as: TYLENOL Take 650 mg by mouth every 6 (six) hours as needed for mild pain.   CALTRATE 600+D PO Take 1 tablet by mouth daily.   cloNIDine 0.1 MG tablet Commonly known as: CATAPRES TAKE 1 TABLET BY MOUTH 3 TIMES DAILY.   doxazosin 1 MG tablet Commonly known as: CARDURA Take 1 tablet (1 mg total) by mouth daily.   feeding supplement (ENSURE ENLIVE) Liqd Take 237 mLs by mouth 3 (three) times daily between meals.   fexofenadine 180 MG tablet Commonly known as: ALLEGRA TAKE 1 TABLET BY MOUTH EVERY DAY What changed: additional instructions   hydrALAZINE 100 MG tablet Commonly known as: APRESOLINE Take 1 tablet (100 mg total) by mouth 3 (three) times daily.   labetalol 100 MG tablet Commonly known as: NORMODYNE TAKE 1 TABLET BY MOUTH TWICE A DAY   minoxidil 2.5 MG tablet Commonly known as: LONITEN Take 2 tablets (5 mg total) by mouth daily.   multivitamin with minerals Tabs tablet Take 1 tablet by mouth daily. Start taking on: February 10, 2020   NIFEdipine 60 MG 24 hr tablet Commonly known as: ADALAT CC Take 1 tablet (60 mg total) by mouth daily.   pantoprazole 40 MG tablet Commonly known as: Protonix Take 1 tablet (40 mg total) by mouth daily.   Pen Needles 32G X 4 MM Misc 1 application by Does not apply route daily.   Synthroid 50 MCG tablet Generic drug: levothyroxine TAKE 1 TABLET BY MOUTH EVERY DAY What changed:   how much to take  when to take this   torsemide 20 MG tablet Commonly known as: DEMADEX Take 20 mg by mouth daily.   traZODone 50 MG tablet Commonly known as: DESYREL Take 0.5 tablets (25 mg total) by mouth at bedtime as needed for sleep.            Discharge Care Instructions  (From admission, onward)         Start     Ordered   02/09/20 0000  Discharge wound care:       Comments: As per wound care recommendations   02/09/20 1156          Contact information for after-discharge care    [facility] .   Service: Skilled Chiropodist information: [facility] [place] [phone]  Time coordinating discharge: 39 minutes  Signed:  Javonn Gauger  [facility] 02/09/2020, 11:56 AM

## 2020-02-10 DIAGNOSIS — E039 Hypothyroidism, unspecified: Secondary | ICD-10-CM | POA: Diagnosis not present

## 2020-02-10 DIAGNOSIS — I10 Essential (primary) hypertension: Secondary | ICD-10-CM | POA: Diagnosis not present

## 2020-02-10 DIAGNOSIS — D649 Anemia, unspecified: Secondary | ICD-10-CM | POA: Diagnosis not present

## 2020-02-10 DIAGNOSIS — Z79899 Other long term (current) drug therapy: Secondary | ICD-10-CM | POA: Diagnosis not present

## 2020-02-10 DIAGNOSIS — E119 Type 2 diabetes mellitus without complications: Secondary | ICD-10-CM | POA: Diagnosis not present

## 2020-02-14 DIAGNOSIS — N184 Chronic kidney disease, stage 4 (severe): Secondary | ICD-10-CM | POA: Diagnosis not present

## 2020-02-14 DIAGNOSIS — I10 Essential (primary) hypertension: Secondary | ICD-10-CM | POA: Diagnosis not present

## 2020-02-14 DIAGNOSIS — D649 Anemia, unspecified: Secondary | ICD-10-CM | POA: Diagnosis not present

## 2020-02-14 DIAGNOSIS — E1151 Type 2 diabetes mellitus with diabetic peripheral angiopathy without gangrene: Secondary | ICD-10-CM | POA: Diagnosis not present

## 2020-02-14 DIAGNOSIS — E43 Unspecified severe protein-calorie malnutrition: Secondary | ICD-10-CM | POA: Diagnosis not present

## 2020-02-18 ENCOUNTER — Other Ambulatory Visit: Payer: Self-pay | Admitting: Internal Medicine

## 2020-02-23 DIAGNOSIS — M25562 Pain in left knee: Secondary | ICD-10-CM | POA: Diagnosis not present

## 2020-02-23 DIAGNOSIS — M25561 Pain in right knee: Secondary | ICD-10-CM | POA: Diagnosis not present

## 2020-02-27 ENCOUNTER — Telehealth: Payer: Self-pay | Admitting: Internal Medicine

## 2020-02-27 NOTE — Telephone Encounter (Signed)
[facility] called to let Dr.Scott know that pt has been admitted to hospice and wanted to know if she would remain as her attendee.  Can contact Apolonio Schneiders at [phone]

## 2020-02-27 NOTE — Telephone Encounter (Signed)
Family would like Dr Nicki Reaper to be attending MD

## 2020-02-28 NOTE — Telephone Encounter (Signed)
LM for hospice to call back

## 2020-02-29 ENCOUNTER — Other Ambulatory Visit: Payer: Self-pay | Admitting: Internal Medicine

## 2020-02-29 NOTE — Telephone Encounter (Signed)
Received orders from hospice for you to sign. Placed in quick sign

## 2020-02-29 NOTE — Telephone Encounter (Signed)
Signed and placed in box.   

## 2020-03-01 NOTE — Telephone Encounter (Signed)
Faxed to hospice

## 2020-03-09 ENCOUNTER — Telehealth: Payer: Self-pay | Admitting: Internal Medicine

## 2020-03-09 ENCOUNTER — Other Ambulatory Visit: Payer: Self-pay

## 2020-03-09 MED ORDER — MINOXIDIL 2.5 MG PO TABS: ORAL_TABLET | ORAL | 0 refills | Status: AC

## 2020-03-09 MED ORDER — TORSEMIDE 20 MG PO TABS: 20.0000 mg | ORAL_TABLET | Freq: Every day | ORAL | 1 refills | Status: AC

## 2020-03-09 NOTE — Telephone Encounter (Signed)
[employer] nurse of hospice called want to know if Dr.Scott want pt to continue on the new medications they gave her at the hospital which is torsemide (DEMADEX) 20 MG tablet and minoxidil (LONITEN) 2.5 MG tablet

## 2020-03-09 NOTE — Telephone Encounter (Signed)
Gave verbals per Dr Nicki Reaper for pt to continue meds unless they are having trouble with PO intake  CB number for hospice:  [phone]

## 2020-03-12 ENCOUNTER — Telehealth: Payer: Self-pay | Admitting: Internal Medicine

## 2020-03-12 NOTE — Telephone Encounter (Signed)
Melissa from [facility] is reporting pt's death @ 12:35am. 8/1  She said she had a fast peaceful death. She looked up and said o my and then she passed away.

## 2020-03-12 NOTE — Telephone Encounter (Signed)
FYI

## 2020-03-13 ENCOUNTER — Telehealth: Payer: Self-pay | Admitting: Internal Medicine

## 2020-03-13 NOTE — Telephone Encounter (Signed)
Death Certificate has been dropped off to be signed. Certificate is up front in Dr. Bary Leriche color folder.

## 2020-03-14 NOTE — Telephone Encounter (Signed)
Completed and placed in chart.

## 2020-03-14 NOTE — Telephone Encounter (Signed)
Death certificate placed in quick sign

## 2020-03-14 NOTE — Telephone Encounter (Signed)
PLACED UP FRONT IN FOLDER

## 2020-03-14 NOTE — Telephone Encounter (Signed)
NOTIFIED [facility] THAT DEATH CERTIFICATE IS COMPLETE AND READY FOR PICK UP

## 2020-04-08 IMAGING — CT CT ABD-PELV W/ CM
2 of 5 series · 15 of 46 positions shown, 17 images · IV contrast (APPLIED)
Comparison: 04/02/2018 CT pelvis.  03/01/2018 CT abdomen/pelvis.

CLINICAL DATA: Pelvic pain. Status post right inguinal hernia
repair 03/09/2018 complicated by large postoperative seroma.

EXAM:
CT ABDOMEN AND PELVIS WITH CONTRAST
TECHNIQUE: Multidetector CT imaging of the abdomen and pelvis was performed
using the standard protocol following bolus administration of
intravenous contrast.
CONTRAST:  60mL OMNIPAQUE IOHEXOL 300 MG/ML  SOLN

[Series 2: axial st · axial · 0.58mm/px · z∈[-400,-60]mm · 12 of 78 slices shown, 14 images]
[im 5/78  soft-tissue]
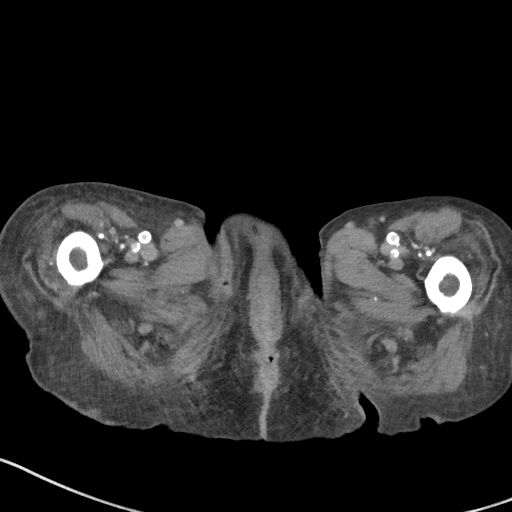
[im 5/78  bone]
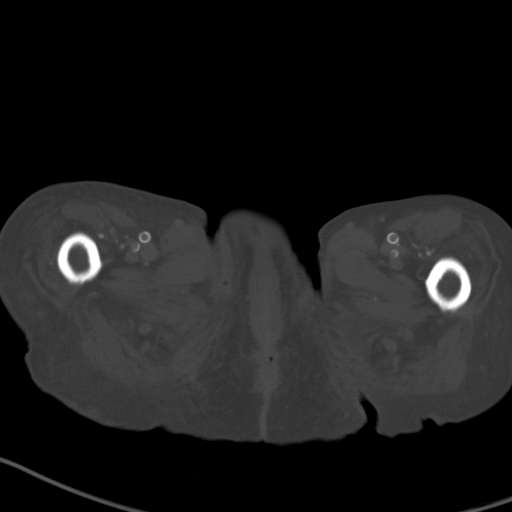
[im 13/78  soft-tissue]
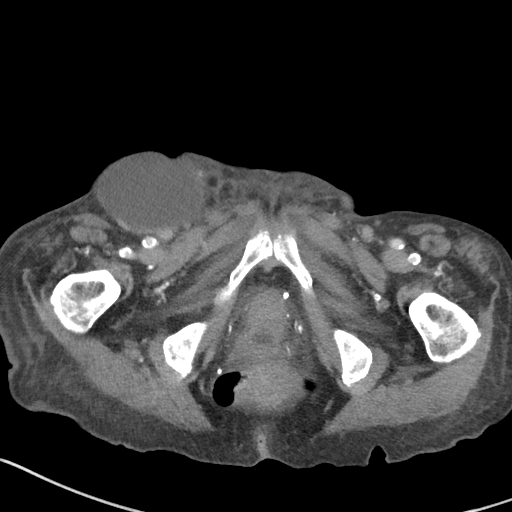
[im 18/78  soft-tissue]
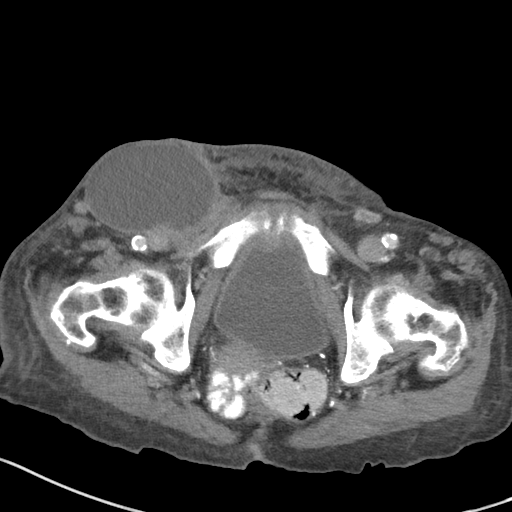
[im 22/78  soft-tissue]
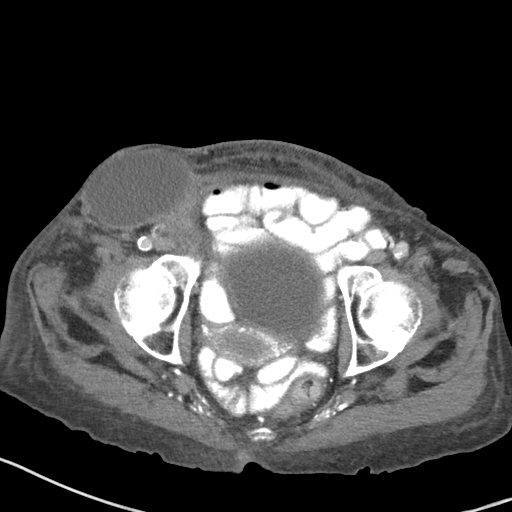
[im 30/78  soft-tissue]
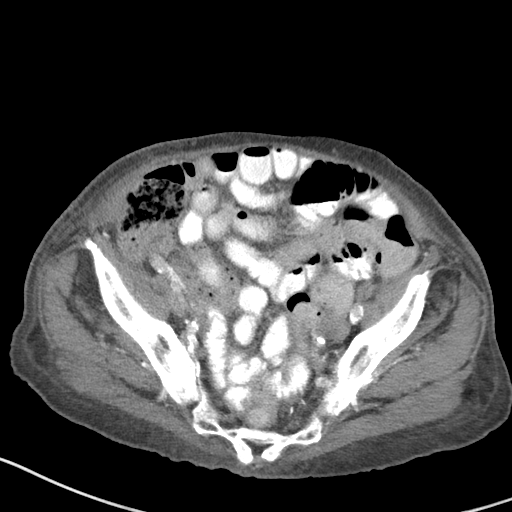
[im 35/78  soft-tissue]
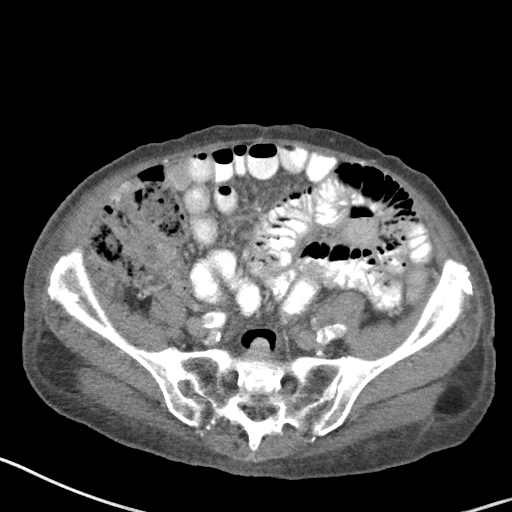
[im 43/78  soft-tissue]
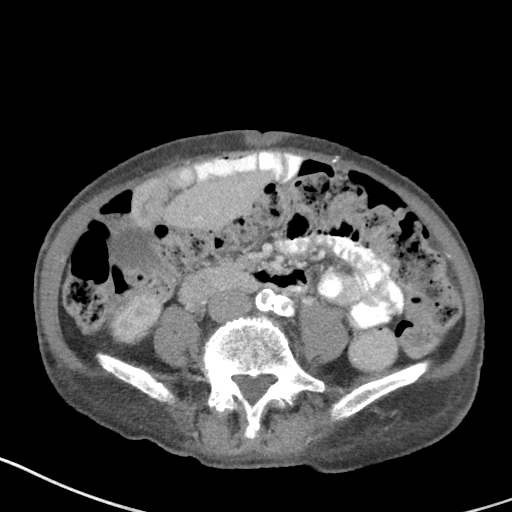
[im 48/78  soft-tissue]
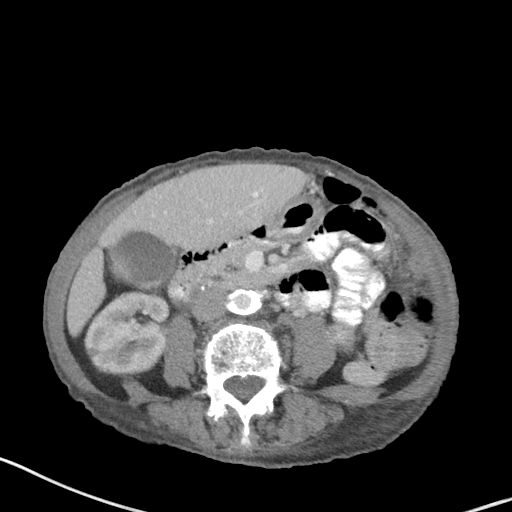
[im 56/78  soft-tissue]
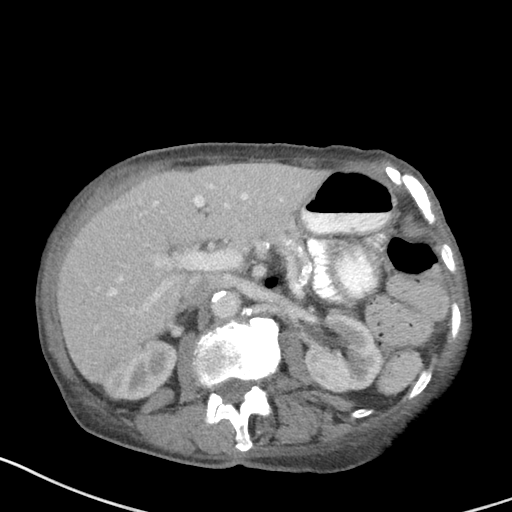
[im 56/78  bone]
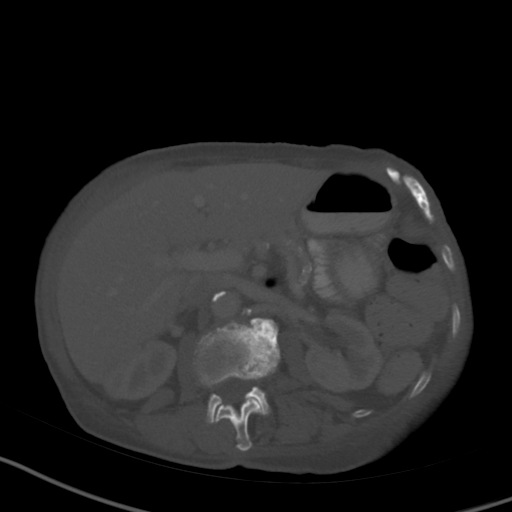
[im 60/78  soft-tissue]
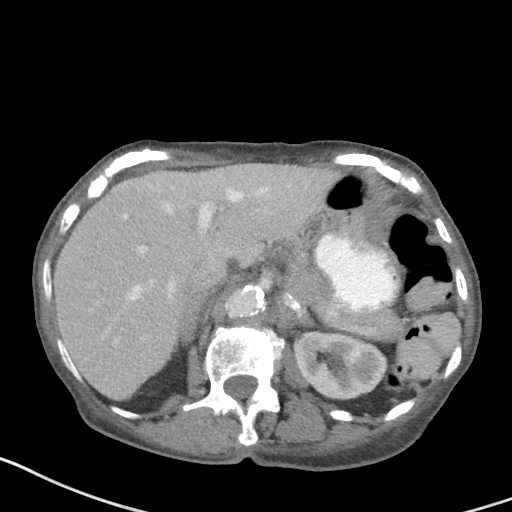
[im 65/78  soft-tissue]
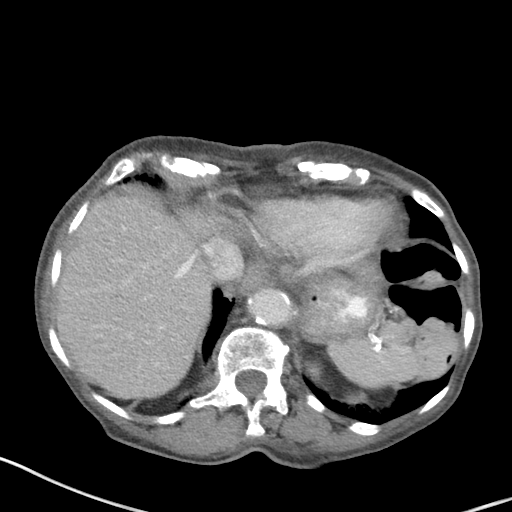
[im 73/78  soft-tissue]
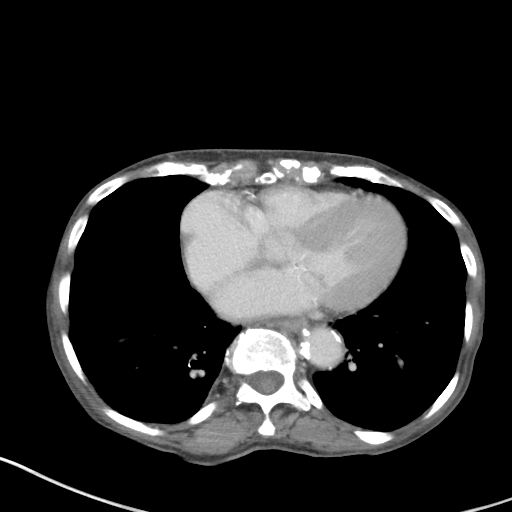

[Series 5: coronal st · coronal · 0.63mm/px · 3 of 69 slices shown]
[im 23/69  soft-tissue]
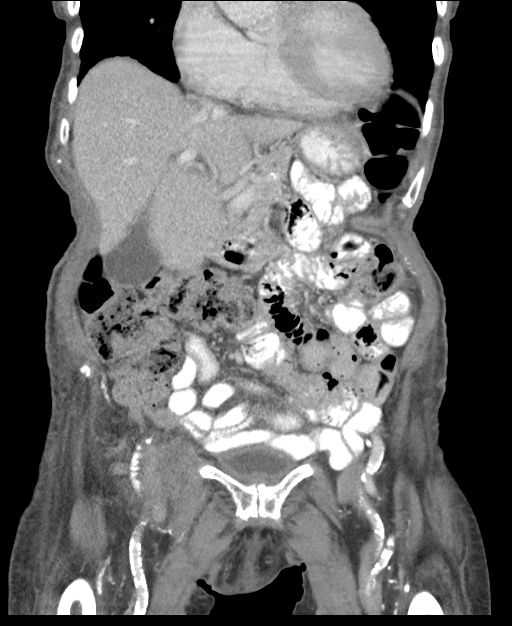
[im 31/69  soft-tissue]
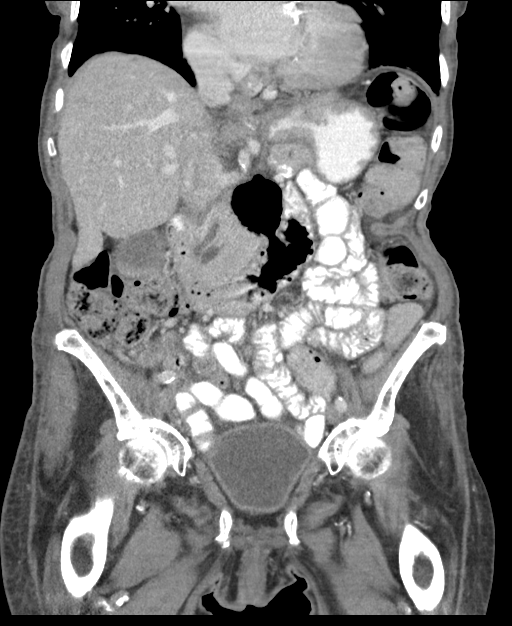
[im 38/69  soft-tissue]
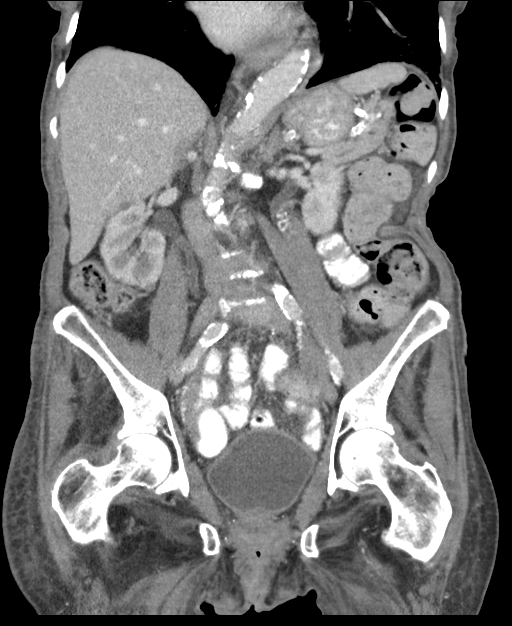

[15 of 46 positions shown; findings below may reference images not displayed]

FINDINGS: Lower chest: No significant pulmonary nodules or acute consolidative
airspace disease. Mild cardiomegaly. Coronary atherosclerosis.

Hepatobiliary: Normal size liver. Scattered subcentimeter hypodense
liver lesions are too small to characterize and unchanged since
03/01/2018. No new liver lesions. Cholelithiasis. No gallbladder
wall thickening or pericholecystic fluid. No biliary ductal
dilatation.

Pancreas: Normal, with no mass or duct dilation.

Spleen: Normal size. No mass.

Adrenals/Urinary Tract: No discrete adrenal nodules. Scattered
subcentimeter hypodense renal cortical lesions are too small to
characterize and require no follow-up. No hydronephrosis. Small
cystocele. Otherwise normal bladder.

Stomach/Bowel: Normal non-distended stomach. Normal caliber small
bowel with no small bowel wall thickening. Oral contrast transits to
the pelvic small bowel. Appendectomy. Moderate to large volume of
stool in the right and transverse colon. No large bowel wall
thickening, significant diverticulosis or significant pericolonic
fat stranding.

Vascular/Lymphatic: Atherosclerotic nonaneurysmal abdominal aorta.
Patent portal, splenic, hepatic and renal veins. No pathologically
enlarged lymph nodes in the abdomen or pelvis.

Reproductive: Grossly normal uterus.  No adnexal mass.

Other: No evidence of a recurrent inguinal hernia. Right inguinal
7.4 x 5.2 cm simple fluid collection (series 2/image 62), previously
7.9 x 4.8 cm, not appreciably changed. No new fluid collections. No
ascites.

Musculoskeletal: No aggressive appearing focal osseous lesions.
Healed deformity in the right inferior pubic ramus. Marked lumbar
spondylosis.
IMPRESSION: 1. No evidence of a recurrent inguinal hernia. Stable large
postoperative fluid collection in the right inguinal region
compatible with seroma.
2. No evidence of bowel obstruction or acute bowel inflammation.
Moderate to large volume stool in the right and transverse colon,
which may indicate constipation.
3. Cholelithiasis.
4. Mild cardiomegaly.
5.  Aortic Atherosclerosis ([id number]).

## 2020-04-11 DEATH — deceased
